# Patient Record
Sex: Male | Born: 1950 | Race: White | Hispanic: No | Marital: Married | Smoking: Former smoker
Health system: Southern US, Community
[De-identification: ages and names within clinical notes are randomized; demographics above are authoritative.]

## PROBLEM LIST (undated history)

## (undated) DIAGNOSIS — E785 Hyperlipidemia, unspecified: Secondary | ICD-10-CM

## (undated) DIAGNOSIS — G709 Myoneural disorder, unspecified: Secondary | ICD-10-CM

## (undated) DIAGNOSIS — L570 Actinic keratosis: Secondary | ICD-10-CM

## (undated) DIAGNOSIS — B351 Tinea unguium: Secondary | ICD-10-CM

## (undated) DIAGNOSIS — A6000 Herpesviral infection of urogenital system, unspecified: Secondary | ICD-10-CM

## (undated) DIAGNOSIS — M1710 Unilateral primary osteoarthritis, unspecified knee: Secondary | ICD-10-CM

## (undated) DIAGNOSIS — Z0000 Encounter for general adult medical examination without abnormal findings: Secondary | ICD-10-CM

## (undated) DIAGNOSIS — M179 Osteoarthritis of knee, unspecified: Secondary | ICD-10-CM

## (undated) DIAGNOSIS — M1990 Unspecified osteoarthritis, unspecified site: Secondary | ICD-10-CM

## (undated) DIAGNOSIS — M25569 Pain in unspecified knee: Secondary | ICD-10-CM

## (undated) DIAGNOSIS — L600 Ingrowing nail: Secondary | ICD-10-CM

## (undated) DIAGNOSIS — R011 Cardiac murmur, unspecified: Secondary | ICD-10-CM

## (undated) DIAGNOSIS — Z860100 Personal history of colon polyps, unspecified: Secondary | ICD-10-CM

## (undated) DIAGNOSIS — D485 Neoplasm of uncertain behavior of skin: Secondary | ICD-10-CM

## (undated) DIAGNOSIS — Z01818 Encounter for other preprocedural examination: Secondary | ICD-10-CM

## (undated) DIAGNOSIS — J019 Acute sinusitis, unspecified: Secondary | ICD-10-CM

## (undated) DIAGNOSIS — K635 Polyp of colon: Secondary | ICD-10-CM

## (undated) DIAGNOSIS — L242 Irritant contact dermatitis due to solvents: Secondary | ICD-10-CM

## (undated) DIAGNOSIS — M79609 Pain in unspecified limb: Secondary | ICD-10-CM

## (undated) DIAGNOSIS — R0609 Other forms of dyspnea: Secondary | ICD-10-CM

## (undated) DIAGNOSIS — T7840XA Allergy, unspecified, initial encounter: Secondary | ICD-10-CM

## (undated) DIAGNOSIS — K219 Gastro-esophageal reflux disease without esophagitis: Secondary | ICD-10-CM

## (undated) HISTORY — DX: Hyperlipidemia, unspecified: E78.5

## (undated) HISTORY — DX: Pain in unspecified knee: M25.569

## (undated) HISTORY — DX: Unilateral primary osteoarthritis, unspecified knee: M17.10

## (undated) HISTORY — DX: Neoplasm of uncertain behavior of skin: D48.5

## (undated) HISTORY — PX: KNEE ARTHROSCOPY: SUR90

## (undated) HISTORY — DX: Irritant contact dermatitis due to solvents: L24.2

## (undated) HISTORY — DX: Ingrowing nail: L60.0

## (undated) HISTORY — DX: Personal history of colonic polyps: Z86.010

## (undated) HISTORY — DX: Other forms of dyspnea: R06.09

## (undated) HISTORY — DX: Cardiac murmur, unspecified: R01.1

## (undated) HISTORY — PX: WISDOM TOOTH EXTRACTION: SHX21

## (undated) HISTORY — DX: Unspecified osteoarthritis, unspecified site: M19.90

## (undated) HISTORY — DX: Encounter for other preprocedural examination: Z01.818

## (undated) HISTORY — DX: Acute sinusitis, unspecified: J01.90

## (undated) HISTORY — DX: Osteoarthritis of knee, unspecified: M17.9

## (undated) HISTORY — DX: Gastro-esophageal reflux disease without esophagitis: K21.9

## (undated) HISTORY — DX: Allergy, unspecified, initial encounter: T78.40XA

## (undated) HISTORY — DX: Encounter for general adult medical examination without abnormal findings: Z00.00

## (undated) HISTORY — DX: Tinea unguium: B35.1

## (undated) HISTORY — DX: Pain in unspecified limb: M79.609

## (undated) HISTORY — DX: Herpesviral infection of urogenital system, unspecified: A60.00

## (undated) HISTORY — DX: Polyp of colon: K63.5

## (undated) HISTORY — DX: Personal history of colon polyps, unspecified: Z86.0100

## (undated) HISTORY — DX: Actinic keratosis: L57.0

---

## 2003-07-23 ENCOUNTER — Encounter: Payer: Self-pay | Admitting: Internal Medicine

## 2003-07-23 ENCOUNTER — Inpatient Hospital Stay (HOSPITAL_COMMUNITY): Admission: EM | Admit: 2003-07-23 | Discharge: 2003-07-24 | Payer: Self-pay

## 2003-07-24 ENCOUNTER — Encounter: Payer: Self-pay | Admitting: Cardiology

## 2003-11-15 HISTORY — PX: FUNCTIONAL ENDOSCOPIC SINUS SURGERY: SUR616

## 2004-10-22 ENCOUNTER — Ambulatory Visit: Payer: Self-pay | Admitting: Internal Medicine

## 2004-10-29 ENCOUNTER — Ambulatory Visit: Payer: Self-pay | Admitting: Internal Medicine

## 2004-12-10 ENCOUNTER — Ambulatory Visit: Payer: Self-pay | Admitting: Gastroenterology

## 2004-12-15 ENCOUNTER — Ambulatory Visit: Payer: Self-pay | Admitting: Gastroenterology

## 2005-08-26 ENCOUNTER — Ambulatory Visit: Payer: Self-pay | Admitting: Internal Medicine

## 2006-01-09 ENCOUNTER — Ambulatory Visit: Payer: Self-pay | Admitting: Internal Medicine

## 2006-01-13 ENCOUNTER — Ambulatory Visit: Payer: Self-pay | Admitting: Internal Medicine

## 2006-02-08 ENCOUNTER — Encounter: Admission: RE | Admit: 2006-02-08 | Discharge: 2006-02-08 | Payer: Self-pay | Admitting: General Surgery

## 2007-02-05 ENCOUNTER — Ambulatory Visit: Payer: Self-pay | Admitting: Internal Medicine

## 2007-02-05 LAB — CONVERTED CEMR LAB
ALT: 38 units/L (ref 0–40)
AST: 27 units/L (ref 0–37)
Albumin: 3.9 g/dL (ref 3.5–5.2)
Alkaline Phosphatase: 52 units/L (ref 39–117)
Bilirubin, Direct: 0.1 mg/dL (ref 0.0–0.3)
Cholesterol: 227 mg/dL (ref 0–200)
Crystals: NEGATIVE
Direct LDL: 120.9 mg/dL
Eosinophils Absolute: 0.3 10*3/uL (ref 0.0–0.6)
Eosinophils Relative: 3.1 % (ref 0.0–5.0)
GFR calc Af Amer: 152 mL/min
GFR calc non Af Amer: 125 mL/min
HDL: 37.3 mg/dL — ABNORMAL LOW (ref >39.0)
Leukocytes, UA: NEGATIVE
Monocytes Relative: 15.2 % — ABNORMAL HIGH (ref 3.0–11.0)
Mucus, UA: NEGATIVE
Neutro Abs: 4.6 10*3/uL (ref 1.4–7.7)
Neutrophils Relative %: 56.6 % (ref 43.0–77.0)
Nitrite: NEGATIVE
Sodium: 144 meq/L (ref 135–145)
Specific Gravity, Urine: 1.02 (ref 1.000–1.03)
TSH: 3.8 microintl units/mL (ref 0.35–5.50)
Total Bilirubin: 0.7 mg/dL (ref 0.3–1.2)
Total Protein: 7.2 g/dL (ref 6.0–8.3)
VLDL: 62 mg/dL — ABNORMAL HIGH (ref 0–40)

## 2007-02-09 ENCOUNTER — Ambulatory Visit: Payer: Self-pay | Admitting: Internal Medicine

## 2007-04-27 ENCOUNTER — Ambulatory Visit: Payer: Self-pay | Admitting: Internal Medicine

## 2007-04-27 LAB — CONVERTED CEMR LAB
BUN: 12 mg/dL (ref 6–23)
CO2: 26 meq/L (ref 19–32)
Creatinine, Ser: 0.8 mg/dL (ref 0.4–1.5)
GFR calc non Af Amer: 107 mL/min
Hgb A1c MFr Bld: 5.4 % (ref 4.6–6.0)
Potassium: 4.2 meq/L (ref 3.5–5.1)
VLDL: 19 mg/dL (ref 0–40)

## 2007-05-04 ENCOUNTER — Ambulatory Visit: Payer: Self-pay | Admitting: Internal Medicine

## 2007-08-28 ENCOUNTER — Encounter: Payer: Self-pay | Admitting: Endocrinology

## 2007-12-13 ENCOUNTER — Encounter (INDEPENDENT_AMBULATORY_CARE_PROVIDER_SITE_OTHER): Payer: Self-pay | Admitting: *Deleted

## 2008-01-25 ENCOUNTER — Encounter: Payer: Self-pay | Admitting: Internal Medicine

## 2008-02-29 ENCOUNTER — Encounter: Payer: Self-pay | Admitting: Internal Medicine

## 2008-05-02 ENCOUNTER — Ambulatory Visit: Payer: Self-pay | Admitting: Internal Medicine

## 2008-05-03 LAB — CONVERTED CEMR LAB
ALT: 25 units/L (ref 0–53)
Alkaline Phosphatase: 44 units/L (ref 39–117)
Basophils Relative: 1.6 % — ABNORMAL HIGH (ref 0.0–1.0)
Bilirubin, Direct: 0.1 mg/dL (ref 0.0–0.3)
Calcium: 9 mg/dL (ref 8.4–10.5)
Chloride: 108 meq/L (ref 96–112)
Creatinine, Ser: 0.8 mg/dL (ref 0.4–1.5)
Eosinophils Relative: 3.7 % (ref 0.0–5.0)
GFR calc Af Amer: 130 mL/min
Glucose, Bld: 118 mg/dL — ABNORMAL HIGH (ref 70–99)
Hgb A1c MFr Bld: 5.6 % (ref 4.6–6.0)
MCHC: 34.3 g/dL (ref 30.0–36.0)
MCV: 94.5 fL (ref 78.0–100.0)
Monocytes Relative: 12.4 % — ABNORMAL HIGH (ref 3.0–12.0)
Platelets: 229 10*3/uL (ref 150–400)
Potassium: 3.7 meq/L (ref 3.5–5.1)
RDW: 12.6 % (ref 11.5–14.6)
TSH: 3.43 microintl units/mL (ref 0.35–5.50)
Total Bilirubin: 0.9 mg/dL (ref 0.3–1.2)
Total CHOL/HDL Ratio: 5.8
VLDL: 23 mg/dL (ref 0–40)

## 2008-05-09 ENCOUNTER — Ambulatory Visit: Payer: Self-pay | Admitting: Internal Medicine

## 2008-05-09 DIAGNOSIS — A6000 Herpesviral infection of urogenital system, unspecified: Secondary | ICD-10-CM | POA: Insufficient documentation

## 2008-05-09 DIAGNOSIS — Z860100 Personal history of colon polyps, unspecified: Secondary | ICD-10-CM

## 2008-05-09 DIAGNOSIS — K219 Gastro-esophageal reflux disease without esophagitis: Secondary | ICD-10-CM

## 2008-05-09 DIAGNOSIS — B351 Tinea unguium: Secondary | ICD-10-CM

## 2008-05-09 HISTORY — DX: Gastro-esophageal reflux disease without esophagitis: K21.9

## 2008-05-09 HISTORY — DX: Herpesviral infection of urogenital system, unspecified: A60.00

## 2008-05-09 HISTORY — DX: Personal history of colon polyps, unspecified: Z86.0100

## 2008-08-07 ENCOUNTER — Encounter: Payer: Self-pay | Admitting: Internal Medicine

## 2008-11-21 ENCOUNTER — Ambulatory Visit: Payer: Self-pay | Admitting: Internal Medicine

## 2008-11-21 DIAGNOSIS — M19042 Primary osteoarthritis, left hand: Secondary | ICD-10-CM

## 2008-11-21 DIAGNOSIS — M19041 Primary osteoarthritis, right hand: Secondary | ICD-10-CM

## 2008-11-21 HISTORY — DX: Primary osteoarthritis, right hand: M19.041

## 2008-11-21 HISTORY — DX: Primary osteoarthritis, left hand: M19.042

## 2009-04-14 ENCOUNTER — Encounter: Payer: Self-pay | Admitting: Internal Medicine

## 2009-05-21 ENCOUNTER — Ambulatory Visit: Payer: Self-pay | Admitting: Internal Medicine

## 2009-05-21 LAB — CONVERTED CEMR LAB
ALT: 37 units/L (ref 0–53)
AST: 27 units/L (ref 0–37)
Alkaline Phosphatase: 51 units/L (ref 39–117)
Basophils Relative: 0 % (ref 0.0–3.0)
Bilirubin Urine: NEGATIVE
Bilirubin, Direct: 0.1 mg/dL (ref 0.0–0.3)
CO2: 26 meq/L (ref 19–32)
Calcium: 9.1 mg/dL (ref 8.4–10.5)
Chloride: 107 meq/L (ref 96–112)
Eosinophils Relative: 5 % (ref 0.0–5.0)
Glucose, Bld: 114 mg/dL — ABNORMAL HIGH (ref 70–99)
HDL: 41.2 mg/dL (ref >39.00)
Ketones, ur: NEGATIVE mg/dL
Leukocytes, UA: NEGATIVE
Lymphocytes Relative: 43.9 % (ref 12.0–46.0)
MCV: 93.2 fL (ref 78.0–100.0)
Monocytes Absolute: 0.5 10*3/uL (ref 0.1–1.0)
Monocytes Relative: 7.9 % (ref 3.0–12.0)
Neutrophils Relative %: 43.2 % (ref 43.0–77.0)
PSA: 0.99 ng/mL (ref 0.10–4.00)
Platelets: 204 10*3/uL (ref 150.0–400.0)
RBC: 4.75 M/uL (ref 4.22–5.81)
Sodium: 140 meq/L (ref 135–145)
Specific Gravity, Urine: <=1.005 (ref 1.000–1.030)
TSH: 3.22 microintl units/mL (ref 0.35–5.50)
Total Bilirubin: 1 mg/dL (ref 0.3–1.2)
Total CHOL/HDL Ratio: 6
Total Protein, Urine: NEGATIVE mg/dL
Triglycerides: 254 mg/dL — ABNORMAL HIGH (ref 0.0–149.0)
Vit D, 25-Hydroxy: 37 ng/mL (ref 30–89)
WBC: 6.1 10*3/uL (ref 4.5–10.5)
pH: 5.5 (ref 5.0–8.0)

## 2009-05-22 ENCOUNTER — Ambulatory Visit: Payer: Self-pay | Admitting: Internal Medicine

## 2009-11-14 HISTORY — PX: UMBILICAL HERNIA REPAIR: SHX196

## 2009-12-21 ENCOUNTER — Ambulatory Visit: Payer: Self-pay | Admitting: Internal Medicine

## 2009-12-21 DIAGNOSIS — M25569 Pain in unspecified knee: Secondary | ICD-10-CM | POA: Insufficient documentation

## 2010-01-28 ENCOUNTER — Ambulatory Visit: Payer: Self-pay | Admitting: Internal Medicine

## 2010-01-28 DIAGNOSIS — L242 Irritant contact dermatitis due to solvents: Secondary | ICD-10-CM | POA: Insufficient documentation

## 2010-06-14 ENCOUNTER — Ambulatory Visit: Payer: Self-pay | Admitting: Internal Medicine

## 2010-06-14 LAB — CONVERTED CEMR LAB
AST: 22 units/L (ref 0–37)
Albumin: 4.1 g/dL (ref 3.5–5.2)
Alkaline Phosphatase: 58 units/L (ref 39–117)
Basophils Relative: 0.4 % (ref 0.0–3.0)
CO2: 27 meq/L (ref 19–32)
Calcium: 9.1 mg/dL (ref 8.4–10.5)
Direct LDL: 104.7 mg/dL
GFR calc non Af Amer: 97.44 mL/min (ref >60)
HCT: 42.4 % (ref 39.0–52.0)
Hemoglobin: 14.6 g/dL (ref 13.0–17.0)
Lymphocytes Relative: 22.3 % (ref 12.0–46.0)
Lymphs Abs: 2.2 10*3/uL (ref 0.7–4.0)
MCHC: 34.5 g/dL (ref 30.0–36.0)
Monocytes Relative: 10.4 % (ref 3.0–12.0)
Neutro Abs: 6.4 10*3/uL (ref 1.4–7.7)
Nitrite: NEGATIVE
PSA: 0.91 ng/mL (ref 0.10–4.00)
Potassium: 4.6 meq/L (ref 3.5–5.1)
RBC: 4.49 M/uL (ref 4.22–5.81)
Sodium: 140 meq/L (ref 135–145)
Specific Gravity, Urine: 1.015 (ref 1.000–1.030)
Total Protein, Urine: NEGATIVE mg/dL
Total Protein: 7 g/dL (ref 6.0–8.3)
pH: 5.5 (ref 5.0–8.0)

## 2010-06-21 ENCOUNTER — Encounter: Payer: Self-pay | Admitting: Internal Medicine

## 2010-06-21 ENCOUNTER — Ambulatory Visit: Payer: Self-pay | Admitting: Internal Medicine

## 2010-06-21 DIAGNOSIS — J019 Acute sinusitis, unspecified: Secondary | ICD-10-CM | POA: Insufficient documentation

## 2010-06-21 DIAGNOSIS — D485 Neoplasm of uncertain behavior of skin: Secondary | ICD-10-CM | POA: Insufficient documentation

## 2010-06-21 DIAGNOSIS — L570 Actinic keratosis: Secondary | ICD-10-CM | POA: Insufficient documentation

## 2010-06-21 HISTORY — DX: Acute sinusitis, unspecified: J01.90

## 2010-08-06 ENCOUNTER — Ambulatory Visit: Payer: Self-pay | Admitting: Internal Medicine

## 2010-11-22 ENCOUNTER — Telehealth: Payer: Self-pay | Admitting: Internal Medicine

## 2010-12-14 NOTE — Miscellaneous (Signed)
Summary: Skin Bx/[facility]  Skin Bx/[facility]   Imported By: Sherian Rein 08/10/2010 08:58:14  _____________________________________________________________________  External Attachment:    Type:   Image     Comment:   External Document

## 2010-12-14 NOTE — Assessment & Plan Note (Signed)
Summary: PHYSICAL--STC   Vital Signs:  Patient profile:   60 year old male Height:      70 inches Weight:      219 pounds BMI:     31.54 O2 Sat:      96 % on Room air Temp:     98.1 degrees F oral Pulse rate:   81 / minute Pulse rhythm:   regular Resp:     16 per minute BP sitting:   130 / 76  (left arm) Cuff size:   regular  Vitals Entered By: Lanier Prude, CMA(AAMA) (June 21, 2010 3:01 PM)  O2 Flow:  Room air  Primary Care Provider:  Tresa Garter MD   History of Present Illness: The patient presents for a preventive health examination  C/o sinus inf and pain  Current Medications (verified): 1)  Nasonex 50 Mcg/act  Susp (Mometasone Furoate) .... Use As Directed 2)  Celebrex 200 Mg  Caps (Celecoxib) .[name].. 1-2 Once Daily  With Food 3)  Aspirin 81 Mg  Tbec (Aspirin) .... One By Mouth Every Day 4)  Vitamin D3 1000 Unit  Tabs (Cholecalciferol) .... 2 By Mouth Daily 5)  Tramadol Hcl 50 Mg  Tabs (Tramadol Hcl) .[name].. 1-2 By Mouth Two Times A Day As Needed Pain 6)  Valtrex 500 Mg Tabs (Valacyclovir Hcl) .[name].. 1 By Mouth Qd 7)  Cyclobenzaprine Hcl 10 Mg Tabs (Cyclobenzaprine Hcl) .... 1/2-1 Tab By Mouth Two Times A Day As Needed Muscle Spasms 8)  Clobetasol Propionate E 0.05 % Crea (Clobetasol Prop Emollient Base) .... Apply To Aa Two Times A Day As Needed For Rash and Itching 9)  Zyrtec Allergy 10 Mg Tbdp (Cetirizine Hcl) .... 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  The patient denies anorexia, fever, weight loss, weight gain, vision loss, decreased hearing, hoarseness, chest pain, syncope, dyspnea on exertion, peripheral edema, prolonged cough, headaches, hemoptysis, abdominal pain, melena, hematochezia, severe indigestion/heartburn, hematuria, incontinence, genital sores, muscle weakness, suspicious skin lesions, transient blindness, depression, unusual weight change, abnormal bleeding, enlarged lymph nodes, angioedema, and testicular masses.         L knee pain >R  Physical Exam  General:  Well-developed,well-nourished,in no acute distress; alert,appropriate and cooperative throughout examination Head:  Normocephalic and atraumatic without obvious abnormalities. No apparent alopecia or balding. Eyes:  No corneal or conjunctival inflammation noted. EOMI. Perrla.  Ears:  External ear exam shows no significant lesions or deformities.  Otoscopic examination reveals clear canals, tympanic membranes are intact bilaterally without bulging, retraction, inflammation or discharge. Hearing is grossly normal bilaterally. Nose:  External nasal examination shows no deformity or inflammation. Nasal mucosa are pink and moist without lesions or exudates. Mouth:  Oral mucosa and oropharynx without lesions or exudates.  Teeth in good repair. Neck:  No deformities, masses, or tenderness noted. Lungs:  Normal respiratory effort, chest expands symmetrically. Lungs are clear to auscultation, no crackles or wheezes. Heart:  Normal rate and regular rhythm. S1 and S2 normal without gallop, murmur, click, rub or other extra sounds. Abdomen:  Bowel sounds positive,abdomen soft and non-tender without masses, organomegaly or hernias noted. Rectal:  No external abnormalities noted. Normal sphincter tone. No rectal masses or tenderness. G (-) Genitalia:  Testes bilaterally descended without nodularity,  tenderness or masses. No scrotal masses or lesions. No penis lesions or urethral discharge. Prostate:  no nodules and 1+ enlarged.   Msk:  B Knees are puffy and with OA deformities, tender w/ROM   no other  joint tenderness, no joint swelling, no joint warmth, no redness over joints, no joint deformities, no joint instability, and no crepitation.   Pulses:  R and L carotid,radial,femoral,dorsalis pedis and posterior tibial pulses are full and equal bilaterally Extremities:  No clubbing, cyanosis, edema, or deformity noted with normal full range of motion of all joints.   Neurologic:  No cranial nerve deficits noted. Station and gait are normal. Plantar reflexes are down-going bilaterally. DTRs are symmetrical throughout. Sensory, motor and coordinative functions appear intact. Skin:  mole 3 mm w/irreg color L thigh R hand 4 mm dry AK Cervical Nodes:  no anterior cervical adenopathy and no posterior cervical adenopathy.   Inguinal Nodes:  No significant adenopathy Psych:  Cognition and judgment appear intact. Alert and cooperative with normal attention span and concentration. No apparent delusions, illusions, hallucinations   Impression & Recommendations:  Problem # 1:  WELL ADULT EXAM (ICD-V70.0) Assessment New Health and age related issues were discussed. Available screening tests and vaccinations were discussed as well. Healthy life style including good diet and execise was discussed.  Orders: EKG w/ Interpretation (93000) He will sch a colon test in [place] as before The labs were reviewed with the patient.  Vaccinations needed discussed, he will think over  Problem # 2:  KNEE PAIN (ICD-719.46) L>R Assessment: Unchanged  His updated medication list for this problem includes:    Celebrex 200 Mg Caps (Celecoxib) .[name]... 1-2 once daily  with food    Aspirin 81 Mg Tbec (Aspirin) ..... One by mouth every day    Tramadol Hcl 50 Mg Tabs (Tramadol hcl) .[name]... 1-2 by mouth two times a day as needed  pain    Cyclobenzaprine Hcl 10 Mg Tabs (Cyclobenzaprine hcl) .[name]... 1/2-1 tab by mouth two times a day as needed muscle spasms  Problem # 3:  SINUSITIS, ACUTE (ICD-461.9) Assessment: New  His updated medication list for this problem includes:    Nasonex 50 Mcg/act Susp (Mometasone furoate) ..... Use as directed    Zithromax Z-pak 250 Mg Tabs (Azithromycin) .[name]... As dirrected  Problem # 4:  NEOPLASM OF UNCERTAIN BEHAVIOR OF SKIN (ICD-238.2) L thigh Assessment: New skin biopsy   Problem # 5:  ACTINIC KERATOSIS (ICD-702.0) R hand Assessment: New  Orders: Cryotherapy/Destruction benign or premalignant lesion (1st lesion)  (17000)  Problem # 6:  OSTEOARTHRITIS (ICD-715.90) Assessment: Unchanged  His updated medication list for this problem includes:    Celebrex 200 Mg Caps (Celecoxib) .[name]... 1-2 once daily  with food    Aspirin 81 Mg Tbec (Aspirin) ..... One by mouth every day    Tramadol Hcl 50 Mg Tabs (Tramadol hcl) .[name]... 1-2 by mouth two times a day as needed pain  Problem # 7:  GERD (ICD-530.81) Assessment: Unchanged  Complete Medication List: 1)  Nasonex 50 Mcg/act Susp (Mometasone furoate) .... Use as directed 2)  Celebrex 200 Mg Caps (Celecoxib) .[name].. 1-2 once daily  with food 3)  Aspirin 81 Mg Tbec (Aspirin) .... One by mouth every day 4)  Vitamin D3 1000 Unit Tabs (Cholecalciferol) .... 2 by mouth daily 5)  Tramadol Hcl 50 Mg Tabs (Tramadol hcl) .[name].. 1-2 by mouth two times a day as needed pain 6)  Valtrex 500 Mg Tabs (Valacyclovir hcl) .[name].. 1 by mouth qd 7)  Cyclobenzaprine Hcl 10 Mg Tabs (Cyclobenzaprine hcl) .... 1/2-1 tab by mouth two times a day as needed muscle spasms 8)  Clobetasol Propionate E 0.05 % Crea (Clobetasol prop emollient base) .... Apply to aa two times a day as needed for rash and itching 9)  Zyrtec Allergy 10 Mg Tbdp (Cetirizine hcl) .... One by mouth once daily for itching 10)  Zithromax Z-pak 250 Mg Tabs (Azithromycin) .... As dirrected 11)  Prednisone  10 Mg Tabs (Prednisone) .... Take 40mg  qd for 3 days, then 20 mg qd for 3 days, then 10mg  qd for 6 days, then stop. take pc.  Patient Instructions: 1)  Skin biopsy wiith me 1-2 months  2)  Please schedule a follow-up appointment in 6 months. Prescriptions: CYCLOBENZAPRINE HCL 10 MG TABS (CYCLOBENZAPRINE HCL) 1/2-1 tab by mouth two times a day as needed muscle spasms  #60 x 3   Entered and Authorized by:   Tresa Garter MD   Signed by:   Tresa Garter MD on 06/21/2010   Method used:   Electronically to        [facility]  [address] [phone]* (retail)       [address]       [place], [place]  [address]       Ph: [phone] or [phone]       Fax: [phone]   RxID:   [id number] VALTREX 500 MG TABS (VALACYCLOVIR HCL) 1 by mouth qd  #90 x 3   Entered and Authorized by:   Tresa Garter MD   Signed by:   Tresa Garter MD on 06/21/2010   Method used:   Electronically to        [facility]  [address] [phone]* (retail)       [address]       [place], [place]  [address]       Ph: [phone] or [phone]       Fax: [phone]   RxID:   [id number] TRAMADOL HCL 50 MG  TABS (TRAMADOL HCL) 1-2 by mouth two times a day as needed pain  #120 Tablet x 4   Entered and Authorized by:   Tresa Garter MD   Signed by:   Tresa Garter MD on 06/21/2010   Method used:   Electronically to        [facility]  [address] [phone]* (retail)       [address]       [place], [place]  [address]       Ph: [phone] or [phone]       Fax: [phone]   RxID:   [id number] CELEBREX 200 MG  CAPS (CELECOXIB) 1-2 once daily  with food  #60 x 6   Entered and Authorized by:   Tresa Garter MD   Signed by:   Tresa Garter MD on 06/21/2010   Method used:   Electronically to        [facility]  [address] [phone]* (retail)       [address]       [place], [place]  [address]       Ph: [phone] or [phone]        Fax: [phone]   RxID:   [id number] NASONEX 50 MCG/ACT  SUSP (MOMETASONE FUROATE) USE AS DIRECTED  #1 x 12   Entered and Authorized by:   Tresa Garter MD   Signed by:   Tresa Garter MD on 06/21/2010   Method used:   Electronically to        [facility]  [address] [phone]* (retail)       [address]       [place], [place]  [address]       Ph: [phone] or [phone]       Fax: [phone]   RxID:   [id number] PREDNISONE 10 MG TABS (PREDNISONE) Take 40mg  qd for 3 days, then 20 mg qd for 3 days, then 10mg  qd for 6 days, then stop. Take pc.  #24 x 1   Entered and Authorized by:   Tresa Garter MD   Signed by:   Tresa Garter MD on 06/21/2010   Method used:   Electronically to        [facility]  [address] [phone]* (retail)       [address]       [place], [place]  [address]       Ph: [phone] or [phone]       Fax: [phone]   RxID:   [id number] ZITHROMAX Z-PAK 250 MG TABS (AZITHROMYCIN) as dirrected  #1 x 0   Entered and Authorized by:   Tresa Garter MD   Signed by:   Tresa Garter MD on 06/21/2010   Method used:   Electronically to        [facility]  [address] [phone]* (retail)       [address]       [place], [place]  [address]       Ph: [phone] or [phone]       Fax: [phone]   RxID:   [id number]

## 2010-12-14 NOTE — Assessment & Plan Note (Signed)
Summary: rash on arms,hands/plot pt/cd   Vital Signs:  Patient profile:   60 year old male Height:      70 inches (177.80 cm) Weight:      220 pounds (100.00 kg) O2 Sat:      96 % on Room air Temp:     97.1 degrees F (36.17 degrees C) oral Pulse rate:   92 / minute Pulse rhythm:   regular Resp:     16 per minute BP sitting:   114 / 68  (left arm) Cuff size:   large  Vitals Entered By: Rock Nephew CMA (January 28, 2010 8:16 AM) Taken by Sydell Axon SMA  O2 Flow:  Room air CC: Pt c/o rash on arms and hands   Primary Care Provider:  Tresa Garter MD  CC:  Pt c/o rash on arms and hands.  History of Present Illness: New to me he complains of chronic but worsening itchy rash on both hands and forearms that is in an area that matches with the location of a pair of Nitrile gloves that he wears at work.  Current Medications (verified): 1)  Nasonex 50 Mcg/act  Susp (Mometasone Furoate) .... Use As Directed 2)  Celebrex 200 Mg  Caps (Celecoxib) .[name].. 1-2 Once Daily  With Food 3)  Aspirin 81 Mg  Tbec (Aspirin) .... One By Mouth Every Day 4)  Vitamin D3 1000 Unit  Tabs (Cholecalciferol) .... 2 By Mouth Daily 5)  Tramadol Hcl 50 Mg  Tabs (Tramadol Hcl) .[name].. 1-2 By Mouth Two Times A Day As Needed Pain 6)  Valtrex 500 Mg Tabs (Valacyclovir Hcl) .[name].. 1 By Mouth Qd 7)  Prednisone 10 Mg  Tabs (Prednisone) .... Take 40mg  Qd For 3 Days, Then 20 Mg Qd For 3 Days, Then 10mg  Qd For 6 Days, Then Stop. Take Pc. 8)  Cyclobenzaprine Hcl 10 Mg Tabs (Cyclobenzaprine Hcl) .... 1/2-1 Tab By Mouth Two Times A Day As Needed Muscle Spasms  Allergies (verified): No Known Drug Allergies  Past History:  Past Medical History: Reviewed history from 11/21/2008 and no changes required. 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 L knee arthrosc.  Family  History: Reviewed history from 05/09/2008 and no changes required. Family History of CAD Male 1st degree relative <50   Social History: Reviewed history from 05/09/2008 and no changes required. 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   Derm:  Complains of dryness, itching, and rash; denies changes in color of skin, changes in nail beds, flushing, hair loss, insect bite(s), lesion(s), and poor wound healing.  Physical Exam  General:  Well-developed,well-nourished,in no acute distress; alert,appropriate and cooperative throughout examination Eyes:  No corneal or conjunctival inflammation noted. EOMI. Perrla. Funduscopic exam benign, without hemorrhages, exudates or papilledema. Vision grossly normal. Mouth:  Oral mucosa and oropharynx without lesions or exudates.  Teeth in good repair. Neck:  No deformities, masses, or tenderness noted. Lungs:  Normal respiratory effort, chest expands symmetrically. Lungs are clear to auscultation, no crackles or wheezes. Heart:  Normal rate and regular rhythm. S1 and S2 normal without gallop, murmur, click, rub or other extra sounds. Abdomen:  Bowel sounds positive,abdomen soft and non-tender without masses, organomegaly or hernias noted. Msk:  normal ROM, no joint tenderness, no joint swelling, no joint warmth, no redness over joints, no joint deformities, no joint instability, and no crepitation.   Pulses:  R and L carotid,radial,femoral,dorsalis pedis and posterior tibial pulses are full and equal bilaterally Extremities:  No clubbing, cyanosis, edema, or deformity noted with normal full range of motion of all joints.   Neurologic:  No cranial nerve deficits noted. Station and gait are normal. Plantar reflexes are down-going bilaterally. DTRs are  symmetrical throughout. Sensory, motor and coordinative functions appear intact. Skin:  he has a rash that is symmetrical on both hands and forearms that matches the distribution of a pair of gloves that he wears at work. there is lichenification, erythema, scale, and xerosis. there is no exudate, induration, streaking, or fluctuance. Cervical Nodes:  no anterior cervical adenopathy and no posterior cervical adenopathy.   Psych:  Cognition and judgment appear intact. Alert and cooperative with normal attention span and concentration. No apparent delusions, illusions, hallucinations   Impression & Recommendations:  Problem # 1:  CONTACT DERMATITIS DUE TO SOLVENTS (ICD-692.2) Assessment New change gloves to prevent re-exposue The following medications were removed from the medication list:    Prednisone 10 Mg Tabs (Prednisone) .[name]... Take 40mg  qd for 3 days, then 20 mg qd for 3 days, then 10mg  qd for 6 days, then stop. take pc. His updated medication list for this problem includes:    Clobetasol Propionate E 0.05 % Crea (Clobetasol prop emollient base) .[name]... Apply to aa two times a day as needed for rash and itching    Zyrtec Allergy 10 Mg Tbdp (Cetirizine hcl) ..... One by mouth once daily for itching  Orders: Admin of Therapeutic Inj  intramuscular or subcutaneous ([address]109) Depo- Medrol 40mg  (J1030) Depo- Medrol 80mg  (J1040)  Discussed avoidance of triggers and symptomatic treatment.   Complete Medication List: 1)  Nasonex 50 Mcg/act Susp (Mometasone furoate) .... Use as directed 2)  Celebrex 200 Mg Caps (Celecoxib) .[name].. 1-2 once daily  with food 3)  Aspirin 81 Mg Tbec (Aspirin) .... One by mouth every day 4)  Vitamin D3 1000 Unit Tabs (Cholecalciferol) .... 2 by mouth daily 5)  Tramadol Hcl 50 Mg Tabs (Tramadol hcl) .[name].. 1-2 by mouth two times a day as needed pain 6)  Valtrex 500 Mg Tabs (Valacyclovir hcl) .[name].. 1 by mouth qd 7)  Cyclobenzaprine Hcl 10 Mg Tabs (Cyclobenzaprine hcl) ....  1/2-1 tab by mouth two times a day as needed muscle spasms 8)  Clobetasol Propionate E 0.05 % Crea (Clobetasol prop emollient base) .... Apply to aa two times a day as needed for rash and itching 9)  Zyrtec Allergy 10 Mg Tbdp (Cetirizine hcl) .... One by mouth once daily for itching  Other Orders: Tdap => 10yrs IM (60454) Admin 1st Vaccine (09811)  Patient Instructions: 1)  Please talk to your employer about geeting a pair of gloves that you are not aalregic to. 2)  Please schedule a follow-up appointment in 1 month. Prescriptions: ZYRTEC ALLERGY 10 MG TBDP (CETIRIZINE HCL) One by mouth once daily for itching  #30 x 11   Entered and Authorized by:   Etta Grandchild MD   Signed by:   Etta Grandchild MD on 01/28/2010   Method used:   [facility]ron[facility] to        [facility]  [address] [phone]* (retail)       [address] [facility] [facility]  [place], [place]  [address]       Ph: [phone] or [phone]       Fax: [phone]   RxID:   [id number] CLOBETASOL PROPIONATE E 0.05 % CREA (CLOBETASOL PROP EMOLLIENT BASE) Apply to AA two times a day as needed for rash and itching  #60 gms x 11   Entered and Authorized by:   Etta Grandchild MD   Signed by:   Etta Grandchild MD on 01/28/2010   Method used:   [facility]        [facility]  [address] [phone]* (retail)       [address]       [place], [place]  [address]       Ph: [phone] or [phone]       Fax: [phone]   RxID:   [id number]    Immunizations Administered:  Tetanus Vaccine:    Vaccine Type: Tdap    Site: right deltoid    Mfr: GlaxoSmithKline    Dose: 0.5 ml    Route: IM    Given by: Rock Nephew CMA    Exp. Date: 09/14/2012    Lot #: [id number]    VIS given: 10/02/07 version given January 28, 2010.  EXP:02/06/2012 LOT#[id number]  Medication Administration  Injection # 1:    Medication: Depo- Medrol 80mg     Diagnosis: CONTACT DERMATITIS DUE TO SOLVENTS (ICD-692.2)    Route: IM    Site: L  deltoid    Exp Date: 02/06/2012    Lot #: [id number]    Mfr: [email or web address]    Patient tolerated injection without complications    Given by: Rock Nephew CMA (January 28, 2010 8:35 AM)  Injection # 2:    Medication: Depo- Medrol 40mg     Diagnosis: CONTACT DERMATITIS DUE TO SOLVENTS (ICD-692.2)    Route: IM    Site: L deltoid    Exp Date: 02/06/2012    Lot #: [id number]    Mfr: PFIZER    Patient tolerated injection without complications    Given by: Rock Nephew CMA (January 28, 2010 8:36 AM)  Orders Added: 1)  Tdap => 100yrs IM [90715] 2)  Admin 1st Vaccine [90471] 3)  Admin of Therapeutic Inj  intramuscular or subcutaneous [96372] 4)  Depo- Medrol 40mg  [J1030] 5)  Depo- Medrol 80mg  [J1040] 6)  Est. Patient Level IV [99371]

## 2010-12-14 NOTE — Assessment & Plan Note (Signed)
Summary: 6 mos f/u $50/cd   Vital Signs:  Patient profile:   60 year old male Weight:      222 pounds Temp:     98.3 degrees F oral Pulse rate:   84 / minute BP sitting:   124 / 80  (left arm)  Vitals Entered By: Tora Perches (December 21, 2009 2:29 PM) CC: f/u Is Patient Diabetic? No   CC:  f/u.  History of Present Illness: F/u OA, knee pain  Preventive Screening-Counseling & Management  Alcohol-Tobacco     Smoking Status: current  Current Medications (verified): 1)  Nasonex 50 Mcg/act  Susp (Mometasone Furoate) .... Use As Directed 2)  Celebrex 200 Mg  Caps (Celecoxib) .[name].. 1-2 Once Daily  With Food 3)  Aspirin 81 Mg  Tbec (Aspirin) .... One By Mouth Every Day 4)  Vitamin D3 1000 Unit  Tabs (Cholecalciferol) .... 2 By Mouth Daily 5)  Tramadol Hcl 50 Mg  Tabs (Tramadol Hcl) .[name].. 1-2 By Mouth Two Times A Day As Needed Pain 6)  Valtrex 500 Mg Tabs (Valacyclovir Hcl) .[name].. 1 By Mouth Qd 7)  Prednisone 10 Mg  Tabs (Prednisone) .... Take 40mg  Qd For 3 Days, Then 20 Mg Qd For 3 Days, Then 10mg  Qd For 6 Days, Then Stop. Take Pc. 8)  Bromax D 6-19 Mg Xr12h-Tab (Brompheniramine-Phenylephrine) .... Two Times A Day 9)  Methylprednisolone 4 Mg Tabs (Methylprednisolone) .... As Directed 10)  Azithromycin 250 Mg Tabs (Azithromycin) .... As Directed  Allergies (verified): No Known Drug Allergies  Past History:  Past Medical History: Last updated: 11/21/2008 OA Dr Penni Bombard - knees L>R Colonic polyps, hx of - colonosc in [place] GERD Osteoarthritis  Past Surgical History: Last updated: 11/21/2008 L knee arthrosc.  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 Nasal mucosa are pink and moist without lesions or exudates. Mouth:  Oral mucosa and oropharynx without lesions or exudates.  Teeth in good repair. Lungs:  Normal respiratory effort, chest expands symmetrically. Lungs are clear to auscultation, no crackles or wheezes. Heart:  Normal rate and regular rhythm. S1 and S2 normal without gallop, murmur, click, rub or other extra sounds. Abdomen:  Bowel sounds positive,abdomen soft and non-tender without masses, organomegaly or hernias noted. Msk:  L knee tender w/ROM Extremities:  No clubbing, cyanosis, edema, or deformity noted with normal full range of motion of all joints.   Neurologic:  No cranial nerve deficits noted. Station and gait are normal. Plantar reflexes are down-going bilaterally. DTRs are symmetrical throughout. Sensory, motor and coordinative functions appear intact. Skin:  Intact without suspicious lesions or rashes Psych:  Cognition and judgment appear intact. Alert and cooperative with normal attention span and concentration. No apparent delusions, illusions, hallucinations   Impression & Recommendations:  Problem # 1:  OSTEOARTHRITIS (ICD-715.90) Assessment Unchanged  His updated medication list for this problem includes:    Celebrex 200 Mg Caps (Celecoxib) .[name]... 1-2 once daily  with food    Aspirin 81 Mg Tbec (Aspirin) ..... One by mouth every day    Tramadol Hcl 50 Mg Tabs (Tramadol hcl) .[name]... 1-2 by mouth two times a day as needed pain  Problem # 2:  KNEE PAIN (ICD-719.46) Assessment: Unchanged  His updated medication list for this problem includes:    Celebrex 200 Mg Caps (Celecoxib) .[name]... 1-2 once daily  with food    Aspirin 81 Mg Tbec (Aspirin) ..... One by mouth every day    Tramadol Hcl 50 Mg Tabs (Tramadol hcl) .[name]... 1-2 by mouth two times a day as needed pain    Cyclobenzaprine Hcl 10 Mg Tabs (Cyclobenzaprine hcl) .[name]... 1/2-1 tab by mouth two times a day as  needed muscle spasms  Problem # 3:  GERD (ICD-530.81) Assessment: Improved  Complete Medication List: 1)  Nasonex 50 Mcg/act Susp (Mometasone furoate) .... Use as directed 2)  Celebrex 200 Mg Caps (Celecoxib) .[name].. 1-2 once daily  with food 3)  Aspirin 81 Mg Tbec (Aspirin) .... One by mouth every day 4)  Vitamin D3 1000 Unit Tabs (Cholecalciferol) .... 2 by mouth daily 5)  Tramadol Hcl 50 Mg Tabs (Tramadol hcl) .[name].. 1-2 by mouth two times a day as needed pain 6)  Valtrex 500 Mg Tabs (Valacyclovir hcl) .[name].. 1 by mouth qd 7)  Prednisone 10 Mg Tabs (Prednisone) .... Take 40mg  qd for 3 days, then 20 mg qd for 3 days, then 10mg  qd for 6 days, then stop. take pc. 8)  Azithromycin 250 Mg Tabs (Azithromycin) .... As directed 9)  Meclizine Hcl 12.5 Mg Tabs (Meclizine hcl) .[name].. 1-2 by mouth two times a day as needed vertigo 10)  Cyclobenzaprine Hcl 10 Mg Tabs (Cyclobenzaprine hcl) .... 1/2-1 tab by mouth two times a day as needed muscle spasms  Patient Instructions: 1)  Use the Sinus rinse as needed 2)  Please schedule a follow-up appointment in 6 months well w/labs. Prescriptions: CYCLOBENZAPRINE HCL 10 MG TABS (CYCLOBENZAPRINE HCL) 1/2-1 tab by mouth two times a day as needed muscle spasms  #60 x 3   Entered and Authorized by:   Tresa Garter MD   Signed by:   Tresa Garter MD on 12/21/2009   Method used:   Electronically to        [facility]  [address] [phone]* (retail)       [address]       [place], [place]  [address]       Ph: [phone] or [phone]       Fax: [phone]   RxID:   [id number] VALTREX 500 MG TABS (VALACYCLOVIR HCL) 1 by mouth qd  #90 x 3   Entered and Authorized by:   Tresa Garter MD   Signed by:   Tresa Garter MD on 12/21/2009   Method used:   Electronically to        [facility]  [address] [phone]* (retail)       [address]       [place], [place]  [address]       Ph: [phone] or [phone]       Fax:  [phone]   RxID:   [id number] TRAMADOL HCL 50 MG  TABS (TRAMADOL HCL) 1-2 by mouth two times a day as needed pain  #120 Tablet x 4   Entered and Authorized by:   Tresa Garter MD   Signed by:   Tresa Garter MD on 12/21/2009   Method used:   Electronically to        [facility]  [address] [phone]* (retail)       [address]  [place], [place]  [address]       Ph: [phone] or [phone]       Fax: [phone]   RxID:   [id number] CELEBREX 200 MG  CAPS (CELECOXIB) 1-2 once daily  with food  #60 x 6   Entered and Authorized by:   Tresa Garter MD   Signed by:   Tresa Garter MD on 12/21/2009   Method used:   Electronically to        [facility]  [address] [phone]* (retail)       [address]       [place], [place]  [address]       Ph: [phone] or [phone]       Fax: [phone]   RxID:   [id number] NASONEX 50 MCG/ACT  SUSP (MOMETASONE FUROATE) USE AS DIRECTED  #1 x 12   Entered and Authorized by:   Tresa Garter MD   Signed by:   Tresa Garter MD on 12/21/2009   Method used:   Electronically to        [facility]  [address] [phone]* (retail)       [address]       [place], [place]  [address]       Ph: [phone] or [phone]       Fax: [phone]   RxID:   [id number] MECLIZINE HCL 12.5 MG TABS (MECLIZINE HCL) 1-2 by mouth two times a day as needed vertigo  #60 x 3   Entered and Authorized by:   Tresa Garter MD   Signed by:   Tresa Garter MD on 12/21/2009   Method used:   [facility]ronically to        [facility]  [address] [phone]* (retail)       [address]       [place], [place]  [address]       Ph: [phone] or [phone]       Fax: [phone]   RxID:   [id number]

## 2010-12-14 NOTE — Assessment & Plan Note (Signed)
Summary: SKIN BIOPSY-LB   Vital Signs:  Patient profile:   60 year old male Height:      70 inches Weight:      220 pounds BMI:     31.68 Temp:     98.6 degrees F oral Pulse rate:   84 / minute Pulse rhythm:   regular Resp:     16 per minute BP sitting:   130 / 80  (left arm) Cuff size:   regular  Vitals Entered By: Lanier Prude, Beverly Gust) (August 06, 2010 1:13 PM)  Procedure Note Last Tetanus: Tdap (01/28/2010)  Biopsy: The patient complains of changing mole. Consent signed: yes  Procedure # 1: shave biopsy    Size (in cm): 0.3 x 0.4    Region: anterior    Location: L upper thigh    Comment: Risks including but not limited by incomplete procedure, bleeding, infection, recurrence were discussed with the patient. Consent form was signed.     Instrument used: dermablade    Anesthesia: 0.5 ml 1% lidocaine w/epinephrine  Procedure # 2: shave biopsy    Size (in cm): 1.1 x 0.9    Region: medial    Location: central upper back    Comment: Tolerated well. Complicatons - none.     Instrument used: same    Anesthesia: 1.0 ml 1% lidocaine w/epinephrine  Cleaned and prepped with: alcohol and betadine Wound dressing: neosporin and bandaid Instructions: daily dressing changes  CC: mole removal Is Patient Diabetic? No   Primary Care Provider:  Tresa Garter MD  CC:  mole removal.  History of Present Illness: Skin bx  Current Medications (verified): 1)  Nasonex 50 Mcg/act  Susp (Mometasone Furoate) .... Use As Directed 2)  Celebrex 200 Mg  Caps (Celecoxib) .[name].. 1-2 Once Daily  With Food 3)  Aspirin 81 Mg  Tbec (Aspirin) .... One By Mouth Every Day 4)  Vitamin D3 1000 Unit  Tabs (Cholecalciferol) .... 2 By Mouth Daily 5)  Tramadol Hcl 50 Mg  Tabs (Tramadol Hcl) .[name].. 1-2 By Mouth Two Times A Day As Needed Pain 6)  Valtrex 500 Mg Tabs (Valacyclovir Hcl) .[name].. 1 By Mouth Qd 7)  Cyclobenzaprine Hcl 10 Mg Tabs (Cyclobenzaprine Hcl) .... 1/2-1 Tab By Mouth Two Times  A Day As Needed Muscle Spasms 8)  Clobetasol Propionate E 0.05 % Crea (Clobetasol Prop Emollient Base) .... Apply To Aa Two Times A Day As Needed For Rash and Itching 9)  Zyrtec Allergy 10 Mg Tbdp (Cetirizine Hcl) .... One By Mouth Once Daily For Itching 10)  Prednisone 10 Mg Tabs (Prednisone) .... Take 40mg  Qd For 3 Days, Then 20 Mg Qd For 3 Days, Then 10mg  Qd For 6 Days, Then Stop. Take Pc.  Allergies (verified): No Known Drug Allergies  Physical Exam  General:  Well-developed,well-nourished,in no acute distress; alert,appropriate and cooperative throughout examination Skin:  mole 3x4 mm w/irreg color L thigh 11x 9 mm central upper back mole   Impression & Recommendations: 1 L thigh 2 back  Complete Medication List: 1)  Nasonex 50 Mcg/act Susp (Mometasone furoate) .... Use as directed 2)  Celebrex 200 Mg Caps (Celecoxib) .[name].. 1-2 once daily  with food 3)  Aspirin 81 Mg Tbec (Aspirin) .... One by mouth every day 4)  Vitamin D3 1000 Unit Tabs (Cholecalciferol) .... 2 by mouth daily 5)  Tramadol Hcl 50 Mg Tabs (Tramadol hcl) .[name].. 1-2 by mouth two times a day as needed pain 6)  Valtrex 500 Mg Tabs (Valacyclovir  hcl) .... 1 by mouth qd 7)  Cyclobenzaprine Hcl 10 Mg Tabs (Cyclobenzaprine hcl) .... 1/2-1 tab by mouth two times a day as needed muscle spasms 8)  Clobetasol Propionate E 0.05 % Crea (Clobetasol prop emollient base) .... Apply to aa two times a day as needed for rash and itching 9)  Zyrtec Allergy 10 Mg Tbdp (Cetirizine hcl) .... One by mouth once daily for itching 10)  Prednisone 10 Mg Tabs (Prednisone) .... Take 40mg  qd for 3 days, then 20 mg qd for 3 days, then 10mg  qd for 6 days, then stop. take pc.  Other Orders: Shave Skin Lesion 1.1-2.0 cm/trunk/arm/leg ([address]) Shave Skin Lesion < 0.5 cm/trunk/arm/leg ([address])

## 2010-12-16 NOTE — Progress Notes (Signed)
Summary: RF Tramadol  Phone Note Refill Request Message from:  Pharmacy  Refills Requested: Medication #1:  TRAMADOL HCL 50 MG  TABS 1-2 by mouth two times a day as needed pain Initial call taken by: Lamar Sprinkles, CMA,  November 22, 2010 8:46 AM  Follow-up for Phone Call        ok x6 Follow-up by: Tresa Garter MD,  November 22, 2010 1:15 PM    Prescriptions: TRAMADOL HCL 50 MG  TABS (TRAMADOL HCL) 1-2 by mouth two times a day as needed pain  #120 x 5   Entered by:   Lamar Sprinkles, CMA   Authorized by:   Tresa Garter MD   Signed by:   Lamar Sprinkles, CMA on 11/22/2010   Method used:   Electronically to        [facility]  [address] [phone]* (retail)       [address]       [place], [place]  [address]       Ph: [phone] or [phone]       Fax: [phone]   RxID:   [id number]

## 2010-12-24 ENCOUNTER — Encounter: Payer: Self-pay | Admitting: Internal Medicine

## 2010-12-24 ENCOUNTER — Ambulatory Visit (INDEPENDENT_AMBULATORY_CARE_PROVIDER_SITE_OTHER): Payer: Managed Care, Other (non HMO) | Admitting: Internal Medicine

## 2010-12-24 DIAGNOSIS — M25569 Pain in unspecified knee: Secondary | ICD-10-CM

## 2010-12-24 DIAGNOSIS — M79609 Pain in unspecified limb: Secondary | ICD-10-CM | POA: Insufficient documentation

## 2010-12-24 DIAGNOSIS — M1990 Unspecified osteoarthritis, unspecified site: Secondary | ICD-10-CM
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
## 2011-01-05 NOTE — Miscellaneous (Signed)
Summary: Procedure Consent  Procedure Consent   Imported By: Lester [place] 12/28/2010 10:40:19  _____________________________________________________________________  External Attachment:    Type:   Image     Comment:   External Document

## 2011-04-01 NOTE — H&P (Signed)
NAME:  Andre, Gill                          ACCOUNT NO.:  [id number]   MEDICAL RECORD NO.:  [id number]                   PATIENT TYPE:  EMS   LOCATION:  ED                                   FACILITY:  [facility]   PHYSICIAN:  Andre L. Lendell Caprice, MD             DATE OF BIRTH:  1950-12-24   DATE OF ADMISSION:  07/23/2003  DATE OF DISCHARGE:                                HISTORY & PHYSICAL   CHIEF COMPLAINT:  Chest pressure and arm tingling.   HISTORY OF PRESENT ILLNESS:  [name] is a 60 year old white male who  presents to the emergency room with complaints of chest pressure.  This has  been episodic over the last several months, but last night, it was worse  than usual.  It lasted several hours last night and woke him up from sleep.  He had associated dyspnea and diaphoresis.  He also complains of arm and  hand tingling and numbness; sometimes this occurs with the chest pain, but  it is not always coinciding.  His cardiac risk factors are family history.  He thinks he may have high cholesterol and he was a previous smoker.  He has  no previous history of coronary artery disease and no cardiac workup.  He  was told by his primary care physician that it was his nerves.   PAST MEDICAL HISTORY:  1. Gastroesophageal reflux disease, although this pain feels different.  2. High triglycerides.  3. Osteoarthritis of the knees and shoulder.   MEDICATIONS:  Prevacid and Celebrex.   SOCIAL HISTORY:  The patient is married.  He works as a Counsellor.  He smokes  cigars occasionally; he used to smoke cigarettes.  He drinks an occasional  beer.   FAMILY HISTORY:  His father died of an MI at age 50 and his brother also had  an MI supposedly in his 73s and then again in his 62s.  His mother has  diabetes.   PAST SURGICAL HISTORY:  He has had knee surgery.   REVIEW OF SYSTEMS:  CONSTITUTIONAL:  No fevers, chills or weight loss.  HEENT:  No headache, no sore throat, no rhinorrhea.  RESPIRATORY:   No cough.  CARDIOVASCULAR:  As above.  GI:  No nausea, vomiting or diarrhea.  No  history of bleeding ulcers.  GU:  No dysuria or hematuria.  MUSCULOSKELETAL:  As above.  PSYCHIATRIC:  No depression.  NEUROLOGIC:  No seizures.  ENDOCRINE:  No diabetes.  HEMATOLOGIC:  No history of PE or DVT.   PHYSICAL EXAMINATION:  VITAL SIGNS:  On physical examination, his  temperature is 97.4, blood pressure is 143/90, pulse is 86, respiratory rate  is 20, oxygen saturation is 98% on room air.  GENERAL:  In general, the patient is an overweight white male in no acute  distress.  HEENT:  Normocephalic, atraumatic.  Pupils equal, round and reactive to  light.  Extraocular  movements are intact.  Sclerae are nonicteric.  Moist  mucous membranes.  NECK:  Neck is supple.  No lymphadenopathy.  No carotid bruits.  No jugular  venous distention.  LUNGS:  Lungs clear to auscultation bilaterally without wheezes, rhonchi or  rales.  CARDIOVASCULAR:  Regular rate and rhythm without murmurs, gallops or rubs.  ABDOMEN:  Normal bowel sounds.  Soft, nontender and nondistended.  GU AND RECTAL:  Deferred.  EXTREMITIES:  No clubbing, cyanosis, or edema.  Pedal pulses are intact.  NEUROLOGIC:  Alert and oriented.  Cranial nerves and sensory/motor exam are  intact.  His sensation is intact in his arms.  His deep tendon reflexes are  2+.  SKIN:  No rash.  PSYCHIATRIC:  Normal affect.   LABORATORIES:  His CBC is normal.  PT/PTT normal.  Complete metabolic panel  normal.  CK, MB and troponin normal.   Chest x-ray shows atelectasis.  He had a C spine series which showed  degenerative joint disease.   EKG shows normal sinus rhythm.   ASSESSMENT AND PLAN:  1. Atypical chest pain:  Given strong family history and his history of     hyperlipidemia, the patient needs to be admitted to telemetry, rule out     myocardial infarction and may need stress Cardiolite.  I will give him     Lovenox, aspirin, oxygen, Nitrol  paste.  2. Possible history of hyperlipidemia:  I will check his lipid panel in the     morning.  3. Bilateral arm and hand paresthesias.  This may be related to his cardiac     symptoms versus a radiculopathy.  I will check an magnetic resonance     imaging of the cervical spine to further evaluate this.                                               Andre L. Lendell Caprice, MD    CLS/MEDQ  D:  07/23/2003  T:  07/23/2003  Job:  [id number]

## 2011-04-01 NOTE — Assessment & Plan Note (Signed)
[facility]                           PRIMARY CARE OFFICE NOTE   NAME:[name], [name]                       MRN:          [id number]  DATE:02/15/2007                            DOB:          [birth year]    The patient is a 60 year old male who presents for a wellness  examination.   PAST MEDICAL HISTORY:  As per January 13, 2006, note.   FAMILY HISTORY:  As per January 13, 2006, note.   SOCIAL HISTORY:  As per January 13, 2006, note.  He has been working night  shift, 3 p.m. to 2 a.m.   CURRENT MEDICATION:  Celebrex 200 mg daily p.r.n.   REVIEW OF SYSTEMS:  Has had hernia surgery, no chest pain or shortness  of breath.  No syncope.  No neurologic complaints.  Developed right  elbow pain without injury.  Concerned about right toenail fungal  infection.  The rest of the 18-point review of systems is negative.   PHYSICAL EXAMINATION:  GENERAL APPEARANCE:  Looks well.  VITAL SIGNS:  Blood pressure 119/74, pulse 82, temperature 97.6, weight  226 pounds (was 216).  HEENT:  Moist mucosa.  NECK:  Supple.  No thyromegaly or bruit.  LUNGS:  Clear to auscultation and percussion, no wheezes or rales.  CARDIOVASCULAR:  S1 and S2, no murmur, no gallop.  ABDOMEN:  Soft and nontender.  No organomegaly, no mass felt.  EXTREMITIES:  Lower extremities without edema.  Onychomycosis on the  right great toenail.  Right elbow lateral epicondyle is tender.  NEUROLOGIC:  He is alert, oriented and cooperative.  Denies being  depressed.   LABORATORY DATA:  February 05, 2007, CBC normal.  Glucose 124.  Cholesterol  227, triglycerides 312, LDL 120.  TSH 3.8.  PSA 0.95.  Urinalysis  normal.   ASSESSMENT/PLAN:  1. Normal wellness examination.  Age/health related issues discussed.      Healthy lifestyle discussed.  Had a colonoscopy one year ago in      [place], [place], (was sent there by his insurance      company).  Advised to lose weight, start exercising.  Repeat  exam      in 12 months.  2. Right elbow pain.  Likely due to a tennis elbow.  He will continue      with Celebrex, ice and massage.  Will inject if no better.  3. Elevated lipids and triglycerides.  Needs to lose weight, improve      diet, fish oil.  Consider therapy.  Will recheck in three months.  4. Elevated glucose.  Again, advised to lose weight and exercise, cut      back on carbs.  Will      recheck with A1c in three months.  5. Right toenail onychomycosis.  Will use Penlac daily for about a      year.     Georgina Quint. Plotnikov, MD  Electronically Signed    AVP/MedQ  DD: 02/15/2007  DT: 02/15/2007  Job #: [id number]

## 2011-05-31 ENCOUNTER — Other Ambulatory Visit: Payer: Self-pay | Admitting: Internal Medicine

## 2011-05-31 NOTE — Telephone Encounter (Signed)
Ok to Rf? 

## 2011-06-03 ENCOUNTER — Telehealth: Payer: Self-pay | Admitting: *Deleted

## 2011-06-03 NOTE — Telephone Encounter (Signed)
Requesting refill for Tramadol to [facility] in [place].

## 2011-06-04 NOTE — Telephone Encounter (Signed)
[name], please,see when he had last ov Thx

## 2011-06-06 NOTE — Telephone Encounter (Signed)
Last OV was 12-24-10. You wanted him back in 6 mo.

## 2011-06-07 ENCOUNTER — Telehealth: Payer: Self-pay | Admitting: *Deleted

## 2011-06-07 NOTE — Telephone Encounter (Signed)
OK to fill this prescription with additional refills x5 Thank you!  

## 2011-06-07 NOTE — Telephone Encounter (Signed)
Pt is requesting refill on tramadol 50 please advise

## 2011-06-07 NOTE — Telephone Encounter (Signed)
Completed in pharm refill encounter.

## 2011-07-05 ENCOUNTER — Other Ambulatory Visit: Payer: Managed Care, Other (non HMO)

## 2011-07-08 ENCOUNTER — Other Ambulatory Visit: Payer: Self-pay | Admitting: Internal Medicine

## 2011-07-12 ENCOUNTER — Encounter: Payer: Managed Care, Other (non HMO) | Admitting: Internal Medicine

## 2011-07-19 ENCOUNTER — Other Ambulatory Visit: Payer: Self-pay | Admitting: Internal Medicine

## 2011-07-23 ENCOUNTER — Other Ambulatory Visit: Payer: Self-pay | Admitting: Internal Medicine

## 2011-07-23 DIAGNOSIS — Z0389 Encounter for observation for other suspected diseases and conditions ruled out: Secondary | ICD-10-CM

## 2011-07-23 DIAGNOSIS — Z0000 Encounter for general adult medical examination without abnormal findings: Secondary | ICD-10-CM

## 2011-07-26 ENCOUNTER — Other Ambulatory Visit (INDEPENDENT_AMBULATORY_CARE_PROVIDER_SITE_OTHER): Payer: Managed Care, Other (non HMO)

## 2011-07-26 ENCOUNTER — Other Ambulatory Visit: Payer: Self-pay | Admitting: Internal Medicine

## 2011-07-26 DIAGNOSIS — Z0000 Encounter for general adult medical examination without abnormal findings: Secondary | ICD-10-CM

## 2011-07-26 DIAGNOSIS — Z79899 Other long term (current) drug therapy: Secondary | ICD-10-CM

## 2011-07-26 DIAGNOSIS — Z0389 Encounter for observation for other suspected diseases and conditions ruled out: Secondary | ICD-10-CM

## 2011-07-26 LAB — CBC WITH DIFFERENTIAL/PLATELET
Basophils Absolute: 0 10*3/uL (ref 0.0–0.1)
Eosinophils Relative: 4.6 % (ref 0.0–5.0)
Monocytes Relative: 12.9 % — ABNORMAL HIGH (ref 3.0–12.0)
Neutrophils Relative %: 40.9 % — ABNORMAL LOW (ref 43.0–77.0)
Platelets: 240 10*3/uL (ref 150.0–400.0)
RDW: 13.9 % (ref 11.5–14.6)
WBC: 6.4 10*3/uL (ref 4.5–10.5)

## 2011-07-26 LAB — URINALYSIS, ROUTINE W REFLEX MICROSCOPIC
Ketones, ur: NEGATIVE
Leukocytes, UA: NEGATIVE
Specific Gravity, Urine: 1.01 (ref 1.000–1.030)
Urine Glucose: NEGATIVE
Urobilinogen, UA: 0.2 (ref 0.0–1.0)

## 2011-07-26 LAB — COMPREHENSIVE METABOLIC PANEL
ALT: 40 U/L (ref 0–53)
Albumin: 4.5 g/dL (ref 3.5–5.2)
Alkaline Phosphatase: 62 U/L (ref 39–117)
CO2: 25 mEq/L (ref 19–32)
GFR: 113.66 mL/min (ref >60.00)
Glucose, Bld: 115 mg/dL — ABNORMAL HIGH (ref 70–99)
Potassium: 4.3 mEq/L (ref 3.5–5.1)
Sodium: 138 mEq/L (ref 135–145)
Total Protein: 7.4 g/dL (ref 6.0–8.3)

## 2011-07-26 LAB — LIPID PANEL
Cholesterol: 217 mg/dL — ABNORMAL HIGH (ref 0–200)
Total CHOL/HDL Ratio: 6
VLDL: 55.4 mg/dL — ABNORMAL HIGH (ref 0.0–40.0)

## 2011-07-26 LAB — TSH: TSH: 3.75 u[IU]/mL (ref 0.35–5.50)

## 2011-07-26 LAB — PSA: PSA: 0.69 ng/mL (ref 0.10–4.00)

## 2011-08-02 ENCOUNTER — Ambulatory Visit (INDEPENDENT_AMBULATORY_CARE_PROVIDER_SITE_OTHER): Payer: Managed Care, Other (non HMO) | Admitting: Internal Medicine

## 2011-08-02 ENCOUNTER — Encounter: Payer: Self-pay | Admitting: Internal Medicine

## 2011-08-02 VITALS — BP 146/80 | HR 80 | Temp 98.3°F | Resp 16 | Wt 221.0 lb

## 2011-08-02 DIAGNOSIS — Z0000 Encounter for general adult medical examination without abnormal findings: Secondary | ICD-10-CM

## 2011-08-02 DIAGNOSIS — M25569 Pain in unspecified knee: Secondary | ICD-10-CM

## 2011-08-02 DIAGNOSIS — M1990 Unspecified osteoarthritis, unspecified site: Secondary | ICD-10-CM

## 2011-08-02 DIAGNOSIS — Z136 Encounter for screening for cardiovascular disorders: Secondary | ICD-10-CM

## 2011-08-02 NOTE — Patient Instructions (Signed)
Postprocedure instructions :    A Band-Aid should be left on for 12 hours. Injection therapy is not a cure itself. It is used in conjunction with other modalities. You can use nonsteroidal anti-inflammatories like ibuprofen , hot and cold compresses. Rest is recommended in the next 24 hours. You need to report immediately  if fever, chills or any signs of infection develop. 

## 2011-08-02 NOTE — Progress Notes (Signed)
Subjective:    Patient ID: [name], male    DOB: [birth year], 60 y.o.   MRN: [id number]  HPI  The patient is here for a wellness exam. The patient has been doing well overall without major physical or psychological issues going on lately, except for knee pain Review of Systems  Constitutional: Negative for appetite change, fatigue and unexpected weight change.  HENT: Negative for nosebleeds, congestion, sore throat, sneezing, trouble swallowing and neck pain.   Eyes: Negative for itching and visual disturbance.  Respiratory: Negative for cough.   Cardiovascular: Negative for chest pain, palpitations and leg swelling.  Gastrointestinal: Negative for nausea, diarrhea, blood in stool and abdominal distention.  Genitourinary: Negative for frequency and hematuria.  Musculoskeletal: Positive for arthralgias (knees) and gait problem. Negative for back pain and joint swelling.  Skin: Negative for rash.  Neurological: Negative for dizziness, tremors, speech difficulty and weakness.  Psychiatric/Behavioral: Negative for sleep disturbance, dysphoric mood and agitation. The patient is not nervous/anxious.        Objective:   Physical Exam  Constitutional: He is oriented to person, place, and time. He appears well-developed and well-nourished. No distress.  HENT:  Head: Normocephalic and atraumatic.  Right Ear: External ear normal.  Left Ear: External ear normal.  Nose: Nose normal.  Mouth/Throat: Oropharynx is clear and moist. No oropharyngeal exudate.  Eyes: Conjunctivae and EOM are normal. Pupils are equal, round, and reactive to light. Right eye exhibits no discharge. Left eye exhibits no discharge. No scleral icterus.  Neck: Normal range of motion. Neck supple. No JVD present. No tracheal deviation present. No thyromegaly present.  Cardiovascular: Normal rate, regular rhythm, normal heart sounds and intact distal pulses.  Exam reveals no gallop and no friction rub.   No murmur  heard. Pulmonary/Chest: Effort normal and breath sounds normal. No stridor. No respiratory distress. He has no wheezes. He has no rales. He exhibits no tenderness.  Abdominal: Soft. Bowel sounds are normal. He exhibits no distension and no mass. There is no tenderness. There is no rebound and no guarding.  Genitourinary: Rectum normal, prostate normal and penis normal. Guaiac negative stool. No penile tenderness.  Musculoskeletal: Normal range of motion. He exhibits no edema and no tenderness.       B knees are tender  Lymphadenopathy:    He has no cervical adenopathy.  Neurological: He is alert and oriented to person, place, and time. He has normal reflexes. No cranial nerve deficit. He exhibits normal muscle tone. Coordination normal.  Skin: Skin is warm and dry. No rash noted. He is not diaphoretic. No erythema. No pallor.  Psychiatric: He has a normal mood and affect. His behavior is normal. Judgment and thought content normal.    Lab Results  Component Value Date   WBC 6.4 07/26/2011   HGB 15.6 07/26/2011   HCT 46.4 07/26/2011   PLT 240.0 07/26/2011   CHOL 217* 07/26/2011   TRIG 277.0* 07/26/2011   HDL 38.70* 07/26/2011   LDLDIRECT 121.6 07/26/2011   ALT 40 07/26/2011   AST 31 07/26/2011   NA 138 07/26/2011   K 4.3 07/26/2011   CL 103 07/26/2011   CREATININE 0.8 07/26/2011   BUN 12 07/26/2011   CO2 25 07/26/2011   TSH 3.75 07/26/2011   PSA 0.69 07/26/2011   HGBA1C 5.6 05/02/2008      Procedure Note :     Procedure :Joint Injection,  L knee   Indication:  Joint osteoarthritis with refractory  chronic pain.  Risks including unsuccessful procedure , bleeding, infection, bruising, skin atrophy and others were explained to the patient in detail as well as the benefits. Informed consent was obtained and signed.   Tthe patient was placed in a comfortable position. Lateral approach was used. Skin was prepped with Betadine and alcohol  and anesthetized with 2 cc of 2% lidocaine and  epinephrine, using a 25-gauge 1-1/2 inch needle. Then, a 5 cc syringe with a 2 inch long 22-gauge needle was used for a joint injection.. The needle was advanced  Into the knee joint cavity. I aspirated a small amount of intra-articular fluid to confirm correct placement of the needle and injected the joint with 5 mL of 2% lidocaine and 40 mg of Depo-Medrol .  Band-Aid was applied.   Tolerated well. Complications: None. Good pain relief following the procedure.   Postprocedure instructions :    A Band-Aid should be left on for 12 hours. Injection therapy is not a cure itself. It is used in conjunction with other modalities. You can use nonsteroidal anti-inflammatories like ibuprofen , hot and cold compresses. Rest is recommended in the next 24 hours. You need to report immediately  if fever, chills or any signs of infection develop.     Assessment & Plan:

## 2011-08-04 ENCOUNTER — Encounter: Payer: Self-pay | Admitting: Internal Medicine

## 2011-08-04 DIAGNOSIS — Z0000 Encounter for general adult medical examination without abnormal findings: Secondary | ICD-10-CM | POA: Insufficient documentation

## 2011-08-04 MED ORDER — METHYLPREDNISOLONE ACETATE 80 MG/ML IJ SUSP: 40.0000 mg | Freq: Once | INTRAMUSCULAR | Status: DC

## 2011-08-04 NOTE — Assessment & Plan Note (Signed)
We discussed age appropriate health related issues, including available/recomended screening tests and vaccinations. We discussed a need for adhering to healthy diet and exercise. Labs/EKG were reviewed/ordered. All questions were answered.   

## 2011-08-04 NOTE — Assessment & Plan Note (Signed)
Advanced OA B L>R He asked for an injection  .

## 2011-12-05 ENCOUNTER — Other Ambulatory Visit: Payer: Self-pay | Admitting: *Deleted

## 2011-12-05 MED ORDER — TRAMADOL HCL 50 MG PO TABS: 50.0000 mg | ORAL_TABLET | Freq: Two times a day (BID) | ORAL | Status: DC | PRN

## 2012-01-03 ENCOUNTER — Telehealth: Payer: Self-pay | Admitting: *Deleted

## 2012-01-03 NOTE — Telephone Encounter (Signed)
Rf req for Tramadol 50 mg take 1-2 po bid prn. # 120 Ok to Rf?

## 2012-01-03 NOTE — Telephone Encounter (Signed)
OK to fill this prescription with additional refills x5 Thank you!  

## 2012-01-04 ENCOUNTER — Encounter: Payer: Self-pay | Admitting: *Deleted

## 2012-01-04 MED ORDER — TRAMADOL HCL 50 MG PO TABS: 50.0000 mg | ORAL_TABLET | Freq: Two times a day (BID) | ORAL | Status: DC | PRN

## 2012-02-03 ENCOUNTER — Encounter: Payer: Self-pay | Admitting: Internal Medicine

## 2012-02-03 ENCOUNTER — Ambulatory Visit (INDEPENDENT_AMBULATORY_CARE_PROVIDER_SITE_OTHER): Payer: Managed Care, Other (non HMO) | Admitting: Internal Medicine

## 2012-02-03 VITALS — BP 110/70 | HR 92 | Temp 98.2°F | Resp 16 | Wt 225.0 lb

## 2012-02-03 DIAGNOSIS — M25569 Pain in unspecified knee: Secondary | ICD-10-CM

## 2012-02-03 DIAGNOSIS — M1990 Unspecified osteoarthritis, unspecified site: Secondary | ICD-10-CM

## 2012-02-03 DIAGNOSIS — K219 Gastro-esophageal reflux disease without esophagitis: Secondary | ICD-10-CM

## 2012-02-03 NOTE — Assessment & Plan Note (Signed)
Continue with current prescription therapy as reflected on the Med list.  

## 2012-02-03 NOTE — Assessment & Plan Note (Signed)
Chronic  Continue with current prescription therapy as reflected on the Med list.  

## 2012-02-03 NOTE — Progress Notes (Signed)
Patient ID: [name], male   DOB: [birth year], 61 y.o.   MRN: [id number]  Subjective:    Patient ID: [name], male    DOB: Jul 10, 1951, 61 y.o.   MRN: [id number]  HPI  F/u on knee pain, rhinitis, dermatitis Review of Systems  Constitutional: Negative for appetite change, fatigue and unexpected weight change.  HENT: Negative for nosebleeds, congestion, sore throat, sneezing, trouble swallowing and neck pain.   Eyes: Negative for itching and visual disturbance.  Respiratory: Negative for cough.   Cardiovascular: Negative for chest pain, palpitations and leg swelling.  Gastrointestinal: Negative for nausea, diarrhea, blood in stool and abdominal distention.  Genitourinary: Negative for frequency and hematuria.  Musculoskeletal: Positive for arthralgias (knees) and gait problem. Negative for back pain and joint swelling.  Skin: Negative for rash.  Neurological: Negative for dizziness, tremors, speech difficulty and weakness.  Psychiatric/Behavioral: Negative for sleep disturbance, dysphoric mood and agitation. The patient is not nervous/anxious.        Objective:   Physical Exam  Constitutional: He is oriented to person, place, and time. He appears well-developed and well-nourished. No distress.  HENT:  Head: Normocephalic and atraumatic.  Right Ear: External ear normal.  Left Ear: External ear normal.  Nose: Nose normal.  Mouth/Throat: Oropharynx is clear and moist. No oropharyngeal exudate.  Eyes: Conjunctivae and EOM are normal. Pupils are equal, round, and reactive to light. Right eye exhibits no discharge. Left eye exhibits no discharge. No scleral icterus.  Neck: Normal range of motion. Neck supple. No JVD present. No tracheal deviation present. No thyromegaly present.  Cardiovascular: Normal rate, regular rhythm, normal heart sounds and intact distal pulses.  Exam reveals no gallop and no friction rub.   No murmur heard. Pulmonary/Chest: Effort normal and breath sounds  normal. No stridor. No respiratory distress. He has no wheezes. He has no rales. He exhibits no tenderness.  Abdominal: Soft. Bowel sounds are normal. He exhibits no distension and no mass. There is no tenderness. There is no rebound and no guarding.  Musculoskeletal: Normal range of motion. He exhibits no edema and no tenderness.       B knees are tender  Lymphadenopathy:    He has no cervical adenopathy.  Neurological: He is alert and oriented to person, place, and time. He has normal reflexes. No cranial nerve deficit. He exhibits normal muscle tone. Coordination normal.  Skin: Skin is warm and dry. No rash noted. He is not diaphoretic. No erythema. No pallor.  Psychiatric: He has a normal mood and affect. His behavior is normal. Judgment and thought content normal.    Lab Results  Component Value Date   WBC 6.4 07/26/2011   HGB 15.6 07/26/2011   HCT 46.4 07/26/2011   PLT 240.0 07/26/2011   CHOL 217* 07/26/2011   TRIG 277.0* 07/26/2011   HDL 38.70* 07/26/2011   LDLDIRECT 121.6 07/26/2011   ALT 40 07/26/2011   AST 31 07/26/2011   NA 138 07/26/2011   K 4.3 07/26/2011   CL 103 07/26/2011   CREATININE 0.8 07/26/2011   BUN 12 07/26/2011   CO2 25 07/26/2011   TSH 3.75 07/26/2011   PSA 0.69 07/26/2011   HGBA1C 5.6 05/02/2008         Assessment & Plan:

## 2012-02-03 NOTE — Assessment & Plan Note (Signed)
Chronic sx's Continue with current prescription therapy as reflected on the Med list.  

## 2012-02-05 ENCOUNTER — Other Ambulatory Visit: Payer: Self-pay | Admitting: Internal Medicine

## 2012-03-03 ENCOUNTER — Other Ambulatory Visit: Payer: Self-pay | Admitting: Internal Medicine

## 2012-04-10 ENCOUNTER — Other Ambulatory Visit: Payer: Self-pay | Admitting: Internal Medicine

## 2012-04-12 ENCOUNTER — Other Ambulatory Visit: Payer: Self-pay

## 2012-04-12 MED ORDER — PREDNISONE 10 MG PO TABS: ORAL_TABLET | ORAL | Status: DC

## 2012-04-12 NOTE — Telephone Encounter (Signed)
Pt called requesting a refill of prednisone for OA pain in his knees. Pt states he has taken this medication before for this reason and he had OV with AVP for same but forgot to ask for a refill, please advise. Thanks!

## 2012-04-12 NOTE — Telephone Encounter (Signed)
Pt advised of Rx and pharmacy 

## 2012-06-18 ENCOUNTER — Encounter: Payer: Self-pay | Admitting: Internal Medicine

## 2012-06-18 ENCOUNTER — Other Ambulatory Visit (INDEPENDENT_AMBULATORY_CARE_PROVIDER_SITE_OTHER): Payer: Managed Care, Other (non HMO)

## 2012-06-18 ENCOUNTER — Ambulatory Visit (INDEPENDENT_AMBULATORY_CARE_PROVIDER_SITE_OTHER): Payer: Managed Care, Other (non HMO) | Admitting: Internal Medicine

## 2012-06-18 VITALS — BP 110/70 | HR 73 | Temp 98.6°F | Resp 16 | Wt 221.0 lb

## 2012-06-18 DIAGNOSIS — Z136 Encounter for screening for cardiovascular disorders: Secondary | ICD-10-CM

## 2012-06-18 DIAGNOSIS — M25569 Pain in unspecified knee: Secondary | ICD-10-CM

## 2012-06-18 DIAGNOSIS — R0609 Other forms of dyspnea: Secondary | ICD-10-CM

## 2012-06-18 DIAGNOSIS — Z01818 Encounter for other preprocedural examination: Secondary | ICD-10-CM

## 2012-06-18 DIAGNOSIS — R0989 Other specified symptoms and signs involving the circulatory and respiratory systems: Secondary | ICD-10-CM

## 2012-06-18 LAB — BASIC METABOLIC PANEL
Calcium: 9 mg/dL (ref 8.4–10.5)
GFR: 118.78 mL/min (ref >60.00)
Sodium: 137 mEq/L (ref 135–145)

## 2012-06-18 LAB — HEPATIC FUNCTION PANEL
Alkaline Phosphatase: 54 U/L (ref 39–117)
Bilirubin, Direct: 0 mg/dL (ref 0.0–0.3)
Total Bilirubin: 0.6 mg/dL (ref 0.3–1.2)
Total Protein: 7.2 g/dL (ref 6.0–8.3)

## 2012-06-18 LAB — URINALYSIS
Ketones, ur: NEGATIVE
Leukocytes, UA: NEGATIVE
Nitrite: NEGATIVE
Specific Gravity, Urine: 1.015 (ref 1.000–1.030)
pH: 6 (ref 5.0–8.0)

## 2012-06-18 LAB — CBC WITH DIFFERENTIAL/PLATELET
Basophils Absolute: 0 10*3/uL (ref 0.0–0.1)
Hemoglobin: 13.9 g/dL (ref 13.0–17.0)
Lymphocytes Relative: 36.5 % (ref 12.0–46.0)
Monocytes Relative: 13.4 % — ABNORMAL HIGH (ref 3.0–12.0)
Neutro Abs: 3.8 10*3/uL (ref 1.4–7.7)
RBC: 4.36 Mil/uL (ref 4.22–5.81)
RDW: 13.3 % (ref 11.5–14.6)

## 2012-06-18 NOTE — Assessment & Plan Note (Signed)
Stress ECHO?

## 2012-06-18 NOTE — Progress Notes (Signed)
Patient ID: [name], male   DOB: 23-Apr-1951, 61 y.o.   MRN: [id number]  Subjective:    Patient ID: [name], male    DOB: 02-19-1951, 61 y.o.   MRN: [id number]  HPI  The patient is here for a pre-op exam/IM consult Req by Dr Thomasena Edis.  Hx: The patient has been doing well overall without major physical or psychological issues going on lately, except for L knee pain - worse. GERD is stable. C/o DOE  Past Medical History  Diagnosis Date  . OA (osteoarthritis) of knee   . Colonic polyp   . GERD (gastroesophageal reflux disease)    Past Surgical History  Procedure Date  . Knee arthroscopy     Left    reports that he has been smoking Cigars.  He does not have any smokeless tobacco history on file. He reports that he drinks alcohol. He reports that he does not use illicit drugs. family history includes Coronary artery disease in his other; Diabetes in his mother; and Heart disease (age of onset:50) in his father. Allergies  Allergen Reactions  . Penicillins Other (See Comments)    Convulsions   Current Outpatient Prescriptions on File Prior to Visit  Medication Sig Dispense Refill  . aspirin 81 MG tablet Take 81 mg by mouth daily.        . CELEBREX 200 MG capsule TAKE 1 TO 2 CAPSULES ONCE DAILY WITH FOOD  60 capsule  5  . cetirizine (ZYRTEC) 10 MG tablet Take 10 mg by mouth daily.        . Cholecalciferol 1000 UNITS tablet Take 2,000 Units by mouth daily.        . clobetasol (TEMOVATE) 0.05 % cream Apply 1 application topically 2 (two) times daily.        . cyclobenzaprine (FLEXERIL) 10 MG tablet Take 5-10 mg by mouth 2 (two) times daily as needed.        . metoCLOPramide (REGLAN) 10 MG tablet Take 10 mg by mouth 4 (four) times daily.        [name] NASONEX 50 MCG/ACT nasal spray USE AS DIRECTED  17 g  7  . pantoprazole (PROTONIX) 40 MG tablet Take 40 mg by mouth daily.        . traMADol (ULTRAM) 50 MG tablet Take 1-2 tablets (50-100 mg total) by mouth 2 (two) times daily as  needed for pain.  120 tablet  5  . valACYclovir (VALTREX) 500 MG tablet TAKE 1 TABLET BY MOUTH EVERY DAY  90 tablet  2   Current Facility-Administered Medications on File Prior to Visit  Medication Dose Route Frequency Provider Last Rate Last Dose  . methylPREDNISolone acetate (DEPO-MEDROL) injection 40 mg  40 mg Intra-articular Once Georgina Quint Thermon Zulauf, MD       BP 110/70  Pulse 73  Temp 98.6 F (37 C) (Oral)  Resp 16  Wt 221 lb (100.245 kg)  SpO2 97%  Review of Systems  Constitutional: Negative for appetite change, fatigue and unexpected weight change.  HENT: Negative for nosebleeds, congestion, sore throat, sneezing, trouble swallowing and neck pain.   Eyes: Negative for itching and visual disturbance.  Respiratory: Negative for cough.   Cardiovascular: Negative for chest pain, palpitations and leg swelling.  Gastrointestinal: Negative for nausea, diarrhea, blood in stool and abdominal distention.  Genitourinary: Negative for frequency and hematuria.  Musculoskeletal: Positive for arthralgias (knees) and gait problem. Negative for back pain and joint swelling.  Skin: Negative for rash.  Neurological: Negative for dizziness, tremors, speech difficulty and weakness.  Psychiatric/Behavioral: Negative for disturbed wake/sleep cycle, dysphoric mood and agitation. The patient is not nervous/anxious.    Wt Readings from Last 3 Encounters:  06/18/12 221 lb (100.245 kg)  02/03/12 225 lb (102.059 kg)  08/02/11 221 lb (100.245 kg)   BP Readings from Last 3 Encounters:  06/18/12 110/70  02/03/12 110/70  08/02/11 146/80        Objective:   Physical Exam  Constitutional: He is oriented to person, place, and time. He appears well-developed and well-nourished. No distress.  HENT:  Head: Normocephalic and atraumatic.  Right Ear: External ear normal.  Left Ear: External ear normal.  Nose: Nose normal.  Mouth/Throat: Oropharynx is clear and moist. No oropharyngeal exudate.  Eyes:  Conjunctivae and EOM are normal. Pupils are equal, round, and reactive to light. Right eye exhibits no discharge. Left eye exhibits no discharge. No scleral icterus.  Neck: Normal range of motion. Neck supple. No JVD present. No tracheal deviation present. No thyromegaly present.  Cardiovascular: Normal rate, regular rhythm, normal heart sounds and intact distal pulses.  Exam reveals no gallop and no friction rub.   No murmur heard. Pulmonary/Chest: Effort normal and breath sounds normal. No stridor. No respiratory distress. He has no wheezes. He has no rales. He exhibits no tenderness.  Abdominal: Soft. Bowel sounds are normal. He exhibits no distension and no mass. There is no tenderness. There is no rebound and no guarding.  Genitourinary: Rectum normal, prostate normal and penis normal. Guaiac negative stool. No penile tenderness.  Musculoskeletal: Normal range of motion. He exhibits no edema and no tenderness.       B knees are tender  Lymphadenopathy:    He has no cervical adenopathy.  Neurological: He is alert and oriented to person, place, and time. He has normal reflexes. No cranial nerve deficit. He exhibits normal muscle tone. Coordination normal.  Skin: Skin is warm and dry. No rash noted. He is not diaphoretic. No erythema. No pallor.  Psychiatric: He has a normal mood and affect. His behavior is normal. Judgment and thought content normal.    Lab Results  Component Value Date   WBC 6.4 07/26/2011   HGB 15.6 07/26/2011   HCT 46.4 07/26/2011   PLT 240.0 07/26/2011   CHOL 217* 07/26/2011   TRIG 277.0* 07/26/2011   HDL 38.70* 07/26/2011   LDLDIRECT 121.6 07/26/2011   ALT 40 07/26/2011   AST 31 07/26/2011   NA 138 07/26/2011   K 4.3 07/26/2011   CL 103 07/26/2011   CREATININE 0.8 07/26/2011   BUN 12 07/26/2011   CO2 25 07/26/2011   TSH 3.75 07/26/2011   PSA 0.69 07/26/2011   HGBA1C 5.6 05/02/2008         Assessment & Plan:

## 2012-06-18 NOTE — Assessment & Plan Note (Signed)
Surgery - R TKR is planned

## 2012-06-19 LAB — TSH: TSH: 3.25 u[IU]/mL (ref 0.35–5.50)

## 2012-06-20 ENCOUNTER — Telehealth: Payer: Self-pay | Admitting: Internal Medicine

## 2012-06-20 NOTE — Assessment & Plan Note (Signed)
8/13 He should be clear for R TKR assuming his labs and stress test results are acceptable. Thank you!

## 2012-06-20 NOTE — Telephone Encounter (Signed)
[name], please, inform patient that all labs are normal except for elev glu: loose 5-10 lbs Thx

## 2012-06-20 NOTE — Telephone Encounter (Signed)
Left mess for patient to call back.  

## 2012-06-21 NOTE — Telephone Encounter (Signed)
Pt informed

## 2012-06-23 ENCOUNTER — Other Ambulatory Visit: Payer: Self-pay | Admitting: Internal Medicine

## 2012-06-25 ENCOUNTER — Other Ambulatory Visit: Payer: Self-pay | Admitting: Pain Medicine

## 2012-06-25 NOTE — Telephone Encounter (Signed)
Last written 01/04/2012 #120 with 5 refills-please advise.

## 2012-06-26 ENCOUNTER — Other Ambulatory Visit (HOSPITAL_COMMUNITY): Payer: Managed Care, Other (non HMO)

## 2012-07-02 ENCOUNTER — Encounter: Payer: Self-pay | Admitting: Internal Medicine

## 2012-07-02 ENCOUNTER — Ambulatory Visit (HOSPITAL_COMMUNITY): Payer: Managed Care, Other (non HMO) | Attending: Cardiology

## 2012-07-02 DIAGNOSIS — F17200 Nicotine dependence, unspecified, uncomplicated: Secondary | ICD-10-CM | POA: Insufficient documentation

## 2012-07-02 DIAGNOSIS — R0609 Other forms of dyspnea: Secondary | ICD-10-CM | POA: Insufficient documentation

## 2012-07-02 DIAGNOSIS — R0989 Other specified symptoms and signs involving the circulatory and respiratory systems: Secondary | ICD-10-CM | POA: Insufficient documentation

## 2012-07-02 DIAGNOSIS — Z01818 Encounter for other preprocedural examination: Secondary | ICD-10-CM

## 2012-07-02 MED ORDER — SODIUM CHLORIDE 0.9 % IV SOLN
40.0000 ug/kg | Freq: Once | INTRAVENOUS | Status: AC
  Administered 2012-07-02: 40 ug/kg/min via INTRAVENOUS

## 2012-07-02 NOTE — Progress Notes (Signed)
Echocardiogram performed.  

## 2012-07-03 ENCOUNTER — Telehealth: Payer: Self-pay | Admitting: Internal Medicine

## 2012-07-03 NOTE — Telephone Encounter (Signed)
[name], please, inform patient that his stress test was nl. Pls fax it to Dr Thomasena Edis (Ortho) Thx

## 2012-07-03 NOTE — Telephone Encounter (Signed)
Pt informed/ copy faxed.

## 2012-07-06 ENCOUNTER — Encounter: Payer: Self-pay | Admitting: Internal Medicine

## 2012-07-06 ENCOUNTER — Ambulatory Visit (INDEPENDENT_AMBULATORY_CARE_PROVIDER_SITE_OTHER): Payer: Managed Care, Other (non HMO) | Admitting: Internal Medicine

## 2012-07-06 VITALS — BP 102/78 | HR 80 | Temp 98.2°F | Resp 16

## 2012-07-06 DIAGNOSIS — L600 Ingrowing nail: Secondary | ICD-10-CM | POA: Insufficient documentation

## 2012-07-06 MED ORDER — DOXYCYCLINE HYCLATE 100 MG PO TABS: 100.0000 mg | ORAL_TABLET | Freq: Two times a day (BID) | ORAL | Status: DC

## 2012-07-06 MED ORDER — MUPIROCIN 2 % EX OINT: TOPICAL_OINTMENT | CUTANEOUS | Status: AC

## 2012-07-06 NOTE — Assessment & Plan Note (Addendum)
8/13 infected Doxy x 10 d Mupirocin bid Soaks He declined a podiatry appt for a permanent procedure

## 2012-07-06 NOTE — Progress Notes (Signed)
Patient ID: [name], male   DOB: 07-28-1951, 61 y.o.   MRN: [id number]  Subjective:    Patient ID: [name], male    DOB: 20-May-1951, 61 y.o.   MRN: [id number]  HPI C/o L big toe ingrown toenail infection x several days  Past Medical History  Diagnosis Date  . OA (osteoarthritis) of knee   . Colonic polyp   . GERD (gastroesophageal reflux disease)    Past Surgical History  Procedure Date  . Knee arthroscopy     Left    reports that he has been smoking Cigars.  He does not have any smokeless tobacco history on file. He reports that he drinks alcohol. He reports that he does not use illicit drugs. family history includes Coronary artery disease in his other; Diabetes in his mother; and Heart disease (age of onset:50) in his father. Allergies  Allergen Reactions  . Penicillins Other (See Comments)    Convulsions   Current Outpatient Prescriptions on File Prior to Visit  Medication Sig Dispense Refill  . aspirin 81 MG tablet Take 81 mg by mouth daily.        . CELEBREX 200 MG capsule TAKE 1 TO 2 CAPSULES ONCE DAILY WITH FOOD  60 capsule  5  . cetirizine (ZYRTEC) 10 MG tablet Take 10 mg by mouth daily.        . Cholecalciferol 1000 UNITS tablet Take 2,000 Units by mouth daily.        . clobetasol (TEMOVATE) 0.05 % cream Apply 1 application topically 2 (two) times daily.        . cyclobenzaprine (FLEXERIL) 10 MG tablet Take 5-10 mg by mouth 2 (two) times daily as needed.        . metoCLOPramide (REGLAN) 10 MG tablet Take 10 mg by mouth 4 (four) times daily.        [name] NASONEX 50 MCG/ACT nasal spray USE AS DIRECTED  17 g  7  . pantoprazole (PROTONIX) 40 MG tablet Take 40 mg by mouth daily.        . traMADol (ULTRAM) 50 MG tablet TAKE 1-2 TABLETS (50-100 MG TOTAL) BY MOUTH 2 (TWO) TIMES DAILY AS NEEDED FOR PAIN.  120 tablet  5  . valACYclovir (VALTREX) 500 MG tablet TAKE 1 TABLET BY MOUTH EVERY DAY  90 tablet  2   Current Facility-Administered Medications on File Prior to  Visit  Medication Dose Route Frequency Provider Last Rate Last Dose  . methylPREDNISolone acetate (DEPO-MEDROL) injection 40 mg  40 mg Intra-articular Once Georgina Quint Plotnikov, MD       BP 102/78  Pulse 80  Temp 98.2 F (36.8 C) (Oral)  Resp 16  Review of Systems  Constitutional: Negative for appetite change, fatigue and unexpected weight change.  HENT: Negative for nosebleeds, congestion, sore throat, sneezing, trouble swallowing and neck pain.   Eyes: Negative for itching and visual disturbance.  Respiratory: Negative for cough.   Cardiovascular: Negative for chest pain, palpitations and leg swelling.  Gastrointestinal: Negative for nausea, diarrhea, blood in stool and abdominal distention.  Genitourinary: Negative for frequency and hematuria.  Musculoskeletal: Positive for arthralgias (knees) and gait problem. Negative for back pain and joint swelling.  Skin: Negative for rash.  Neurological: Negative for dizziness, tremors, speech difficulty and weakness.  Psychiatric/Behavioral: Negative for disturbed wake/sleep cycle, dysphoric mood and agitation. The patient is not nervous/anxious.    Wt Readings from Last 3 Encounters:  06/18/12 221 lb (100.245 kg)  02/03/12 225  lb (102.059 kg)  08/02/11 221 lb (100.245 kg)   BP Readings from Last 3 Encounters:  07/06/12 102/78  06/18/12 110/70  02/03/12 110/70        Objective:   Physical Exam  Constitutional: He is oriented to person, place, and time. He appears well-developed and well-nourished. No distress.  HENT:  Head: Normocephalic and atraumatic.  Right Ear: External ear normal.  Left Ear: External ear normal.  Nose: Nose normal.  Mouth/Throat: Oropharynx is clear and moist. No oropharyngeal exudate.  Eyes: Conjunctivae and EOM are normal. Pupils are equal, round, and reactive to light. Right eye exhibits no discharge. Left eye exhibits no discharge. No scleral icterus.  Neck: Normal range of motion. Neck supple. No JVD  present. No tracheal deviation present. No thyromegaly present.  Cardiovascular: Normal rate, regular rhythm, normal heart sounds and intact distal pulses.  Exam reveals no gallop and no friction rub.   No murmur heard. Pulmonary/Chest: Effort normal and breath sounds normal. No stridor. No respiratory distress. He has no wheezes. He has no rales. He exhibits no tenderness.  Abdominal: Soft. Bowel sounds are normal. He exhibits no distension and no mass. There is no tenderness. There is no rebound and no guarding.  Genitourinary: Rectum normal, prostate normal and penis normal. Guaiac negative stool. No penile tenderness.  Musculoskeletal: Normal range of motion. He exhibits no edema and no tenderness.       B knees are tender  Lymphadenopathy:    He has no cervical adenopathy.  Neurological: He is alert and oriented to person, place, and time. He has normal reflexes. No cranial nerve deficit. He exhibits normal muscle tone. Coordination normal.  Skin: Skin is warm and dry. No rash noted. He is not diaphoretic. No erythema. No pallor.       L big toe is swollen laterally with a flap of granulation tissue over lateral ingrown toenail corner; purulent drainage, erythema  Psychiatric: He has a normal mood and affect. His behavior is normal. Judgment and thought content normal.    Lab Results  Component Value Date   WBC 8.1 06/18/2012   HGB 13.9 06/18/2012   HCT 41.2 06/18/2012   PLT 224.0 06/18/2012   CHOL 217* 07/26/2011   TRIG 277.0* 07/26/2011   HDL 38.70* 07/26/2011   LDLDIRECT 121.6 07/26/2011   ALT 32 06/18/2012   AST 22 06/18/2012   NA 137 06/18/2012   K 3.8 06/18/2012   CL 105 06/18/2012   CREATININE 0.7 06/18/2012   BUN 10 06/18/2012   CO2 25 06/18/2012   TSH 3.25 06/18/2012   PSA 0.69 07/26/2011   HGBA1C 5.6 05/02/2008    Procedure note:  Incision and Drainage of an ingrown toenail abscess   Indication :  L big toe ingrown toenail infection     Risks including unsuccessful procedure , possible  need for a repeat procedure due to pus accumulation, scar formation, and others as well as benefits were explained to the patient in detail. Written consent was obtained/signed.    The toe was soaked, then the patient was placed in a decubitus position. The area of an abscess was prepped with povidone-iodine and draped in a sterile fashion. Local nerve block anesthesia with   2    cc of 2% lidocaine and epinephrine  was administered laterally only.  A flap of granulating tissue over the nail was removed with a round blade. About 0.5 cc of purulent material was expressed. The abscess cavity was cleaned. The nail corner  was clipped. The cavity was filled w/abx oinment. The wound was dressed with antibiotic ointment and Telfa pad.  Tolerated well. Complications: None.   Wound instructions provided.    Assessment & Plan:

## 2012-07-15 HISTORY — PX: JOINT REPLACEMENT: SHX530

## 2012-07-16 ENCOUNTER — Emergency Department (HOSPITAL_COMMUNITY): Payer: BC Managed Care – PPO

## 2012-07-16 ENCOUNTER — Other Ambulatory Visit: Payer: Self-pay

## 2012-07-16 ENCOUNTER — Encounter (HOSPITAL_COMMUNITY): Payer: Self-pay | Admitting: *Deleted

## 2012-07-16 ENCOUNTER — Observation Stay (HOSPITAL_COMMUNITY)
Admission: EM | Admit: 2012-07-16 | Discharge: 2012-07-17 | Disposition: A | Discharge status: Home / Self Care | Payer: BC Managed Care – PPO | Attending: Emergency Medicine | Admitting: Emergency Medicine

## 2012-07-16 DIAGNOSIS — M1710 Unilateral primary osteoarthritis, unspecified knee: Secondary | ICD-10-CM | POA: Insufficient documentation

## 2012-07-16 DIAGNOSIS — R079 Chest pain, unspecified: Secondary | ICD-10-CM | POA: Insufficient documentation

## 2012-07-16 DIAGNOSIS — R911 Solitary pulmonary nodule: Secondary | ICD-10-CM

## 2012-07-16 DIAGNOSIS — R110 Nausea: Secondary | ICD-10-CM | POA: Insufficient documentation

## 2012-07-16 DIAGNOSIS — K219 Gastro-esophageal reflux disease without esophagitis: Secondary | ICD-10-CM | POA: Insufficient documentation

## 2012-07-16 DIAGNOSIS — R42 Dizziness and giddiness: Secondary | ICD-10-CM | POA: Insufficient documentation

## 2012-07-16 DIAGNOSIS — R0602 Shortness of breath: Principal | ICD-10-CM | POA: Insufficient documentation

## 2012-07-16 LAB — BASIC METABOLIC PANEL
Chloride: 101 mEq/L (ref 96–112)
GFR calc Af Amer: >90 mL/min (ref >90)
GFR calc non Af Amer: >90 mL/min (ref >90)
Potassium: 3.6 mEq/L (ref 3.5–5.1)
Sodium: 137 mEq/L (ref 135–145)

## 2012-07-16 LAB — CBC
HCT: 42.8 % (ref 39.0–52.0)
Hemoglobin: 15.3 g/dL (ref 13.0–17.0)
RBC: 4.73 MIL/uL (ref 4.22–5.81)

## 2012-07-16 LAB — POCT I-STAT, CHEM 8
BUN: 12 mg/dL (ref 6–23)
Calcium, Ion: 1.16 mmol/L (ref 1.12–1.23)
Chloride: 105 meq/L (ref 96–112)
Creatinine, Ser: 0.9 mg/dL (ref 0.50–1.35)
Glucose, Bld: 114 mg/dL — ABNORMAL HIGH (ref 70–99)
HCT: 47 % (ref 39.0–52.0)
Hemoglobin: 16 g/dL (ref 13.0–17.0)
Potassium: 3.6 meq/L (ref 3.5–5.1)
Sodium: 142 meq/L (ref 135–145)
TCO2: 25 mmol/L (ref 0–100)

## 2012-07-16 LAB — POCT I-STAT TROPONIN I: Troponin i, poc: 0.01 ng/mL (ref 0.00–0.08)

## 2012-07-16 NOTE — ED Provider Notes (Signed)
History     CSN: [id number]  Arrival date & time 07/16/12  1506   First MD Initiated Contact with Patient 07/16/12 2052      Chief Complaint  Patient presents with  . Shortness of Breath  . Chest Pain    Patient is a 61 year old male with past medical history of osteoarthritis and GERD who presents with shortness of breath. Patient reports that at 2 PM he laid down, and after doing so he began having moderate shortness of breath. Associated symptoms include nausea and lightheadedness. Patient reports symptoms persisted and thus he decided to come to the emergency department. Family reports, that patient was diaphoretic and having difficulty breathing.  He denies any chest pain.  Of note patient had a recent exercise stress test completed on August 16 for preop clearance and review of records shows negative inducible ischemia.    (Consider location/radiation/quality/duration/timing/severity/associated sxs/prior treatment) Patient is a 61 y.o. male presenting with shortness of breath. The history is provided by the patient. No language interpreter was used.  Shortness of Breath  The current episode started today. The onset was sudden. The problem occurs rarely. The problem has been resolved. The problem is moderate. Nothing relieves the symptoms. Nothing aggravates the symptoms. Associated symptoms include shortness of breath. Pertinent negatives include no chest pain and no chest pressure. There was no intake of a foreign body. He has been behaving normally. Urine output has been normal. The last void occurred less than 6 hours ago. There were no sick contacts. Recently, medical care has been given by a specialist. Services received include medications given and tests performed.    Past Medical History  Diagnosis Date  . OA (osteoarthritis) of knee   . Colonic polyp   . GERD (gastroesophageal reflux disease)     Past Surgical History  Procedure Date  . Knee arthroscopy     Left     Family History  Problem Relation Age of Onset  . Coronary artery disease Other   . Diabetes Mother   . Heart disease Father 3    CAD, MI, CHF    History  Substance Use Topics  . Smoking status: Current Everyday Smoker    Types: Cigars  . Smokeless tobacco: Not on file  . Alcohol Use: Yes      Review of Systems  Respiratory: Positive for shortness of breath.   Cardiovascular: Negative for chest pain.  All other systems reviewed and are negative.    Allergies  Penicillins  Home Medications   Current Outpatient Rx  Name Route Sig Dispense Refill  . ASPIRIN 81 MG PO TABS Oral Take 81 mg by mouth daily.      . CELEBREX 200 MG PO CAPS  TAKE 1 TO 2 CAPSULES ONCE DAILY WITH FOOD 60 capsule 5  . CETIRIZINE HCL 10 MG PO TABS Oral Take 10 mg by mouth daily.      . CHOLECALCIFEROL 1000 UNITS PO TABS Oral Take 2,000 Units by mouth daily.      [name] CLOBETASOL PROPIONATE 0.05 % EX CREA Topical Apply 1 application topically 2 (two) times daily.      . CYCLOBENZAPRINE HCL 10 MG PO TABS Oral Take 5-10 mg by mouth 2 (two) times daily as needed.      [name] DOXYCYCLINE HYCLATE 100 MG PO TABS Oral Take 1 tablet (100 mg total) by mouth 2 (two) times daily. 20 tablet 1  . METOCLOPRAMIDE HCL 10 MG PO TABS Oral Take 10 mg by mouth  4 (four) times daily.      [name] NASONEX 50 MCG/ACT NA SUSP  USE AS DIRECTED 17 g 7  . PANTOPRAZOLE SODIUM 40 MG PO TBEC Oral Take 40 mg by mouth daily.      . TRAMADOL HCL 50 MG PO TABS  TAKE 1-2 TABLETS (50-100 MG TOTAL) BY MOUTH 2 (TWO) TIMES DAILY AS NEEDED FOR PAIN. 120 tablet 5  . VALACYCLOVIR HCL 500 MG PO TABS  TAKE 1 TABLET BY MOUTH EVERY DAY 90 tablet 2    BP 140/79  Pulse 71  Temp 98.3 F (36.8 C) (Oral)  Resp 18  SpO2 97%  Physical Exam  Constitutional: He is oriented to person, place, and time. He appears well-developed and well-nourished.  HENT:  Head: Normocephalic and atraumatic.  Right Ear: External ear normal.  Left Ear: External ear normal.   Nose: Nose normal.  Mouth/Throat: Oropharynx is clear and moist.  Eyes: Conjunctivae and EOM are normal. Pupils are equal, round, and reactive to light.  Neck: Normal range of motion. Neck supple. No JVD present. No tracheal deviation present. No thyromegaly present.  Cardiovascular: Normal rate, regular rhythm, normal heart sounds and intact distal pulses.  Exam reveals no gallop and no friction rub.   No murmur heard. Pulmonary/Chest: Effort normal and breath sounds normal. No stridor. No respiratory distress. He has no wheezes. He has no rales. He exhibits no tenderness.  Abdominal: Soft. Bowel sounds are normal.  Musculoskeletal: Normal range of motion. He exhibits no edema and no tenderness.  Lymphadenopathy:    He has no cervical adenopathy.  Neurological: He is alert and oriented to person, place, and time. He has normal reflexes.  Skin: Skin is warm and dry.  Psychiatric: He has a normal mood and affect.    ED Course  Procedures (including critical care time)  Labs Reviewed  BASIC METABOLIC PANEL - Abnormal; Notable for the following:    Glucose, Bld 115 (*)     All other components within normal limits  POCT I-STAT, CHEM 8 - Abnormal; Notable for the following:    Glucose, Bld 114 (*)     All other components within normal limits  CBC  POCT I-STAT TROPONIN I  POCT I-STAT TROPONIN I   Dg Chest 2 View  07/16/2012  *RADIOLOGY REPORT*  Clinical Data: Shortness of breath  CHEST - 2 VIEW  Comparison: 02/08/06  Findings: The heart size and mediastinal contours are within normal limits. No airspace consolidation.  Pulmonary nodule within the right lower lobe is noted measuring 1.3 cm.  New from previous exam.   The visualized skeletal structures are unremarkable.  IMPRESSION:  Right lower lobe nodule is indeterminate.  Suggest further evaluation with CT of the chest   Original Report Authenticated By: Rosealee Albee, M.D.     Date: 07/17/2012  Rate: 68  Rhythm: normal sinus  rhythm  QRS Axis: normal  Intervals: normal  ST/T Wave abnormalities: normal  Conduction Disutrbances:none  Narrative Interpretation:   Old EKG Reviewed: unchanged    1. Shortness of breath   2. Nausea   3. Lightheaded       MDM   Patient is a 61 year old male with reported past medical history of osteoarthritis, GERD and strong family history of coronary artery disease (father began having heart attacks in his 85s and died age 65, younger brother also with known coronary artery disease) presents with episode shortness of breath, nausea, and diaphoresis. Upon arrival in the emergency department patient's symptoms  had resolved.  Afebrile vital signs within normal limits. Exam as above and noncontributory. Specifically patient had no signs of volume overload, and normal heart and lung sounds. Due to concerning story as well as strong family history there was concern for ACS.  Review of EKG showed no signs of ischemia and unchanged from prior.  Labs, including delta troponin, wnl.  Despite having a negative exercise stress test two weeks ago, clinical presentation not easily attributed to other etiology other than ACS and with strong family history it was felt that additional testing is necessary. Patient placed in CDU observation overnight for CT coronary study in the morning.  ASA 325 given.         Johnney Ou, MD 07/17/12 [phone]

## 2012-07-16 NOTE — ED Notes (Signed)
To ED for eval of dizziness, sob, and cp pta. Pt states he was been worked up recently for same due to pre-op for knee surgery. States 'i don't feel normal'. Neuro intact. Appears in nad.

## 2012-07-16 NOTE — ED Notes (Signed)
Pt to restroom/ ambulated w/o assistance

## 2012-07-17 ENCOUNTER — Observation Stay (HOSPITAL_COMMUNITY): Payer: BC Managed Care – PPO

## 2012-07-17 LAB — POCT I-STAT TROPONIN I

## 2012-07-17 MED ORDER — METOPROLOL TARTRATE 1 MG/ML IV SOLN
5.0000 mg | Freq: Once | INTRAVENOUS | Status: AC
  Administered 2012-07-17: 5 mg via INTRAVENOUS

## 2012-07-17 MED ORDER — METOPROLOL TARTRATE 1 MG/ML IV SOLN
INTRAVENOUS | Status: AC
  Filled 2012-07-17: qty 10

## 2012-07-17 MED ORDER — METOPROLOL TARTRATE 25 MG PO TABS
50.0000 mg | ORAL_TABLET | Freq: Once | ORAL | Status: AC
  Administered 2012-07-17: 50 mg via ORAL
  Filled 2012-07-17: qty 2

## 2012-07-17 MED ORDER — IOHEXOL 350 MG/ML SOLN
100.0000 mL | Freq: Once | INTRAVENOUS | Status: AC | PRN
  Administered 2012-07-17: 100 mL via INTRAVENOUS

## 2012-07-17 MED ORDER — METOPROLOL TARTRATE 1 MG/ML IV SOLN
10.0000 mg | Freq: Once | INTRAVENOUS | Status: AC
  Administered 2012-07-17: 10 mg via INTRAVENOUS

## 2012-07-17 MED ORDER — ASPIRIN 325 MG PO TABS
325.0000 mg | ORAL_TABLET | Freq: Once | ORAL | Status: AC
  Administered 2012-07-17: 325 mg via ORAL
  Filled 2012-07-17: qty 1

## 2012-07-17 MED ORDER — NITROGLYCERIN 0.4 MG SL SUBL
SUBLINGUAL_TABLET | SUBLINGUAL | Status: AC
  Administered 2012-07-17: 0.4 mg via SUBLINGUAL
  Filled 2012-07-17: qty 25

## 2012-07-17 MED ORDER — METOPROLOL TARTRATE 1 MG/ML IV SOLN
5.0000 mg | Freq: Once | INTRAVENOUS | Status: AC
  Administered 2012-07-17: 5 mg via INTRAVENOUS
  Filled 2012-07-17: qty 5

## 2012-07-17 MED ORDER — NITROGLYCERIN 0.4 MG SL SUBL
0.4000 mg | SUBLINGUAL_TABLET | Freq: Once | SUBLINGUAL | Status: AC
  Administered 2012-07-17: 0.4 mg via SUBLINGUAL

## 2012-07-17 MED ORDER — METOPROLOL TARTRATE 25 MG PO TABS
100.0000 mg | ORAL_TABLET | Freq: Once | ORAL | Status: AC
  Administered 2012-07-17: 100 mg via ORAL
  Filled 2012-07-17: qty 4

## 2012-07-17 NOTE — ED Notes (Addendum)
Running new i-Stat troponin at this time.  C.Rogan Wigley (ED Mini-Lab)

## 2012-07-17 NOTE — ED Provider Notes (Signed)
I saw and evaluated the patient, reviewed the resident's note and I agree with the findings and plan.  The patient's story is somewhat concerning as is his family history of heart disease.  His EKG is without significant abnormality except for possible inverted T wave in lead III. enzymes x2 are normal.  The patient is chest pain-free at this time.  Given the seriousness of his story and his risk factors include a CT coronary and serial cardiac enzymes is a reasonable approach for this patient.  The patient replaced in the CDU overnight for continued observation and a CT scan in the morning to evaluate his coronary arteries.  The CT scan will also hopefully better define this right lower lobe nodule that'll need additional evaluation  I personally evaluated the ECG and agree with the interpretation of the resident    Lyanne Co, MD 07/17/12 [phone]

## 2012-07-17 NOTE — ED Notes (Signed)
BMI: 29.6  Height: 5"11 Weight: 212 lbs

## 2012-07-17 NOTE — ED Provider Notes (Signed)
Cardiac CT shows some calcification, but no coronary artery obstructions.  Has some enlargement or his thoracic aorta and a 3mm lung nodule.  Discussed these findings with his PMD, Dr. Posey Rea who will follow pt from here after discharge.  Rolan Bucco, MD 07/17/12 1155

## 2012-07-17 NOTE — ED Notes (Signed)
PATIENT RETURNED FROM CT. UNABLE TO DO STUDY AT THIS TIME . WILL GIVE MORE MEDS AND TRY AGAIN IN AN HOUR

## 2012-07-17 NOTE — Progress Notes (Signed)
Observation review is complete. 

## 2012-07-17 NOTE — ED Notes (Addendum)
Attempted to start IV in LAC with no success.  IV Team notified.

## 2012-07-18 ENCOUNTER — Encounter: Payer: Self-pay | Admitting: *Deleted

## 2012-07-19 ENCOUNTER — Encounter (HOSPITAL_COMMUNITY): Payer: Self-pay | Admitting: Pharmacy Technician

## 2012-07-19 ENCOUNTER — Ambulatory Visit (INDEPENDENT_AMBULATORY_CARE_PROVIDER_SITE_OTHER): Payer: Managed Care, Other (non HMO) | Admitting: Cardiovascular Disease

## 2012-07-19 ENCOUNTER — Encounter: Payer: Self-pay | Admitting: Cardiovascular Disease

## 2012-07-19 VITALS — BP 126/64 | HR 78 | Ht 71.0 in | Wt 215.8 lb

## 2012-07-19 DIAGNOSIS — I2510 Atherosclerotic heart disease of native coronary artery without angina pectoris: Secondary | ICD-10-CM

## 2012-07-19 DIAGNOSIS — R0609 Other forms of dyspnea: Secondary | ICD-10-CM

## 2012-07-19 DIAGNOSIS — M25569 Pain in unspecified knee: Secondary | ICD-10-CM

## 2012-07-19 HISTORY — DX: Atherosclerotic heart disease of native coronary artery without angina pectoris: I25.10

## 2012-07-19 NOTE — Patient Instructions (Signed)
Your physician recommends that you schedule a follow-up appointment in: AS NEEDED  Your physician recommends that you continue on your current medications as directed. Please refer to the Current Medication list given to you today.  

## 2012-07-19 NOTE — Assessment & Plan Note (Signed)
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  F/U with Dr Paulette Blanch in am to prescribe and F/U labs in 3 months

## 2012-07-19 NOTE — Progress Notes (Signed)
Patient ID: [name], male   DOB: 1950/11/23, 61 y.o.   MRN: [id number] 61 yo of Dr Plotnicov referred by Dr Thomasena Edis for preop clearance.  Needs left TKR  Was just in ER for dyspnea, nausea and lightheadedness on 9/2.  W/U fairly unremarkable.  8/16 had some sort of ETT ? Using dobutamine which was normal.  In ER CXR with RLL nodule Labs ok.  Had cardiac CT which I reviewed on work station  3mm RML nodule No hemodynamically significant CAD Calcium Score 130 75th percentile Ascending Aorta 4.0 cm  Been fine since D/C.  He smokes occasional cigar.  Drinks on occasion.  No chest pain.  No bleeding diathesis and no previous anesthetic issues.   Last chol done 07/26/11 was TC 217 and HDL 38.  Normal ECG in ER  ROS: Denies fever, malais, weight loss, blurry vision, decreased visual acuity, cough, sputum, SOB, hemoptysis, pleuritic pain, palpitaitons, heartburn, abdominal pain, melena, lower extremity edema, claudication, or rash.  All other systems reviewed and negative   General: Affect appropriate Healthy:  appears stated age HEENT: normal Neck supple with no adenopathy JVP normal no bruits no thyromegaly Lungs clear with no wheezing and good diaphragmatic motion Heart:  S1/S2 no murmur,rub, gallop or click PMI normal Abdomen: benighn, BS positve, no tenderness, no AAA no bruit.  No HSM or HJR Distal pulses intact with no bruits No edema Neuro non-focal Skin warm and dry No muscular weakness  Medications Current Outpatient Prescriptions  Medication Sig Dispense Refill  . aspirin 81 MG tablet Take 81 mg by mouth daily.        . calcium gluconate 500 MG tablet Take 500 mg by mouth daily.      . celecoxib (CELEBREX) 200 MG capsule Take 200-400 mg by mouth daily as needed. For pain       . cholecalciferol (VITAMIN D) 400 UNITS TABS Take 400 Units by mouth daily.      . cyanocobalamin 500 MCG tablet Take 1,000 mcg by mouth daily.       . fish oil-omega-3 fatty acids 1000 MG capsule  Take 1 g by mouth daily.      . Ginkgo Biloba 60 MG CAPS Take 180 capsules by mouth daily.      . metoCLOPramide (REGLAN) 10 MG tablet Take 10 mg by mouth 4 (four) times daily.       . mometasone (NASONEX) 50 MCG/ACT nasal spray Place 2 sprays into the nose daily.      . Multiple Vitamin (MULTIVITAMIN WITH MINERALS) TABS Take 1 tablet by mouth daily.      . pantoprazole (PROTONIX) 40 MG tablet Take 40 mg by mouth daily.        . Potassium Chloride (KLOR-CON PO) Take 1 tablet by mouth daily.      . Red Yeast Rice 600 MG CAPS Take 1,200 capsules by mouth daily.      . traMADol (ULTRAM) 50 MG tablet Take 50-100 mg by mouth 2 (two) times daily as needed. For pain       . valACYclovir (VALTREX) 500 MG tablet Take 500 mg by mouth daily.      . Ascorbic Acid (VITAMIN C) 1000 MG tablet Take 1,000 mg by mouth daily.       Current Facility-Administered Medications  Medication Dose Route Frequency Provider Last Rate Last Dose  . methylPREDNISolone acetate (DEPO-MEDROL) injection 40 mg  40 mg Intra-articular Once Tresa Garter, MD  Allergies Penicillins  Family History: Family History  Problem Relation Age of Onset  . Coronary artery disease Other   . Diabetes Mother   . Heart disease Father 34    CAD, MI, CHF    Social History: History   Social History  . Marital Status: Married    Spouse Name: N/A    Number of Children: N/A  . Years of Education: N/A   Occupational History  . Not on file.   Social History Main Topics  . Smoking status: Current Some Day Smoker    Types: Cigars  . Smokeless tobacco: Not on file   Comment: occ. cigar  . Alcohol Use: Yes  . Drug Use: Yes     Marijuana socially   . Sexually Active: Yes   Other Topics Concern  . Not on file   Social History Narrative  . No narrative on file    Electrocardiogram:  07/18/12  NSR normal ECG    Assessment and Plan

## 2012-07-19 NOTE — Assessment & Plan Note (Signed)
Clear to have TKR with Dr Thomasena Edis NSAI;s

## 2012-07-19 NOTE — Assessment & Plan Note (Signed)
Etiology and symptoms from ER visit not clear. Benign lung nodule but no other critical cardiopulmonary issues.  Counseled about stopping all cigar smoking

## 2012-07-20 ENCOUNTER — Encounter: Payer: Self-pay | Admitting: Internal Medicine

## 2012-07-20 ENCOUNTER — Ambulatory Visit (INDEPENDENT_AMBULATORY_CARE_PROVIDER_SITE_OTHER): Payer: BC Managed Care – PPO | Admitting: Internal Medicine

## 2012-07-20 VITALS — BP 130/90 | HR 80 | Temp 98.1°F | Resp 16 | Wt 217.0 lb

## 2012-07-20 DIAGNOSIS — R0609 Other forms of dyspnea: Secondary | ICD-10-CM

## 2012-07-20 DIAGNOSIS — I2510 Atherosclerotic heart disease of native coronary artery without angina pectoris: Secondary | ICD-10-CM

## 2012-07-20 DIAGNOSIS — K219 Gastro-esophageal reflux disease without esophagitis: Secondary | ICD-10-CM

## 2012-07-20 DIAGNOSIS — K2270 Barrett's esophagus without dysplasia: Secondary | ICD-10-CM

## 2012-07-20 DIAGNOSIS — R0989 Other specified symptoms and signs involving the circulatory and respiratory systems: Secondary | ICD-10-CM

## 2012-07-20 DIAGNOSIS — Z23 Encounter for immunization: Secondary | ICD-10-CM

## 2012-07-20 DIAGNOSIS — R0600 Dyspnea, unspecified: Secondary | ICD-10-CM

## 2012-07-20 HISTORY — DX: Barrett's esophagus without dysplasia: K22.70

## 2012-07-20 NOTE — Progress Notes (Signed)
Subjective:    Patient ID: [name], male    DOB: 11-15-1950, 61 y.o.   MRN: [id number]  HPI  The patient is here for a post-hosp f/u: he was SOB after dinner on Mon - acute SOB that lasted for 1-2 h. He ate grilled shrimp w/sauce for dinner that his dtr cooked. He had an argument w/his dtr. He had some rash on R palm. No cough. L knee pain - worse. GERD is stable. C/o no more of DOE. He saw Dr Eden Emms   Past Medical History  Diagnosis Date  . OA (osteoarthritis) of knee   . Colonic polyp   . GERD (gastroesophageal reflux disease)   . HERPES, GENITAL NOS   . ONYCHOMYCOSIS   . Neoplasm of uncertain behavior of skin   . SINUSITIS, ACUTE   . CONTACT DERMATITIS DUE TO SOLVENTS   . Actinic keratosis   . OSTEOARTHRITIS   . KNEE PAIN   . COLONIC POLYPS, HX OF   . HAND PAIN   . Well adult exam   . DOE (dyspnea on exertion)   . Preop exam for internal medicine   . Ingrowing toenail of left foot    Past Surgical History  Procedure Date  . Knee arthroscopy     Left    reports that he has been smoking Cigars.  He does not have any smokeless tobacco history on file. He reports that he drinks alcohol. He reports that he uses illicit drugs. family history includes Coronary artery disease in his other; Diabetes in his mother; and Heart disease (age of onset:50) in his father. Allergies  Allergen Reactions  . Penicillins Other (See Comments)    Convulsions   Current Outpatient Prescriptions on File Prior to Visit  Medication Sig Dispense Refill  . Ascorbic Acid (VITAMIN C) 1000 MG tablet Take 1,000 mg by mouth daily.      [name] aspirin 81 MG tablet Take 81 mg by mouth daily.        . calcium gluconate 500 MG tablet Take 500 mg by mouth daily.      . celecoxib (CELEBREX) 200 MG capsule Take 200-400 mg by mouth daily as needed. For pain       . cholecalciferol (VITAMIN D) 400 UNITS TABS Take 400 Units by mouth daily.      . cyanocobalamin 500 MCG tablet Take 1,000 mcg by mouth daily.        . fish oil-omega-3 fatty acids 1000 MG capsule Take 1 g by mouth daily.      . Ginkgo Biloba 60 MG CAPS Take 180 capsules by mouth daily.      . metoCLOPramide (REGLAN) 10 MG tablet Take 10 mg by mouth 4 (four) times daily.       . mometasone (NASONEX) 50 MCG/ACT nasal spray Place 2 sprays into the nose daily.      . Multiple Vitamin (MULTIVITAMIN WITH MINERALS) TABS Take 1 tablet by mouth daily.      . pantoprazole (PROTONIX) 40 MG tablet Take 40 mg by mouth daily.        . Potassium Chloride (KLOR-CON PO) Take 1 tablet by mouth daily.      . Red Yeast Rice 600 MG CAPS Take 1,200 capsules by mouth daily.      . traMADol (ULTRAM) 50 MG tablet Take 50-100 mg by mouth 2 (two) times daily as needed. For pain       . valACYclovir (VALTREX) 500 MG tablet Take 500  mg by mouth daily.       Current Facility-Administered Medications on File Prior to Visit  Medication Dose Route Frequency Provider Last Rate Last Dose  . methylPREDNISolone acetate (DEPO-MEDROL) injection 40 mg  40 mg Intra-articular Once Georgina Quint Plotnikov, MD       BP 130/90  Pulse 80  Temp 98.1 F (36.7 C) (Oral)  Resp 16  Wt 217 lb (98.431 kg)  Review of Systems  Constitutional: Negative for appetite change, fatigue and unexpected weight change.  HENT: Negative for nosebleeds, congestion, sore throat, sneezing, trouble swallowing and neck pain.   Eyes: Negative for itching and visual disturbance.  Respiratory: Negative for cough.   Cardiovascular: Negative for chest pain, palpitations and leg swelling.  Gastrointestinal: Negative for nausea, diarrhea, blood in stool and abdominal distention.  Genitourinary: Negative for frequency and hematuria.  Musculoskeletal: Positive for arthralgias (knees) and gait problem. Negative for back pain and joint swelling.  Skin: Negative for rash.  Neurological: Negative for dizziness, tremors, speech difficulty and weakness.  Psychiatric/Behavioral: Negative for disturbed wake/sleep  cycle, dysphoric mood and agitation. The patient is not nervous/anxious.    Wt Readings from Last 3 Encounters:  07/20/12 217 lb (98.431 kg)  07/19/12 215 lb 12.8 oz (97.886 kg)  07/17/12 212 lb (96.163 kg)   BP Readings from Last 3 Encounters:  07/20/12 130/90  07/19/12 126/64  07/17/12 131/82        Objective:   Physical Exam  Constitutional: He is oriented to person, place, and time. He appears well-developed and well-nourished. No distress.  HENT:  Head: Normocephalic and atraumatic.  Right Ear: External ear normal.  Left Ear: External ear normal.  Nose: Nose normal.  Mouth/Throat: Oropharynx is clear and moist. No oropharyngeal exudate.  Eyes: Conjunctivae and EOM are normal. Pupils are equal, round, and reactive to light. Right eye exhibits no discharge. Left eye exhibits no discharge. No scleral icterus.  Neck: Normal range of motion. Neck supple. No JVD present. No tracheal deviation present. No thyromegaly present.  Cardiovascular: Normal rate, regular rhythm, normal heart sounds and intact distal pulses.  Exam reveals no gallop and no friction rub.   No murmur heard. Pulmonary/Chest: Effort normal and breath sounds normal. No stridor. No respiratory distress. He has no wheezes. He has no rales. He exhibits no tenderness.  Abdominal: Soft. Bowel sounds are normal. He exhibits no distension and no mass. There is no tenderness. There is no rebound and no guarding.  Genitourinary: Rectum normal, prostate normal and penis normal. Guaiac negative stool. No penile tenderness.  Musculoskeletal: Normal range of motion. He exhibits no edema and no tenderness.       B knees are tender  Lymphadenopathy:    He has no cervical adenopathy.  Neurological: He is alert and oriented to person, place, and time. He has normal reflexes. No cranial nerve deficit. He exhibits normal muscle tone. Coordination normal.  Skin: Skin is warm and dry. No rash noted. He is not diaphoretic. No erythema.  No pallor.  Psychiatric: He has a normal mood and affect. His behavior is normal. Judgment and thought content normal.  mld eczema R palm  Lab Results  Component Value Date   WBC 7.0 07/16/2012   HGB 16.0 07/16/2012   HCT 47.0 07/16/2012   PLT 265 07/16/2012   CHOL 217* 07/26/2011   TRIG 277.0* 07/26/2011   HDL 38.70* 07/26/2011   LDLDIRECT 121.6 07/26/2011   ALT 32 06/18/2012   AST 22 06/18/2012   NA  142 07/16/2012   K 3.6 07/16/2012   CL 105 07/16/2012   CREATININE 0.90 07/16/2012   BUN 12 07/16/2012   CO2 24 07/16/2012   TSH 3.25 06/18/2012   PSA 0.69 07/26/2011   HGBA1C 5.6 05/02/2008   Hosp records, tests, office notes ere reviewed      Assessment & Plan:   A complex case

## 2012-07-20 NOTE — Assessment & Plan Note (Signed)
Stress ECHO - nl

## 2012-07-20 NOTE — Assessment & Plan Note (Addendum)
Continue with current prescription therapy as reflected on the Med list. I suggested to stop Reglan

## 2012-07-20 NOTE — Assessment & Plan Note (Addendum)
Nonobstructive - CT 9/13 Dr Eden Emms Nl Dob ECHO - 8/13 Statins discussed (he is on red rice yeast) - he declined Rx

## 2012-07-20 NOTE — Assessment & Plan Note (Addendum)
9/13 - ?etiology. Hosp records, tests were reviewd R/o allergic reaction etc vs other. Doubt a panic attack Food allergy IgE panel suggested - he declined D/c Ca, Vit C

## 2012-07-20 NOTE — Assessment & Plan Note (Signed)
I would stop Reglan - discussed

## 2012-07-25 ENCOUNTER — Encounter (HOSPITAL_COMMUNITY)
Admission: RE | Admit: 2012-07-25 | Discharge: 2012-07-25 | Disposition: A | Discharge status: Home / Self Care | Payer: BC Managed Care – PPO | Source: Ambulatory Visit | Attending: Specialist | Admitting: Specialist

## 2012-07-25 ENCOUNTER — Encounter (HOSPITAL_COMMUNITY): Payer: Self-pay

## 2012-07-25 DIAGNOSIS — L600 Ingrowing nail: Secondary | ICD-10-CM

## 2012-07-25 DIAGNOSIS — R0609 Other forms of dyspnea: Secondary | ICD-10-CM

## 2012-07-25 DIAGNOSIS — R0600 Dyspnea, unspecified: Secondary | ICD-10-CM | POA: Insufficient documentation

## 2012-07-25 HISTORY — DX: Dyspnea, unspecified: R06.00

## 2012-07-25 HISTORY — PX: UMBILICAL HERNIA REPAIR: SHX196

## 2012-07-25 HISTORY — DX: Ingrowing nail: L60.0

## 2012-07-25 HISTORY — DX: Other forms of dyspnea: R06.09

## 2012-07-25 LAB — COMPREHENSIVE METABOLIC PANEL
ALT: 25 U/L (ref 0–53)
Albumin: 4 g/dL (ref 3.5–5.2)
Alkaline Phosphatase: 58 U/L (ref 39–117)
Potassium: 4 mEq/L (ref 3.5–5.1)
Sodium: 138 mEq/L (ref 135–145)
Total Protein: 7.2 g/dL (ref 6.0–8.3)

## 2012-07-25 LAB — URINALYSIS, ROUTINE W REFLEX MICROSCOPIC
Glucose, UA: NEGATIVE mg/dL
Hgb urine dipstick: NEGATIVE
Specific Gravity, Urine: 1.018 (ref 1.005–1.030)
Urobilinogen, UA: 1 mg/dL (ref 0.0–1.0)

## 2012-07-25 LAB — PROTIME-INR: Prothrombin Time: 12.4 seconds (ref 11.6–15.2)

## 2012-07-25 LAB — SURGICAL PCR SCREEN
MRSA, PCR: NEGATIVE
Staphylococcus aureus: NEGATIVE

## 2012-07-25 LAB — APTT: aPTT: 31 seconds (ref 24–37)

## 2012-07-25 NOTE — Patient Instructions (Addendum)
20 [name]  07/25/2012   Your procedure is scheduled on:   07-31-2012  Report to [facility] at    1200 noon    .  Call this number if you have problems the morning of surgery: [phone]  Or Presurgical Testing [phone] [name]   Remember:   Do not eat food:After Midnight.  May have clear liquids:up to 6 Hours before arrival. Nothing after : 0800  Clear liquids include soda, tea, black coffee, apple or grape juice, broth.  Take these medicines the morning of surgery with A SIP OF WATER:  Metoclopramide, Pantoprazole, Valtrex.  Tylenol.   Do not wear jewelry, make-up or nail polish.  Do not wear lotions, powders, or perfumes. You may wear deodorant.  Do not shave 48 hours prior to surgery.(face and neck okay, no shaving of legs)  Do not bring valuables to the hospital.  Contacts, dentures or bridgework may not be worn into surgery.  Leave suitcase in the car. After surgery it may be brought to your room.  For patients admitted to the hospital, checkout time is 11:00 AM the day of discharge.   Patients discharged the day of surgery will not be allowed to drive home.  Name and phone number of your driver: spouse-[name] [phone] cell  Special Instructions: CHG Shower Use Special Wash: 1/2 bottle night before surgery and 1/2 bottle morning of surgery.(avoid face and genitals)   Please read over the following fact sheets that you were given: MRSA Information, Blood Transfusion fact sheet, Incentive Spirometry Instruction.

## 2012-07-25 NOTE — Pre-Procedure Instructions (Addendum)
07-25-12 EKG/ Stress test / CXRresults in Epic. CXR/CT reports faxed to Dr. Thomasena Edis office 07-17-12 for review by Dorise Bullion.W. Monisha Siebel,RN 07-26-12 1000- Pt. Made aware of surgery time now 1500, and to arrive by 1230 PM on 07-31-12.W. Hendick,RN

## 2012-07-26 NOTE — H&P (Signed)
NAME:  [name], [name]                ACCOUNT NO.:  [id number]  MEDICAL RECORD NO.:  [id number]  LOCATION:                               FACILITY:  [facility]  PHYSICIAN:  [name], M.D.DATE OF BIRTH:  04/30/1951  DATE OF ADMISSION:  07/31/2012 DATE OF DISCHARGE:                             HISTORY & PHYSICAL   ADMISSION DIAGNOSES: 1. End-stage osteoarthritis, left knee, failed conservative treatment. 2. History of Barrett esophagitis. 3. History of hemorrhoids.  HISTORY OF PRESENT ILLNESS:  The patient is a 61-year-old male well known to Dr. Collins for evaluation of his left knee.  The patient has had treatments to include arthroscopies of the knee and injections.  He has failed conservative treatments.  He continued to have pain and difficulty with ambulation and with activities of daily living.  The patient and the doctor discussed further treatments to include total knee arthroplasty.  The patient was given pros and cons, possible complications and he elected to proceed.  His x-rays in the office, review of weightbearing films, he has bone-on-bone medial compartment, he has lateral degenerative compartment changes.  He has got varus deformity.  He has got significant patellofemoral degenerative changes.  ALLERGIES:  PENICILLIN.  Has not had it since child.  He did have seizures with penicillin in the past.  PRIMARY CARE PHYSICIAN:  Andre V. Plotnikov, MD  CARDIOLOGIST:  Andre C. Nishan, MD, FACC  MEDICATIONS: 1. Metoclopramide 110 mg b.i.d. 2. Pantoprazole 40 mg b.i.d. 3. Tramadol 50 mg 2 tablets b.i.d. 4. Celebrex 200 mg once a day. 5. Valacyclovir 500 mg once a day. 6. Nasonex once a day. 7. Multiple vitamin supplements over the counter.  PAST MEDICAL HISTORY:  Includes positive for: 1. Barrett esophagitis. 2. Hemorrhoids. 3. Advanced osteoarthritis, left knee.  REVIEW OF SYSTEMS:  Negative for any strokes, convulsions, numbness or tingling.  He did  have seizures with penicillin as a child.  He does wear glasses.  He does have upper bridge partial.  PULMONARY:  He denies any problems related to any asthma, bronchitis, pneumonia, COPD, emphysema, or history of tuberculosis.  He did recently have a shortness of breath episode.  He was evaluated in the emergency room.  No cardiac or respiratory etiology noted.  The primary care physician felt possibly an allergic reactions or something.  CARDIOVASCULAR:  He denies any chest pain, chest pressure, shortness of breath with activities, no sense of irregular heart rhythm, beating too fast, too slow.  He has recently had a significant cardiac workup for preoperative evaluation through Dr. Nishan's office, which is unremarkable.  GI:  He denies any unusual constipation, diarrhea, nausea or vomiting.  No history of significant ulcers.  He does have a history of Barrett esophagitis.  He does have hemorrhoids.  He denies any jaundice, abdominal discomfort, hepatitis, gallbladder issues, diverticular problems.  GU:  He denies any problems with urination.  No significant problems with nocturia, pain on urination, burning on urination.  No history of kidney stones. ENDOCRINE:  He denies any temperature intolerance.  No unusual thirst or urination.  He does report borderline diabetes about 110s, but he is controlling it with diet.  No medications.    HEMATOLOGIC:  He denies any problems with anemia in the past.  No blood transfusions, no blood clots, no cancers.  PAST SURGICAL HISTORY:  Includes left knee scope and umbilical hernia without any complications from anesthesia.  FAMILY MEDICAL HISTORY:  His dad is deceased in his mid 50s from a history of a stroke and had a history of MIs.  Mother is alive at the age of 93 with Alzheimer.  SOCIAL HISTORY:  The patient is married, lives with his wife.  He does gets smoke on occasional cigar, very rare alcohol, no street drugs.  He lives with his family,  will care from him, postop.  PHYSICAL EXAMINATION:  VITAL SIGNS:  Height is 5 feet and 11 inches, weight is 215 pounds.  Blood pressure is 118/72, pulse of 80 and regular, respirations are about 14 and nonlabored. GENERAL:  This is a healthy-appearing, well-developed gentleman who walks with a slight left-sided limp. HEENT.  Head is normocephalic.  Pupils are equal, round, and reactive. Gross hearing is intact. NECK:  Supple.  No palpable lymphadenopathy.  Good range of motion. CHEST:  Lung sounds were clear throughout. HEART:  Regular rate and rhythm. ABDOMEN:  Soft.  Bowel sounds present. EXTREMITIES:  Upper extremities, he had good range of motion and good motor strength in both upper extremities.  Lower extremities, he had full range of motion in his hip, knee and ankle without any difficulty. His left knee, he has slight varus deformity.  He was able to extend his knee to about 5 degrees short of full extension, flexion back to about 115.  He did have crepitus in knee with motion.  His calf was soft, nontender.  Good motion of the ankle and the hip without any difficulty. Peripheral vascular; carotid pulses were 2+.  No bruits.  Radial pulses were 2+.  Posterior tibial pulses, he had no lower extremity edema or venous stasis changes. BREAST:  Deferred. RECTAL:  Deferred. GU:  Deferred.  IMPRESSION: 1. End-stage osteoarthritis, left knee. 2. History of Barrett esophagitis. 3. History of hemorrhoids.  PLAN:  The patient will undergo all routine labs and tests prior to having a left total knee arthroplasty by Dr. Collins at [name] on July 31, 2012.  The patient has been evaluated and cleared by Dr. Plotnikov and then once again by Dr. Nishan, post shortness of breath event in the emergency room.     [name], P.A.   ______________________________ [name], M.D.    [name]  D:  07/25/2012  T:  07/26/2012  Job:  [id number] 

## 2012-07-31 ENCOUNTER — Encounter (HOSPITAL_COMMUNITY)
Admission: RE | Disposition: A | Discharge status: Home Health | Payer: Self-pay | Source: Ambulatory Visit | Attending: Specialist

## 2012-07-31 ENCOUNTER — Encounter (HOSPITAL_COMMUNITY): Payer: Self-pay | Admitting: *Deleted

## 2012-07-31 ENCOUNTER — Inpatient Hospital Stay (HOSPITAL_COMMUNITY)
Admission: RE | Admit: 2012-07-31 | Discharge: 2012-08-03 | DRG: 209 | Disposition: A | Discharge status: Home Health | Payer: BC Managed Care – PPO | Source: Ambulatory Visit | Attending: Specialist | Admitting: Specialist

## 2012-07-31 ENCOUNTER — Encounter (HOSPITAL_COMMUNITY): Payer: Self-pay | Admitting: Anesthesiology

## 2012-07-31 ENCOUNTER — Ambulatory Visit (HOSPITAL_COMMUNITY): Payer: BC Managed Care – PPO | Admitting: Anesthesiology

## 2012-07-31 DIAGNOSIS — B351 Tinea unguium: Secondary | ICD-10-CM

## 2012-07-31 DIAGNOSIS — M1710 Unilateral primary osteoarthritis, unspecified knee: Principal | ICD-10-CM | POA: Diagnosis present

## 2012-07-31 DIAGNOSIS — Z01818 Encounter for other preprocedural examination: Secondary | ICD-10-CM

## 2012-07-31 DIAGNOSIS — L570 Actinic keratosis: Secondary | ICD-10-CM

## 2012-07-31 DIAGNOSIS — J019 Acute sinusitis, unspecified: Secondary | ICD-10-CM

## 2012-07-31 DIAGNOSIS — Z86010 Personal history of colonic polyps: Secondary | ICD-10-CM

## 2012-07-31 DIAGNOSIS — Z01812 Encounter for preprocedural laboratory examination: Secondary | ICD-10-CM

## 2012-07-31 DIAGNOSIS — D485 Neoplasm of uncertain behavior of skin: Secondary | ICD-10-CM

## 2012-07-31 DIAGNOSIS — M1990 Unspecified osteoarthritis, unspecified site: Secondary | ICD-10-CM

## 2012-07-31 DIAGNOSIS — M79609 Pain in unspecified limb: Secondary | ICD-10-CM

## 2012-07-31 DIAGNOSIS — Z96659 Presence of unspecified artificial knee joint: Secondary | ICD-10-CM

## 2012-07-31 DIAGNOSIS — M25569 Pain in unspecified knee: Secondary | ICD-10-CM

## 2012-07-31 DIAGNOSIS — A6000 Herpesviral infection of urogenital system, unspecified: Secondary | ICD-10-CM

## 2012-07-31 DIAGNOSIS — Z0000 Encounter for general adult medical examination without abnormal findings: Secondary | ICD-10-CM

## 2012-07-31 DIAGNOSIS — K219 Gastro-esophageal reflux disease without esophagitis: Secondary | ICD-10-CM | POA: Diagnosis present

## 2012-07-31 DIAGNOSIS — R0600 Dyspnea, unspecified: Secondary | ICD-10-CM

## 2012-07-31 DIAGNOSIS — I2510 Atherosclerotic heart disease of native coronary artery without angina pectoris: Secondary | ICD-10-CM | POA: Diagnosis present

## 2012-07-31 DIAGNOSIS — L242 Irritant contact dermatitis due to solvents: Secondary | ICD-10-CM

## 2012-07-31 DIAGNOSIS — L600 Ingrowing nail: Secondary | ICD-10-CM

## 2012-07-31 DIAGNOSIS — K2270 Barrett's esophagus without dysplasia: Secondary | ICD-10-CM

## 2012-07-31 HISTORY — PX: TOTAL KNEE ARTHROPLASTY: SHX125

## 2012-07-31 LAB — TYPE AND SCREEN: Antibody Screen: NEGATIVE

## 2012-07-31 SURGERY — ARTHROPLASTY, KNEE, TOTAL
Anesthesia: Spinal | Site: Knee | Laterality: Left | Wound class: Clean

## 2012-07-31 MED ORDER — KETOROLAC TROMETHAMINE 60 MG/2ML IM SOLN
60.0000 mg | Freq: Once | INTRAMUSCULAR | Status: AC | PRN
  Filled 2012-07-31: qty 2

## 2012-07-31 MED ORDER — ONDANSETRON HCL 4 MG/2ML IJ SOLN
INTRAMUSCULAR | Status: DC | PRN
  Administered 2012-07-31: 4 mg via INTRAVENOUS

## 2012-07-31 MED ORDER — DIPHENHYDRAMINE HCL 50 MG/ML IJ SOLN: 25.0000 mg | INTRAMUSCULAR | Status: DC | PRN

## 2012-07-31 MED ORDER — PANTOPRAZOLE SODIUM 40 MG PO TBEC
40.0000 mg | DELAYED_RELEASE_TABLET | Freq: Every day | ORAL | Status: DC
  Administered 2012-08-01 – 2012-08-03 (×3): 40 mg via ORAL
  Filled 2012-07-31 (×3): qty 1

## 2012-07-31 MED ORDER — ONDANSETRON HCL 4 MG/2ML IJ SOLN
4.0000 mg | Freq: Three times a day (TID) | INTRAMUSCULAR | Status: DC | PRN
Start: 2012-07-31 — End: 2012-07-31

## 2012-07-31 MED ORDER — FERROUS SULFATE 325 (65 FE) MG PO TABS
325.0000 mg | ORAL_TABLET | Freq: Three times a day (TID) | ORAL | Status: DC
  Administered 2012-08-01 – 2012-08-03 (×5): 325 mg via ORAL
  Filled 2012-07-31 (×10): qty 1

## 2012-07-31 MED ORDER — KETOROLAC TROMETHAMINE 30 MG/ML IJ SOLN: 30.0000 mg | Freq: Four times a day (QID) | INTRAMUSCULAR | Status: DC | PRN

## 2012-07-31 MED ORDER — SODIUM CHLORIDE 0.9 % IJ SOLN: 3.0000 mL | INTRAMUSCULAR | Status: DC | PRN

## 2012-07-31 MED ORDER — KETOROLAC TROMETHAMINE 30 MG/ML IJ SOLN
INTRAMUSCULAR | Status: DC | PRN
  Administered 2012-07-31: 30 mg

## 2012-07-31 MED ORDER — ACETAMINOPHEN 650 MG RE SUPP: 650.0000 mg | Freq: Four times a day (QID) | RECTAL | Status: DC | PRN

## 2012-07-31 MED ORDER — PROMETHAZINE HCL 25 MG/ML IJ SOLN: 6.2500 mg | INTRAMUSCULAR | Status: DC | PRN

## 2012-07-31 MED ORDER — POVIDONE-IODINE 7.5 % EX SOLN: Freq: Once | CUTANEOUS | Status: DC

## 2012-07-31 MED ORDER — POLYETHYLENE GLYCOL 3350 17 G PO PACK: 17.0000 g | PACK | Freq: Every day | ORAL | Status: DC | PRN

## 2012-07-31 MED ORDER — ACETAMINOPHEN 10 MG/ML IV SOLN
INTRAVENOUS | Status: AC
  Filled 2012-07-31: qty 100

## 2012-07-31 MED ORDER — VANCOMYCIN HCL IN DEXTROSE 1-5 GM/200ML-% IV SOLN
INTRAVENOUS | Status: AC
  Filled 2012-07-31: qty 200

## 2012-07-31 MED ORDER — PHENOL 1.4 % MT LIQD: 1.0000 | OROMUCOSAL | Status: DC | PRN

## 2012-07-31 MED ORDER — LIDOCAINE HCL (CARDIAC) 20 MG/ML IV SOLN
INTRAVENOUS | Status: DC | PRN
  Administered 2012-07-31: 100 mg via INTRAVENOUS

## 2012-07-31 MED ORDER — METOCLOPRAMIDE HCL 10 MG PO TABS
5.0000 mg | ORAL_TABLET | Freq: Three times a day (TID) | ORAL | Status: DC | PRN
  Administered 2012-08-01: 10 mg via ORAL

## 2012-07-31 MED ORDER — ALUM & MAG HYDROXIDE-SIMETH 200-200-20 MG/5ML PO SUSP: 30.0000 mL | ORAL | Status: DC | PRN

## 2012-07-31 MED ORDER — MORPHINE SULFATE (PF) 0.5 MG/ML IJ SOLN
INTRAMUSCULAR | Status: DC | PRN
  Administered 2012-07-31: .05 mg via EPIDURAL

## 2012-07-31 MED ORDER — METOCLOPRAMIDE HCL 5 MG/ML IJ SOLN: 10.0000 mg | Freq: Three times a day (TID) | INTRAMUSCULAR | Status: DC | PRN

## 2012-07-31 MED ORDER — BISACODYL 10 MG RE SUPP
10.0000 mg | Freq: Every day | RECTAL | Status: DC | PRN
  Administered 2012-08-02: 10 mg via RECTAL
  Filled 2012-07-31: qty 1

## 2012-07-31 MED ORDER — VANCOMYCIN HCL IN DEXTROSE 1-5 GM/200ML-% IV SOLN
1000.0000 mg | Freq: Once | INTRAVENOUS | Status: AC
  Administered 2012-08-01: 1000 mg via INTRAVENOUS
  Filled 2012-07-31: qty 200

## 2012-07-31 MED ORDER — FLEET ENEMA 7-19 GM/118ML RE ENEM: 1.0000 | ENEMA | Freq: Once | RECTAL | Status: AC | PRN

## 2012-07-31 MED ORDER — DIPHENHYDRAMINE HCL 50 MG/ML IJ SOLN: 12.5000 mg | INTRAMUSCULAR | Status: DC | PRN

## 2012-07-31 MED ORDER — POTASSIUM CHLORIDE IN NACL 20-0.9 MEQ/L-% IV SOLN
INTRAVENOUS | Status: DC
  Administered 2012-07-31: 21:00:00 via INTRAVENOUS
  Filled 2012-07-31 (×3): qty 1000

## 2012-07-31 MED ORDER — MEPERIDINE HCL 50 MG/ML IJ SOLN: 6.2500 mg | INTRAMUSCULAR | Status: DC | PRN

## 2012-07-31 MED ORDER — PROPOFOL INFUSION 10 MG/ML OPTIME
INTRAVENOUS | Status: DC | PRN
  Administered 2012-07-31: 75 ug/kg/min via INTRAVENOUS

## 2012-07-31 MED ORDER — ZOLPIDEM TARTRATE 5 MG PO TABS: 5.0000 mg | ORAL_TABLET | Freq: Every evening | ORAL | Status: DC | PRN

## 2012-07-31 MED ORDER — LACTATED RINGERS IV SOLN: INTRAVENOUS | Status: DC

## 2012-07-31 MED ORDER — METOCLOPRAMIDE HCL 5 MG/ML IJ SOLN: 5.0000 mg | Freq: Three times a day (TID) | INTRAMUSCULAR | Status: DC | PRN

## 2012-07-31 MED ORDER — OXYCODONE HCL 5 MG PO TABS
5.0000 mg | ORAL_TABLET | ORAL | Status: DC | PRN
  Administered 2012-08-01 (×3): 10 mg via ORAL
  Administered 2012-08-01: 5 mg via ORAL
  Administered 2012-08-01 – 2012-08-03 (×9): 10 mg via ORAL
  Filled 2012-07-31 (×9): qty 2
  Filled 2012-07-31: qty 1
  Filled 2012-07-31 (×3): qty 2

## 2012-07-31 MED ORDER — BUPIVACAINE-EPINEPHRINE PF 0.25-1:200000 % IJ SOLN
INTRAMUSCULAR | Status: AC
  Filled 2012-07-31: qty 60

## 2012-07-31 MED ORDER — ONDANSETRON HCL 4 MG PO TABS: 4.0000 mg | ORAL_TABLET | Freq: Four times a day (QID) | ORAL | Status: DC | PRN

## 2012-07-31 MED ORDER — KETOROLAC TROMETHAMINE 30 MG/ML IJ SOLN
INTRAMUSCULAR | Status: AC
  Filled 2012-07-31: qty 1

## 2012-07-31 MED ORDER — NALOXONE HCL 0.4 MG/ML IJ SOLN: 0.4000 mg | INTRAMUSCULAR | Status: DC | PRN

## 2012-07-31 MED ORDER — METHOCARBAMOL 500 MG PO TABS
500.0000 mg | ORAL_TABLET | Freq: Four times a day (QID) | ORAL | Status: DC | PRN
  Administered 2012-08-01 – 2012-08-03 (×6): 500 mg via ORAL
  Filled 2012-07-31 (×6): qty 1

## 2012-07-31 MED ORDER — FENTANYL CITRATE 0.05 MG/ML IJ SOLN
INTRAMUSCULAR | Status: DC | PRN
  Administered 2012-07-31 (×2): 50 ug via INTRAVENOUS

## 2012-07-31 MED ORDER — DIPHENHYDRAMINE HCL 25 MG PO CAPS
25.0000 mg | ORAL_CAPSULE | ORAL | Status: DC | PRN
  Filled 2012-07-31: qty 1

## 2012-07-31 MED ORDER — MORPHINE SULFATE 0.5 MG/ML IJ SOLN
INTRAMUSCULAR | Status: AC
  Filled 2012-07-31: qty 10

## 2012-07-31 MED ORDER — DEXTROSE 5 % IV SOLN
500.0000 mg | Freq: Four times a day (QID) | INTRAVENOUS | Status: DC | PRN
  Filled 2012-07-31 (×2): qty 5

## 2012-07-31 MED ORDER — CEFAZOLIN SODIUM-DEXTROSE 2-3 GM-% IV SOLR: 2.0000 g | Freq: Four times a day (QID) | INTRAVENOUS | Status: DC

## 2012-07-31 MED ORDER — SODIUM CHLORIDE 0.9 % IR SOLN
Status: DC | PRN
  Administered 2012-07-31: 3000 mL

## 2012-07-31 MED ORDER — VITAMIN B-12 1000 MCG PO TABS
1000.0000 ug | ORAL_TABLET | Freq: Every day | ORAL | Status: DC
  Administered 2012-07-31 – 2012-08-03 (×4): 1000 ug via ORAL
  Filled 2012-07-31 (×4): qty 1

## 2012-07-31 MED ORDER — VALACYCLOVIR HCL 500 MG PO TABS
500.0000 mg | ORAL_TABLET | Freq: Every day | ORAL | Status: DC
  Administered 2012-08-01 – 2012-08-03 (×3): 500 mg via ORAL
  Filled 2012-07-31 (×3): qty 1

## 2012-07-31 MED ORDER — ACETAMINOPHEN 325 MG PO TABS
650.0000 mg | ORAL_TABLET | Freq: Four times a day (QID) | ORAL | Status: DC | PRN
  Administered 2012-08-02: 650 mg via ORAL
  Filled 2012-07-31: qty 2

## 2012-07-31 MED ORDER — VANCOMYCIN HCL IN DEXTROSE 1-5 GM/200ML-% IV SOLN
1000.0000 mg | INTRAVENOUS | Status: AC
  Administered 2012-07-31: 1000 mg via INTRAVENOUS

## 2012-07-31 MED ORDER — ACETAMINOPHEN 10 MG/ML IV SOLN
1000.0000 mg | Freq: Four times a day (QID) | INTRAVENOUS | Status: AC
  Administered 2012-07-31 – 2012-08-01 (×4): 1000 mg via INTRAVENOUS
  Filled 2012-07-31 (×5): qty 100

## 2012-07-31 MED ORDER — LACTATED RINGERS IV SOLN
INTRAVENOUS | Status: DC | PRN
  Administered 2012-07-31 (×2): via INTRAVENOUS

## 2012-07-31 MED ORDER — NALBUPHINE HCL 10 MG/ML IJ SOLN
5.0000 mg | INTRAMUSCULAR | Status: DC | PRN
  Filled 2012-07-31: qty 1

## 2012-07-31 MED ORDER — ONE-DAILY MULTI VITAMINS PO TABS: 1.0000 | ORAL_TABLET | Freq: Every day | ORAL | Status: DC

## 2012-07-31 MED ORDER — CELECOXIB 200 MG PO CAPS
200.0000 mg | ORAL_CAPSULE | Freq: Every day | ORAL | Status: DC
  Administered 2012-07-31 – 2012-08-03 (×4): 200 mg via ORAL
  Filled 2012-07-31 (×4): qty 1

## 2012-07-31 MED ORDER — 0.9 % SODIUM CHLORIDE (POUR BTL) OPTIME
TOPICAL | Status: DC | PRN
  Administered 2012-07-31: 1000 mL

## 2012-07-31 MED ORDER — DIPHENHYDRAMINE HCL 12.5 MG/5ML PO ELIX: 12.5000 mg | ORAL_SOLUTION | ORAL | Status: DC | PRN

## 2012-07-31 MED ORDER — MENTHOL 3 MG MT LOZG: 1.0000 | LOZENGE | OROMUCOSAL | Status: DC | PRN

## 2012-07-31 MED ORDER — ADULT MULTIVITAMIN W/MINERALS CH
1.0000 | ORAL_TABLET | Freq: Every day | ORAL | Status: DC
  Administered 2012-07-31 – 2012-08-03 (×4): 1 via ORAL
  Filled 2012-07-31 (×4): qty 1

## 2012-07-31 MED ORDER — BUPIVACAINE IN DEXTROSE 0.75-8.25 % IT SOLN
INTRATHECAL | Status: DC | PRN
  Administered 2012-07-31: 1.8 mL via INTRATHECAL

## 2012-07-31 MED ORDER — HYDROMORPHONE HCL PF 1 MG/ML IJ SOLN
0.5000 mg | INTRAMUSCULAR | Status: DC | PRN
  Administered 2012-08-01: 1 mg via INTRAVENOUS
  Filled 2012-07-31: qty 1

## 2012-07-31 MED ORDER — ACETAMINOPHEN 10 MG/ML IV SOLN
INTRAVENOUS | Status: DC | PRN
  Administered 2012-07-31: 1000 mg via INTRAVENOUS

## 2012-07-31 MED ORDER — HYDROMORPHONE HCL PF 1 MG/ML IJ SOLN: 0.2500 mg | INTRAMUSCULAR | Status: DC | PRN

## 2012-07-31 MED ORDER — SODIUM CHLORIDE 0.9 % IV SOLN: INTRAVENOUS | Status: DC

## 2012-07-31 MED ORDER — CHOLECALCIFEROL 10 MCG (400 UNIT) PO TABS
400.0000 [IU] | ORAL_TABLET | Freq: Every day | ORAL | Status: DC
  Administered 2012-07-31 – 2012-08-03 (×4): 400 [IU] via ORAL
  Filled 2012-07-31 (×4): qty 1

## 2012-07-31 MED ORDER — ENOXAPARIN SODIUM 30 MG/0.3ML ~~LOC~~ SOLN
30.0000 mg | Freq: Two times a day (BID) | SUBCUTANEOUS | Status: DC
  Administered 2012-08-01 – 2012-08-03 (×5): 30 mg via SUBCUTANEOUS
  Filled 2012-07-31 (×7): qty 0.3

## 2012-07-31 MED ORDER — MIDAZOLAM HCL 5 MG/5ML IJ SOLN
INTRAMUSCULAR | Status: DC | PRN
  Administered 2012-07-31: 2 mg via INTRAVENOUS

## 2012-07-31 MED ORDER — DOCUSATE SODIUM 100 MG PO CAPS
100.0000 mg | ORAL_CAPSULE | Freq: Two times a day (BID) | ORAL | Status: DC
  Administered 2012-07-31 – 2012-08-03 (×6): 100 mg via ORAL

## 2012-07-31 MED ORDER — SCOPOLAMINE 1 MG/3DAYS TD PT72
1.0000 | MEDICATED_PATCH | Freq: Once | TRANSDERMAL | Status: DC
  Administered 2012-07-31: 1.5 mg via TRANSDERMAL
  Filled 2012-07-31: qty 1

## 2012-07-31 MED ORDER — BUPIVACAINE-EPINEPHRINE 0.25% -1:200000 IJ SOLN
INTRAMUSCULAR | Status: DC | PRN
  Administered 2012-07-31: 60 mL

## 2012-07-31 MED ORDER — ONDANSETRON HCL 4 MG/2ML IJ SOLN
4.0000 mg | Freq: Four times a day (QID) | INTRAMUSCULAR | Status: DC | PRN
  Administered 2012-08-02: 4 mg via INTRAVENOUS
  Filled 2012-07-31: qty 2

## 2012-07-31 MED ORDER — SODIUM CHLORIDE 0.9 % IV SOLN: 1.0000 ug/kg/h | INTRAVENOUS | Status: DC | PRN

## 2012-07-31 SURGICAL SUPPLY — 65 items
BAG SPEC THK2 15X12 ZIP CLS (MISCELLANEOUS) ×1
BAG ZIPLOCK 12X15 (MISCELLANEOUS) ×3 IMPLANT
BANDAGE ELASTIC 4 VELCRO ST LF (GAUZE/BANDAGES/DRESSINGS) ×2 IMPLANT
BANDAGE ELASTIC 6 VELCRO ST LF (GAUZE/BANDAGES/DRESSINGS) ×2 IMPLANT
BANDAGE ESMARK 6X9 LF (GAUZE/BANDAGES/DRESSINGS) ×1 IMPLANT
BANDAGE GAUZE ELAST BULKY 4 IN (GAUZE/BANDAGES/DRESSINGS) ×3 IMPLANT
BLADE SAG 18X100X1.27 (BLADE) ×2 IMPLANT
BLADE SAW SGTL 13.0X1.19X90.0M (BLADE) ×2 IMPLANT
BNDG CMPR 9X6 STRL LF SNTH (GAUZE/BANDAGES/DRESSINGS) ×1
BNDG ESMARK 6X9 LF (GAUZE/BANDAGES/DRESSINGS) ×2
CEMENT HV SMART SET (Cement) ×4 IMPLANT
CLOTH BEACON ORANGE TIMEOUT ST (SAFETY) ×2 IMPLANT
CLSR STERI-STRIP ANTIMIC 1/2X4 (GAUZE/BANDAGES/DRESSINGS) ×1 IMPLANT
CUFF TOURN SGL QUICK 34 (TOURNIQUET CUFF) ×2
CUFF TRNQT CYL 34X4X40X1 (TOURNIQUET CUFF) ×1 IMPLANT
DRAPE EXTREMITY T 121X128X90 (DRAPE) ×2 IMPLANT
DRAPE LG THREE QUARTER DISP (DRAPES) ×1 IMPLANT
DRAPE POUCH INSTRU U-SHP 10X18 (DRAPES) ×2 IMPLANT
DRAPE U-SHAPE 47X51 STRL (DRAPES) ×2 IMPLANT
DRSG PAD ABDOMINAL 8X10 ST (GAUZE/BANDAGES/DRESSINGS) ×3 IMPLANT
DURAPREP 26ML APPLICATOR (WOUND CARE) ×2 IMPLANT
ELECT REM PT RETURN 9FT ADLT (ELECTROSURGICAL) ×2
ELECTRODE REM PT RTRN 9FT ADLT (ELECTROSURGICAL) ×1 IMPLANT
EVACUATOR 1/8 PVC DRAIN (DRAIN) ×2 IMPLANT
FACESHIELD LNG OPTICON STERILE (SAFETY) ×10 IMPLANT
GAUZE XEROFORM 2X2 STRL (GAUZE/BANDAGES/DRESSINGS) ×2 IMPLANT
GLOVE ECLIPSE 8.0 STRL XLNG CF (GLOVE) ×2 IMPLANT
GLOVE SURG ORTHO 8.0 STRL STRW (GLOVE) ×2 IMPLANT
GLOVE SURG ORTHO 9.0 STRL STRW (GLOVE) ×2 IMPLANT
GOWN PREVENTION PLUS XLARGE (GOWN DISPOSABLE) ×4 IMPLANT
GOWN STRL NON-REIN LRG LVL3 (GOWN DISPOSABLE) ×2 IMPLANT
GOWN STRL REIN XL XLG (GOWN DISPOSABLE) ×3 IMPLANT
HANDPIECE INTERPULSE COAX TIP (DISPOSABLE) ×2
IMMOBILIZER KNEE 20 (SOFTGOODS) ×2
IMMOBILIZER KNEE 20 THIGH 36 (SOFTGOODS) IMPLANT
KIT BASIN OR (CUSTOM PROCEDURE TRAY) ×2 IMPLANT
NDL SAFETY ECLIPSE 18X1.5 (NEEDLE) IMPLANT
NEEDLE HYPO 18GX1.5 SHARP (NEEDLE) ×2
NS IRRIG 1000ML POUR BTL (IV SOLUTION) ×2 IMPLANT
PACK TOTAL JOINT (CUSTOM PROCEDURE TRAY) ×2 IMPLANT
POSITIONER SURGICAL ARM (MISCELLANEOUS) ×2 IMPLANT
SET HNDPC FAN SPRY TIP SCT (DISPOSABLE) ×1 IMPLANT
SET PAD KNEE POSITIONER (MISCELLANEOUS) ×2 IMPLANT
SPONGE GAUZE 4X4 12PLY (GAUZE/BANDAGES/DRESSINGS) ×2 IMPLANT
SPONGE LAP 18X18 X RAY DECT (DISPOSABLE) ×1 IMPLANT
SPONGE SURGIFOAM ABS GEL 100 (HEMOSTASIS) ×2 IMPLANT
STOCKINETTE 6  STRL (DRAPES) ×1
STOCKINETTE 6 STRL (DRAPES) ×1 IMPLANT
STRIP CLOSURE SKIN 1/2X4 (GAUZE/BANDAGES/DRESSINGS) ×4 IMPLANT
SUCTION FRAZIER 12FR DISP (SUCTIONS) ×2 IMPLANT
SUT BONE WAX W31G (SUTURE) ×2 IMPLANT
SUT MNCRL AB 3-0 PS2 18 (SUTURE) ×2 IMPLANT
SUT VIC AB 0 CT1 27 (SUTURE)
SUT VIC AB 0 CT1 27XBRD ANTBC (SUTURE) ×2 IMPLANT
SUT VIC AB 1 CT1 27 (SUTURE) ×14
SUT VIC AB 1 CT1 27XBRD ANTBC (SUTURE) ×7 IMPLANT
SUT VIC AB 2-0 CT1 27 (SUTURE) ×4
SUT VIC AB 2-0 CT1 TAPERPNT 27 (SUTURE) ×2 IMPLANT
SYR 50ML LL SCALE MARK (SYRINGE) ×1 IMPLANT
TAPE STRIPS DRAPE STRL (GAUZE/BANDAGES/DRESSINGS) ×2 IMPLANT
TOWEL OR 17X26 10 PK STRL BLUE (TOWEL DISPOSABLE) ×5 IMPLANT
TOWER CARTRIDGE SMART MIX (DISPOSABLE) ×2 IMPLANT
TRAY FOLEY CATH 14FRSI W/METER (CATHETERS) ×2 IMPLANT
WATER STERILE IRR 1500ML POUR (IV SOLUTION) ×2 IMPLANT
WRAP KNEE MAXI GEL POST OP (GAUZE/BANDAGES/DRESSINGS) ×3 IMPLANT

## 2012-07-31 NOTE — Anesthesia Postprocedure Evaluation (Signed)
  Anesthesia Post-op Note  Patient: [name]  Procedure(s) Performed: Procedure(s) (LRB): TOTAL KNEE ARTHROPLASTY (Left)  Patient Location: PACU  Anesthesia Type: Spinal  Level of Consciousness: awake and alert   Airway and Oxygen Therapy: Patient Spontanous Breathing  Post-op Pain: mild  Post-op Assessment: Post-op Vital signs reviewed, Patient's Cardiovascular Status Stable, Respiratory Function Stable, Patent Airway and No signs of Nausea or vomiting  Post-op Vital Signs: stable  Complications: No apparent anesthesia complications

## 2012-07-31 NOTE — H&P (Signed)
[name], [name]                ACCOUNT NO.:  [id number]  MEDICAL RECORD NO.:  [id number]  LOCATION:                               FACILITY:  [facility]  PHYSICIAN:  Erasmo Leventhal, M.D.DATE OF BIRTH:  09-08-1951  DATE OF ADMISSION:  07/31/2012 DATE OF DISCHARGE:                             HISTORY & PHYSICAL   ADMISSION DIAGNOSES: 1. End-stage osteoarthritis, left knee, failed conservative treatment. 2. History of Barrett esophagitis. 3. History of hemorrhoids.  HISTORY OF PRESENT ILLNESS:  The patient is a 61 year old male well known to Dr. Thomasena Edis for evaluation of his left knee.  The patient has had treatments to include arthroscopies of the knee and injections.  He has failed conservative treatments.  He continued to have pain and difficulty with ambulation and with activities of daily living.  The patient and the doctor discussed further treatments to include total knee arthroplasty.  The patient was given pros and cons, possible complications and he elected to proceed.  His x-rays in the office, review of weightbearing films, he has bone-on-bone medial compartment, he has lateral degenerative compartment changes.  He has got varus deformity.  He has got significant patellofemoral degenerative changes.  ALLERGIES:  PENICILLIN.  Has not had it since child.  He did have seizures with penicillin in the past.  PRIMARY CARE PHYSICIAN:  Georgina Quint. Plotnikov, MD  CARDIOLOGIST:  Noralyn Pick. Eden Emms, MD, [facility]  MEDICATIONS: 1. Metoclopramide 110 mg b.i.d. 2. Pantoprazole 40 mg b.i.d. 3. Tramadol 50 mg 2 tablets b.i.d. 4. Celebrex 200 mg once a day. 5. Valacyclovir 500 mg once a day. 6. Nasonex once a day. 7. Multiple vitamin supplements over the counter.  PAST MEDICAL HISTORY:  Includes positive for: 1. Barrett esophagitis. 2. Hemorrhoids. 3. Advanced osteoarthritis, left knee.  REVIEW OF SYSTEMS:  Negative for any strokes, convulsions, numbness or tingling.  He did  have seizures with penicillin as a child.  He does wear glasses.  He does have upper bridge partial.  PULMONARY:  He denies any problems related to any asthma, bronchitis, pneumonia, COPD, emphysema, or history of tuberculosis.  He did recently have a shortness of breath episode.  He was evaluated in the emergency room.  No cardiac or respiratory etiology noted.  The primary care physician felt possibly an allergic reactions or something.  CARDIOVASCULAR:  He denies any chest pain, chest pressure, shortness of breath with activities, no sense of irregular heart rhythm, beating too fast, too slow.  He has recently had a significant cardiac workup for preoperative evaluation through Dr. Fabio Bering office, which is unremarkable.  GI:  He denies any unusual constipation, diarrhea, nausea or vomiting.  No history of significant ulcers.  He does have a history of Barrett esophagitis.  He does have hemorrhoids.  He denies any jaundice, abdominal discomfort, hepatitis, gallbladder issues, diverticular problems.  GU:  He denies any problems with urination.  No significant problems with nocturia, pain on urination, burning on urination.  No history of kidney stones. ENDOCRINE:  He denies any temperature intolerance.  No unusual thirst or urination.  He does report borderline diabetes about 110s, but he is controlling it with diet.  No medications.  HEMATOLOGIC:  He denies any problems with anemia in the past.  No blood transfusions, no blood clots, no cancers.  PAST SURGICAL HISTORY:  Includes left knee scope and umbilical hernia without any complications from anesthesia.  FAMILY MEDICAL HISTORY:  His dad is deceased in his mid 33s from a history of a stroke and had a history of MIs.  Mother is alive at the age of 24 with Alzheimer.  SOCIAL HISTORY:  The patient is married, lives with his wife.  He does gets smoke on occasional cigar, very rare alcohol, no street drugs.  He lives with his family,  will care from him, postop.  PHYSICAL EXAMINATION:  VITAL SIGNS:  Height is 5 feet and 11 inches, weight is 215 pounds.  Blood pressure is 118/72, pulse of 80 and regular, respirations are about 14 and nonlabored. GENERAL:  This is a healthy-appearing, well-developed gentleman who walks with a slight left-sided limp. HEENT.  Head is normocephalic.  Pupils are equal, round, and reactive. Gross hearing is intact. NECK:  Supple.  No palpable lymphadenopathy.  Good range of motion. CHEST:  Lung sounds were clear throughout. HEART:  Regular rate and rhythm. ABDOMEN:  Soft.  Bowel sounds present. EXTREMITIES:  Upper extremities, he had good range of motion and good motor strength in both upper extremities.  Lower extremities, he had full range of motion in his hip, knee and ankle without any difficulty. His left knee, he has slight varus deformity.  He was able to extend his knee to about 5 degrees short of full extension, flexion back to about 115.  He did have crepitus in knee with motion.  His calf was soft, nontender.  Good motion of the ankle and the hip without any difficulty. Peripheral vascular; carotid pulses were 2+.  No bruits.  Radial pulses were 2+.  Posterior tibial pulses, he had no lower extremity edema or venous stasis changes. BREAST:  Deferred. RECTAL:  Deferred. GU:  Deferred.  IMPRESSION: 1. End-stage osteoarthritis, left knee. 2. History of Barrett esophagitis. 3. History of hemorrhoids.  PLAN:  The patient will undergo all routine labs and tests prior to having a left total knee arthroplasty by Dr. Thomasena Edis at [facility] on July 31, 2012.  The patient has been evaluated and cleared by Dr. Posey Rea and then once again by Dr. Eden Emms, post shortness of breath event in the emergency room.     Jamelle Rushing, P.A.   ______________________________ Erasmo Leventhal, M.D.    RWK/MEDQ  D:  07/25/2012  T:  07/26/2012  Job:  [id number]

## 2012-07-31 NOTE — Anesthesia Preprocedure Evaluation (Addendum)
Anesthesia Evaluation  Patient identified by MRN, date of birth, ID band Patient awake    Reviewed: Allergy & Precautions, H&P , NPO status , Patient's Chart, lab work & pertinent test results  Airway Mallampati: II TM Distance: <3 FB Neck ROM: Full    Dental No notable dental hx.    Pulmonary neg pulmonary ROS,    Pulmonary exam normal + decreased breath sounds      Cardiovascular negative cardio ROS  Rhythm:Regular Rate:Normal     Neuro/Psych negative neurological ROS  negative psych ROS   GI/Hepatic Neg liver ROS, GERD-  Medicated,  Endo/Other  negative endocrine ROS  Renal/GU negative Renal ROS  negative genitourinary   Musculoskeletal negative musculoskeletal ROS (+)   Abdominal   Peds negative pediatric ROS (+)  Hematology negative hematology ROS (+)   Anesthesia Other Findings   Reproductive/Obstetrics negative OB ROS                           Anesthesia Physical Anesthesia Plan  ASA: II  Anesthesia Plan: Spinal   Post-op Pain Management:    Induction:   Airway Management Planned: Simple Face Mask  Additional Equipment:   Intra-op Plan:   Post-operative Plan:   Informed Consent: I have reviewed the patients History and Physical, chart, labs and discussed the procedure including the risks, benefits and alternatives for the proposed anesthesia with the patient or authorized representative who has indicated his/her understanding and acceptance.     Plan Discussed with: CRNA and Surgeon  Anesthesia Plan Comments:         Anesthesia Quick Evaluation

## 2012-07-31 NOTE — Preoperative (Signed)
Beta Blockers   Reason not to administer Beta Blockers:Not Applicable 

## 2012-07-31 NOTE — Op Note (Signed)
DATE OF SURGERY:  07/31/2012  TIME: 6:24 PM  PATIENT NAME:  [name]    AGE: 61 y.o.   PRE-OPERATIVE DIAGNOSIS:  left knee osteoarthritis  POST-OPERATIVE DIAGNOSIS:  left knee osteoarthritis  PROCEDURE:  Procedure(s): TOTAL KNEE ARTHROPLASTY  SURGEON:  Christi Wirick ANDREW  ASSISTANT:  Oneida Alar, PA-C, present and scrubbed throughout the case, critical for assistance with exposure, retraction, instrumentation, and closure.  OPERATIVE IMPLANTS: Depuy PFC Sigma Rotating Platform.  Femur size 4, Tibia size 5, Patella size 41 3-peg oval button, with a 10 mm polyethylene insert.   PREOPERATIVE INDICATIONS:   [name] is a 61 y.o. year old male with end stage bone on bone arthritis of the knee who failed conservative treatment and elected for Total Knee Arthroplasty.   The risks, benefits, and alternatives were discussed at length including but not limited to the risks of infection, bleeding, nerve injury, stiffness, blood clots, the need for revision surgery, cardiopulmonary complications, among others, and they were willing to proceed.  OPERATIVE DESCRIPTION:  The patient was brought to the operative room and placed in a supine position.  Spinal anesthesia was administered.  IV antibiotics were given.  The lower extremity was prepped and draped in the usual sterile fashion.  Time out was performed.  The leg was elevated and exsanguinated and the tourniquet was inflated.  Anterior quadriceps tendon splitting approach was performed.  The patella was retracted and osteophytes were removed.  The anterior horn of the medial and lateral meniscus was removed and cruciate ligaments resected.   The distal femur was opened with the drill and the intramedullary distal femoral cutting jig was utilized, set at 5 degrees resecting 10 mm off the distal femur.  Care was taken to protect the collateral ligaments.  The distal femoral sizing jig was applied, taking care to avoid notching.   Then the 4-in-1 cutting jig was applied and the anterior and posterior femur was cut, along with the chamfer cuts.    Then the extramedullary tibial cutting jig was utilized making the appropriate cut using the anterior tibial crest as a reference building in appropriate posterior slope.  Care was taken during the cut to protect the medial and collateral ligaments.  The proximal tibia was removed along with the posterior horns of the menisci.   The posterior medial femoral osteophytes and posterior lateral femoral osteophytes were removed.    The flexion gap was then measured and was symmetric with the extension gap, measured at 10.  I completed the distal femoral preparation using the appropriate jig to prepare the box.  The patella was then measured, and cut with the saw.    The proximal tibia sized and prepared accordingly with the reamer and the punch, and then all components were trialed with the trial insert.  The knee was found to have excellent balance and full motion.    The above named components were then cemented into place and all excess cement was removed.  The trial polyethylene component was in place during cementation, and then was exchanged for the real polyethylene component.    The knee was easily taken through a range of motion and the patella tracked well and the knee irrigated copiously and the parapatellar and subcutaneous tissue closed with vicryl, and monocryl with steri strips for the skin.  The arthrotomy was closed at 90 of flexion. The wounds were dressed with sterile gauze and the tourniquet released and the patient was awakened and returned to the PACU in  stable and satisfactory condition.  There were no complications.  Total tourniquet time was95   minutes.  Before closure 50 cc of .25% marcaine and 30 mg toradol was infiltrated into the periosteum.

## 2012-07-31 NOTE — Anesthesia Procedure Notes (Signed)

## 2012-07-31 NOTE — Transfer of Care (Signed)
Immediate Anesthesia Transfer of Care Note  Patient: [name]  Procedure(s) Performed: Procedure(s) (LRB) with comments: TOTAL KNEE ARTHROPLASTY (Left)  Patient Location: PACU  Anesthesia Type: MAC and Spinal  Level of Consciousness: awake, alert , oriented and patient cooperative  Airway & Oxygen Therapy: Patient Spontanous Breathing and Patient connected to face mask oxygen  Post-op Assessment: Report given to PACU RN and Post -op Vital signs reviewed and stable  Post vital signs: Reviewed and stable  Complications: No apparent anesthesia complications

## 2012-08-01 LAB — BASIC METABOLIC PANEL
BUN: 9 mg/dL (ref 6–23)
Calcium: 8.4 mg/dL (ref 8.4–10.5)
Chloride: 104 mEq/L (ref 96–112)
Creatinine, Ser: 0.73 mg/dL (ref 0.50–1.35)
GFR calc Af Amer: >90 mL/min (ref >90)

## 2012-08-01 LAB — CBC
HCT: 33.2 % — ABNORMAL LOW (ref 39.0–52.0)
MCH: 31.4 pg (ref 26.0–34.0)
MCHC: 34.3 g/dL (ref 30.0–36.0)
MCV: 91.5 fL (ref 78.0–100.0)
Platelets: 197 10*3/uL (ref 150–400)
RDW: 12.7 % (ref 11.5–15.5)
WBC: 11.6 10*3/uL — ABNORMAL HIGH (ref 4.0–10.5)

## 2012-08-01 NOTE — Progress Notes (Signed)
Utilization review completed.  

## 2012-08-01 NOTE — Progress Notes (Signed)
Subjective: Patient reports an uneventful mind no significant problems with pain nausea or vomiting shortness of breath he feels his pain   Objective: Vital signs in last 24 hours: Temp:  [97 F (36.1 C)-98 F (36.7 C)] 98 F (36.7 C) (09/18 0644) Pulse Rate:  [61-78] 65  (09/18 0644) Resp:  [12-20] 14  (09/18 0644) BP: (108-132)/(67-84) 110/67 mmHg (09/18 0644) SpO2:  [98 %-100 %] 98 % (09/18 0644) Weight:  [98 kg (216 lb 0.8 oz)] 98 kg (216 lb 0.8 oz) (09/17 1956)  Intake/Output from previous day: 09/17 0701 - 09/18 0700 In: 3171.3 [I.V.:2971.3; IV Piggyback:200] Out: 2155 [Urine:1780; Drains:365; Blood:10] Intake/Output this shift:     Basename 08/01/12 0405  HGB 11.4*    Basename 08/01/12 0405  WBC 11.6*  RBC 3.63*  HCT 33.2*  PLT 197    Basename 08/01/12 0405  NA 139  K 3.7  CL 104  CO2 25  BUN 9  CREATININE 0.73  GLUCOSE 111*  CALCIUM 8.4   No results found for this basename: LABPT:2,INR:2 in the last 72 hours  Patient is conscious alert appropriate sitting up in his hospital bed appears to be in no distress. Lungs are clear and equal throughout heart is regular rate and rhythm abdomen is soft bowel sounds present his left lower extremity dressing is intact he has no calf pain his foot is neurologically intact in the dorsalis pedis pulse. His Hemovac drain was DC'd intact his Foley catheter was already DC'd this morning  Assessment/Plan: Postop day #1 status post left total knee arthroplasty doing very well GERD stable Coronary artery disease stable no chest pain shortness of breath   Plan we will start his total knee arthroplasty protocol today with out of bed with physical therapy weightbearing as tolerated and initiation of the CPM today. His labs today were within normal limits. Will cut back down on IV fluids. Recheck labs in the morning change dressing in the morning haven't progressed with therapy possible discharge to home on Friday   [name]  W 08/01/2012, 7:19 AM

## 2012-08-01 NOTE — Evaluation (Signed)
Physical Therapy Evaluation Patient Details Name: [name] MRN: [id number] DOB: 01-27-1951 Today's Date: 08/01/2012 Time: 1324-4010 PT Time Calculation (min): 40 min  PT Assessment / Plan / Recommendation Clinical Impression  Pt s/p L TKR presents with decreased L LE strength/ROM limiting functional mobility    PT Assessment  Patient needs continued PT services    Follow Up Recommendations  Home health PT    Barriers to Discharge        Equipment Recommendations       Recommendations for Other Services     Frequency 7X/week    Precautions / Restrictions Precautions Precautions: Knee Required Braces or Orthoses: Knee Immobilizer - Left Knee Immobilizer - Left: Discontinue once straight leg raise with < 10 degree lag (Pt performed IND SLR this am ) Restrictions Weight Bearing Restrictions: No Other Position/Activity Restrictions: WBAT   Pertinent Vitals/Pain 2/10; premedicated, ice packs provided      Mobility  Bed Mobility Bed Mobility: Supine to Sit Supine to Sit: 4: Min assist Details for Bed Mobility Assistance: min cues for sequence and use of R LE to self assist Transfers Transfers: Sit to Stand;Stand to Sit Sit to Stand: 4: Min guard Stand to Sit: 4: Min guard Details for Transfer Assistance: min VCs for hand placment and LLE management. Ambulation/Gait Ambulation/Gait Assistance: 4: Min assist;4: Min Government social research officer (Feet): 135 Feet Assistive device: Rolling walker Ambulation/Gait Assistance Details: cues for sequence, stride length, posture and position from RW Gait Pattern: Step-to pattern    Exercises Total Joint Exercises Ankle Circles/Pumps: AROM;Both;10 reps;Supine Quad Sets: AROM;10 reps;Both;Supine Heel Slides: AAROM;10 reps;Supine;Left Hip ABduction/ADduction: AAROM;10 reps;Supine;Left   PT Diagnosis: Difficulty walking  PT Problem List: Decreased strength;Decreased range of motion;Decreased activity tolerance;Decreased  mobility;Pain;Decreased knowledge of use of DME PT Treatment Interventions: DME instruction;Gait training;Stair training;Functional mobility training;Therapeutic activities;Therapeutic exercise;Patient/family education   PT Goals Acute Rehab PT Goals PT Goal Formulation: With patient Time For Goal Achievement: 08/06/12 Potential to Achieve Goals: Good Pt will go Supine/Side to Sit: with supervision PT Goal: Supine/Side to Sit - Progress: Goal set today Pt will go Sit to Supine/Side: with supervision PT Goal: Sit to Supine/Side - Progress: Goal set today Pt will go Sit to Stand: with supervision PT Goal: Sit to Stand - Progress: Goal set today Pt will go Stand to Sit: with supervision PT Goal: Stand to Sit - Progress: Goal set today Pt will Ambulate: >150 feet;with supervision PT Goal: Ambulate - Progress: Goal set today Pt will Go Up / Down Stairs: 1-2 stairs;with min assist;with least restrictive assistive device PT Goal: Up/Down Stairs - Progress: Goal set today  Visit Information  Last PT Received On: 08/01/12 Assistance Needed: +1    Subjective Data  Subjective: I need to get back to work - I have not ST disability Patient Stated Goal: Resume previous lifestyle with decreased pain   Prior Functioning  Home Living Lives With: Spouse Available Help at Discharge: Family Type of Home: House Home Access: Stairs to enter Secretary/administrator of Steps: 1+1 Entrance Stairs-Rails: None Home Layout: One level;Able to live on main level with bedroom/bathroom Bathroom Shower/Tub: Engineer, manufacturing systems: Standard Home Adaptive Equipment: Environmental consultant - standard Prior Function Level of Independence: Independent Able to Take Stairs?: Yes Driving: Yes Vocation: Full time employment Communication Communication: No difficulties Dominant Hand: Right    Cognition  Overall Cognitive Status: Appears within functional limits for tasks assessed/performed Arousal/Alertness:  Awake/alert Orientation Level: Appears intact for tasks assessed Behavior During Session: [facility] for  tasks performed    Extremity/Trunk Assessment Right Upper Extremity Assessment RUE ROM/Strength/Tone: [facility] for tasks assessed Left Upper Extremity Assessment LUE ROM/Strength/Tone: WFL for tasks assessed Right Lower Extremity Assessment RLE ROM/Strength/Tone: [facility] for tasks assessed Left Lower Extremity Assessment LLE ROM/Strength/Tone: Deficits LLE ROM/Strength/Tone Deficits: 3/5 Quad strength; AAROM at knee -10 - 85   Balance    End of Session PT - End of Session Activity Tolerance: Patient tolerated treatment well Patient left: in chair;with call bell/phone within reach Nurse Communication: Mobility status  GP     [name] 08/01/2012, 2:38 PM

## 2012-08-01 NOTE — Progress Notes (Signed)
Physical Therapy Treatment Patient Details Name: [name] MRN: [id number] DOB: [birth year] Today's Date: 08/01/2012 Time: 4098-1191 PT Time Calculation (min): 14 min  PT Assessment / Plan / Recommendation Comments on Treatment Session       Follow Up Recommendations  Home health PT    Barriers to Discharge        Equipment Recommendations  Rolling walker with 5" wheels    Recommendations for Other Services    Frequency 7X/week   Plan Discharge plan remains appropriate    Precautions / Restrictions Precautions Precautions: Knee Required Braces or Orthoses: Knee Immobilizer - Left Knee Immobilizer - Left: Discontinue once straight leg raise with < 10 degree lag Restrictions Weight Bearing Restrictions: No Other Position/Activity Restrictions: WBAT   Pertinent Vitals/Pain 3/10; Pain meds requested in preparation for CPM    Mobility  Bed Mobility Bed Mobility: Sit to Supine Supine to Sit: 4: Min assist Sit to Supine: 4: Min guard Details for Bed Mobility Assistance: min cues for sequence and use of R LE to self assist Transfers Transfers: Sit to Stand;Stand to Sit Sit to Stand: 4: Min guard Stand to Sit: 5: Supervision Details for Transfer Assistance: min VCs for hand placment and LLE management. Ambulation/Gait Ambulation/Gait Assistance: 4: Min guard;5: Supervision Ambulation Distance (Feet): 200 Feet Assistive device: Rolling walker Ambulation/Gait Assistance Details: cues for sequence, stride length, posture and position from RW Gait Pattern: Step-to pattern;Step-through pattern    Exercises Total Joint Exercises Ankle Circles/Pumps: AROM;Both;10 reps;Supine Quad Sets: AROM;10 reps;Both;Supine Heel Slides: AAROM;10 reps;Supine;Left Hip ABduction/ADduction: AAROM;10 reps;Supine;Left   PT Diagnosis: Difficulty walking  PT Problem List: Decreased strength;Decreased range of motion;Decreased activity tolerance;Decreased mobility;Pain;Decreased knowledge of  use of DME PT Treatment Interventions: DME instruction;Gait training;Stair training;Functional mobility training;Therapeutic activities;Therapeutic exercise;Patient/family education   PT Goals Acute Rehab PT Goals PT Goal Formulation: With patient Time For Goal Achievement: 08/06/12 Potential to Achieve Goals: Good Pt will go Supine/Side to Sit: with supervision PT Goal: Supine/Side to Sit - Progress: Goal set today Pt will go Sit to Supine/Side: with supervision PT Goal: Sit to Supine/Side - Progress: Goal set today Pt will go Sit to Stand: with supervision PT Goal: Sit to Stand - Progress: Progressing toward goal Pt will go Stand to Sit: with supervision PT Goal: Stand to Sit - Progress: Progressing toward goal Pt will Ambulate: >150 feet;with supervision PT Goal: Ambulate - Progress: Progressing toward goal Pt will Go Up / Down Stairs: 1-2 stairs;with min assist;with least restrictive assistive device PT Goal: Up/Down Stairs - Progress: Goal set today  Visit Information  Last PT Received On: 08/01/12 Assistance Needed: +1    Subjective Data  Subjective: I need to get back to work - I have not ST disability Patient Stated Goal: Resume previous lifestyle with decreased pain   Cognition  Overall Cognitive Status: Appears within functional limits for tasks assessed/performed Arousal/Alertness: Awake/alert Orientation Level: Appears intact for tasks assessed Behavior During Session: [facility] for tasks performed    Balance     End of Session PT - End of Session Activity Tolerance: Patient tolerated treatment well Patient left: in bed;with call bell/phone within reach Nurse Communication: Mobility status   GP     [name] 08/01/2012, 2:43 PM

## 2012-08-01 NOTE — Evaluation (Signed)
Occupational Therapy Evaluation & Discharge Patient Details Name: [name] MRN: [id number] DOB: 05/31/1951 Today's Date: 08/01/2012 Time: 7829-5621 OT Time Calculation (min): 18 min  OT Assessment / Plan / Recommendation Clinical Impression  Pt doing well POD 1 LTKR. All education completed. Pt will have necessary level of A from spouse upon d/c.    OT Assessment  Patient does not need any further OT services    Follow Up Recommendations  No OT follow up    Barriers to Discharge      Equipment Recommendations  Rolling walker with 5" wheels    Recommendations for Other Services    Frequency       Precautions / Restrictions Precautions Precautions: Knee Required Braces or Orthoses: Knee Immobilizer - Left Knee Immobilizer - Left: Other (comment);Discontinue once straight leg raise with < 10 degree lag (Pt already able to do SLR) Restrictions Weight Bearing Restrictions: No   Pertinent Vitals/Pain Pt reported 3/10 pain post session. Repositioned and cold applied.    ADL  Grooming: Performed;Teeth care;Wash/dry hands;Supervision/safety Where Assessed - Grooming: Supported standing Lower Body Bathing: Simulated;Set up Where Assessed - Lower Body Bathing: Supported sit to stand Lower Body Dressing: Simulated;Set up Where Assessed - Lower Body Dressing: Supported sit to stand Toilet Transfer: Performed;Min Pension scheme manager Method: Sit to Barista: Regular height toilet Toileting - Clothing Manipulation and Hygiene: Performed;Supervision/safety Where Assessed - Engineer, mining and Hygiene: Standing Equipment Used: Rolling walker Transfers/Ambulation Related to ADLs: Pt ambulated to the bathroom with supervision and was able to safely complete sit<>stand from standard toilet. ADL Comments: Discussed with pt how to safely step into tub, correct method for putting on LB garments with good return demo.    OT Diagnosis:    OT  Problem List:   OT Treatment Interventions:     OT Goals    Visit Information  Last OT Received On: 08/01/12 Assistance Needed: +1    Subjective Data  Subjective: I hope I can go home by Friday. Patient Stated Goal: Go home and play with my dogs.   Prior Functioning  Vision/Perception  Home Living Lives With: Spouse Available Help at Discharge: Family Type of Home: House Home Access: Stairs to enter Secretary/administrator of Steps: 1+1 Entrance Stairs-Rails: None Home Layout: One level;Able to live on main level with bedroom/bathroom Bathroom Shower/Tub: Engineer, manufacturing systems: Standard Home Adaptive Equipment: Shower chair with back;Walker - standard Prior Function Level of Independence: Independent Able to Take Stairs?: Yes Driving: Yes Vocation: Full time employment Communication Communication: No difficulties Dominant Hand: Right      Cognition  Overall Cognitive Status: Appears within functional limits for tasks assessed/performed Arousal/Alertness: Awake/alert Orientation Level: Appears intact for tasks assessed Behavior During Session: [facility] for tasks performed    Extremity/Trunk Assessment Right Upper Extremity Assessment RUE ROM/Strength/Tone: [facility] for tasks assessed Left Upper Extremity Assessment LUE ROM/Strength/Tone: WFL for tasks assessed   Mobility  Shoulder Instructions  Transfers Transfers: Sit to Stand;Stand to Sit Sit to Stand: 4: Min guard;From toilet;From chair/3-in-1;With upper extremity assist Stand to Sit: 5: Supervision;With upper extremity assist;With armrests;To chair/3-in-1;To toilet Details for Transfer Assistance: min VCs for hand placment and LLE management.       Exercise     Balance     End of Session OT - End of Session Activity Tolerance: Patient tolerated treatment well Patient left: in chair;with call bell/phone within reach  GO     [name] A OTR/L [phone] 08/01/2012, 10:25 AM

## 2012-08-01 NOTE — Progress Notes (Signed)
CSW consulted for SNF placement. PN reviewed. PT is recommending HHPT following hospital d/c.RNCM will assist with d/c planning to home. CSW is available to assist with d/c planning if plan changes and SNF is needed.  Cori Razor LCSW [phone]

## 2012-08-02 ENCOUNTER — Encounter (HOSPITAL_COMMUNITY): Payer: Self-pay | Admitting: Specialist

## 2012-08-02 LAB — CBC
HCT: 31.7 % — ABNORMAL LOW (ref 39.0–52.0)
MCHC: 34.7 g/dL (ref 30.0–36.0)
Platelets: 208 10*3/uL (ref 150–400)
RDW: 12.7 % (ref 11.5–15.5)
WBC: 11.2 10*3/uL — ABNORMAL HIGH (ref 4.0–10.5)

## 2012-08-02 NOTE — Progress Notes (Signed)
Subjective: Patient doing well he denies any shortness of breath pain pain medicines were working well no nausea vomiting with food tolerated physical therapy fairly well yesterday tolerated the CPM well   Objective: Vital signs in last 24 hours: Temp:  [98 F (36.7 C)-98.6 F (37 C)] 98.6 F (37 C) (09/19 0500) Pulse Rate:  [65-90] 90  (09/19 0500) Resp:  [14-16] 16  (09/19 0500) BP: (105-147)/(66-71) 122/67 mmHg (09/19 0500) SpO2:  [93 %-98 %] 93 % (09/19 0500)  Intake/Output from previous day: 09/18 0701 - 09/19 0700 In: 720 [P.O.:720] Out: 600 [Urine:600] Intake/Output this shift:     Basename 08/02/12 0424 08/01/12 0405  HGB 11.0* 11.4*    Basename 08/02/12 0424 08/01/12 0405  WBC 11.2* 11.6*  RBC 3.47* 3.63*  HCT 31.7* 33.2*  PLT 208 197    Basename 08/01/12 0405  NA 139  K 3.7  CL 104  CO2 25  BUN 9  CREATININE 0.73  GLUCOSE 111*  CALCIUM 8.4   No results found for this basename: LABPT:2,INR:2 in the last 72 hours  patient's conscious alert and appropriate appears to be in no distress the swelling is sitting position without any difficulty. His left lower extremity dressing is intact dressing was taken down wound is well approximated with Steri-Strips no signs of infection no pressure blisters no ecchymosis no drainage from the Hemovac site his calf and thigh are soft nontender his leg is neuromotor vascularly intact nucleated dressing was applied  Assessment/Plan: Postop day #2 status post left total knee arthroplasty stable doing well Labs hemoglobin is stable without any progressive significant blood loss vital signs stable patient tolerating it well GERD stable History of coronary artery disease stable without any shortness breath chest pains tolerating physical therapy well   Plan patient will be out of bed today with physical therapy continue with CPM per protocol will DC IV fluids. One is for discharge tomorrow morning to home with home health  physical therapy patient has a walker at home.   Jamelle Rushing 08/02/2012, 6:35 AM

## 2012-08-02 NOTE — Progress Notes (Signed)
Physical Therapy Treatment Patient Details Name: [name] MRN: [id number] DOB: [birth year] Today's Date: 08/02/2012 Time: 4098-1191 PT Time Calculation (min): 23 min  PT Assessment / Plan / Recommendation Comments on Treatment Session  Pt continues motivated but ltd this am by nausea and pain    Follow Up Recommendations  Home health PT    Barriers to Discharge        Equipment Recommendations  Rolling walker with 5" wheels    Recommendations for Other Services    Frequency 7X/week   Plan Discharge plan remains appropriate    Precautions / Restrictions Precautions Precautions: Knee Required Braces or Orthoses: Knee Immobilizer - Left Knee Immobilizer - Left: Discontinue once straight leg raise with < 10 degree lag (Pt unable to IND SLR this pm) Restrictions Weight Bearing Restrictions: No Other Position/Activity Restrictions: WBAT   Pertinent Vitals/Pain 8/10; premedicated, RN aware, ice packs provided    Mobility  Bed Mobility Bed Mobility: Supine to Sit Supine to Sit: 4: Min guard Details for Bed Mobility Assistance: pt self assisting L LE with UEs Transfers Transfers: Sit to Stand;Stand to Sit Sit to Stand: 5: Supervision Stand to Sit: 5: Supervision Details for Transfer Assistance: min cues for use of UEs Ambulation/Gait Ambulation/Gait Assistance: 4: Min guard;5: Supervision Ambulation Distance (Feet): 121 Feet Assistive device: Rolling walker Ambulation/Gait Assistance Details: min cues for posture and position from RW Gait Pattern: Step-to pattern;Step-through pattern    Exercises Total Joint Exercises Ankle Circles/Pumps: AROM;Both;10 reps;Supine Quad Sets: AROM;Both;Supine;15 reps Heel Slides: AAROM;Supine;Left;15 reps Straight Leg Raises: AAROM;15 reps;Left;Supine   PT Diagnosis:    PT Problem List:   PT Treatment Interventions:     PT Goals Acute Rehab PT Goals PT Goal Formulation: With patient Time For Goal Achievement:  08/06/12 Potential to Achieve Goals: Good Pt will go Supine/Side to Sit: with supervision PT Goal: Supine/Side to Sit - Progress: Progressing toward goal Pt will go Sit to Supine/Side: with supervision PT Goal: Sit to Supine/Side - Progress: Progressing toward goal Pt will go Sit to Stand: with supervision PT Goal: Sit to Stand - Progress: Progressing toward goal Pt will go Stand to Sit: with supervision PT Goal: Stand to Sit - Progress: Progressing toward goal Pt will Ambulate: >150 feet;with supervision PT Goal: Ambulate - Progress: Progressing toward goal  Visit Information  Last PT Received On: 08/02/12 Assistance Needed: +1    Subjective Data  Subjective: Its hurting but I want to get up and try to move Patient Stated Goal: Resume previous lifestyle with decreased pain   Cognition  Overall Cognitive Status: Appears within functional limits for tasks assessed/performed Arousal/Alertness: Awake/alert Orientation Level: Appears intact for tasks assessed Behavior During Session: [facility] for tasks performed    Balance     End of Session PT - End of Session Equipment Utilized During Treatment: Left knee immobilizer Activity Tolerance: Patient limited by pain Patient left: Other (comment) (in bathroom) Nurse Communication: Mobility status;Patient requests pain meds   GP     Anab Vivar 08/02/2012, 12:05 PM

## 2012-08-03 LAB — CBC
HCT: 32.9 % — ABNORMAL LOW (ref 39.0–52.0)
Hemoglobin: 11.4 g/dL — ABNORMAL LOW (ref 13.0–17.0)
RDW: 12.8 % (ref 11.5–15.5)
WBC: 11.7 10*3/uL — ABNORMAL HIGH (ref 4.0–10.5)

## 2012-08-03 MED ORDER — OXYCODONE HCL 5 MG PO TABS: 5.0000 mg | ORAL_TABLET | ORAL | Status: DC | PRN

## 2012-08-03 MED ORDER — POLYETHYLENE GLYCOL 3350 17 G PO PACK: 17.0000 g | PACK | Freq: Every day | ORAL | Status: DC | PRN

## 2012-08-03 MED ORDER — BISACODYL 10 MG RE SUPP: 10.0000 mg | Freq: Every day | RECTAL | Status: DC | PRN

## 2012-08-03 MED ORDER — DSS 100 MG PO CAPS: 100.0000 mg | ORAL_CAPSULE | Freq: Two times a day (BID) | ORAL | Status: DC

## 2012-08-03 MED ORDER — ENOXAPARIN SODIUM 30 MG/0.3ML ~~LOC~~ SOLN: 30.0000 mg | Freq: Two times a day (BID) | SUBCUTANEOUS | Status: DC

## 2012-08-03 MED ORDER — METHOCARBAMOL 500 MG PO TABS: 500.0000 mg | ORAL_TABLET | Freq: Four times a day (QID) | ORAL | Status: DC | PRN

## 2012-08-03 MED ORDER — ENOXAPARIN (LOVENOX) PATIENT EDUCATION KIT
PACK | Freq: Once | Status: DC
  Filled 2012-08-03: qty 1

## 2012-08-03 NOTE — Progress Notes (Signed)
Physical Therapy Treatment Patient Details Name: [name] MRN: [id number] DOB: 08-22-1951 Today's Date: 08/03/2012 Time: 0900-0928 PT Time Calculation (min): 28 min  PT Assessment / Plan / Recommendation Comments on Treatment Session  doing well this am    Follow Up Recommendations  Home health PT    Barriers to Discharge        Equipment Recommendations  Rolling walker with 5" wheels    Recommendations for Other Services    Frequency 7X/week   Plan Discharge plan remains appropriate    Precautions / Restrictions Precautions Precautions: Knee Required Braces or Orthoses: Knee Immobilizer - Left Knee Immobilizer - Left: Discontinue once straight leg raise with < 10 degree lag Restrictions Weight Bearing Restrictions: No Other Position/Activity Restrictions: WBAT   Pertinent Vitals/Pain     Mobility  Bed Mobility Bed Mobility: Supine to Sit Supine to Sit: 4: Min guard Details for Bed Mobility Assistance: pt self assisting L LE with UEs Transfers Transfers: Sit to Stand;Stand to Sit Sit to Stand: 5: Supervision;6: Modified independent (Device/Increase time) Stand to Sit: 5: Supervision;6: Modified independent (Device/Increase time) Details for Transfer Assistance: min cues for use of UEs Ambulation/Gait Ambulation/Gait Assistance: 5: Supervision;6: Modified independent (Device/Increase time) Ambulation Distance (Feet): 280 Feet Assistive device: Rolling walker Ambulation/Gait Assistance Details: min cues for posture and position from RW Gait Pattern: Step-to pattern;Step-through pattern Stairs: Yes Stairs Assistance: 4: Min assist;4: Min guard Stairs Assistance Details (indicate cue type and reason): cues for technique Stair Management Technique: Step to pattern;With walker Number of Stairs: 1     Exercises Total Joint Exercises Ankle Circles/Pumps: AROM;Both;10 reps;Supine Quad Sets: AROM;Both;Supine;15 reps Heel Slides: AAROM;Supine;Left;15 reps Hip  ABduction/ADduction: AAROM;10 reps;Left Straight Leg Raises: AAROM;10 reps   PT Diagnosis:    PT Problem List:   PT Treatment Interventions:     PT Goals Acute Rehab PT Goals Time For Goal Achievement: 08/06/12 Potential to Achieve Goals: Good Pt will go Supine/Side to Sit: with supervision PT Goal: Supine/Side to Sit - Progress: Met Pt will go Sit to Stand: with supervision PT Goal: Sit to Stand - Progress: Met Pt will go Stand to Sit: with supervision PT Goal: Stand to Sit - Progress: Met Pt will Ambulate: >150 feet;with supervision PT Goal: Ambulate - Progress: Met Pt will Go Up / Down Stairs: 1-2 stairs;with min assist;with least restrictive assistive device PT Goal: Up/Down Stairs - Progress: Met  Visit Information  Last PT Received On: 08/03/12 Assistance Needed: +1    Subjective Data  Subjective: I am doing well   Cognition  Overall Cognitive Status: Appears within functional limits for tasks assessed/performed Arousal/Alertness: Awake/alert Orientation Level: Appears intact for tasks assessed Behavior During Session: [facility] for tasks performed    Balance     End of Session PT - End of Session Equipment Utilized During Treatment: Left knee immobilizer Activity Tolerance: Patient limited by pain Patient left: in chair;with call bell/phone within reach Nurse Communication: Mobility status CPM Left Knee CPM Left Knee: Off   GP     [facility] 08/03/2012, 9:41 AM

## 2012-08-03 NOTE — Care Management Note (Unsigned)
    Page 1 of 1   08/03/2012     2:28:18 PM   CARE MANAGEMENT NOTE 08/03/2012  Patient:  [name], [name]   Account Number:  [id number]  Date Initiated:  08/03/2012  Documentation initiated by:  Konrad Felix  Subjective/Objective Assessment:   Patient admitted s/p surgical intervention of left knee.     Action/Plan:   Discharge to home when medically stable.   Anticipated DC Date:  08/03/2012   Anticipated DC Plan:  HOME W HOME HEALTH SERVICES      DC Planning Services  CM consult      [facility] Choice  HOME HEALTH   Choice offered to / List presented to:             Status of service:  In process, will continue to follow Medicare Important Message given?   (If response is "NO", the following Medicare IM given date fields will be blank) Date Medicare IM given:   Date Additional Medicare IM given:    Discharge Disposition:  HOME W HOME HEALTH SERVICES  Per UR Regulation:  Reviewed for med. necessity/level of care/duration of stay  If discussed at Long Length of Stay Meetings, dates discussed:    Comments:  08/03/2012  2:00pm  Konrad Felix RN, case manager   [phone] Notified by fellow CM that patient was going to be discharged to home with home health services. However, patient was discharged to home prior to me discussing his home health needs and wishes. I have left a detailed message on the identified home answering machine and will await a call back. I also noticed that PT had recommended a rolling walker but that was not ordered. The order for [name] PT was placed on 07/31/2012.

## 2012-08-03 NOTE — Progress Notes (Signed)
Subjective: Patient states his pain is fairly well controlled a little difficult time yesterday with a temperature he reports 100.6 also some nausea. He denies any shortness of breath or chest pain. He states has not had a bowel movement yet but he does not feel back up her nauseous with food does have an appetite this morning any questions when he could be discharged to go home.   Objective: Vital signs in last 24 hours: Temp:  [98.9 F (37.2 C)-100.6 F (38.1 C)] 98.9 F (37.2 C) (09/20 0425) Pulse Rate:  [78-98] 78  (09/20 0425) Resp:  [14-20] 16  (09/20 0425) BP: (123-151)/(75-83) 123/75 mmHg (09/20 0425) SpO2:  [95 %-97 %] 97 % (09/20 0425)  Intake/Output from previous day: 09/19 0701 - 09/20 0700 In: 480 [P.O.:480] Out: 750 [Urine:750] Intake/Output this shift: Total I/O In: 240 [P.O.:240] Out: -    Basename 08/03/12 0355 08/02/12 0424 08/01/12 0405  HGB 11.4* 11.0* 11.4*    Basename 08/03/12 0355 08/02/12 0424  WBC 11.7* 11.2*  RBC 3.60* 3.47*  HCT 32.9* 31.7*  PLT 204 208    Basename 08/01/12 0405  NA 139  K 3.7  CL 104  CO2 25  BUN 9  CREATININE 0.73  GLUCOSE 111*  CALCIUM 8.4   No results found for this basename: LABPT:2,INR:2 in the last 72 hours  Patient is conscious alert appropriate appears to be in no distress. His bowel sounds are present throughout a very active. His left knee is well approximated with Steri-Strips no signs of infection no pressure blisters no drainage the dressing came off clean calf and thigh are soft and nontender his leg is neuromotor vascularly intact  Assessment/Plan: Postop day #3 status post left total knee arthroplasty doing very well. Progressed nicely with physical therapy. Postoperative labs stable with hemoglobin 11.4 asymptomatic rollout self correct with by mouth supplementation GERD stable asymptomatic Coronary artery disease asymptomatic no shortness of breath chest discomfort with activities and physical  therapy  Plan patient will continue with physical therapy this morning. Nursing staff will instruct on Lovenox injection. Patient is encouraged to drink fluids and continue with stool softeners and increase activity. We'll discharge home later this morning he continues to do well. Postop followup in the office in 2 weeks he does have arrangements made for home health physical therapy and CPM   Jamelle Rushing 08/03/2012, 6:48 AM

## 2012-08-03 NOTE — Discharge Summary (Signed)
Physician Discharge Summary  Patient ID: [name] MRN: [id number] DOB/AGE: May 11, 1951 61 y.o.  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 Valma Cava who was taken to the or were a left total knee arthroplasty was performed under spinal anesthesia there were no complications patient was transferred to the recovery room with one Hemovac drain in place to follow total knee protocol. Patient had three-day postoperative course orthopedic floor without any significant complications in his left total knee arthroplasty his postoperative labs showed minimal changes of his hemoglobin hemoglobin dropped from 16 down to 11.4 patient remained asymptomatic his electrolytes remained within normal limits. Patient participated with physical therapy and CPM on a daily basis without any significant issues in progressed very nicely. Did have low-grade temp 100.6 postop day #2 with some nausea but this did resolve. Post op day #3 patient was told he was ready for discharge home he did progress nicely with physical therapy he no longer had any nausea or vomiting her range is from the for him to be discharged home with a 2 week followup appointment.  Consults: None  Significant Diagnostic Studies: Routine postop visit CBC and been BMET  Treatments: surgery: For his osteoarthritis and total knee arthroplasty otherwise routine  postoperative care  Discharge Exam: Blood pressure 123/75, pulse 78, temperature 98.9 F (37.2 C), temperature source Oral, resp. rate 16, height 5\' 11"  (1.803 m), weight 98 kg (216 lb 0.8 oz), SpO2 97.00%. Patient on postop day #3 was conscious alert and appropriate appears to be very comfortable in no distress abdomen was soft very active bowel sounds left lower extremity the wound was well approximate Steri-Strips no signs of pressure blisters swelling or infection calf and thigh were soft nontender his leg was neuromotor vascularly intact  Disposition: 01-Home or Self Care  Discharge Orders    Future Appointments: Provider: Department: Dept Phone: Center:   08/10/2012 10:45 AM Tresa Garter, MD [facility] [phone] [facility]   12/21/2012 1:45 PM Tresa Garter, MD [facility] [phone] [facility]       Medication List     As of 08/03/2012  6:57 AM    ASK your doctor about these medications         aspirin 81 MG tablet   Take 81 mg by mouth daily.      celecoxib 200 MG capsule   Commonly known as: CELEBREX   Take 200-400 mg by mouth daily as needed. For pain      cholecalciferol 400 UNITS Tabs   Commonly known as: VITAMIN D   Take 400 Units by mouth daily.      cyanocobalamin 500 MCG tablet   Take 1,000 mcg by mouth daily.      fish oil-omega-3 fatty acids 1000 MG capsule   Take 2 g by mouth daily.      glucosamine-chondroitin 500-400 MG tablet   Take 1 tablet by mouth 3 (three) times daily.      KLOR-CON PO   Take 1 tablet by mouth daily.      metoCLOPramide 10  MG tablet   Commonly known as: REGLAN   Take 10 mg by mouth 4 (four) times daily.      mometasone 50 MCG/ACT nasal spray   Commonly known as: NASONEX   Place 2 sprays into the nose daily.      multivitamin tablet   Take 1 tablet by mouth daily.      pantoprazole 40 MG tablet   Commonly known as: PROTONIX   Take 40 mg by mouth daily.      Red Yeast Rice 600 MG Caps   Take 1,200 capsules by  mouth daily.      traMADol 50 MG tablet   Commonly known as: ULTRAM   Take 50-100 mg by mouth 2 (two) times daily as needed. For pain      valACYclovir 500 MG tablet   Commonly known as: VALTREX   Take 500 mg by mouth daily.         SignedJamelle Rushing 08/03/2012, 6:57 AM

## 2012-08-10 ENCOUNTER — Ambulatory Visit: Payer: Managed Care, Other (non HMO) | Admitting: Internal Medicine

## 2012-10-01 ENCOUNTER — Other Ambulatory Visit: Payer: Self-pay | Admitting: Internal Medicine

## 2012-11-21 ENCOUNTER — Other Ambulatory Visit: Payer: Self-pay | Admitting: Internal Medicine

## 2012-12-21 ENCOUNTER — Ambulatory Visit (INDEPENDENT_AMBULATORY_CARE_PROVIDER_SITE_OTHER): Payer: BC Managed Care – PPO | Admitting: Internal Medicine

## 2012-12-21 ENCOUNTER — Encounter: Payer: Self-pay | Admitting: Internal Medicine

## 2012-12-21 VITALS — BP 138/78 | HR 84 | Temp 99.1°F | Resp 16 | Ht 71.0 in | Wt 250.0 lb

## 2012-12-21 DIAGNOSIS — M25569 Pain in unspecified knee: Secondary | ICD-10-CM

## 2012-12-21 DIAGNOSIS — Z96659 Presence of unspecified artificial knee joint: Secondary | ICD-10-CM

## 2012-12-21 DIAGNOSIS — Z0000 Encounter for general adult medical examination without abnormal findings: Secondary | ICD-10-CM

## 2012-12-21 DIAGNOSIS — I2510 Atherosclerotic heart disease of native coronary artery without angina pectoris: Secondary | ICD-10-CM

## 2012-12-21 HISTORY — DX: Presence of unspecified artificial knee joint: Z96.659

## 2012-12-21 MED ORDER — MELOXICAM 15 MG PO TABS: 15.0000 mg | ORAL_TABLET | Freq: Every day | ORAL | Status: DC | PRN

## 2012-12-21 NOTE — Assessment & Plan Note (Addendum)
L 9/14 - post-op swelling and pain long term Loose wt that you gained 30 lbs Meloxicam 15 mg/d

## 2012-12-21 NOTE — Assessment & Plan Note (Signed)
We discussed age appropriate health related issues, including available/recomended screening tests and vaccinations. We discussed a need for adhering to healthy diet and exercise. Labs/EKG were reviewed/ordered. All questions were answered.   

## 2012-12-21 NOTE — Assessment & Plan Note (Signed)
Continue with current prescription therapy as reflected on the Med list.  

## 2012-12-21 NOTE — Progress Notes (Signed)
Subjective:    HPI  The patient is here for a wellness exam. The patient has been doing well overall without major physical or psychological issues going on lately, except for knee pain S/p L TKR 9/13 - still in pain - couldn't work - lost his job C/o L knee pain 5-6/10 on tramadol C/o 30 lbs wt gain  Wt Readings from Last 3 Encounters:  12/21/12 250 lb (113.399 kg)  07/31/12 216 lb 0.8 oz (98 kg)  07/31/12 216 lb 0.8 oz (98 kg)   BP Readings from Last 3 Encounters:  12/21/12 138/78  08/03/12 123/75  08/03/12 123/75      Review of Systems  Constitutional: Negative for appetite change, fatigue and unexpected weight change.  HENT: Negative for nosebleeds, congestion, sore throat, sneezing, trouble swallowing and neck pain.   Eyes: Negative for itching and visual disturbance.  Respiratory: Negative for cough.   Cardiovascular: Negative for chest pain, palpitations and leg swelling.  Gastrointestinal: Negative for nausea, diarrhea, blood in stool and abdominal distention.  Genitourinary: Negative for frequency and hematuria.  Musculoskeletal: Positive for arthralgias (knees) and gait problem. Negative for back pain and joint swelling.  Skin: Negative for rash.  Neurological: Negative for dizziness, tremors, speech difficulty and weakness.  Psychiatric/Behavioral: Negative for sleep disturbance, dysphoric mood and agitation. The patient is not nervous/anxious.        Objective:   Physical Exam  Constitutional: He is oriented to person, place, and time. He appears well-developed and well-nourished. No distress.  HENT:  Head: Normocephalic and atraumatic.  Right Ear: External ear normal.  Left Ear: External ear normal.  Nose: Nose normal.  Mouth/Throat: Oropharynx is clear and moist. No oropharyngeal exudate.  Eyes: Conjunctivae normal and EOM are normal. Pupils are equal, round, and reactive to light. Right eye exhibits no discharge. Left eye exhibits no discharge. No  scleral icterus.  Neck: Normal range of motion. Neck supple. No JVD present. No tracheal deviation present. No thyromegaly present.  Cardiovascular: Normal rate, regular rhythm, normal heart sounds and intact distal pulses.  Exam reveals no gallop and no friction rub.   No murmur heard. Pulmonary/Chest: Effort normal and breath sounds normal. No stridor. No respiratory distress. He has no wheezes. He has no rales. He exhibits no tenderness.  Abdominal: Soft. Bowel sounds are normal. He exhibits no distension and no mass. There is no tenderness. There is no rebound and no guarding.  Genitourinary: Rectum normal, prostate normal and penis normal. Guaiac negative stool. No penile tenderness.  Musculoskeletal: Normal range of motion. He exhibits no edema and no tenderness.       B knees are tender  Lymphadenopathy:    He has no cervical adenopathy.  Neurological: He is alert and oriented to person, place, and time. He has normal reflexes. No cranial nerve deficit. He exhibits normal muscle tone. Coordination normal.  Skin: Skin is warm and dry. No rash noted. He is not diaphoretic. No erythema. No pallor.  Psychiatric: He has a normal mood and affect. His behavior is normal. Judgment and thought content normal.  L knee is swollen and tender; restr ROM  Lab Results  Component Value Date   WBC 11.7* 08/03/2012   HGB 11.4* 08/03/2012   HCT 32.9* 08/03/2012   PLT 204 08/03/2012   CHOL 217* 07/26/2011   TRIG 277.0* 07/26/2011   HDL 38.70* 07/26/2011   LDLDIRECT 121.6 07/26/2011   ALT 25 07/25/2012   AST 18 07/25/2012   NA 139 08/01/2012  K 3.7 08/01/2012   CL 104 08/01/2012   CREATININE 0.73 08/01/2012   BUN 9 08/01/2012   CO2 25 08/01/2012   TSH 3.25 06/18/2012   PSA 0.69 07/26/2011   INR 0.91 07/25/2012   HGBA1C 5.6 05/02/2008       Assessment & Plan:

## 2012-12-21 NOTE — Assessment & Plan Note (Signed)
L 9/14 - post-op swelling and pain long term Loose wt that you gained 30 lbs Meloxicam 15 mg/d Pursue disability

## 2012-12-31 ENCOUNTER — Other Ambulatory Visit: Payer: Self-pay | Admitting: Internal Medicine

## 2013-02-18 ENCOUNTER — Telehealth: Payer: Self-pay | Admitting: *Deleted

## 2013-02-18 DIAGNOSIS — Z0000 Encounter for general adult medical examination without abnormal findings: Secondary | ICD-10-CM

## 2013-02-18 DIAGNOSIS — Z0389 Encounter for observation for other suspected diseases and conditions ruled out: Secondary | ICD-10-CM

## 2013-02-18 NOTE — Telephone Encounter (Signed)
Labs entered.

## 2013-02-18 NOTE — Telephone Encounter (Signed)
Message copied by Merrilyn Puma on Mon Feb 18, 2013  4:34 PM ------      Message from: Etheleen Sia      Created: Fri Dec 21, 2012  2:21 PM      Regarding: LAB       PHYSICAL LABS FOR AUG  ------

## 2013-03-05 ENCOUNTER — Other Ambulatory Visit: Payer: Self-pay | Admitting: Internal Medicine

## 2013-06-18 ENCOUNTER — Ambulatory Visit (INDEPENDENT_AMBULATORY_CARE_PROVIDER_SITE_OTHER): Payer: BC Managed Care – PPO

## 2013-06-18 DIAGNOSIS — Z0389 Encounter for observation for other suspected diseases and conditions ruled out: Secondary | ICD-10-CM

## 2013-06-18 DIAGNOSIS — Z0000 Encounter for general adult medical examination without abnormal findings: Secondary | ICD-10-CM

## 2013-06-18 DIAGNOSIS — R7989 Other specified abnormal findings of blood chemistry: Secondary | ICD-10-CM

## 2013-06-18 LAB — HEPATIC FUNCTION PANEL
Alkaline Phosphatase: 54 U/L (ref 39–117)
Bilirubin, Direct: 0.1 mg/dL (ref 0.0–0.3)

## 2013-06-18 LAB — URINALYSIS, ROUTINE W REFLEX MICROSCOPIC
Nitrite: NEGATIVE
RBC / HPF: NONE SEEN (ref 0–?)
Specific Gravity, Urine: 1.01 (ref 1.000–1.030)
Total Protein, Urine: NEGATIVE
Urine Glucose: NEGATIVE
Urobilinogen, UA: 0.2 (ref 0.0–1.0)
WBC, UA: NONE SEEN (ref 0–?)

## 2013-06-18 LAB — CBC WITH DIFFERENTIAL/PLATELET
Basophils Relative: 0.8 % (ref 0.0–3.0)
Eosinophils Relative: 4.2 % (ref 0.0–5.0)
HCT: 43.6 % (ref 39.0–52.0)
MCV: 93.7 fl (ref 78.0–100.0)
Monocytes Absolute: 0.7 10*3/uL (ref 0.1–1.0)
Monocytes Relative: 10.2 % (ref 3.0–12.0)
Neutrophils Relative %: 44.1 % (ref 43.0–77.0)
RBC: 4.65 Mil/uL (ref 4.22–5.81)
WBC: 6.7 10*3/uL (ref 4.5–10.5)

## 2013-06-18 LAB — BASIC METABOLIC PANEL
Calcium: 9.5 mg/dL (ref 8.4–10.5)
Creatinine, Ser: 0.8 mg/dL (ref 0.4–1.5)
GFR: 112.92 mL/min (ref >60.00)
Sodium: 139 mEq/L (ref 135–145)

## 2013-06-18 LAB — LIPID PANEL
Cholesterol: 223 mg/dL — ABNORMAL HIGH (ref 0–200)
HDL: 34.3 mg/dL — ABNORMAL LOW (ref >39.00)
Total CHOL/HDL Ratio: 7
Triglycerides: 476 mg/dL — ABNORMAL HIGH (ref 0.0–149.0)
VLDL: 95.2 mg/dL — ABNORMAL HIGH (ref 0.0–40.0)

## 2013-06-18 LAB — TSH: TSH: 2.48 u[IU]/mL (ref 0.35–5.50)

## 2013-06-21 ENCOUNTER — Encounter: Payer: Self-pay | Admitting: Internal Medicine

## 2013-06-21 ENCOUNTER — Ambulatory Visit (INDEPENDENT_AMBULATORY_CARE_PROVIDER_SITE_OTHER): Payer: BC Managed Care – PPO | Admitting: Internal Medicine

## 2013-06-21 VITALS — BP 120/80 | HR 80 | Temp 98.2°F | Resp 16 | Ht 71.0 in | Wt 250.0 lb

## 2013-06-21 DIAGNOSIS — E785 Hyperlipidemia, unspecified: Secondary | ICD-10-CM

## 2013-06-21 DIAGNOSIS — I2510 Atherosclerotic heart disease of native coronary artery without angina pectoris: Secondary | ICD-10-CM

## 2013-06-21 DIAGNOSIS — K2270 Barrett's esophagus without dysplasia: Secondary | ICD-10-CM

## 2013-06-21 DIAGNOSIS — Z96652 Presence of left artificial knee joint: Secondary | ICD-10-CM

## 2013-06-21 DIAGNOSIS — M25562 Pain in left knee: Secondary | ICD-10-CM

## 2013-06-21 DIAGNOSIS — Z0000 Encounter for general adult medical examination without abnormal findings: Secondary | ICD-10-CM

## 2013-06-21 DIAGNOSIS — M25569 Pain in unspecified knee: Secondary | ICD-10-CM

## 2013-06-21 DIAGNOSIS — R7309 Other abnormal glucose: Secondary | ICD-10-CM

## 2013-06-21 DIAGNOSIS — R739 Hyperglycemia, unspecified: Secondary | ICD-10-CM

## 2013-06-21 HISTORY — DX: Hyperlipidemia, unspecified: E78.5

## 2013-06-21 MED ORDER — CELECOXIB 200 MG PO CAPS: 200.0000 mg | ORAL_CAPSULE | Freq: Two times a day (BID) | ORAL | Status: DC

## 2013-06-21 NOTE — Assessment & Plan Note (Signed)
Chronic  Elev TG, low HDL, nl LDL Loose wt, eat better

## 2013-06-21 NOTE — Patient Instructions (Signed)
Loose weight, eat better

## 2013-06-21 NOTE — Assessment & Plan Note (Signed)
We discussed age appropriate health related issues, including available/recomended screening tests and vaccinations. We discussed a need for adhering to healthy diet and exercise. Labs/EKG were reviewed/ordered. All questions were answered.   

## 2013-06-21 NOTE — Assessment & Plan Note (Signed)
L 9/14 - post-op swelling and pain long term - not better Applying for SS disability D/c Meloxicam, start Celebrex

## 2013-06-21 NOTE — Assessment & Plan Note (Signed)
Continue with current prescription therapy as reflected on the Med list.  

## 2013-06-21 NOTE — Progress Notes (Signed)
Subjective:    HPI  The patient is here for a wellness exam. The patient has been doing well overall without major physical or psychological issues going on lately, except for knee pain S/p L TKR 9/13 - still in pain - couldn't work - lost his job C/o L knee pain 5-6/10 on tramadol C/o 30 lbs wt gain  Wt Readings from Last 3 Encounters:  06/21/13 250 lb (113.399 kg)  12/21/12 250 lb (113.399 kg)  07/31/12 216 lb 0.8 oz (98 kg)   BP Readings from Last 3 Encounters:  06/21/13 120/80  12/21/12 138/78  08/03/12 123/75      Review of Systems  Constitutional: Positive for unexpected weight change. Negative for appetite change and fatigue.  HENT: Negative for nosebleeds, congestion, sore throat, sneezing, trouble swallowing and neck pain.   Eyes: Negative for itching and visual disturbance.  Respiratory: Negative for cough.   Cardiovascular: Negative for chest pain, palpitations and leg swelling.  Gastrointestinal: Negative for nausea, diarrhea, blood in stool and abdominal distention.  Genitourinary: Negative for frequency and hematuria.  Musculoskeletal: Positive for arthralgias (knees) and gait problem. Negative for back pain and joint swelling.  Skin: Negative for rash and wound.  Neurological: Negative for dizziness, tremors, speech difficulty and weakness.  Psychiatric/Behavioral: Negative for suicidal ideas, sleep disturbance, dysphoric mood and agitation. The patient is not nervous/anxious.        Objective:   Physical Exam  Constitutional: He is oriented to person, place, and time. He appears well-developed and well-nourished. No distress.  HENT:  Head: Normocephalic and atraumatic.  Right Ear: External ear normal.  Left Ear: External ear normal.  Nose: Nose normal.  Mouth/Throat: Oropharynx is clear and moist. No oropharyngeal exudate.  Eyes: Conjunctivae and EOM are normal. Pupils are equal, round, and reactive to light. Right eye exhibits no discharge. Left eye  exhibits no discharge. No scleral icterus.  Neck: Normal range of motion. Neck supple. No JVD present. No tracheal deviation present. No thyromegaly present.  Cardiovascular: Normal rate, regular rhythm, normal heart sounds and intact distal pulses.  Exam reveals no gallop and no friction rub.   No murmur heard. Pulmonary/Chest: Effort normal and breath sounds normal. No stridor. No respiratory distress. He has no wheezes. He has no rales. He exhibits no tenderness.  Abdominal: Soft. Bowel sounds are normal. He exhibits no distension and no mass. There is no tenderness. There is no rebound and no guarding.  Genitourinary: Rectum normal, prostate normal and penis normal. Guaiac negative stool. No penile tenderness.  Musculoskeletal: Normal range of motion. He exhibits no edema and no tenderness.  B knees are tender: L>>R L knee is swollen  Lymphadenopathy:    He has no cervical adenopathy.  Neurological: He is alert and oriented to person, place, and time. He has normal reflexes. No cranial nerve deficit. He exhibits normal muscle tone. Coordination normal.  Skin: Skin is warm and dry. No rash noted. He is not diaphoretic. No erythema. No pallor.  Psychiatric: He has a normal mood and affect. His behavior is normal. Judgment and thought content normal.  L knee is less swollen and tender; restr ROM  Lab Results  Component Value Date   WBC 6.7 06/18/2013   HGB 14.9 06/18/2013   HCT 43.6 06/18/2013   PLT 228.0 06/18/2013   CHOL 223* 06/18/2013   TRIG 476.0 Triglyceride is over 400; calculations on Lipids are invalid.* 06/18/2013   HDL 34.30* 06/18/2013   LDLDIRECT 91.6 06/18/2013   ALT  29 06/18/2013   AST 20 06/18/2013   NA 139 06/18/2013   K 3.9 06/18/2013   CL 108 06/18/2013   CREATININE 0.8 06/18/2013   BUN 10 06/18/2013   CO2 24 06/18/2013   TSH 2.48 06/18/2013   PSA 0.74 06/18/2013   INR 0.91 07/25/2012   HGBA1C 5.6 05/02/2008       Assessment & Plan:

## 2013-06-21 NOTE — Assessment & Plan Note (Signed)
Doing well 

## 2013-06-21 NOTE — Assessment & Plan Note (Signed)
L 9/14 - post-op swelling and pain long term - not better Applying for SS disability D/c Meloxicam Start Celebrex Continue with current prescription therapy as reflected on the Med list.

## 2013-07-11 ENCOUNTER — Other Ambulatory Visit: Payer: Self-pay | Admitting: Internal Medicine

## 2013-08-05 ENCOUNTER — Other Ambulatory Visit: Payer: Self-pay | Admitting: Internal Medicine

## 2013-08-09 ENCOUNTER — Other Ambulatory Visit: Payer: Self-pay | Admitting: Internal Medicine

## 2013-08-12 ENCOUNTER — Encounter: Payer: Self-pay | Admitting: Internal Medicine

## 2013-08-12 ENCOUNTER — Ambulatory Visit (INDEPENDENT_AMBULATORY_CARE_PROVIDER_SITE_OTHER): Payer: BC Managed Care – PPO | Admitting: Internal Medicine

## 2013-08-12 VITALS — BP 120/80 | HR 80 | Temp 97.5°F | Resp 16 | Wt 241.0 lb

## 2013-08-12 DIAGNOSIS — J329 Chronic sinusitis, unspecified: Secondary | ICD-10-CM

## 2013-08-12 DIAGNOSIS — R0600 Dyspnea, unspecified: Secondary | ICD-10-CM

## 2013-08-12 DIAGNOSIS — Z23 Encounter for immunization: Secondary | ICD-10-CM

## 2013-08-12 DIAGNOSIS — R0609 Other forms of dyspnea: Secondary | ICD-10-CM

## 2013-08-12 DIAGNOSIS — J019 Acute sinusitis, unspecified: Secondary | ICD-10-CM

## 2013-08-12 DIAGNOSIS — K2270 Barrett's esophagus without dysplasia: Secondary | ICD-10-CM

## 2013-08-12 HISTORY — DX: Chronic sinusitis, unspecified: J32.9

## 2013-08-12 MED ORDER — UMECLIDINIUM-VILANTEROL 62.5-25 MCG/INH IN AEPB: 1.0000 | INHALATION_SPRAY | Freq: Every day | RESPIRATORY_TRACT | Status: DC

## 2013-08-12 MED ORDER — METHYLPREDNISOLONE ACETATE 80 MG/ML IJ SUSP
80.0000 mg | Freq: Once | INTRAMUSCULAR | Status: AC
  Administered 2013-08-12: 80 mg via INTRAMUSCULAR

## 2013-08-12 NOTE — Assessment & Plan Note (Signed)
Continue with current prescription therapy as reflected on the Med list.  

## 2013-08-12 NOTE — Assessment & Plan Note (Addendum)
Sleep test is pending Sinus CT. May need ENT ref Empiric Anoro 1 inh qd Depomedrol 80 mg IM

## 2013-08-12 NOTE — Assessment & Plan Note (Signed)
CT sinuses Depomedrol 80 mg

## 2013-08-12 NOTE — Progress Notes (Signed)
Subjective:    HPI  C/o sinus congestion, SOB He had OSA during endoscopy  S/p L TKR 9/13 - still in pain - couldn't work - lost his job F/u L knee pain 5-6/10 on tramadol F/u  30 lbs wt gain - lost 9 lbs  Wt Readings from Last 3 Encounters:  08/12/13 241 lb (109.317 kg)  06/21/13 250 lb (113.399 kg)  12/21/12 250 lb (113.399 kg)   BP Readings from Last 3 Encounters:  08/12/13 120/80  06/21/13 120/80  12/21/12 138/78      Review of Systems  Constitutional: Positive for unexpected weight change. Negative for appetite change and fatigue.  HENT: Negative for nosebleeds, congestion, sore throat, sneezing, trouble swallowing and neck pain.   Eyes: Negative for itching and visual disturbance.  Respiratory: Negative for cough.   Cardiovascular: Negative for chest pain, palpitations and leg swelling.  Gastrointestinal: Negative for nausea, diarrhea, blood in stool and abdominal distention.  Genitourinary: Negative for frequency and hematuria.  Musculoskeletal: Positive for arthralgias (knees) and gait problem. Negative for back pain and joint swelling.  Skin: Negative for rash and wound.  Neurological: Negative for dizziness, tremors, speech difficulty and weakness.  Psychiatric/Behavioral: Negative for suicidal ideas, sleep disturbance, dysphoric mood and agitation. The patient is not nervous/anxious.        Objective:   Physical Exam  Constitutional: He is oriented to person, place, and time. He appears well-developed and well-nourished. No distress.  HENT:  Head: Normocephalic and atraumatic.  Right Ear: External ear normal.  Left Ear: External ear normal.  Nose: Nose normal.  Mouth/Throat: Oropharynx is clear and moist. No oropharyngeal exudate.  Eyes: Conjunctivae and EOM are normal. Pupils are equal, round, and reactive to light. Right eye exhibits no discharge. Left eye exhibits no discharge. No scleral icterus.  Neck: Normal range of motion. Neck supple. No JVD  present. No tracheal deviation present. No thyromegaly present.  Cardiovascular: Normal rate, regular rhythm, normal heart sounds and intact distal pulses.  Exam reveals no gallop and no friction rub.   No murmur heard. Pulmonary/Chest: Effort normal and breath sounds normal. No stridor. No respiratory distress. He has no wheezes. He has no rales. He exhibits no tenderness.  Abdominal: Soft. Bowel sounds are normal. He exhibits no distension and no mass. There is no tenderness. There is no rebound and no guarding.  Genitourinary: Rectum normal, prostate normal and penis normal. Guaiac negative stool. No penile tenderness.  Musculoskeletal: Normal range of motion. He exhibits no edema and no tenderness.  B knees are tender: L>>R L knee is swollen  Lymphadenopathy:    He has no cervical adenopathy.  Neurological: He is alert and oriented to person, place, and time. He has normal reflexes. No cranial nerve deficit. He exhibits normal muscle tone. Coordination normal.  Skin: Skin is warm and dry. No rash noted. He is not diaphoretic. No erythema. No pallor.  Psychiatric: He has a normal mood and affect. His behavior is normal. Judgment and thought content normal.  L knee is less swollen and tender; restr ROM Swollen nasal mucosa Lab Results  Component Value Date   WBC 6.7 06/18/2013   HGB 14.9 06/18/2013   HCT 43.6 06/18/2013   PLT 228.0 06/18/2013   CHOL 223* 06/18/2013   TRIG 476.0 Triglyceride is over 400; calculations on Lipids are invalid.* 06/18/2013   HDL 34.30* 06/18/2013   LDLDIRECT 91.6 06/18/2013   ALT 29 06/18/2013   AST 20 06/18/2013   NA 139 06/18/2013  K 3.9 06/18/2013   CL 108 06/18/2013   CREATININE 0.8 06/18/2013   BUN 10 06/18/2013   CO2 24 06/18/2013   TSH 2.48 06/18/2013   PSA 0.74 06/18/2013   INR 0.91 07/25/2012   HGBA1C 5.6 05/02/2008   I personally provided Anoro inhaler use teaching. After the teaching patient was able to demonstrate it's use effectively. All questions were answered      Assessment & Plan:

## 2013-08-14 ENCOUNTER — Other Ambulatory Visit: Payer: BC Managed Care – PPO

## 2013-08-15 ENCOUNTER — Ambulatory Visit (INDEPENDENT_AMBULATORY_CARE_PROVIDER_SITE_OTHER)
Admission: RE | Admit: 2013-08-15 | Discharge: 2013-08-15 | Disposition: A | Discharge status: Home / Self Care | Payer: BC Managed Care – PPO | Source: Ambulatory Visit | Attending: Internal Medicine | Admitting: Internal Medicine

## 2013-08-15 ENCOUNTER — Other Ambulatory Visit: Payer: Self-pay | Admitting: Internal Medicine

## 2013-08-15 DIAGNOSIS — J329 Chronic sinusitis, unspecified: Secondary | ICD-10-CM

## 2013-08-15 DIAGNOSIS — R0600 Dyspnea, unspecified: Secondary | ICD-10-CM

## 2013-08-15 DIAGNOSIS — R0609 Other forms of dyspnea: Secondary | ICD-10-CM

## 2013-08-15 MED ORDER — DOXYCYCLINE HYCLATE 100 MG PO TABS: 100.0000 mg | ORAL_TABLET | Freq: Two times a day (BID) | ORAL | Status: DC

## 2013-08-16 ENCOUNTER — Telehealth: Payer: Self-pay

## 2013-08-16 NOTE — Telephone Encounter (Signed)
Phone call from patient stating he received a call regarding his CT results and to continue his medication. He says he was not prescribed an antibiotic, only an inhaler. I let him know Doxycycline was sent to his pharmacy yesterday so that should be ready for him. He expressed understanding.

## 2013-08-21 ENCOUNTER — Ambulatory Visit (INDEPENDENT_AMBULATORY_CARE_PROVIDER_SITE_OTHER): Payer: BC Managed Care – PPO | Admitting: Internal Medicine

## 2013-08-21 ENCOUNTER — Encounter: Payer: Self-pay | Admitting: Internal Medicine

## 2013-08-21 VITALS — BP 130/80 | HR 80 | Temp 98.0°F | Resp 16 | Wt 237.0 lb

## 2013-08-21 DIAGNOSIS — G4733 Obstructive sleep apnea (adult) (pediatric): Secondary | ICD-10-CM

## 2013-08-21 DIAGNOSIS — R0600 Dyspnea, unspecified: Secondary | ICD-10-CM

## 2013-08-21 DIAGNOSIS — J019 Acute sinusitis, unspecified: Secondary | ICD-10-CM

## 2013-08-21 DIAGNOSIS — J329 Chronic sinusitis, unspecified: Secondary | ICD-10-CM

## 2013-08-21 DIAGNOSIS — R0609 Other forms of dyspnea: Secondary | ICD-10-CM

## 2013-08-21 HISTORY — DX: Obstructive sleep apnea (adult) (pediatric): G47.33

## 2013-08-21 MED ORDER — METHYLPREDNISOLONE ACETATE 80 MG/ML IJ SUSP
80.0000 mg | Freq: Once | INTRAMUSCULAR | Status: AC
  Administered 2013-08-21: 80 mg via INTRAMUSCULAR

## 2013-08-21 NOTE — Assessment & Plan Note (Signed)
He had a sleep test. Pulm appt is pending Cont /wt loss ENT ref as above

## 2013-08-21 NOTE — Assessment & Plan Note (Signed)
CT IMPRESSION:  Minimal sinus mucosal thickening, primarily in the frontal recesses.  Small right maxillary mucous retention cyst. No CT evidence of acute  sinusitis.  Electronically Signed  By: Augusto Gamble M.D.  On: 08/15/2013 09:19  Deepomedrol 80 mg IM

## 2013-08-21 NOTE — Progress Notes (Signed)
Subjective:    HPI  C/o sinus congestion, SOB - not better He had OSA during endoscopy; just had a sleep test  S/p L TKR 9/13 - still in pain - couldn't work - lost his job F/u L knee pain 5-6/10 on tramadol F/u  30 lbs wt gain - lost 13 lbs  Wt Readings from Last 3 Encounters:  08/21/13 237 lb (107.502 kg)  08/12/13 241 lb (109.317 kg)  06/21/13 250 lb (113.399 kg)   BP Readings from Last 3 Encounters:  08/21/13 130/80  08/12/13 120/80  06/21/13 120/80      Review of Systems  Constitutional: Positive for unexpected weight change. Negative for appetite change and fatigue.  HENT: Negative for congestion, nosebleeds, sneezing, sore throat and trouble swallowing.   Eyes: Negative for itching and visual disturbance.  Respiratory: Negative for cough.   Cardiovascular: Negative for chest pain, palpitations and leg swelling.  Gastrointestinal: Negative for nausea, diarrhea, blood in stool and abdominal distention.  Genitourinary: Negative for frequency and hematuria.  Musculoskeletal: Positive for arthralgias (knees) and gait problem. Negative for back pain, joint swelling and neck pain.  Skin: Negative for rash and wound.  Neurological: Negative for dizziness, tremors, speech difficulty and weakness.  Psychiatric/Behavioral: Negative for suicidal ideas, sleep disturbance, dysphoric mood and agitation. The patient is not nervous/anxious.        Objective:   Physical Exam  Constitutional: He is oriented to person, place, and time. He appears well-developed and well-nourished. No distress.  HENT:  Head: Normocephalic and atraumatic.  Right Ear: External ear normal.  Left Ear: External ear normal.  Nose: Nose normal.  Mouth/Throat: Oropharynx is clear and moist. No oropharyngeal exudate.  Eyes: Conjunctivae and EOM are normal. Pupils are equal, round, and reactive to light. Right eye exhibits no discharge. Left eye exhibits no discharge. No scleral icterus.  Neck: Normal  range of motion. Neck supple. No JVD present. No tracheal deviation present. No thyromegaly present.  Cardiovascular: Normal rate, regular rhythm, normal heart sounds and intact distal pulses.  Exam reveals no gallop and no friction rub.   No murmur heard. Pulmonary/Chest: Effort normal and breath sounds normal. No stridor. No respiratory distress. He has no wheezes. He has no rales. He exhibits no tenderness.  Abdominal: Soft. Bowel sounds are normal. He exhibits no distension and no mass. There is no tenderness. There is no rebound and no guarding.  Genitourinary: Rectum normal, prostate normal and penis normal. Guaiac negative stool. No penile tenderness.  Musculoskeletal: Normal range of motion. He exhibits no edema and no tenderness.  B knees are tender: L>>R L knee is swollen  Lymphadenopathy:    He has no cervical adenopathy.  Neurological: He is alert and oriented to person, place, and time. He has normal reflexes. No cranial nerve deficit. He exhibits normal muscle tone. Coordination normal.  Skin: Skin is warm and dry. No rash noted. He is not diaphoretic. No erythema. No pallor.  Psychiatric: He has a normal mood and affect. His behavior is normal. Judgment and thought content normal.  L knee is less swollen and tender; restr ROM Swollen nasal mucosa Lab Results  Component Value Date   WBC 6.7 06/18/2013   HGB 14.9 06/18/2013   HCT 43.6 06/18/2013   PLT 228.0 06/18/2013   CHOL 223* 06/18/2013   TRIG 476.0 Triglyceride is over 400; calculations on Lipids are invalid.* 06/18/2013   HDL 34.30* 06/18/2013   LDLDIRECT 91.6 06/18/2013   ALT 29 06/18/2013   AST  20 06/18/2013   NA 139 06/18/2013   K 3.9 06/18/2013   CL 108 06/18/2013   CREATININE 0.8 06/18/2013   BUN 10 06/18/2013   CO2 24 06/18/2013   TSH 2.48 06/18/2013   PSA 0.74 06/18/2013   INR 0.91 07/25/2012   HGBA1C 5.6 05/02/2008        Assessment & Plan:

## 2013-08-21 NOTE — Assessment & Plan Note (Signed)
CT was ok

## 2013-08-21 NOTE — Patient Instructions (Signed)
   Milk free trial (no milk, ice cream, cheese and yogurt) for 4-6 weeks. OK to use almond, coconut, rice milk. "Almond breeze" brand tastes good.  

## 2013-08-21 NOTE — Assessment & Plan Note (Signed)
D/c Anoro - no help Appt w/pulm pending ENT consult 

## 2013-08-21 NOTE — Assessment & Plan Note (Signed)
D/c Anoro - no help Appt w/pulm pending ENT consult

## 2013-08-22 ENCOUNTER — Encounter (HOSPITAL_COMMUNITY): Payer: Self-pay | Admitting: Emergency Medicine

## 2013-08-22 ENCOUNTER — Emergency Department (HOSPITAL_COMMUNITY)
Admission: EM | Admit: 2013-08-22 | Discharge: 2013-08-22 | Disposition: A | Discharge status: Home / Self Care | Payer: BC Managed Care – PPO | Attending: Emergency Medicine | Admitting: Emergency Medicine

## 2013-08-22 DIAGNOSIS — Z860100 Personal history of colon polyps, unspecified: Secondary | ICD-10-CM | POA: Insufficient documentation

## 2013-08-22 DIAGNOSIS — Z7982 Long term (current) use of aspirin: Secondary | ICD-10-CM | POA: Insufficient documentation

## 2013-08-22 DIAGNOSIS — J329 Chronic sinusitis, unspecified: Secondary | ICD-10-CM

## 2013-08-22 DIAGNOSIS — F17200 Nicotine dependence, unspecified, uncomplicated: Secondary | ICD-10-CM | POA: Insufficient documentation

## 2013-08-22 DIAGNOSIS — K219 Gastro-esophageal reflux disease without esophagitis: Secondary | ICD-10-CM | POA: Insufficient documentation

## 2013-08-22 DIAGNOSIS — Z8619 Personal history of other infectious and parasitic diseases: Secondary | ICD-10-CM | POA: Insufficient documentation

## 2013-08-22 DIAGNOSIS — IMO0002 Reserved for concepts with insufficient information to code with codable children: Secondary | ICD-10-CM | POA: Insufficient documentation

## 2013-08-22 DIAGNOSIS — Z79899 Other long term (current) drug therapy: Secondary | ICD-10-CM | POA: Insufficient documentation

## 2013-08-22 DIAGNOSIS — Z792 Long term (current) use of antibiotics: Secondary | ICD-10-CM | POA: Insufficient documentation

## 2013-08-22 DIAGNOSIS — Z872 Personal history of diseases of the skin and subcutaneous tissue: Secondary | ICD-10-CM | POA: Insufficient documentation

## 2013-08-22 DIAGNOSIS — M1710 Unilateral primary osteoarthritis, unspecified knee: Secondary | ICD-10-CM | POA: Insufficient documentation

## 2013-08-22 DIAGNOSIS — Z880 Allergy status to penicillin: Secondary | ICD-10-CM | POA: Insufficient documentation

## 2013-08-22 DIAGNOSIS — Z8669 Personal history of other diseases of the nervous system and sense organs: Secondary | ICD-10-CM | POA: Insufficient documentation

## 2013-08-22 MED ORDER — PREDNISONE 20 MG PO TABS
60.0000 mg | ORAL_TABLET | Freq: Once | ORAL | Status: AC
Start: 2013-08-22 — End: 2013-08-22
  Administered 2013-08-22: 60 mg via ORAL
  Filled 2013-08-22: qty 3

## 2013-08-22 MED ORDER — PREDNISONE 20 MG PO TABS: 40.0000 mg | ORAL_TABLET | Freq: Every day | ORAL | Status: DC

## 2013-08-22 MED ORDER — SALINE SPRAY 0.65 % NA SOLN
1.0000 | Freq: Once | NASAL | Status: AC
  Administered 2013-08-22: 1 via NASAL
  Filled 2013-08-22: qty 44

## 2013-08-22 MED ORDER — IPRATROPIUM BROMIDE 0.03 % NA SOLN
2.0000 | Freq: Once | NASAL | Status: AC
  Administered 2013-08-22: 2 via NASAL
  Filled 2013-08-22: qty 30

## 2013-08-22 NOTE — ED Notes (Signed)
Pt states he is having sinus problems  Pt states he saw his dr and is being treated with doxycycline and was given a shot of methylprednisolone  Pt states today he felt better so he did some yard work and worked out and tonight he states he is having difficulty breathing  Pt states he had a sleep study done Saturday night and gets the results of that tomorrow  Pt is short of breath in triage  Pt is blowing his nose  Pt states he had a CT and it showed inflammation of the sinuses

## 2013-08-22 NOTE — ED Provider Notes (Signed)
CSN: [id number]     Arrival date & time 08/22/13  1955 History   First MD Initiated Contact with Patient 08/22/13 2014     Chief Complaint  Patient presents with  . sinus issues    (Consider location/radiation/quality/duration/timing/severity/associated sxs/prior Treatment) HPI Comments: PAtient is being treated for sinus congestion with antibiotics and IM steroids by his PCP. He also has been referred to ENT but appointment is not until next week. Now CO SOB.  He reports that he stopped using his Nasonex 3 days ago because e he did not feel it was helping him NOw can not get any air through his nose.  Denies post nasal drip, difficulty swallowing   The history is provided by the patient.    Past Medical History  Diagnosis Date  . OA (osteoarthritis) of knee   . Colonic polyp   . GERD (gastroesophageal reflux disease)   . HERPES, GENITAL NOS   . ONYCHOMYCOSIS   . Neoplasm of uncertain behavior of skin   . SINUSITIS, ACUTE   . CONTACT DERMATITIS DUE TO SOLVENTS   . Actinic keratosis   . OSTEOARTHRITIS   . KNEE PAIN   . COLONIC POLYPS, HX OF   . HAND PAIN   . Well adult exam   . Preop exam for internal medicine   . Ingrowing toenail of left foot 07-25-12    is resolved now 07-25-12  . DOE (dyspnea on exertion) 07-25-12    related to eating shrimp   Past Surgical History  Procedure Laterality Date  . Knee arthroscopy      Left  . Umbilical hernia repair  07-25-12    10 yrs ago  . Total knee arthroplasty  07/31/2012    Procedure: TOTAL KNEE ARTHROPLASTY;  Surgeon: Eugenia Mcalpine, MD;  Location: [facility];  Service: Orthopedics;  Laterality: Left;  . Joint replacement  9/13    L TKR   Family History  Problem Relation Age of Onset  . Coronary artery disease Other   . Diabetes Mother   . Heart disease Father 35    CAD, MI, CHF   History  Substance Use Topics  . Smoking status: Current Some Day Smoker    Types: Cigars  . Smokeless tobacco: Not on file     Comment: occ.  cigar  . Alcohol Use: Yes     Comment: occ. socially    Review of Systems  Constitutional: Negative for fever.  HENT: Positive for congestion and sinus pressure. Negative for postnasal drip, rhinorrhea, sneezing, sore throat and trouble swallowing.   Respiratory: Positive for shortness of breath. Negative for cough and stridor.   Musculoskeletal: Negative for neck pain.  Skin: Negative for rash.  All other systems reviewed and are negative.    Allergies  Shrimp and Penicillins  Home Medications   Current Outpatient Rx  Name  Route  Sig  Dispense  Refill  . aspirin 81 MG tablet   Oral   Take 81 mg by mouth at bedtime.          . cholecalciferol (VITAMIN D) 400 UNITS TABS   Oral   Take 400 Units by mouth daily.         . cyanocobalamin 500 MCG tablet   Oral   Take 1,000 mcg by mouth daily.          [name] doxycycline (VIBRA-TABS) 100 MG tablet   Oral   Take 1 tablet (100 mg total) by mouth 2 (two) times daily.  20 tablet   0   . fish oil-omega-3 fatty acids 1000 MG capsule   Oral   Take 2 g by mouth daily.         [name] glucosamine-chondroitin 500-400 MG tablet   Oral   Take 1 tablet by mouth 3 (three) times daily.         . metoCLOPramide (REGLAN) 10 MG tablet   Oral   Take 10 mg by mouth 4 (four) times daily.          . Multiple Vitamin (MULTIVITAMIN) tablet   Oral   Take 1 tablet by mouth daily.         [name] NASONEX 50 MCG/ACT nasal spray      USE AS DIRECTED   17 g   3   . pantoprazole (PROTONIX) 40 MG tablet   Oral   Take 40 mg by mouth daily.           . Red Yeast Rice 600 MG CAPS   Oral   Take 1,200 capsules by mouth daily.         . traMADol (ULTRAM) 50 MG tablet   Oral   Take 50-100 mg by mouth 2 (two) times daily as needed. For pain          . valACYclovir (VALTREX) 500 MG tablet      TAKE 1 TABLET BY MOUTH EVERY DAY   90 tablet   2   . predniSONE (DELTASONE) 20 MG tablet   Oral   Take 2 tablets (40 mg total) by mouth  daily.   10 tablet   0    BP 137/80  Pulse 81  Temp(Src) 97.9 F (36.6 C) (Axillary)  Resp 22  SpO2 100% Physical Exam  Nursing note and vitals reviewed. Constitutional: He is oriented to person, place, and time. He appears well-developed and well-nourished.  HENT:  Head: Normocephalic.  Nose: Mucosal edema present. No sinus tenderness. Right sinus exhibits maxillary sinus tenderness and frontal sinus tenderness. Left sinus exhibits maxillary sinus tenderness and frontal sinus tenderness.  turbinates swollen nares both closed   Eyes: Pupils are equal, round, and reactive to light.  Cardiovascular: Normal rate.   Pulmonary/Chest: Effort normal. No respiratory distress. He has no wheezes. He exhibits no tenderness.  Musculoskeletal: Normal range of motion.  Neurological: He is alert and oriented to person, place, and time.  Skin: Skin is warm and dry. No rash noted.    ED Course  Procedures (including critical care time) Labs Review Labs Reviewed - No data to display Imaging Review No results found.  EKG Interpretation   None       MDM   1. Sinusitis     Will try Atrovent nasal spray  Patient has had a small amount of immediate relief.  Will discharge him home with the Atrovent nasal spray, Ocean nasal spray, as well as starting him on by mouth steroids.  He is to take these on a regular basis.  Followup with his ENT specialist as scheduled on Tuesday   Arman Filter, NP 08/22/13 2259

## 2013-08-22 NOTE — ED Notes (Signed)
Patient is alert and oriented x3.  He was given DC instructions and follow up visit instructions.  Patient gave verbal understanding.  He was DC ambulatory under his own power to home.  V/S stable.  He was not showing any signs of distress on DC 

## 2013-08-26 NOTE — ED Provider Notes (Signed)
Medical screening examination/treatment/procedure(s) were performed by non-physician practitioner and as supervising physician I was immediately available for consultation/collaboration.  Eshaan Titzer, MD 08/26/13 1725 

## 2013-09-23 ENCOUNTER — Other Ambulatory Visit: Payer: Self-pay | Admitting: Otolaryngology

## 2013-10-15 ENCOUNTER — Other Ambulatory Visit: Payer: Self-pay | Admitting: Internal Medicine

## 2013-11-03 ENCOUNTER — Other Ambulatory Visit: Payer: Self-pay | Admitting: Internal Medicine

## 2013-11-08 NOTE — Telephone Encounter (Signed)
Pt called to follow up on med request. Pt is going to be out before Monday and the refill request was sent two week ago. Please advise, pt is aware Dr. Macario Golds is not in the office.

## 2013-11-10 ENCOUNTER — Other Ambulatory Visit: Payer: Self-pay | Admitting: Internal Medicine

## 2013-11-11 NOTE — Telephone Encounter (Signed)
Ok to refill Last OV 10.8.14

## 2013-11-12 NOTE — Telephone Encounter (Signed)
The patient called and is hoping to get an update on his pain medication refill.   Callback [phone]

## 2013-11-13 NOTE — Telephone Encounter (Signed)
Please advise ok to Rf?

## 2013-11-13 NOTE — Telephone Encounter (Signed)
Ok per MD to refill. Done

## 2013-11-13 NOTE — Telephone Encounter (Signed)
Pt called in again. States he has called for a week.

## 2013-12-07 ENCOUNTER — Other Ambulatory Visit: Payer: Self-pay | Admitting: Internal Medicine

## 2013-12-16 ENCOUNTER — Other Ambulatory Visit (INDEPENDENT_AMBULATORY_CARE_PROVIDER_SITE_OTHER): Payer: BC Managed Care – PPO

## 2013-12-16 DIAGNOSIS — R739 Hyperglycemia, unspecified: Secondary | ICD-10-CM

## 2013-12-16 DIAGNOSIS — E785 Hyperlipidemia, unspecified: Secondary | ICD-10-CM

## 2013-12-16 DIAGNOSIS — R7309 Other abnormal glucose: Secondary | ICD-10-CM

## 2013-12-16 LAB — BASIC METABOLIC PANEL
BUN: 13 mg/dL (ref 6–23)
CO2: 26 mEq/L (ref 19–32)
CREATININE: 0.9 mg/dL (ref 0.4–1.5)
Calcium: 9.3 mg/dL (ref 8.4–10.5)
Chloride: 105 mEq/L (ref 96–112)
GFR: 93.75 mL/min (ref >60.00)
Glucose, Bld: 103 mg/dL — ABNORMAL HIGH (ref 70–99)
POTASSIUM: 4.2 meq/L (ref 3.5–5.1)
Sodium: 138 mEq/L (ref 135–145)

## 2013-12-16 LAB — HEPATIC FUNCTION PANEL
ALBUMIN: 4.3 g/dL (ref 3.5–5.2)
ALT: 36 U/L (ref 0–53)
AST: 26 U/L (ref 0–37)
Alkaline Phosphatase: 53 U/L (ref 39–117)
BILIRUBIN TOTAL: 0.7 mg/dL (ref 0.3–1.2)
Bilirubin, Direct: 0 mg/dL (ref 0.0–0.3)
Total Protein: 7.6 g/dL (ref 6.0–8.3)

## 2013-12-16 LAB — HEMOGLOBIN A1C: Hgb A1c MFr Bld: 5.6 % (ref 4.6–6.5)

## 2013-12-23 ENCOUNTER — Ambulatory Visit (INDEPENDENT_AMBULATORY_CARE_PROVIDER_SITE_OTHER): Payer: BC Managed Care – PPO | Admitting: Internal Medicine

## 2013-12-23 ENCOUNTER — Encounter: Payer: Self-pay | Admitting: Internal Medicine

## 2013-12-23 VITALS — BP 128/80 | HR 80 | Temp 98.6°F | Resp 16 | Wt 244.0 lb

## 2013-12-23 DIAGNOSIS — J329 Chronic sinusitis, unspecified: Secondary | ICD-10-CM

## 2013-12-23 DIAGNOSIS — M1990 Unspecified osteoarthritis, unspecified site: Secondary | ICD-10-CM

## 2013-12-23 DIAGNOSIS — I2510 Atherosclerotic heart disease of native coronary artery without angina pectoris: Secondary | ICD-10-CM

## 2013-12-23 DIAGNOSIS — E785 Hyperlipidemia, unspecified: Secondary | ICD-10-CM

## 2013-12-23 DIAGNOSIS — Z2911 Encounter for prophylactic immunotherapy for respiratory syncytial virus (RSV): Secondary | ICD-10-CM

## 2013-12-23 DIAGNOSIS — R0989 Other specified symptoms and signs involving the circulatory and respiratory systems: Secondary | ICD-10-CM

## 2013-12-23 DIAGNOSIS — M25569 Pain in unspecified knee: Secondary | ICD-10-CM

## 2013-12-23 DIAGNOSIS — Z23 Encounter for immunization: Secondary | ICD-10-CM

## 2013-12-23 DIAGNOSIS — R0609 Other forms of dyspnea: Secondary | ICD-10-CM

## 2013-12-23 MED ORDER — FLUTICASONE PROPIONATE 50 MCG/ACT NA SUSP: 1.0000 | Freq: Every day | NASAL | Status: DC

## 2013-12-23 MED ORDER — TRAMADOL HCL 50 MG PO TABS: ORAL_TABLET | ORAL | Status: DC

## 2013-12-23 NOTE — Assessment & Plan Note (Signed)
Loose wt 

## 2013-12-23 NOTE — Progress Notes (Signed)
Pre visit review using our clinic review tool, if applicable. No additional management support is needed unless otherwise documented below in the visit note. 

## 2013-12-23 NOTE — Assessment & Plan Note (Signed)
Continue with current prescription therapy as reflected on the Med list.  

## 2013-12-23 NOTE — Assessment & Plan Note (Signed)
Sinus congestion, SOB - better after surgery - nov 2014 2/15 exacerbation  Levaquin x 10 d

## 2013-12-23 NOTE — Assessment & Plan Note (Signed)
Resolved after sinus surgery.   

## 2013-12-23 NOTE — Progress Notes (Signed)
Subjective:    HPI  F/u sinus congestion, SOB - better after surgery - nov 2014 He had OSA during endoscopy; just had a sleep test  S/p L TKR 9/13 - still in pain - couldn't work - lost his job F/u L knee pain 5-6/10 on tramadol F/u  30 lbs wt gain - lost 13 lbs  Wt Readings from Last 3 Encounters:  12/23/13 244 lb (110.678 kg)  08/21/13 237 lb (107.502 kg)  08/12/13 241 lb (109.317 kg)   BP Readings from Last 3 Encounters:  12/23/13 128/80  08/22/13 137/80  08/21/13 130/80      Review of Systems  Constitutional: Positive for unexpected weight change. Negative for appetite change and fatigue.  HENT: Negative for congestion, nosebleeds, sneezing, sore throat and trouble swallowing.   Eyes: Negative for itching and visual disturbance.  Respiratory: Negative for cough.   Cardiovascular: Negative for chest pain, palpitations and leg swelling.  Gastrointestinal: Negative for nausea, diarrhea, blood in stool and abdominal distention.  Genitourinary: Negative for frequency and hematuria.  Musculoskeletal: Positive for arthralgias (knees) and gait problem. Negative for back pain, joint swelling and neck pain.  Skin: Negative for rash and wound.  Neurological: Negative for dizziness, tremors, speech difficulty and weakness.  Psychiatric/Behavioral: Negative for suicidal ideas, sleep disturbance, dysphoric mood and agitation. The patient is not nervous/anxious.        Objective:   Physical Exam  Constitutional: He is oriented to person, place, and time. He appears well-developed and well-nourished. No distress.  HENT:  Head: Normocephalic and atraumatic.  Right Ear: External ear normal.  Left Ear: External ear normal.  Nose: Nose normal.  Mouth/Throat: Oropharynx is clear and moist. No oropharyngeal exudate.  Eyes: Conjunctivae and EOM are normal. Pupils are equal, round, and reactive to light. Right eye exhibits no discharge. Left eye exhibits no discharge. No scleral  icterus.  Neck: Normal range of motion. Neck supple. No JVD present. No tracheal deviation present. No thyromegaly present.  Cardiovascular: Normal rate, regular rhythm, normal heart sounds and intact distal pulses.  Exam reveals no gallop and no friction rub.   No murmur heard. Pulmonary/Chest: Effort normal and breath sounds normal. No stridor. No respiratory distress. He has no wheezes. He has no rales. He exhibits no tenderness.  Abdominal: Soft. Bowel sounds are normal. He exhibits no distension and no mass. There is no tenderness. There is no rebound and no guarding.  Genitourinary: Rectum normal, prostate normal and penis normal. Guaiac negative stool. No penile tenderness.  Musculoskeletal: Normal range of motion. He exhibits no edema and no tenderness.  B knees are tender: L>>R L knee is swollen  Lymphadenopathy:    He has no cervical adenopathy.  Neurological: He is alert and oriented to person, place, and time. He has normal reflexes. No cranial nerve deficit. He exhibits normal muscle tone. Coordination normal.  Skin: Skin is warm and dry. No rash noted. He is not diaphoretic. No erythema. No pallor.  Psychiatric: He has a normal mood and affect. His behavior is normal. Judgment and thought content normal.  L knee is less swollen and tender; restr ROM Less swollen nasal mucosa Lab Results  Component Value Date   WBC 6.7 06/18/2013   HGB 14.9 06/18/2013   HCT 43.6 06/18/2013   PLT 228.0 06/18/2013   CHOL 223* 06/18/2013   TRIG 476.0 Triglyceride is over 400; calculations on Lipids are invalid.* 06/18/2013   HDL 34.30* 06/18/2013   LDLDIRECT 91.6 06/18/2013   ALT  36 12/16/2013   AST 26 12/16/2013   NA 138 12/16/2013   K 4.2 12/16/2013   CL 105 12/16/2013   CREATININE 0.9 12/16/2013   BUN 13 12/16/2013   CO2 26 12/16/2013   TSH 2.48 06/18/2013   PSA 0.74 06/18/2013   INR 0.91 07/25/2012   HGBA1C 5.6 12/16/2013        Assessment & Plan:

## 2014-06-23 ENCOUNTER — Encounter: Payer: Self-pay | Admitting: Internal Medicine

## 2014-06-23 ENCOUNTER — Ambulatory Visit (INDEPENDENT_AMBULATORY_CARE_PROVIDER_SITE_OTHER): Payer: BC Managed Care – PPO | Admitting: Internal Medicine

## 2014-06-23 VITALS — BP 126/85 | HR 70 | Ht 72.0 in | Wt 242.0 lb

## 2014-06-23 DIAGNOSIS — G4733 Obstructive sleep apnea (adult) (pediatric): Secondary | ICD-10-CM

## 2014-06-23 DIAGNOSIS — K2270 Barrett's esophagus without dysplasia: Secondary | ICD-10-CM

## 2014-06-23 DIAGNOSIS — Z0000 Encounter for general adult medical examination without abnormal findings: Secondary | ICD-10-CM

## 2014-06-23 DIAGNOSIS — M25569 Pain in unspecified knee: Secondary | ICD-10-CM

## 2014-06-23 DIAGNOSIS — I2510 Atherosclerotic heart disease of native coronary artery without angina pectoris: Secondary | ICD-10-CM

## 2014-06-23 MED ORDER — FLUTICASONE PROPIONATE 50 MCG/ACT NA SUSP: 1.0000 | Freq: Every day | NASAL | Status: DC

## 2014-06-23 MED ORDER — TRAMADOL HCL 50 MG PO TABS: ORAL_TABLET | ORAL | Status: DC

## 2014-06-23 MED ORDER — VALACYCLOVIR HCL 500 MG PO TABS: ORAL_TABLET | ORAL | Status: DC

## 2014-06-23 NOTE — Progress Notes (Signed)
Subjective:    HPI  The patient is here for a wellness exam. The patient has been doing well overall without major physical or psychological issues going on lately, except for chronic B knee pain S/p L TKR 9/13 - still in pain - on disability F/u L knee pain 5-6/10 on tramadol F/u 30 lbs wt gain  Wt Readings from Last 3 Encounters:  06/23/14 242 lb (109.77 kg)  12/23/13 244 lb (110.678 kg)  08/21/13 237 lb (107.502 kg)   BP Readings from Last 3 Encounters:  06/23/14 126/85  12/23/13 128/80  08/22/13 137/80      Review of Systems  Constitutional: Positive for unexpected weight change. Negative for appetite change and fatigue.  HENT: Negative for congestion, nosebleeds, sneezing, sore throat and trouble swallowing.   Eyes: Negative for itching and visual disturbance.  Respiratory: Negative for cough.   Cardiovascular: Negative for chest pain, palpitations and leg swelling.  Gastrointestinal: Negative for nausea, diarrhea, blood in stool and abdominal distention.  Genitourinary: Negative for frequency and hematuria.  Musculoskeletal: Positive for arthralgias (knees) and gait problem. Negative for back pain, joint swelling and neck pain.  Skin: Negative for rash and wound.  Neurological: Negative for dizziness, tremors, speech difficulty and weakness.  Psychiatric/Behavioral: Negative for suicidal ideas, sleep disturbance, dysphoric mood and agitation. The patient is not nervous/anxious.        Objective:   Physical Exam  Constitutional: He is oriented to person, place, and time. He appears well-developed and well-nourished. No distress.  HENT:  Head: Normocephalic and atraumatic.  Right Ear: External ear normal.  Left Ear: External ear normal.  Nose: Nose normal.  Mouth/Throat: Oropharynx is clear and moist. No oropharyngeal exudate.  Eyes: Conjunctivae and EOM are normal. Pupils are equal, round, and reactive to light. Right eye exhibits no discharge. Left eye  exhibits no discharge. No scleral icterus.  Neck: Normal range of motion. Neck supple. No JVD present. No tracheal deviation present. No thyromegaly present.  Cardiovascular: Normal rate, regular rhythm, normal heart sounds and intact distal pulses.  Exam reveals no gallop and no friction rub.   No murmur heard. Pulmonary/Chest: Effort normal and breath sounds normal. No stridor. No respiratory distress. He has no wheezes. He has no rales. He exhibits no tenderness.  Abdominal: Soft. Bowel sounds are normal. He exhibits no distension and no mass. There is no tenderness. There is no rebound and no guarding.  Genitourinary: Rectum normal, prostate normal and penis normal. Guaiac negative stool. No penile tenderness.  Musculoskeletal: Normal range of motion. He exhibits no edema and no tenderness.  B knees are tender: L>>R L knee is swollen  Lymphadenopathy:    He has no cervical adenopathy.  Neurological: He is alert and oriented to person, place, and time. He has normal reflexes. No cranial nerve deficit. He exhibits normal muscle tone. Coordination normal.  Skin: Skin is warm and dry. No rash noted. He is not diaphoretic. No erythema. No pallor.  Psychiatric: He has a normal mood and affect. His behavior is normal. Judgment and thought content normal.  L knee is less swollen and tender; restr ROM  Lab Results  Component Value Date   WBC 6.7 06/18/2013   HGB 14.9 06/18/2013   HCT 43.6 06/18/2013   PLT 228.0 06/18/2013   CHOL 223* 06/18/2013   TRIG 476.0 Triglyceride is over 400; calculations on Lipids are invalid.* 06/18/2013   HDL 34.30* 06/18/2013   LDLDIRECT 91.6 06/18/2013   ALT 36 12/16/2013  AST 26 12/16/2013   NA 138 12/16/2013   K 4.2 12/16/2013   CL 105 12/16/2013   CREATININE 0.9 12/16/2013   BUN 13 12/16/2013   CO2 26 12/16/2013   TSH 2.48 06/18/2013   PSA 0.74 06/18/2013   INR 0.91 07/25/2012   HGBA1C 5.6 12/16/2013       Assessment & Plan:

## 2014-06-23 NOTE — Assessment & Plan Note (Addendum)
We discussed age appropriate health related issues, including available/recomended screening tests and vaccinations. We discussed a need for adhering to healthy diet and exercise. Labs/EKG were reviewed/ordered. All questions were answered.  Wt Readings from Last 3 Encounters:  06/23/14 242 lb (109.77 kg)  12/23/13 244 lb (110.678 kg)  08/21/13 237 lb (107.502 kg)

## 2014-06-23 NOTE — Assessment & Plan Note (Signed)
no need for CPAP

## 2014-06-23 NOTE — Progress Notes (Signed)
Pre visit review using our clinic review tool, if applicable. No additional management support is needed unless otherwise documented below in the visit note. 

## 2014-06-23 NOTE — Assessment & Plan Note (Signed)
Continue with current prescription therapy as reflected on the Med list.  

## 2014-06-23 NOTE — Assessment & Plan Note (Signed)
L 9/14 - post-op swelling and pain long term - on disability Loose wt that you gained 30 lbs

## 2014-06-24 ENCOUNTER — Other Ambulatory Visit (INDEPENDENT_AMBULATORY_CARE_PROVIDER_SITE_OTHER): Payer: BC Managed Care – PPO

## 2014-06-24 DIAGNOSIS — Z0000 Encounter for general adult medical examination without abnormal findings: Secondary | ICD-10-CM

## 2014-06-24 LAB — BASIC METABOLIC PANEL
BUN: 11 mg/dL (ref 6–23)
CO2: 24 mEq/L (ref 19–32)
Calcium: 9.7 mg/dL (ref 8.4–10.5)
Chloride: 104 mEq/L (ref 96–112)
Creatinine, Ser: 0.9 mg/dL (ref 0.4–1.5)
GFR: 97.4 mL/min (ref >60.00)
Glucose, Bld: 109 mg/dL — ABNORMAL HIGH (ref 70–99)
Potassium: 4.3 mEq/L (ref 3.5–5.1)
SODIUM: 139 meq/L (ref 135–145)

## 2014-06-24 LAB — CBC WITH DIFFERENTIAL/PLATELET
BASOS ABS: 0 10*3/uL (ref 0.0–0.1)
Basophils Relative: 0.5 % (ref 0.0–3.0)
Eosinophils Absolute: 0.3 10*3/uL (ref 0.0–0.7)
Eosinophils Relative: 3.7 % (ref 0.0–5.0)
HEMATOCRIT: 45.3 % (ref 39.0–52.0)
Hemoglobin: 15.3 g/dL (ref 13.0–17.0)
LYMPHS ABS: 2.7 10*3/uL (ref 0.7–4.0)
Lymphocytes Relative: 39.1 % (ref 12.0–46.0)
MCHC: 33.7 g/dL (ref 30.0–36.0)
MCV: 94.8 fl (ref 78.0–100.0)
Monocytes Absolute: 0.8 10*3/uL (ref 0.1–1.0)
Monocytes Relative: 12.3 % — ABNORMAL HIGH (ref 3.0–12.0)
Neutro Abs: 3.1 10*3/uL (ref 1.4–7.7)
Neutrophils Relative %: 44.4 % (ref 43.0–77.0)
PLATELETS: 262 10*3/uL (ref 150.0–400.0)
RBC: 4.78 Mil/uL (ref 4.22–5.81)
RDW: 13.7 % (ref 11.5–15.5)
WBC: 6.9 10*3/uL (ref 4.0–10.5)

## 2014-06-24 LAB — URINALYSIS
Bilirubin Urine: NEGATIVE
Hgb urine dipstick: NEGATIVE
Ketones, ur: NEGATIVE
LEUKOCYTES UA: NEGATIVE
Nitrite: NEGATIVE
PH: 7 (ref 5.0–8.0)
TOTAL PROTEIN, URINE-UPE24: NEGATIVE
Urine Glucose: NEGATIVE
Urobilinogen, UA: 0.2 (ref 0.0–1.0)

## 2014-06-24 LAB — HEPATIC FUNCTION PANEL
ALBUMIN: 4.2 g/dL (ref 3.5–5.2)
ALT: 36 U/L (ref 0–53)
AST: 25 U/L (ref 0–37)
Alkaline Phosphatase: 58 U/L (ref 39–117)
Bilirubin, Direct: 0.1 mg/dL (ref 0.0–0.3)
TOTAL PROTEIN: 7.3 g/dL (ref 6.0–8.3)
Total Bilirubin: 1.1 mg/dL (ref 0.2–1.2)

## 2014-06-24 LAB — LIPID PANEL
CHOLESTEROL: 231 mg/dL — AB (ref 0–200)
HDL: 34.9 mg/dL — ABNORMAL LOW (ref >39.00)
NonHDL: 196.1
TRIGLYCERIDES: 344 mg/dL — AB (ref 0.0–149.0)
Total CHOL/HDL Ratio: 7
VLDL: 68.8 mg/dL — ABNORMAL HIGH (ref 0.0–40.0)

## 2014-06-24 LAB — TSH: TSH: 3.58 u[IU]/mL (ref 0.35–4.50)

## 2014-06-24 LAB — LDL CHOLESTEROL, DIRECT: LDL DIRECT: 127.8 mg/dL

## 2014-06-24 LAB — PSA: PSA: 0.74 ng/mL (ref 0.10–4.00)

## 2014-06-28 ENCOUNTER — Other Ambulatory Visit: Payer: Self-pay | Admitting: Internal Medicine

## 2014-06-28 MED ORDER — ATORVASTATIN CALCIUM 10 MG PO TABS: 10.0000 mg | ORAL_TABLET | Freq: Every day | ORAL | Status: DC

## 2014-07-01 ENCOUNTER — Other Ambulatory Visit: Payer: Self-pay | Admitting: *Deleted

## 2014-07-01 DIAGNOSIS — E785 Hyperlipidemia, unspecified: Secondary | ICD-10-CM

## 2014-07-23 ENCOUNTER — Telehealth: Payer: Self-pay | Admitting: Internal Medicine

## 2014-07-23 MED ORDER — MELOXICAM 7.5 MG PO TABS: 7.5000 mg | ORAL_TABLET | Freq: Every day | ORAL | Status: DC

## 2014-07-23 NOTE — Telephone Encounter (Signed)
Patient use to be on Celebrex but was too expensive.  Orthopedic surgeon put him on meloxicam.  He is currently out of meloxicam and request Dr. Alain Marion to call him in a script or something similar.  Patient uses [facility] in [place].

## 2014-07-23 NOTE — Telephone Encounter (Signed)
Ok Thx 

## 2014-07-24 NOTE — Telephone Encounter (Signed)
Pt informed

## 2014-09-29 ENCOUNTER — Other Ambulatory Visit (INDEPENDENT_AMBULATORY_CARE_PROVIDER_SITE_OTHER): Payer: BC Managed Care – PPO

## 2014-09-29 DIAGNOSIS — E785 Hyperlipidemia, unspecified: Secondary | ICD-10-CM

## 2014-09-29 LAB — LIPID PANEL
CHOL/HDL RATIO: 6
Cholesterol: 153 mg/dL (ref 0–200)
HDL: 27.2 mg/dL — ABNORMAL LOW (ref >39.00)
NonHDL: 125.8
TRIGLYCERIDES: 258 mg/dL — AB (ref 0.0–149.0)
VLDL: 51.6 mg/dL — ABNORMAL HIGH (ref 0.0–40.0)

## 2014-09-29 LAB — HEPATIC FUNCTION PANEL
ALK PHOS: 54 U/L (ref 39–117)
ALT: 32 U/L (ref 0–53)
AST: 27 U/L (ref 0–37)
Albumin: 4.3 g/dL (ref 3.5–5.2)
Bilirubin, Direct: 0.1 mg/dL (ref 0.0–0.3)
Total Bilirubin: 0.9 mg/dL (ref 0.2–1.2)
Total Protein: 7.3 g/dL (ref 6.0–8.3)

## 2014-09-29 LAB — LDL CHOLESTEROL, DIRECT: Direct LDL: 79 mg/dL

## 2014-10-03 ENCOUNTER — Ambulatory Visit (INDEPENDENT_AMBULATORY_CARE_PROVIDER_SITE_OTHER): Payer: BC Managed Care – PPO | Admitting: Internal Medicine

## 2014-10-03 ENCOUNTER — Encounter: Payer: Self-pay | Admitting: Internal Medicine

## 2014-10-03 VITALS — BP 128/84 | HR 67 | Temp 98.4°F | Resp 98 | Wt 241.0 lb

## 2014-10-03 DIAGNOSIS — I2583 Coronary atherosclerosis due to lipid rich plaque: Secondary | ICD-10-CM

## 2014-10-03 DIAGNOSIS — M25569 Pain in unspecified knee: Secondary | ICD-10-CM

## 2014-10-03 DIAGNOSIS — E785 Hyperlipidemia, unspecified: Secondary | ICD-10-CM

## 2014-10-03 DIAGNOSIS — I2510 Atherosclerotic heart disease of native coronary artery without angina pectoris: Secondary | ICD-10-CM

## 2014-10-03 MED ORDER — MELOXICAM 7.5 MG PO TABS: 7.5000 mg | ORAL_TABLET | Freq: Every day | ORAL | Status: DC

## 2014-10-03 MED ORDER — TRAMADOL HCL 50 MG PO TABS: ORAL_TABLET | ORAL | Status: DC

## 2014-10-03 NOTE — Assessment & Plan Note (Signed)
Discussed.

## 2014-10-03 NOTE — Progress Notes (Signed)
   Subjective:    HPI  C/o chronic B knee pain S/p L TKR 9/13 - still in pain - on disability F/u L knee pain 5-6/10 on tramadol F/u 30 lbs wt gain  Wt Readings from Last 3 Encounters:  10/03/14 241 lb (109.317 kg)  06/23/14 242 lb (109.77 kg)  12/23/13 244 lb (110.678 kg)   BP Readings from Last 3 Encounters:  10/03/14 128/84  06/23/14 126/85  12/23/13 128/80      Review of Systems  Constitutional: Positive for unexpected weight change. Negative for appetite change and fatigue.  HENT: Negative for congestion, nosebleeds, sneezing, sore throat and trouble swallowing.   Eyes: Negative for itching and visual disturbance.  Respiratory: Negative for cough.   Cardiovascular: Negative for chest pain, palpitations and leg swelling.  Gastrointestinal: Negative for nausea, diarrhea, blood in stool and abdominal distention.  Genitourinary: Negative for frequency and hematuria.  Musculoskeletal: Positive for arthralgias (knees) and gait problem. Negative for back pain, joint swelling and neck pain.  Skin: Negative for rash and wound.  Neurological: Negative for dizziness, tremors, speech difficulty and weakness.  Psychiatric/Behavioral: Negative for suicidal ideas, sleep disturbance, dysphoric mood and agitation. The patient is not nervous/anxious.        Objective:   Physical Exam  Constitutional: He is oriented to person, place, and time. He appears well-developed. No distress.  NAD  HENT:  Mouth/Throat: Oropharynx is clear and moist.  Eyes: Conjunctivae are normal. Pupils are equal, round, and reactive to light.  Neck: Normal range of motion. No JVD present. No thyromegaly present.  Cardiovascular: Normal rate, regular rhythm, normal heart sounds and intact distal pulses.  Exam reveals no gallop and no friction rub.   No murmur heard. Pulmonary/Chest: Effort normal and breath sounds normal. No respiratory distress. He has no wheezes. He has no rales. He exhibits no  tenderness.  Abdominal: Soft. Bowel sounds are normal. He exhibits no distension and no mass. There is no tenderness. There is no rebound and no guarding.  Musculoskeletal: Normal range of motion. He exhibits no edema or tenderness.  Lymphadenopathy:    He has no cervical adenopathy.  Neurological: He is alert and oriented to person, place, and time. He has normal reflexes. No cranial nerve deficit. He exhibits normal muscle tone. He displays a negative Romberg sign. Coordination and gait normal.  No meningeal signs  Skin: Skin is warm and dry. No rash noted.  Psychiatric: He has a normal mood and affect. His behavior is normal. Judgment and thought content normal.  L knee is less swollen and tender; restr ROM Obese  Lab Results  Component Value Date   WBC 6.9 06/24/2014   HGB 15.3 06/24/2014   HCT 45.3 06/24/2014   PLT 262.0 06/24/2014   CHOL 153 09/29/2014   TRIG 258.0* 09/29/2014   HDL 27.20* 09/29/2014   LDLDIRECT 79.0 09/29/2014   ALT 32 09/29/2014   AST 27 09/29/2014   NA 139 06/24/2014   K 4.3 06/24/2014   CL 104 06/24/2014   CREATININE 0.9 06/24/2014   BUN 11 06/24/2014   CO2 24 06/24/2014   TSH 3.58 06/24/2014   PSA 0.74 06/24/2014   INR 0.91 07/25/2012   HGBA1C 5.6 12/16/2013       Assessment & Plan:

## 2014-10-03 NOTE — Assessment & Plan Note (Signed)
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 addiction risk, apnea etc) and complications (i.e. Somnolence, constipation and others) were explained to the patient and were aknowledged.  L 9/14 - post-op swelling and pain long term - on disability Loose wt that you gained 30 lbs   Continue with current prescription therapy as reflected on the Med list.

## 2014-10-03 NOTE — Progress Notes (Signed)
Pre visit review using our clinic review tool, if applicable. No additional management support is needed unless otherwise documented below in the visit note. 

## 2014-10-03 NOTE — Assessment & Plan Note (Signed)
Continue with current prescription therapy as reflected on the Med list.  

## 2014-10-03 NOTE — Patient Instructions (Signed)
Low carb diet 

## 2014-10-07 ENCOUNTER — Telehealth: Payer: Self-pay | Admitting: Internal Medicine

## 2014-10-07 NOTE — Telephone Encounter (Signed)
emmi mailed  °

## 2014-10-21 ENCOUNTER — Telehealth: Payer: Self-pay | Admitting: Internal Medicine

## 2014-10-21 NOTE — Telephone Encounter (Signed)
Pt stopped taking cholesterol meds for about 2 weeks, pt did resume however. He noticed pain was less and  breathing was better. Now he is experiencing a difference in the right eye if feels like their is a little "shadow" in front of it. Are these symptoms related to going off cholesterol meds for 2 weeks and then back on? Pls advise. Pls let pt know what to do.  [phone]

## 2014-10-22 NOTE — Telephone Encounter (Signed)
Eye sx's are not related. Pls see your eye Dr Corrin Parker

## 2014-10-23 NOTE — Telephone Encounter (Signed)
Notified pt with md response.../lmb 

## 2014-11-24 ENCOUNTER — Telehealth: Payer: Self-pay | Admitting: Internal Medicine

## 2014-11-24 NOTE — Telephone Encounter (Signed)
Pt called in said that he stop taking the cholesterol meds and he feels so much better.  He said that med makes him feel so bad.  It there anything else he can take for his cholesterol that want make him feel so bad?    Cell number [phone]

## 2014-11-25 NOTE — Telephone Encounter (Signed)
Low carb diet Can try CholestOFF (OTC) and Krill oil (MegaRed) Thx

## 2014-11-26 NOTE — Telephone Encounter (Signed)
Notified pt with md response.../lmb 

## 2014-12-24 ENCOUNTER — Ambulatory Visit: Payer: BC Managed Care – PPO | Admitting: Internal Medicine

## 2015-02-03 ENCOUNTER — Encounter: Payer: Self-pay | Admitting: Internal Medicine

## 2015-02-03 ENCOUNTER — Ambulatory Visit (INDEPENDENT_AMBULATORY_CARE_PROVIDER_SITE_OTHER): Payer: Medicare Other | Admitting: Internal Medicine

## 2015-02-03 VITALS — BP 140/74 | HR 84 | Wt 243.0 lb

## 2015-02-03 DIAGNOSIS — Z0000 Encounter for general adult medical examination without abnormal findings: Secondary | ICD-10-CM | POA: Diagnosis not present

## 2015-02-03 DIAGNOSIS — M159 Polyosteoarthritis, unspecified: Secondary | ICD-10-CM

## 2015-02-03 DIAGNOSIS — N320 Bladder-neck obstruction: Secondary | ICD-10-CM

## 2015-02-03 DIAGNOSIS — M150 Primary generalized (osteo)arthritis: Secondary | ICD-10-CM

## 2015-02-03 HISTORY — DX: Bladder-neck obstruction: N32.0

## 2015-02-03 MED ORDER — TERAZOSIN HCL 2 MG PO CAPS: 2.0000 mg | ORAL_CAPSULE | Freq: Every day | ORAL | Status: DC

## 2015-02-03 MED ORDER — TRAMADOL HCL 50 MG PO TABS: ORAL_TABLET | ORAL | Status: DC

## 2015-02-03 NOTE — Progress Notes (Signed)
   Subjective:    HPI  C/o chronic B knee pain S/p L TKR 9/13 - still in pain - on disability F/u L knee pain 5-6/10 on tramadol F/u 30 lbs wt gain  Wt Readings from Last 3 Encounters:  02/03/15 243 lb (110.224 kg)  10/03/14 241 lb (109.317 kg)  06/23/14 242 lb (109.77 kg)   BP Readings from Last 3 Encounters:  02/03/15 140/74  10/03/14 128/84  06/23/14 126/85      Review of Systems  Constitutional: Positive for unexpected weight change. Negative for appetite change and fatigue.  HENT: Negative for congestion, nosebleeds, sneezing, sore throat and trouble swallowing.   Eyes: Negative for itching and visual disturbance.  Respiratory: Negative for cough.   Cardiovascular: Negative for chest pain, palpitations and leg swelling.  Gastrointestinal: Negative for nausea, diarrhea, blood in stool and abdominal distention.  Genitourinary: Negative for frequency and hematuria.  Musculoskeletal: Positive for arthralgias (knees) and gait problem. Negative for back pain, joint swelling and neck pain.  Skin: Negative for rash and wound.  Neurological: Negative for dizziness, tremors, speech difficulty and weakness.  Psychiatric/Behavioral: Negative for suicidal ideas, sleep disturbance, dysphoric mood and agitation. The patient is not nervous/anxious.        Objective:   Physical Exam  Constitutional: He is oriented to person, place, and time. He appears well-developed. No distress.  NAD  HENT:  Mouth/Throat: Oropharynx is clear and moist.  Eyes: Conjunctivae are normal. Pupils are equal, round, and reactive to light.  Neck: Normal range of motion. No JVD present. No thyromegaly present.  Cardiovascular: Normal rate, regular rhythm, normal heart sounds and intact distal pulses.  Exam reveals no gallop and no friction rub.   No murmur heard. Pulmonary/Chest: Effort normal and breath sounds normal. No respiratory distress. He has no wheezes. He has no rales. He exhibits no  tenderness.  Abdominal: Soft. Bowel sounds are normal. He exhibits no distension and no mass. There is no tenderness. There is no rebound and no guarding.  Musculoskeletal: Normal range of motion. He exhibits no edema or tenderness.  Lymphadenopathy:    He has no cervical adenopathy.  Neurological: He is alert and oriented to person, place, and time. He has normal reflexes. No cranial nerve deficit. He exhibits normal muscle tone. He displays a negative Romberg sign. Coordination and gait normal.  No meningeal signs  Skin: Skin is warm and dry. No rash noted.  Psychiatric: He has a normal mood and affect. His behavior is normal. Judgment and thought content normal.  L knee is less swollen and tender; restr ROM Obese  Lab Results  Component Value Date   WBC 6.9 06/24/2014   HGB 15.3 06/24/2014   HCT 45.3 06/24/2014   PLT 262.0 06/24/2014   CHOL 153 09/29/2014   TRIG 258.0* 09/29/2014   HDL 27.20* 09/29/2014   LDLDIRECT 79.0 09/29/2014   ALT 32 09/29/2014   AST 27 09/29/2014   NA 139 06/24/2014   K 4.3 06/24/2014   CL 104 06/24/2014   CREATININE 0.9 06/24/2014   BUN 11 06/24/2014   CO2 24 06/24/2014   TSH 3.58 06/24/2014   PSA 0.74 06/24/2014   INR 0.91 07/25/2012   HGBA1C 5.6 12/16/2013       Assessment & Plan:

## 2015-02-03 NOTE — Assessment & Plan Note (Signed)
Will try Hytrin

## 2015-02-03 NOTE — Progress Notes (Signed)
Pre visit review using our clinic review tool, if applicable. No additional management support is needed unless otherwise documented below in the visit note. 

## 2015-02-03 NOTE — Assessment & Plan Note (Signed)
Tramadol prn 

## 2015-02-09 ENCOUNTER — Other Ambulatory Visit (HOSPITAL_COMMUNITY): Payer: Self-pay | Admitting: Orthopedic Surgery

## 2015-02-09 DIAGNOSIS — M2342 Loose body in knee, left knee: Secondary | ICD-10-CM

## 2015-02-20 ENCOUNTER — Encounter (HOSPITAL_COMMUNITY)
Admission: RE | Admit: 2015-02-20 | Discharge: 2015-02-20 | Disposition: A | Discharge status: Home / Self Care | Payer: Medicare Other | Source: Ambulatory Visit | Attending: Orthopedic Surgery | Admitting: Orthopedic Surgery

## 2015-02-20 DIAGNOSIS — Z96652 Presence of left artificial knee joint: Secondary | ICD-10-CM | POA: Diagnosis not present

## 2015-02-20 DIAGNOSIS — M25562 Pain in left knee: Secondary | ICD-10-CM | POA: Diagnosis not present

## 2015-02-20 DIAGNOSIS — M2342 Loose body in knee, left knee: Secondary | ICD-10-CM | POA: Insufficient documentation

## 2015-02-20 DIAGNOSIS — M25462 Effusion, left knee: Secondary | ICD-10-CM | POA: Diagnosis not present

## 2015-02-23 ENCOUNTER — Other Ambulatory Visit: Payer: Self-pay | Admitting: *Deleted

## 2015-02-23 MED ORDER — MELOXICAM 7.5 MG PO TABS: 7.5000 mg | ORAL_TABLET | Freq: Every day | ORAL | Status: DC

## 2015-03-05 DIAGNOSIS — T8484XA Pain due to internal orthopedic prosthetic devices, implants and grafts, initial encounter: Secondary | ICD-10-CM | POA: Diagnosis not present

## 2015-04-09 DIAGNOSIS — Z96652 Presence of left artificial knee joint: Secondary | ICD-10-CM | POA: Diagnosis not present

## 2015-04-09 DIAGNOSIS — Z471 Aftercare following joint replacement surgery: Secondary | ICD-10-CM | POA: Diagnosis not present

## 2015-04-21 DIAGNOSIS — K2270 Barrett's esophagus without dysplasia: Secondary | ICD-10-CM | POA: Diagnosis not present

## 2015-04-21 DIAGNOSIS — K219 Gastro-esophageal reflux disease without esophagitis: Secondary | ICD-10-CM | POA: Diagnosis not present

## 2015-04-21 DIAGNOSIS — Z1211 Encounter for screening for malignant neoplasm of colon: Secondary | ICD-10-CM | POA: Diagnosis not present

## 2015-05-08 DIAGNOSIS — Z01818 Encounter for other preprocedural examination: Secondary | ICD-10-CM | POA: Diagnosis not present

## 2015-05-08 DIAGNOSIS — Z1211 Encounter for screening for malignant neoplasm of colon: Secondary | ICD-10-CM | POA: Diagnosis not present

## 2015-05-13 ENCOUNTER — Other Ambulatory Visit: Payer: Self-pay | Admitting: Internal Medicine

## 2015-05-14 DIAGNOSIS — K635 Polyp of colon: Secondary | ICD-10-CM | POA: Diagnosis not present

## 2015-06-09 ENCOUNTER — Ambulatory Visit (INDEPENDENT_AMBULATORY_CARE_PROVIDER_SITE_OTHER): Payer: Medicare Other | Admitting: Internal Medicine

## 2015-06-09 ENCOUNTER — Encounter: Payer: Self-pay | Admitting: Internal Medicine

## 2015-06-09 VITALS — BP 130/88 | HR 83 | Ht 72.0 in | Wt 249.0 lb

## 2015-06-09 DIAGNOSIS — N508 Other specified disorders of male genital organs: Secondary | ICD-10-CM | POA: Diagnosis not present

## 2015-06-09 DIAGNOSIS — M545 Low back pain, unspecified: Secondary | ICD-10-CM

## 2015-06-09 DIAGNOSIS — R5382 Chronic fatigue, unspecified: Secondary | ICD-10-CM

## 2015-06-09 DIAGNOSIS — N5089 Other specified disorders of the male genital organs: Secondary | ICD-10-CM | POA: Insufficient documentation

## 2015-06-09 DIAGNOSIS — R5383 Other fatigue: Secondary | ICD-10-CM

## 2015-06-09 DIAGNOSIS — E785 Hyperlipidemia, unspecified: Secondary | ICD-10-CM

## 2015-06-09 DIAGNOSIS — R202 Paresthesia of skin: Secondary | ICD-10-CM

## 2015-06-09 DIAGNOSIS — R739 Hyperglycemia, unspecified: Secondary | ICD-10-CM | POA: Diagnosis not present

## 2015-06-09 DIAGNOSIS — E559 Vitamin D deficiency, unspecified: Secondary | ICD-10-CM

## 2015-06-09 DIAGNOSIS — R011 Cardiac murmur, unspecified: Secondary | ICD-10-CM | POA: Diagnosis not present

## 2015-06-09 DIAGNOSIS — N320 Bladder-neck obstruction: Secondary | ICD-10-CM

## 2015-06-09 DIAGNOSIS — Z0000 Encounter for general adult medical examination without abnormal findings: Secondary | ICD-10-CM | POA: Diagnosis not present

## 2015-06-09 DIAGNOSIS — M5440 Lumbago with sciatica, unspecified side: Secondary | ICD-10-CM

## 2015-06-09 HISTORY — DX: Other fatigue: R53.83

## 2015-06-09 HISTORY — DX: Low back pain, unspecified: M54.50

## 2015-06-09 HISTORY — DX: Hyperglycemia, unspecified: R73.9

## 2015-06-09 HISTORY — DX: Other specified disorders of the male genital organs: N50.89

## 2015-06-09 MED ORDER — TRAMADOL HCL 50 MG PO TABS: ORAL_TABLET | ORAL | Status: DC

## 2015-06-09 NOTE — Assessment & Plan Note (Signed)
2016 no OSA Labs ECHO Loose wt

## 2015-06-09 NOTE — Assessment & Plan Note (Signed)
L side ?varicocele 2016 [place]

## 2015-06-09 NOTE — Assessment & Plan Note (Signed)
A1c Wt loss 

## 2015-06-09 NOTE — Progress Notes (Signed)
Subjective:  Patient ID: [name], male    DOB: 27-Apr-1951  Age: 64 y.o. MRN: [id number]  CC: No chief complaint on file.   HPI ONA SEDGWICK presents for a well exam. C/o knee pain, swelling. C/o LBP. B legs hurt - Dr Charlann Boxer. Was given Lyrica - too $$$. C/o L testis pain and swelling. C/o wt gain.  Outpatient Prescriptions Prior to Visit  Medication Sig Dispense Refill  . aspirin 81 MG tablet Take 81 mg by mouth at bedtime.     . cholecalciferol (VITAMIN D) 400 UNITS TABS Take 400 Units by mouth daily.    . cyanocobalamin 500 MCG tablet Take 1,000 mcg by mouth daily.     . fish oil-omega-3 fatty acids 1000 MG capsule Take 2 g by mouth daily.    . fluticasone (FLONASE) 50 MCG/ACT nasal spray Place 1 spray into both nostrils daily. 16 g 11  . glucosamine-chondroitin 500-400 MG tablet Take 1 tablet by mouth 3 (three) times daily.    . meloxicam (MOBIC) 7.5 MG tablet Take 1-2 tablets (7.5-15 mg total) by mouth daily. 60 tablet 5  . metoCLOPramide (REGLAN) 10 MG tablet Take 10 mg by mouth 4 (four) times daily.    . Multiple Vitamin (MULTIVITAMIN) tablet Take 1 tablet by mouth daily.    . pantoprazole (PROTONIX) 40 MG tablet Take 40 mg by mouth daily.      . traMADol (ULTRAM) 50 MG tablet TAKE 1 TO 2 TABLETS TWICE A DAY AS NEEDED FOR PAIN 120 tablet 3  . valACYclovir (VALTREX) 500 MG tablet TAKE ONE TABLET BY MOUTH ONCE DAILY 90 tablet 0  . terazosin (HYTRIN) 2 MG capsule Take 1 capsule (2 mg total) by mouth at bedtime. (Patient not taking: Reported on 06/09/2015) 30 capsule 11   Facility-Administered Medications Prior to Visit  Medication Dose Route Frequency Provider Last Rate Last Dose  . methylPREDNISolone acetate (DEPO-MEDROL) injection 40 mg  40 mg Intra-articular Once Pinkney Venard V, MD        ROS Review of Systems  Constitutional: Positive for fatigue and unexpected weight change. Negative for appetite change.  HENT: Negative for congestion, nosebleeds, sneezing, sore  throat and trouble swallowing.   Eyes: Negative for itching and visual disturbance.  Respiratory: Negative for cough.   Cardiovascular: Negative for chest pain, palpitations and leg swelling.  Gastrointestinal: Negative for nausea, diarrhea, blood in stool and abdominal distention.  Genitourinary: Positive for scrotal swelling. Negative for urgency, frequency and hematuria.  Musculoskeletal: Positive for back pain, arthralgias, gait problem and neck pain. Negative for joint swelling and neck stiffness.  Skin: Negative for rash.  Neurological: Negative for dizziness, tremors, speech difficulty, weakness, light-headedness and headaches.  Psychiatric/Behavioral: Negative for suicidal ideas, sleep disturbance, dysphoric mood and agitation. The patient is not nervous/anxious.     Objective:  BP 130/88 mmHg  Pulse 83  Ht 6' (1.829 m)  Wt 249 lb (112.946 kg)  BMI 33.76 kg/m2  SpO2 94%  BP Readings from Last 3 Encounters:  06/09/15 130/88  02/03/15 140/74  10/03/14 128/84    Wt Readings from Last 3 Encounters:  06/09/15 249 lb (112.946 kg)  02/03/15 243 lb (110.224 kg)  10/03/14 241 lb (109.317 kg)    Physical Exam  Constitutional: He is oriented to person, place, and time. He appears well-developed. No distress.  Obese  HENT:  Head: Normocephalic and atraumatic.  Right Ear: External ear normal.  Left Ear: External ear normal.  Nose: Nose normal.  Mouth/Throat: Oropharynx is clear and moist. No oropharyngeal exudate.  Eyes: Conjunctivae and EOM are normal. Pupils are equal, round, and reactive to light. Right eye exhibits no discharge. Left eye exhibits no discharge. No scleral icterus.  Neck: Normal range of motion. Neck supple. No JVD present. No tracheal deviation present. No thyromegaly present.  Cardiovascular: Normal rate, regular rhythm and intact distal pulses.  Exam reveals no gallop and no friction rub.   Murmur heard. 2/6  Pulmonary/Chest: Effort normal and breath  sounds normal. No stridor. No respiratory distress. He has no wheezes. He has no rales. He exhibits no tenderness.  Abdominal: Soft. Bowel sounds are normal. He exhibits no distension and no mass. There is no tenderness. There is no rebound and no guarding.  Genitourinary: Rectum normal, prostate normal and penis normal. Guaiac negative stool. No penile tenderness.  Musculoskeletal: Normal range of motion. He exhibits no edema or tenderness.  Lymphadenopathy:    He has no cervical adenopathy.  Neurological: He is alert and oriented to person, place, and time. He has normal reflexes. No cranial nerve deficit. He exhibits normal muscle tone. Coordination normal.  Skin: Skin is warm and dry. No rash noted. He is not diaphoretic. No erythema. No pallor.  Psychiatric: He has a normal mood and affect. His behavior is normal. Judgment and thought content normal.  varicose veins above L testis. No mass Prostate WNL  B knees - w/pain  Lab Results  Component Value Date   WBC 6.9 06/24/2014   HGB 15.3 06/24/2014   HCT 45.3 06/24/2014   PLT 262.0 06/24/2014   GLUCOSE 109* 06/24/2014   CHOL 153 09/29/2014   TRIG 258.0* 09/29/2014   HDL 27.20* 09/29/2014   LDLDIRECT 79.0 09/29/2014   LDLCALC 137* 05/02/2008   ALT 32 09/29/2014   AST 27 09/29/2014   NA 139 06/24/2014   K 4.3 06/24/2014   CL 104 06/24/2014   CREATININE 0.9 06/24/2014   BUN 11 06/24/2014   CO2 24 06/24/2014   TSH 3.58 06/24/2014   PSA 0.74 06/24/2014   INR 0.91 07/25/2012   HGBA1C 5.6 12/16/2013    Nm Bone Scan 3 Phase Lower Extremity  02/20/2015   CLINICAL DATA:  Left total knee arthroplasty.  Pain and swelling.  EXAM: NUCLEAR MEDICINE 3-PHASE BONE SCAN  TECHNIQUE: Radionuclide angiographic images, immediate static blood pool images, and 3-hour delayed static images were obtained of the knees after intravenous injection of radiopharmaceutical.  RADIOPHARMACEUTICALS:  25.0 Technetium 99 MDP  COMPARISON:  None.  FINDINGS:  Vascular phase: Relative symmetric radiotracer activity to both knees identified.  Blood pool phase: Very mild asymmetric increased radiotracer uptake surrounding the left knee arthroplasty device is noted. There is a more focal area of intense uptake localizing to the anterior aspect of the lateral tibial plateau of the left knee.  Delayed phase: Mild generalized increased radiotracer uptake surrounds the left knee arthroplasty device. There is a more focal area of intense uptake localizing to the anterior aspect of the lateral tibial plateau of the left knee.  IMPRESSION: 1. No specific features identified to suggest infection or loosening. 2. Nonspecific activity is identified on the blood pool and delayed phase images particularly in the distribution of the lateral tibial plateau, anteriorly.   Electronically Signed   By: Signa Kell M.D.   On: 02/20/2015 13:43    Assessment & Plan:   There are no diagnoses linked to this encounter. I have discontinued Mr. Statt terazosin. I am also having him maintain  his aspirin, pantoprazole, cholecalciferol, cyanocobalamin, fish oil-omega-3 fatty acids, glucosamine-chondroitin, multivitamin, fluticasone, metoCLOPramide, traMADol, meloxicam, valACYclovir, and metoCLOPramide. We will continue to administer methylPREDNISolone acetate.  Meds ordered this encounter  Medications  . metoCLOPramide (REGLAN) 5 MG tablet    Sig: Take 1 tablet by mouth 3 (three) times daily.     Follow-up: No Follow-up on file.  Sonda Primes, MD

## 2015-06-09 NOTE — Assessment & Plan Note (Signed)
PSA

## 2015-06-09 NOTE — Progress Notes (Signed)
Pre visit review using our clinic review tool, if applicable. No additional management support is needed unless otherwise documented below in the visit note. 

## 2015-06-09 NOTE — Patient Instructions (Signed)

## 2015-06-09 NOTE — Assessment & Plan Note (Addendum)
Here for medicare wellness/physical  Diet: heart healthy  Physical activity: not sedentary  Depression/mood screen: negative  Hearing: intact to whispered voice  Visual acuity: grossly normal, performs annual eye exam  ADLs: capable  Fall risk: mild Home safety: good  Cognitive evaluation: intact to orientation, naming, recall and repetition  EOL planning: adv directives, full code/ I agree  I have personally reviewed and have noted  1. The patient's medical, surgical and social history  2. Their use of alcohol, tobacco or illicit drugs  3. Their current medications and supplements  4. The patient's functional ability including ADL's, fall risks, home safety risks and hearing or visual impairment.  5. Diet and physical activities  6. Evidence for depression or mood disorders 7. The roster of all physicians providing medical care to patient - is listed in the Snapshot section of the chart and reviewed today.    Today patient counseled on age appropriate routine health concerns for screening and prevention, each reviewed and up to date or declined. Immunizations reviewed and up to date or declined. Labs ordered and reviewed. Risk factors for depression reviewed and negative. Hearing function and visual acuity are intact. ADLs screened and addressed as needed. Functional ability and level of safety reviewed and appropriate. Education, counseling and referrals performed based on assessed risks today. Patient provided with a copy of personalized plan for preventive services.   Colon Dr Ferdinand Lango 7/16 No OSA as of 2 years ago

## 2015-06-09 NOTE — Assessment & Plan Note (Signed)
ECHO

## 2015-06-09 NOTE — Assessment & Plan Note (Signed)
Per Dr Alvan Dame

## 2015-06-10 ENCOUNTER — Other Ambulatory Visit (INDEPENDENT_AMBULATORY_CARE_PROVIDER_SITE_OTHER): Payer: Medicare Other

## 2015-06-10 DIAGNOSIS — R011 Cardiac murmur, unspecified: Secondary | ICD-10-CM

## 2015-06-10 DIAGNOSIS — M5440 Lumbago with sciatica, unspecified side: Secondary | ICD-10-CM | POA: Diagnosis not present

## 2015-06-10 DIAGNOSIS — E559 Vitamin D deficiency, unspecified: Secondary | ICD-10-CM | POA: Diagnosis not present

## 2015-06-10 DIAGNOSIS — Z0000 Encounter for general adult medical examination without abnormal findings: Secondary | ICD-10-CM

## 2015-06-10 DIAGNOSIS — R202 Paresthesia of skin: Secondary | ICD-10-CM | POA: Diagnosis not present

## 2015-06-10 DIAGNOSIS — N5089 Other specified disorders of the male genital organs: Secondary | ICD-10-CM

## 2015-06-10 DIAGNOSIS — R5382 Chronic fatigue, unspecified: Secondary | ICD-10-CM | POA: Diagnosis not present

## 2015-06-10 DIAGNOSIS — E785 Hyperlipidemia, unspecified: Secondary | ICD-10-CM | POA: Diagnosis not present

## 2015-06-10 DIAGNOSIS — N320 Bladder-neck obstruction: Secondary | ICD-10-CM | POA: Diagnosis not present

## 2015-06-10 DIAGNOSIS — R739 Hyperglycemia, unspecified: Secondary | ICD-10-CM

## 2015-06-10 DIAGNOSIS — N508 Other specified disorders of male genital organs: Secondary | ICD-10-CM

## 2015-06-10 LAB — HEPATIC FUNCTION PANEL
ALT: 26 U/L (ref 0–53)
AST: 19 U/L (ref 0–37)
Albumin: 4.2 g/dL (ref 3.5–5.2)
Alkaline Phosphatase: 61 U/L (ref 39–117)
BILIRUBIN TOTAL: 0.5 mg/dL (ref 0.2–1.2)
Bilirubin, Direct: 0.1 mg/dL (ref 0.0–0.3)
Total Protein: 6.8 g/dL (ref 6.0–8.3)

## 2015-06-10 LAB — BASIC METABOLIC PANEL
BUN: 9 mg/dL (ref 6–23)
CO2: 28 mEq/L (ref 19–32)
Calcium: 9.4 mg/dL (ref 8.4–10.5)
Chloride: 105 mEq/L (ref 96–112)
Creatinine, Ser: 0.78 mg/dL (ref 0.40–1.50)
GFR: 107.22 mL/min (ref >60.00)
Glucose, Bld: 109 mg/dL — ABNORMAL HIGH (ref 70–99)
Potassium: 4 mEq/L (ref 3.5–5.1)
SODIUM: 141 meq/L (ref 135–145)

## 2015-06-10 LAB — URINALYSIS
Bilirubin Urine: NEGATIVE
KETONES UR: NEGATIVE
Leukocytes, UA: NEGATIVE
Nitrite: NEGATIVE
Specific Gravity, Urine: <=1.005 — AB (ref 1.000–1.030)
Total Protein, Urine: NEGATIVE
URINE GLUCOSE: NEGATIVE
Urobilinogen, UA: 0.2 (ref 0.0–1.0)
pH: 6 (ref 5.0–8.0)

## 2015-06-10 LAB — CBC WITH DIFFERENTIAL/PLATELET
Basophils Absolute: 0 10*3/uL (ref 0.0–0.1)
Basophils Relative: 0.5 % (ref 0.0–3.0)
Eosinophils Absolute: 0.2 10*3/uL (ref 0.0–0.7)
Eosinophils Relative: 3.1 % (ref 0.0–5.0)
HCT: 44.2 % (ref 39.0–52.0)
HEMOGLOBIN: 15 g/dL (ref 13.0–17.0)
LYMPHS ABS: 2.9 10*3/uL (ref 0.7–4.0)
Lymphocytes Relative: 35.9 % (ref 12.0–46.0)
MCHC: 34.1 g/dL (ref 30.0–36.0)
MCV: 94.3 fl (ref 78.0–100.0)
Monocytes Absolute: 0.8 10*3/uL (ref 0.1–1.0)
Monocytes Relative: 9.6 % (ref 3.0–12.0)
NEUTROS ABS: 4.1 10*3/uL (ref 1.4–7.7)
Neutrophils Relative %: 50.9 % (ref 43.0–77.0)
Platelets: 235 10*3/uL (ref 150.0–400.0)
RBC: 4.68 Mil/uL (ref 4.22–5.81)
RDW: 13.4 % (ref 11.5–15.5)
WBC: 8.1 10*3/uL (ref 4.0–10.5)

## 2015-06-10 LAB — LDL CHOLESTEROL, DIRECT: Direct LDL: 102 mg/dL

## 2015-06-10 LAB — LIPID PANEL
CHOL/HDL RATIO: 6
Cholesterol: 207 mg/dL — ABNORMAL HIGH (ref 0–200)
HDL: 37.5 mg/dL — ABNORMAL LOW (ref >39.00)
NonHDL: 169.5
Triglycerides: 288 mg/dL — ABNORMAL HIGH (ref 0.0–149.0)
VLDL: 57.6 mg/dL — AB (ref 0.0–40.0)

## 2015-06-10 LAB — VITAMIN B12: VITAMIN B 12: 1412 pg/mL — AB (ref 211–911)

## 2015-06-10 LAB — HEMOGLOBIN A1C: HEMOGLOBIN A1C: 5.4 % (ref 4.6–6.5)

## 2015-06-10 LAB — TSH: TSH: 3.98 u[IU]/mL (ref 0.35–4.50)

## 2015-06-10 LAB — TESTOSTERONE: TESTOSTERONE: 234.91 ng/dL — AB (ref 300.00–890.00)

## 2015-06-10 LAB — SEDIMENTATION RATE: Sed Rate: 5 mm/hr (ref 0–22)

## 2015-06-10 LAB — PSA: PSA: 0.67 ng/mL (ref 0.10–4.00)

## 2015-06-10 LAB — VITAMIN D 25 HYDROXY (VIT D DEFICIENCY, FRACTURES): VITD: 33.33 ng/mL (ref 30.00–100.00)

## 2015-06-11 DIAGNOSIS — Z800 Family history of malignant neoplasm of digestive organs: Secondary | ICD-10-CM | POA: Diagnosis not present

## 2015-06-11 DIAGNOSIS — K635 Polyp of colon: Secondary | ICD-10-CM | POA: Diagnosis not present

## 2015-06-11 DIAGNOSIS — K2270 Barrett's esophagus without dysplasia: Secondary | ICD-10-CM | POA: Diagnosis not present

## 2015-06-15 ENCOUNTER — Other Ambulatory Visit: Payer: Self-pay

## 2015-06-15 ENCOUNTER — Ambulatory Visit (HOSPITAL_COMMUNITY): Payer: Medicare Other | Attending: Cardiovascular Disease

## 2015-06-15 DIAGNOSIS — R011 Cardiac murmur, unspecified: Secondary | ICD-10-CM

## 2015-06-16 ENCOUNTER — Ambulatory Visit
Admission: RE | Admit: 2015-06-16 | Discharge: 2015-06-16 | Disposition: A | Discharge status: Home / Self Care | Payer: Medicare Other | Source: Ambulatory Visit | Attending: Internal Medicine | Admitting: Internal Medicine

## 2015-06-16 DIAGNOSIS — I861 Scrotal varices: Secondary | ICD-10-CM | POA: Diagnosis not present

## 2015-06-16 DIAGNOSIS — N5089 Other specified disorders of the male genital organs: Secondary | ICD-10-CM

## 2015-07-07 ENCOUNTER — Ambulatory Visit (INDEPENDENT_AMBULATORY_CARE_PROVIDER_SITE_OTHER): Payer: Medicare Other | Admitting: Internal Medicine

## 2015-07-07 ENCOUNTER — Encounter: Payer: Self-pay | Admitting: Internal Medicine

## 2015-07-07 VITALS — BP 110/64 | HR 73 | Wt 243.0 lb

## 2015-07-07 DIAGNOSIS — L309 Dermatitis, unspecified: Secondary | ICD-10-CM | POA: Insufficient documentation

## 2015-07-07 DIAGNOSIS — R739 Hyperglycemia, unspecified: Secondary | ICD-10-CM

## 2015-07-07 DIAGNOSIS — E785 Hyperlipidemia, unspecified: Secondary | ICD-10-CM

## 2015-07-07 HISTORY — DX: Dermatitis, unspecified: L30.9

## 2015-07-07 MED ORDER — TRIAMCINOLONE ACETONIDE 0.1 % EX CREA: 1.0000 "application " | TOPICAL_CREAM | Freq: Two times a day (BID) | CUTANEOUS | Status: DC

## 2015-07-07 NOTE — Assessment & Plan Note (Signed)
Pt is loosing wt

## 2015-07-07 NOTE — Assessment & Plan Note (Signed)
8/16 dyshidrotic eczema B Kenalog oint Watch diet

## 2015-07-07 NOTE — Progress Notes (Signed)
Pre visit review using our clinic review tool, if applicable. No additional management support is needed unless otherwise documented below in the visit note. 

## 2015-07-07 NOTE — Progress Notes (Signed)
Subjective:  Patient ID: [name], male    DOB: 13-Jun-1951  Age: 63 y.o. MRN: [id number]  CC: No chief complaint on file.   HPI [name] presents for rash on hands off and on x 6 months, elev glucose, elev TG  Outpatient Prescriptions Prior to Visit  Medication Sig Dispense Refill  . aspirin 81 MG tablet Take 81 mg by mouth at bedtime.     . cholecalciferol (VITAMIN D) 400 UNITS TABS Take 400 Units by mouth daily.    . cyanocobalamin 500 MCG tablet Take 1,000 mcg by mouth daily.     . fish oil-omega-3 fatty acids 1000 MG capsule Take 2 g by mouth daily.    . fluticasone (FLONASE) 50 MCG/ACT nasal spray Place 1 spray into both nostrils daily. 16 g 11  . glucosamine-chondroitin 500-400 MG tablet Take 1 tablet by mouth 3 (three) times daily.    . meloxicam (MOBIC) 7.5 MG tablet Take 1-2 tablets (7.5-15 mg total) by mouth daily. 60 tablet 5  . metoCLOPramide (REGLAN) 10 MG tablet Take 10 mg by mouth 4 (four) times daily.    . Multiple Vitamin (MULTIVITAMIN) tablet Take 1 tablet by mouth daily.    . pantoprazole (PROTONIX) 40 MG tablet Take 40 mg by mouth daily.      . traMADol (ULTRAM) 50 MG tablet TAKE 1 TO 2 TABLETS TWICE A DAY AS NEEDED FOR PAIN 120 tablet 3  . valACYclovir (VALTREX) 500 MG tablet TAKE ONE TABLET BY MOUTH ONCE DAILY 90 tablet 0  . metoCLOPramide (REGLAN) 5 MG tablet Take 1 tablet by mouth 3 (three) times daily.     Facility-Administered Medications Prior to Visit  Medication Dose Route Frequency Provider Last Rate Last Dose  . methylPREDNISolone acetate (DEPO-MEDROL) injection 40 mg  40 mg Intra-articular Once Aleksei Plotnikov V, MD        ROS Review of Systems  Constitutional: Negative for appetite change, fatigue and unexpected weight change.  HENT: Negative for congestion, nosebleeds, sneezing, sore throat and trouble swallowing.   Eyes: Negative for itching and visual disturbance.  Respiratory: Negative for cough.   Cardiovascular: Negative for  chest pain, palpitations and leg swelling.  Gastrointestinal: Negative for nausea, diarrhea, blood in stool and abdominal distention.  Genitourinary: Negative for frequency and hematuria.  Musculoskeletal: Negative for back pain, joint swelling, gait problem and neck pain.  Skin: Positive for rash.  Neurological: Negative for dizziness, tremors, speech difficulty and weakness.  Psychiatric/Behavioral: Negative for sleep disturbance, dysphoric mood and agitation. The patient is not nervous/anxious.     Objective:  BP 110/64 mmHg  Pulse 73  Wt 243 lb (110.224 kg)  SpO2 96%  BP Readings from Last 3 Encounters:  07/07/15 110/64  06/09/15 130/88  02/03/15 140/74    Wt Readings from Last 3 Encounters:  07/07/15 243 lb (110.224 kg)  06/09/15 249 lb (112.946 kg)  02/03/15 243 lb (110.224 kg)    Physical Exam  Constitutional: He is oriented to person, place, and time. He appears well-developed. No distress.  NAD  HENT:  Mouth/Throat: Oropharynx is clear and moist.  Eyes: Conjunctivae are normal. Pupils are equal, round, and reactive to light.  Neck: Normal range of motion. No JVD present. No thyromegaly present.  Cardiovascular: Normal rate, regular rhythm, normal heart sounds and intact distal pulses.  Exam reveals no gallop and no friction rub.   No murmur heard. Pulmonary/Chest: Effort normal and breath sounds normal. No respiratory distress. He has no wheezes.  He has no rales. He exhibits no tenderness.  Abdominal: Soft. Bowel sounds are normal. He exhibits no distension and no mass. There is no tenderness. There is no rebound and no guarding.  Musculoskeletal: Normal range of motion. He exhibits no edema or tenderness.  Lymphadenopathy:    He has no cervical adenopathy.  Neurological: He is alert and oriented to person, place, and time. He has normal reflexes. No cranial nerve deficit. He exhibits normal muscle tone. He displays a negative Romberg sign. Coordination and gait  normal.  Skin: Skin is warm and dry. Rash noted.  Psychiatric: He has a normal mood and affect. His behavior is normal. Judgment and thought content normal.  B hand eczema  Lab Results  Component Value Date   WBC 8.1 06/10/2015   HGB 15.0 06/10/2015   HCT 44.2 06/10/2015   PLT 235.0 06/10/2015   GLUCOSE 109* 06/10/2015   CHOL 207* 06/10/2015   TRIG 288.0* 06/10/2015   HDL 37.50* 06/10/2015   LDLDIRECT 102.0 06/10/2015   LDLCALC 137* 05/02/2008   ALT 26 06/10/2015   AST 19 06/10/2015   NA 141 06/10/2015   K 4.0 06/10/2015   CL 105 06/10/2015   CREATININE 0.78 06/10/2015   BUN 9 06/10/2015   CO2 28 06/10/2015   TSH 3.98 06/10/2015   PSA 0.67 06/10/2015   INR 0.91 07/25/2012   HGBA1C 5.4 06/10/2015    US Scrotum  06/16/2015   CLINICAL DATA:  Left scrotal mass.  EXAM: SCROTAL ULTRASOUND  DOPPLER ULTRASOUND OF THE TESTICLES  TECHNIQUE: Complete ultrasound examination of the testicles, epididymis, and other scrotal structures was performed. Color and spectral Doppler ultrasound were also utilized to evaluate blood flow to the testicles.  COMPARISON:  None.  FINDINGS: Right testicle  Measurements: 3.6 x 1.7 x 2.1 cm. No mass or microlithiasis visualized.  Left testicle  Measurements: 4.1 x 2.2 x 2.4 cm. No mass or microlithiasis visualized.  Right epididymis:  Normal in size and appearance.  Left epididymis:  Normal in size and appearance.  Hydrocele:  None visualized.  Varicocele:  Moderate-sized left varicocele noted.  Pulsed Doppler interrogation of both testes demonstrates normal low resistance arterial and venous waveforms bilaterally.  IMPRESSION: Moderate-sized left varicocele. Exam is otherwise unremarkable. No evidence of testicular mass or torsion.   Electronically Signed   By: Marcello Moores  Register   On: 06/16/2015 09:15    Assessment & Plan:   There are no diagnoses linked to this encounter. I am having Mr. Boehlke maintain his aspirin, pantoprazole, cholecalciferol, cyanocobalamin,  fish oil-omega-3 fatty acids, glucosamine-chondroitin, multivitamin, fluticasone, metoCLOPramide, meloxicam, valACYclovir, and traMADol. We will continue to administer methylPREDNISolone acetate.  No orders of the defined types were placed in this encounter.     Follow-up: No Follow-up on file.  Walker Kehr, MD

## 2015-08-03 ENCOUNTER — Other Ambulatory Visit: Payer: Self-pay | Admitting: Internal Medicine

## 2015-08-06 DIAGNOSIS — Z23 Encounter for immunization: Secondary | ICD-10-CM | POA: Diagnosis not present

## 2015-10-07 ENCOUNTER — Other Ambulatory Visit: Payer: Self-pay | Admitting: Internal Medicine

## 2015-10-12 ENCOUNTER — Other Ambulatory Visit: Payer: Self-pay | Admitting: Internal Medicine

## 2015-10-12 NOTE — Telephone Encounter (Signed)
Called refill into [facility] had to leave on pharmacy vm left msg approval.../lmb

## 2015-10-15 NOTE — Telephone Encounter (Signed)
rx called in this morning

## 2015-10-19 ENCOUNTER — Other Ambulatory Visit: Payer: Self-pay | Admitting: Internal Medicine

## 2015-11-01 ENCOUNTER — Other Ambulatory Visit: Payer: Self-pay | Admitting: Internal Medicine

## 2015-11-12 ENCOUNTER — Emergency Department (HOSPITAL_BASED_OUTPATIENT_CLINIC_OR_DEPARTMENT_OTHER): Payer: Medicare Other

## 2015-11-12 ENCOUNTER — Telehealth: Payer: Self-pay | Admitting: Internal Medicine

## 2015-11-12 ENCOUNTER — Encounter (HOSPITAL_BASED_OUTPATIENT_CLINIC_OR_DEPARTMENT_OTHER): Payer: Self-pay | Admitting: *Deleted

## 2015-11-12 ENCOUNTER — Emergency Department (HOSPITAL_BASED_OUTPATIENT_CLINIC_OR_DEPARTMENT_OTHER)
Admission: EM | Admit: 2015-11-12 | Discharge: 2015-11-12 | Disposition: A | Discharge status: Home / Self Care | Payer: Medicare Other | Attending: Emergency Medicine | Admitting: Emergency Medicine

## 2015-11-12 DIAGNOSIS — Z8709 Personal history of other diseases of the respiratory system: Secondary | ICD-10-CM | POA: Diagnosis not present

## 2015-11-12 DIAGNOSIS — F17210 Nicotine dependence, cigarettes, uncomplicated: Secondary | ICD-10-CM | POA: Insufficient documentation

## 2015-11-12 DIAGNOSIS — Z79899 Other long term (current) drug therapy: Secondary | ICD-10-CM | POA: Insufficient documentation

## 2015-11-12 DIAGNOSIS — Z8619 Personal history of other infectious and parasitic diseases: Secondary | ICD-10-CM | POA: Insufficient documentation

## 2015-11-12 DIAGNOSIS — Z7982 Long term (current) use of aspirin: Secondary | ICD-10-CM | POA: Insufficient documentation

## 2015-11-12 DIAGNOSIS — R059 Cough, unspecified: Secondary | ICD-10-CM

## 2015-11-12 DIAGNOSIS — Z7952 Long term (current) use of systemic steroids: Secondary | ICD-10-CM | POA: Diagnosis not present

## 2015-11-12 DIAGNOSIS — Z791 Long term (current) use of non-steroidal anti-inflammatories (NSAID): Secondary | ICD-10-CM | POA: Diagnosis not present

## 2015-11-12 DIAGNOSIS — R05 Cough: Secondary | ICD-10-CM | POA: Insufficient documentation

## 2015-11-12 DIAGNOSIS — Z86010 Personal history of colonic polyps: Secondary | ICD-10-CM | POA: Diagnosis not present

## 2015-11-12 DIAGNOSIS — K219 Gastro-esophageal reflux disease without esophagitis: Secondary | ICD-10-CM | POA: Insufficient documentation

## 2015-11-12 DIAGNOSIS — Z7951 Long term (current) use of inhaled steroids: Secondary | ICD-10-CM | POA: Diagnosis not present

## 2015-11-12 DIAGNOSIS — M1990 Unspecified osteoarthritis, unspecified site: Secondary | ICD-10-CM | POA: Insufficient documentation

## 2015-11-12 DIAGNOSIS — Z85828 Personal history of other malignant neoplasm of skin: Secondary | ICD-10-CM | POA: Diagnosis not present

## 2015-11-12 DIAGNOSIS — R079 Chest pain, unspecified: Secondary | ICD-10-CM | POA: Insufficient documentation

## 2015-11-12 DIAGNOSIS — Z872 Personal history of diseases of the skin and subcutaneous tissue: Secondary | ICD-10-CM | POA: Insufficient documentation

## 2015-11-12 DIAGNOSIS — R0789 Other chest pain: Secondary | ICD-10-CM | POA: Diagnosis not present

## 2015-11-12 DIAGNOSIS — Z880 Allergy status to penicillin: Secondary | ICD-10-CM | POA: Diagnosis not present

## 2015-11-12 LAB — CBC WITH DIFFERENTIAL/PLATELET
Basophils Absolute: 0 10*3/uL (ref 0.0–0.1)
Basophils Relative: 0 %
Eosinophils Absolute: 0.2 10*3/uL (ref 0.0–0.7)
Eosinophils Relative: 3 %
HCT: 40.8 % (ref 39.0–52.0)
Hemoglobin: 14.2 g/dL (ref 13.0–17.0)
Lymphocytes Relative: 36 %
Lymphs Abs: 2.7 10*3/uL (ref 0.7–4.0)
MCH: 31.8 pg (ref 26.0–34.0)
MCHC: 34.8 g/dL (ref 30.0–36.0)
MCV: 91.3 fL (ref 78.0–100.0)
Monocytes Absolute: 0.7 10*3/uL (ref 0.1–1.0)
Monocytes Relative: 9 %
Neutro Abs: 3.9 10*3/uL (ref 1.7–7.7)
Neutrophils Relative %: 52 %
Platelets: 282 10*3/uL (ref 150–400)
RBC: 4.47 MIL/uL (ref 4.22–5.81)
RDW: 12.8 % (ref 11.5–15.5)
WBC: 7.5 10*3/uL (ref 4.0–10.5)

## 2015-11-12 LAB — BASIC METABOLIC PANEL
Anion gap: 8 (ref 5–15)
BUN: 18 mg/dL (ref 6–20)
CO2: 23 mmol/L (ref 22–32)
Calcium: 9.2 mg/dL (ref 8.9–10.3)
Chloride: 107 mmol/L (ref 101–111)
Creatinine, Ser: 0.88 mg/dL (ref 0.61–1.24)
GFR calc Af Amer: >60 mL/min (ref >60)
GFR calc non Af Amer: >60 mL/min (ref >60)
Glucose, Bld: 143 mg/dL — ABNORMAL HIGH (ref 65–99)
Potassium: 3.6 mmol/L (ref 3.5–5.1)
Sodium: 138 mmol/L (ref 135–145)

## 2015-11-12 LAB — D-DIMER, QUANTITATIVE (NOT AT ARMC): D-Dimer, Quant: 0.56 ug/mL-FEU — ABNORMAL HIGH (ref 0.00–0.50)

## 2015-11-12 LAB — TROPONIN I: Troponin I: <0.03 ng/mL (ref <0.031)

## 2015-11-12 MED ORDER — IOHEXOL 350 MG/ML SOLN
100.0000 mL | Freq: Once | INTRAVENOUS | Status: AC | PRN
  Administered 2015-11-12: 100 mL via INTRAVENOUS

## 2015-11-12 NOTE — ED Notes (Signed)
Pt returns from radiology. 

## 2015-11-12 NOTE — Discharge Instructions (Signed)
Cough, Adult °Coughing is a reflex that clears your throat and your airways. Coughing helps to heal and protect your lungs. It is normal to cough occasionally, but a cough that happens with other symptoms or lasts a long time may be a sign of a condition that needs treatment. A cough may last only 2-3 weeks (acute), or it may last longer than 8 weeks (chronic). °CAUSES °Coughing is commonly caused by: °· Breathing in substances that irritate your lungs. °· A viral or bacterial respiratory infection. °· Allergies. °· Asthma. °· Postnasal drip. °· Smoking. °· Acid backing up from the stomach into the esophagus (gastroesophageal reflux). °· Certain medicines. °· Chronic lung problems, including COPD (or rarely, lung cancer). °· Other medical conditions such as heart failure. °HOME CARE INSTRUCTIONS  °Pay attention to any changes in your symptoms. Take these actions to help with your discomfort: °· Take medicines only as told by your health care provider. °· If you were prescribed an antibiotic medicine, take it as told by your health care provider. Do not stop taking the antibiotic even if you start to feel better. °· Talk with your health care provider before you take a cough suppressant medicine. °· Drink enough fluid to keep your urine clear or pale yellow. °· If the air is dry, use a cold steam vaporizer or humidifier in your bedroom or your home to help loosen secretions. °· Avoid anything that causes you to cough at work or at home. °· If your cough is worse at night, try sleeping in a semi-upright position. °· Avoid cigarette smoke. If you smoke, quit smoking. If you need help quitting, ask your health care provider. °· Avoid caffeine. °· Avoid alcohol. °· Rest as needed. °SEEK MEDICAL CARE IF:  °· You have new symptoms. °· You cough up pus. °· Your cough does not get better after 2-3 weeks, or your cough gets worse. °· You cannot control your cough with suppressant medicines and you are losing sleep. °· You  develop pain that is getting worse or pain that is not controlled with pain medicines. °· You have a fever. °· You have unexplained weight loss. °· You have night sweats. °SEEK IMMEDIATE MEDICAL CARE IF: °· You cough up blood. °· You have difficulty breathing. °· Your heartbeat is very fast. °  °This information is not intended to replace advice given to you by your health care provider. Make sure you discuss any questions you have with your health care provider. °  °Document Released: 04/29/2011 Document Revised: 07/22/2015 Document Reviewed: 01/07/2015 °Elsevier Interactive Patient Education ©2016 [employer] ° °Nonspecific Chest Pain  °Chest pain can be caused by many different conditions. There is always a chance that your pain could be related to something serious, such as a heart attack or a blood clot in your lungs. Chest pain can also be caused by conditions that are not life-threatening. If you have chest pain, it is very important to follow up with your health care provider. °CAUSES  °Chest pain can be caused by: °· Heartburn. °· Pneumonia or bronchitis. °· Anxiety or stress. °· Inflammation around your heart (pericarditis) or lung (pleuritis or pleurisy). °· A blood clot in your lung. °· A collapsed lung (pneumothorax). It can develop suddenly on its own (spontaneous pneumothorax) or from trauma to the chest. °· Shingles infection (varicella-zoster virus). °· Heart attack. °· Damage to the bones, muscles, and cartilage that make up your chest wall. This can include: °¨ Bruised bones due to injury. °¨   Strained muscles or cartilage due to frequent or repeated coughing or overwork. °¨ Fracture to one or more ribs. °¨ Sore cartilage due to inflammation (costochondritis). °RISK FACTORS  °Risk factors for chest pain may include: °· Activities that increase your risk for trauma or injury to your chest. °· Respiratory infections or conditions that cause frequent coughing. °· Medical conditions or overeating  that can cause heartburn. °· Heart disease or family history of heart disease. °· Conditions or health behaviors that increase your risk of developing a blood clot. °· Having had chicken pox (varicella zoster). °SIGNS AND SYMPTOMS °Chest pain can feel like: °· Burning or tingling on the surface of your chest or deep in your chest. °· Crushing, pressure, aching, or squeezing pain. °· Dull or sharp pain that is worse when you move, cough, or take a deep breath. °· Pain that is also felt in your back, neck, shoulder, or arm, or pain that spreads to any of these areas. °Your chest pain may come and go, or it may stay constant. °DIAGNOSIS °Lab tests or other studies may be needed to find the cause of your pain. Your health care provider may have you take a test called an ambulatory ECG (electrocardiogram). An ECG records your heartbeat patterns at the time the test is performed. You may also have other tests, such as: °· Transthoracic echocardiogram (TTE). During echocardiography, sound waves are used to create a picture of all of the heart structures and to look at how blood flows through your heart. °· Transesophageal echocardiogram (TEE). This is a more advanced imaging test that obtains images from inside your body. It allows your health care provider to see your heart in finer detail. °· Cardiac monitoring. This allows your health care provider to monitor your heart rate and rhythm in real time. °· Holter monitor. This is a portable device that records your heartbeat and can help to diagnose abnormal heartbeats. It allows your health care provider to track your heart activity for several days, if needed. °· Stress tests. These can be done through exercise or by taking medicine that makes your heart beat more quickly. °· Blood tests. °· Imaging tests. °TREATMENT  °Your treatment depends on what is causing your chest pain. Treatment may include: °· Medicines. These may include: °¨ Acid blockers for  heartburn. °¨ Anti-inflammatory medicine. °¨ Pain medicine for inflammatory conditions. °¨ Antibiotic medicine, if an infection is present. °¨ Medicines to dissolve blood clots. °¨ Medicines to treat coronary artery disease. °· Supportive care for conditions that do not require medicines. This may include: °¨ Resting. °¨ Applying heat or cold packs to injured areas. °¨ Limiting activities until pain decreases. °HOME CARE INSTRUCTIONS °· If you were prescribed an antibiotic medicine, finish it all even if you start to feel better. °· Avoid any activities that bring on chest pain. °· Do not use any tobacco products, including cigarettes, chewing tobacco, or electronic cigarettes. If you need help quitting, ask your health care provider. °· Do not drink alcohol. °· Take medicines only as directed by your health care provider. °· Keep all follow-up visits as directed by your health care provider. This is important. This includes any further testing if your chest pain does not go away. °· If heartburn is the cause for your chest pain, you may be told to keep your head raised (elevated) while sleeping. This reduces the chance that acid will go from your stomach into your esophagus. °· Make lifestyle changes as directed by your health   care provider. These may include: °¨ Getting regular exercise. Ask your health care provider to suggest some activities that are safe for you. °¨ Eating a heart-healthy diet. A registered dietitian can help you to learn healthy eating options. °¨ Maintaining a healthy weight. °¨ Managing diabetes, if necessary. °¨ Reducing stress. °SEEK MEDICAL CARE IF: °· Your chest pain does not go away after treatment. °· You have a rash with blisters on your chest. °· You have a fever. °SEEK IMMEDIATE MEDICAL CARE IF:  °· Your chest pain is worse. °· You have an increasing cough, or you cough up blood. °· You have severe abdominal pain. °· You have severe weakness. °· You faint. °· You have chills. °· You  have sudden, unexplained chest discomfort. °· You have sudden, unexplained discomfort in your arms, back, neck, or jaw. °· You have shortness of breath at any time. °· You suddenly start to sweat, or your skin gets clammy. °· You feel nauseous or you vomit. °· You suddenly feel light-headed or dizzy. °· Your heart begins to beat quickly, or it feels like it is skipping beats. °These symptoms may represent a serious problem that is an emergency. Do not wait to see if the symptoms will go away. Get medical help right away. Call your local emergency services (911 in the [place] Do not drive yourself to the hospital. °  °This information is not intended to replace advice given to you by your health care provider. Make sure you discuss any questions you have with your health care provider. °  °Document Released: 08/10/2005 Document Revised: 11/21/2014 Document Reviewed: 06/06/2014 °Elsevier Interactive Patient Education ©2016 [employer] Inc[employer] ° °

## 2015-11-12 NOTE — ED Notes (Signed)
Presents with Chest Pain, states onset was 2 weeks ago, states also has had a cold, took NyQuil, then began having indigestion and heartburn, then having a strong prod cough

## 2015-11-12 NOTE — Telephone Encounter (Signed)
PLEASE NOTE: All timestamps contained within this report are represented as [place] Standard Time. CONFIDENTIALTY NOTICE: This fax transmission is intended only for the addressee. It contains information that is legally privileged, confidential or otherwise protected from use or disclosure. If you are not the intended recipient, you are strictly prohibited from reviewing, disclosing, copying using or disseminating any of this information or taking any action in reliance on or regarding this information. If you have received this fax in error, please notify us immediately by telephone so that we can arrange for its return to [place]. Phone: [phone], Toll-Free: [phone], Fax: [phone] Page: 1 of 1 Call Id: [id number] [facility] [place] Patient Name: EDWARDS MINDEL DOB: 25-Apr-1951 Initial Comment Caller states he has been having chest pain for weeks, has been getting worse, coughing up blackish mucous Nurse Assessment Nurse: Marcelline Deist, RN, Kermit Balo Date/Time ([place] Time): 11/12/2015 10:59:35 AM Confirm and document reason for call. If symptomatic, describe symptoms. ---Caller states he has been having chest pain left of center for weeks,. He has been getting worse, coughing up hunks of blackish mucous. Had recently had a cold with cough. No fever. Has the patient traveled out of the country within the last 30 days? ---Not Applicable Does the patient have any new or worsening symptoms? ---Yes Will a triage be completed? ---Yes Related visit to physician within the last 2 weeks? ---No Does the PT have any chronic conditions? (i.e. diabetes, asthma, etc.) ---Yes List chronic conditions. ---hiatal hernia, heart murmur Is this a behavioral health or substance abuse call? ---No Guidelines Guideline Title Affirmed Question Affirmed Notes Chest Pain [1] Chest pain lasts > 5 minutes AND [2] described as crushing,  pressure-like, or heavy Final Disposition User Call EMS [place], RN, Kermit Balo Comments Caller states he was a recreational pot smoker for years, wondering if the black mucus could be from that. He is unable to go to the hospital or call for EMS now as he is taking care of his 66 month old granddaughter. He is asking for an appt. later this afternoon after 3 pm. Nurse will make office aware that patient can not be seen at hospital/call EMS as recommended. Please contact patient at this # since patient can not call EMS/911 or go to hospital at this time to be evaluated. Referrals GO TO FACILITY UNDECIDED REFERRED TO PCP OFFICE Disagree/Comply: Disagree Disagree/Comply Reason: Disagree with instructions

## 2015-11-12 NOTE — Telephone Encounter (Signed)
PCP is out of office. I asked Terri Piedra, FNP and he advises UC. Pt informed and agrees.

## 2015-11-12 NOTE — ED Provider Notes (Signed)
CSN: [id number]     Arrival date & time 11/12/15  1307 History   First MD Initiated Contact with Patient 11/12/15 1323     Chief Complaint  Patient presents with  . Chest Pain     (Consider location/radiation/quality/duration/timing/severity/associated sxs/prior Treatment) HPI   64 year old male with cough, congestion and chest pain. Symptom onset about 2 weeks ago and "started out as a cold." Reports subjective fever initially which has since resolved. His had a persistent cough which has been productive for blackish sputum over the past 2 days. Pain in the center of his chest was in relatively constant for the past several days. Describes as a soreness. No appreciable exacerbating relieving factors. Does not really sound pleuritic. No unusual leg pain or swelling. Denies any past history of DVT/PE. No known coronary artery disease but does have family history of it. Dobutamine stress test 06/2012 without evidence of ischemia. Transthoracic echocardiogram on 06/2015 with normal systolic function and no regional wall motion abnormalities, mild LV wall thickening consistent with mild LVH. Smokes marijuana.  Past Medical History  Diagnosis Date  . OA (osteoarthritis) of knee   . Colonic polyp   . GERD (gastroesophageal reflux disease)   . HERPES, GENITAL NOS   . ONYCHOMYCOSIS   . Neoplasm of uncertain behavior of skin   . SINUSITIS, ACUTE   . CONTACT DERMATITIS DUE TO SOLVENTS   . Actinic keratosis   . OSTEOARTHRITIS   . KNEE PAIN   . COLONIC POLYPS, HX OF   . HAND PAIN   . Well adult exam   . Preop exam for internal medicine   . Ingrowing toenail of left foot 07-25-12    is resolved now 07-25-12  . DOE (dyspnea on exertion) 07-25-12    related to eating shrimp   Past Surgical History  Procedure Laterality Date  . Knee arthroscopy      Left  . Umbilical hernia repair  07-25-12    10 yrs ago  . 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 Joint replacement  9/13    L TKR   Family History  Problem Relation Age of Onset  . Coronary artery disease Other   . Diabetes Mother   . Heart disease Father 30    CAD, MI, CHF   Social History  Substance Use Topics  . Smoking status: Current Some Day Smoker    Types: Cigars  . Smokeless tobacco: None     Comment: occ. cigar  . Alcohol Use: Yes     Comment: occ. socially    Review of Systems  All systems reviewed and negative, other than as noted in HPI.   Allergies  Shrimp; Hytrin; Lipitor; and Penicillins  Home Medications   Prior to Admission medications   Medication Sig Start Date End Date Taking? Authorizing Provider  aspirin 81 MG tablet Take 81 mg by mouth at bedtime.     Historical Provider, MD  cholecalciferol (VITAMIN D) 400 UNITS TABS Take 400 Units by mouth daily.    Historical Provider, MD  cyanocobalamin 500 MCG tablet Take 1,000 mcg by mouth daily.     Historical Provider, MD  fish oil-omega-3 fatty acids 1000 MG capsule Take 2 g by mouth daily.    Historical Provider, MD  fluticasone (FLONASE) 50 MCG/ACT nasal spray USE ONE SPRAY(S) IN EACH NOSTRIL ONCE DAILY 10/20/15  Aleksei Plotnikov V, MD  glucosamine-chondroitin 500-400 MG tablet Take 1 tablet by mouth 3 (three) times daily.    Historical Provider, MD  meloxicam (MOBIC) 7.5 MG tablet TAKE ONE TO TWO TABLETS BY MOUTH ONCE DAILY 10/20/15   Lew Dawes V, MD  metoCLOPramide (REGLAN) 10 MG tablet Take 10 mg by mouth 4 (four) times daily.    Historical Provider, MD  Multiple Vitamin (MULTIVITAMIN) tablet Take 1 tablet by mouth daily.    Historical Provider, MD  pantoprazole (PROTONIX) 40 MG tablet Take 40 mg by mouth daily.      Historical Provider, MD  traMADol (ULTRAM) 50 MG tablet TAKE ONE TO TWO TABLETS BY MOUTH TWICE DAILY AS NEEDED FOR PAIN 10/14/15   Aleksei Plotnikov V, MD  triamcinolone cream (KENALOG) 0.1 %  Apply 1 application topically 2 (two) times daily. Use prn 07/07/15   Lew Dawes V, MD  valACYclovir (VALTREX) 500 MG tablet TAKE ONE CAPLET BY MOUTH ONCE DAILY 11/02/15   Aleksei Plotnikov V, MD   BP 153/80 mmHg  Pulse 70  Temp(Src) 98.6 F (37 C) (Oral)  Resp 18  Ht 6' (1.829 m)  Wt 230 lb (104.327 kg)  BMI 31.19 kg/m2  SpO2 99% Physical Exam  Constitutional: He appears well-developed and well-nourished. No distress.  HENT:  Head: Normocephalic and atraumatic.  Eyes: Conjunctivae are normal. Right eye exhibits no discharge. Left eye exhibits no discharge.  Neck: Neck supple.  Cardiovascular: Normal rate, regular rhythm and normal heart sounds.  Exam reveals no gallop and no friction rub.   No murmur heard. Pulmonary/Chest: Effort normal and breath sounds normal. No respiratory distress.  Abdominal: Soft. He exhibits no distension. There is no tenderness.  Musculoskeletal: He exhibits no edema or tenderness.  Lower extremities symmetric as compared to each other. No calf tenderness. Negative Homan's. No palpable cords.   Neurological: He is alert.  Skin: Skin is warm and dry.  Psychiatric: He has a normal mood and affect. His behavior is normal. Thought content normal.  Nursing note and vitals reviewed.   ED Course  Procedures (including critical care time) Labs Review Labs Reviewed  BASIC METABOLIC PANEL - Abnormal; Notable for the following:    Glucose, Bld 143 (*)    All other components within normal limits  D-DIMER, QUANTITATIVE (NOT AT [facility]) - Abnormal; Notable for the following:    D-Dimer, Quant 0.56 (*)    All other components within normal limits  CBC WITH DIFFERENTIAL/PLATELET  TROPONIN I    Imaging Review Dg Chest 2 View  11/12/2015  CLINICAL DATA:  Chest pain EXAM: CHEST  2 VIEW COMPARISON:  07/16/2012 chest radiograph. FINDINGS: Stable cardiomediastinal silhouette with normal heart size. No pneumothorax. No pleural effusion. Clear lungs, with no  focal lung consolidation and no pulmonary edema. Sclerotic nodular focus overlying the anterior right fifth rib is stable and correlates with a sclerotic bone lesion in the anterior right fifth rib on the 07/17/2012 cardiac CT study, in keeping with a benign bone island. IMPRESSION: No active cardiopulmonary disease. Electronically Signed   By: Ilona Sorrel M.D.   On: 11/12/2015 14:04   I have personally reviewed and evaluated these images and lab results as part of my medical decision-making.   EKG Interpretation   Date/Time:  Thursday November 12 2015 13:20:50 EST Ventricular Rate:  73 PR Interval:  164 QRS Duration: 91 QT Interval:  391 QTC Calculation: 431 R Axis:   -7 Text Interpretation:  Sinus rhythm No significant change since  last  tracing Confirmed by Wilson Singer  MD, [place] [phone]) on 11/12/2015 2:09:57 PM      MDM   Final diagnoses:  Chest pain, unspecified chest pain type  Cough    64 year old male with chest pain and cough which has been productive for blackish sputum. Atypical symptoms for ACS. Chest x-ray without any acute abnormality. Pain is not pleuritic and does not specifically endorse shortness of breath but oxygen saturations have been as low as 94% on room air. No clinical signs or symptoms of DVT but some concern for possible PE. Will obtain d-dimer. CT if positive. If negative, I feel can be discharged with outpt FU.     Virgel Manifold, MD 11/12/15 1539

## 2015-11-12 NOTE — ED Notes (Signed)
States chest pain x 2 weeks after having a cold. Today he coughed a large black thick contents.

## 2015-11-19 ENCOUNTER — Other Ambulatory Visit: Payer: Self-pay | Admitting: Internal Medicine

## 2016-01-07 ENCOUNTER — Ambulatory Visit: Payer: Medicare Other | Admitting: Internal Medicine

## 2016-01-11 ENCOUNTER — Other Ambulatory Visit: Payer: Self-pay | Admitting: Internal Medicine

## 2016-01-11 ENCOUNTER — Ambulatory Visit: Payer: Medicare Other | Admitting: Internal Medicine

## 2016-01-11 NOTE — Telephone Encounter (Signed)
sch OV 

## 2016-01-12 ENCOUNTER — Telehealth: Payer: Self-pay | Admitting: Internal Medicine

## 2016-01-12 MED ORDER — PREDNISONE 10 MG PO TABS: ORAL_TABLET | ORAL | Status: DC

## 2016-01-12 NOTE — Telephone Encounter (Signed)
Phoned into pharmacy. Pt has an appt scheduled 3.27.17

## 2016-01-12 NOTE — Telephone Encounter (Signed)
Refill done.  

## 2016-01-12 NOTE — Telephone Encounter (Signed)
Patient states he has gotten into some poison oak.  He is requesting Dr. Camila Li to send in prednisone into his pharmacy at [facility] in [place].  Patient states he has his Granddaughter all day and will have an issue making a OV.

## 2016-01-12 NOTE — Telephone Encounter (Signed)
OK - he can pick up a Rx Thx

## 2016-01-13 MED ORDER — PREDNISONE 10 MG PO TABS: ORAL_TABLET | ORAL | Status: DC

## 2016-01-13 NOTE — Telephone Encounter (Signed)
Refax rx to [facility]...[name]

## 2016-01-13 NOTE — Telephone Encounter (Signed)
Rx faxed to [place]. Pt informed

## 2016-01-13 NOTE — Addendum Note (Signed)
Addended by: Earnstine Regal on: 01/13/2016 11:43 AM   Modules accepted: Orders

## 2016-01-13 NOTE — Telephone Encounter (Signed)
[facility] called [facility].  [facility] not [facility] script.  Can you please refax.

## 2016-01-14 DIAGNOSIS — K2270 Barrett's esophagus without dysplasia: Secondary | ICD-10-CM | POA: Diagnosis not present

## 2016-01-30 ENCOUNTER — Other Ambulatory Visit: Payer: Self-pay | Admitting: Internal Medicine

## 2016-02-08 ENCOUNTER — Other Ambulatory Visit (INDEPENDENT_AMBULATORY_CARE_PROVIDER_SITE_OTHER): Payer: Medicare Other

## 2016-02-08 ENCOUNTER — Encounter: Payer: Self-pay | Admitting: Internal Medicine

## 2016-02-08 ENCOUNTER — Ambulatory Visit (INDEPENDENT_AMBULATORY_CARE_PROVIDER_SITE_OTHER): Payer: Medicare Other | Admitting: Internal Medicine

## 2016-02-08 VITALS — BP 128/70 | HR 78 | Wt 239.0 lb

## 2016-02-08 DIAGNOSIS — M5440 Lumbago with sciatica, unspecified side: Secondary | ICD-10-CM

## 2016-02-08 DIAGNOSIS — M25561 Pain in right knee: Secondary | ICD-10-CM | POA: Diagnosis not present

## 2016-02-08 DIAGNOSIS — K2270 Barrett's esophagus without dysplasia: Secondary | ICD-10-CM

## 2016-02-08 DIAGNOSIS — I2583 Coronary atherosclerosis due to lipid rich plaque: Secondary | ICD-10-CM

## 2016-02-08 DIAGNOSIS — R739 Hyperglycemia, unspecified: Secondary | ICD-10-CM

## 2016-02-08 DIAGNOSIS — I2510 Atherosclerotic heart disease of native coronary artery without angina pectoris: Secondary | ICD-10-CM

## 2016-02-08 DIAGNOSIS — M25562 Pain in left knee: Secondary | ICD-10-CM

## 2016-02-08 LAB — BASIC METABOLIC PANEL
BUN: 19 mg/dL (ref 6–23)
CO2: 26 mEq/L (ref 19–32)
CREATININE: 0.86 mg/dL (ref 0.40–1.50)
Calcium: 9.6 mg/dL (ref 8.4–10.5)
Chloride: 104 mEq/L (ref 96–112)
GFR: 95.59 mL/min (ref >60.00)
GLUCOSE: 101 mg/dL — AB (ref 70–99)
POTASSIUM: 4.3 meq/L (ref 3.5–5.1)
Sodium: 138 mEq/L (ref 135–145)

## 2016-02-08 LAB — HEMOGLOBIN A1C: Hgb A1c MFr Bld: 5.7 % (ref 4.6–6.5)

## 2016-02-08 MED ORDER — TRAMADOL HCL 50 MG PO TABS: ORAL_TABLET | ORAL | Status: DC

## 2016-02-08 NOTE — Addendum Note (Signed)
Addended by: Cresenciano Lick on: 02/08/2016 08:12 AM   Modules accepted: Medications

## 2016-02-08 NOTE — Progress Notes (Signed)
Subjective:  Patient ID: [name], male    DOB: 01/24/1951  Age: 65 y.o. MRN: [id number]  CC: No chief complaint on file.   HPI CRIT SCHOR presents for OA, obesity, GERD f/u. C/o wt gain. C/o LBP - planning to see Dr Jordan Likes  Outpatient Prescriptions Prior to Visit  Medication Sig Dispense Refill  . aspirin 81 MG tablet Take 81 mg by mouth at bedtime.     . cholecalciferol (VITAMIN D) 400 UNITS TABS Take 400 Units by mouth daily.    . cyanocobalamin 500 MCG tablet Take 1,000 mcg by mouth daily.     . fish oil-omega-3 fatty acids 1000 MG capsule Take 2 g by mouth daily.    . fluticasone (FLONASE) 50 MCG/ACT nasal spray USE ONE SPRAY(S) IN EACH NOSTRIL ONCE DAILY 16 g 1  . glucosamine-chondroitin 500-400 MG tablet Take 1 tablet by mouth 3 (three) times daily.    . meloxicam (MOBIC) 7.5 MG tablet TAKE ONE TO TWO TABLETS BY MOUTH ONCE DAILY 60 tablet 3  . metoCLOPramide (REGLAN) 10 MG tablet Take 10 mg by mouth 4 (four) times daily.    . Multiple Vitamin (MULTIVITAMIN) tablet Take 1 tablet by mouth daily.    . pantoprazole (PROTONIX) 40 MG tablet Take 40 mg by mouth daily.      . predniSONE (DELTASONE) 10 MG tablet Prednisone 10 mg: take 4 tabs a day x 3 days; then 3 tabs a day x 4 days; then 2 tabs a day x 4 days, then 1 tab a day x 6 days, then stop. Take pc. 38 tablet 1  . traMADol (ULTRAM) 50 MG tablet TAKE ONE TO TWO TABLETS BY MOUTH TWICE DAILY AS NEEDED FOR PAIN 120 tablet 1  . triamcinolone cream (KENALOG) 0.1 % Apply 1 application topically 2 (two) times daily. Use prn 45 g 3  . valACYclovir (VALTREX) 500 MG tablet TAKE ONE TABLET BY MOUTH ONCE DAILY 90 tablet 0   Facility-Administered Medications Prior to Visit  Medication Dose Route Frequency Provider Last Rate Last Dose  . methylPREDNISolone acetate (DEPO-MEDROL) injection 40 mg  40 mg Intra-articular Once Jada Fass V, MD        ROS Review of Systems  Constitutional: Positive for fatigue and unexpected weight  change. Negative for appetite change.  HENT: Negative for congestion, nosebleeds, sneezing, sore throat and trouble swallowing.   Eyes: Negative for itching and visual disturbance.  Respiratory: Negative for cough.   Cardiovascular: Negative for chest pain, palpitations and leg swelling.  Gastrointestinal: Negative for nausea, diarrhea, blood in stool and abdominal distention.  Genitourinary: Negative for frequency and hematuria.  Musculoskeletal: Positive for back pain, arthralgias and gait problem. Negative for joint swelling and neck pain.  Skin: Negative for rash.  Neurological: Negative for dizziness, tremors, speech difficulty and weakness.  Psychiatric/Behavioral: Negative for sleep disturbance, dysphoric mood and agitation. The patient is not nervous/anxious.     Objective:  BP 128/70 mmHg  Pulse 78  Wt 239 lb (108.41 kg)  SpO2 97%  BP Readings from Last 3 Encounters:  02/08/16 128/70  11/12/15 142/95  07/07/15 110/64    Wt Readings from Last 3 Encounters:  02/08/16 239 lb (108.41 kg)  11/12/15 230 lb (104.327 kg)  07/07/15 243 lb (110.224 kg)    Physical Exam  Constitutional: He is oriented to person, place, and time. He appears well-developed. No distress.  NAD  HENT:  Mouth/Throat: Oropharynx is clear and moist.  Eyes: Conjunctivae are normal.  Pupils are equal, round, and reactive to light.  Neck: Normal range of motion. No JVD present. No thyromegaly present.  Cardiovascular: Normal rate, regular rhythm, normal heart sounds and intact distal pulses.  Exam reveals no gallop and no friction rub.   No murmur heard. Pulmonary/Chest: Effort normal and breath sounds normal. No respiratory distress. He has no wheezes. He has no rales. He exhibits no tenderness.  Abdominal: Soft. Bowel sounds are normal. He exhibits no distension and no mass. There is no tenderness. There is no rebound and no guarding.  Musculoskeletal: Normal range of motion. He exhibits no edema or  tenderness.  Lymphadenopathy:    He has no cervical adenopathy.  Neurological: He is alert and oriented to person, place, and time. He has normal reflexes. No cranial nerve deficit. He exhibits normal muscle tone. He displays a negative Romberg sign. Coordination and gait normal.  Skin: Skin is warm and dry. No rash noted.  Psychiatric: He has a normal mood and affect. His behavior is normal. Judgment and thought content normal.    Lab Results  Component Value Date   WBC 7.5 11/12/2015   HGB 14.2 11/12/2015   HCT 40.8 11/12/2015   PLT 282 11/12/2015   GLUCOSE 143* 11/12/2015   CHOL 207* 06/10/2015   TRIG 288.0* 06/10/2015   HDL 37.50* 06/10/2015   LDLDIRECT 102.0 06/10/2015   LDLCALC 137* 05/02/2008   ALT 26 06/10/2015   AST 19 06/10/2015   NA 138 11/12/2015   K 3.6 11/12/2015   CL 107 11/12/2015   CREATININE 0.88 11/12/2015   BUN 18 11/12/2015   CO2 23 11/12/2015   TSH 3.98 06/10/2015   PSA 0.67 06/10/2015   INR 0.91 07/25/2012   HGBA1C 5.4 06/10/2015    Dg Chest 2 View  11/12/2015  CLINICAL DATA:  Chest pain EXAM: CHEST  2 VIEW COMPARISON:  07/16/2012 chest radiograph. FINDINGS: Stable cardiomediastinal silhouette with normal heart size. No pneumothorax. No pleural effusion. Clear lungs, with no focal lung consolidation and no pulmonary edema. Sclerotic nodular focus overlying the anterior right fifth rib is stable and correlates with a sclerotic bone lesion in the anterior right fifth rib on the 07/17/2012 cardiac CT study, in keeping with a benign bone island. IMPRESSION: No active cardiopulmonary disease. Electronically Signed   By: Delbert Phenix M.D.   On: 11/12/2015 14:04   Ct Angio Chest Pe W/cm &/or Wo Cm  11/12/2015  CLINICAL DATA:  Mid and left chest pressure for the past few weeks. Cough. Elevated D-dimer. EXAM: CT ANGIOGRAPHY CHEST WITH CONTRAST TECHNIQUE: Multidetector CT imaging of the chest was performed using the standard protocol during bolus administration of  intravenous contrast. Multiplanar CT image reconstructions and MIPs were obtained to evaluate the vascular anatomy. CONTRAST:  OMNIPAQUE IOHEXOL 350 MG/ML SOLN COMPARISON:  Chest radiographs obtained earlier today. Chest CT dated 07/17/2012. FINDINGS: Mediastinum/Lymph Nodes: No pulmonary emboli or thoracic aortic dissection identified. No masses or pathologically enlarged lymph nodes identified. Lungs/Pleura: The previously seen 3 mm right middle lobe nodule is slightly smaller and less dense. No new nodules. Mild bilateral dependent atelectasis. Upper abdomen: No acute findings. Musculoskeletal: Thoracic spine degenerative changes. Review of the MIP images confirms the above findings. IMPRESSION: 1. No pulmonary emboli. 2. Slight decrease in size of a 3 mm right middle lobe nodule, compatible with a benign process. Electronically Signed   By: Beckie Salts M.D.   On: 11/12/2015 16:42    Assessment & Plan:   There are no  diagnoses linked to this encounter. I am having Mr. Blaich maintain his aspirin, pantoprazole, cholecalciferol, cyanocobalamin, fish oil-omega-3 fatty acids, glucosamine-chondroitin, multivitamin, metoCLOPramide, triamcinolone cream, meloxicam, traMADol, fluticasone, predniSONE, and valACYclovir. We will continue to administer methylPREDNISolone acetate.  No orders of the defined types were placed in this encounter.     Follow-up: No Follow-up on file.  Sonda Primes, MD

## 2016-02-08 NOTE — Assessment & Plan Note (Signed)
3/17 - planning to see Dr Annette Stable

## 2016-02-08 NOTE — Assessment & Plan Note (Addendum)
Loose wt Tramadol prn  Potential benefits of a long term tramadol use as well as potential risks (i.e. addiction risk, apnea etc) and complications (i.e. Somnolence, constipation and others) were explained to the patient and were aknowledged.

## 2016-02-08 NOTE — Assessment & Plan Note (Signed)
On ASA 

## 2016-02-08 NOTE — Progress Notes (Signed)
Pre visit review using our clinic review tool, if applicable. No additional management support is needed unless otherwise documented below in the visit note. 

## 2016-02-08 NOTE — Assessment & Plan Note (Signed)
Protonix, Reglan

## 2016-02-10 DIAGNOSIS — Z01818 Encounter for other preprocedural examination: Secondary | ICD-10-CM | POA: Diagnosis not present

## 2016-02-10 DIAGNOSIS — A048 Other specified bacterial intestinal infections: Secondary | ICD-10-CM | POA: Diagnosis not present

## 2016-02-10 DIAGNOSIS — K2270 Barrett's esophagus without dysplasia: Secondary | ICD-10-CM | POA: Diagnosis not present

## 2016-02-17 DIAGNOSIS — A048 Other specified bacterial intestinal infections: Secondary | ICD-10-CM | POA: Diagnosis not present

## 2016-02-17 DIAGNOSIS — K5281 Eosinophilic gastritis or gastroenteritis: Secondary | ICD-10-CM | POA: Diagnosis not present

## 2016-02-17 DIAGNOSIS — K2270 Barrett's esophagus without dysplasia: Secondary | ICD-10-CM | POA: Diagnosis not present

## 2016-02-18 DIAGNOSIS — K2270 Barrett's esophagus without dysplasia: Secondary | ICD-10-CM | POA: Diagnosis not present

## 2016-02-18 DIAGNOSIS — Q398 Other congenital malformations of esophagus: Secondary | ICD-10-CM | POA: Diagnosis not present

## 2016-03-01 DIAGNOSIS — Z800 Family history of malignant neoplasm of digestive organs: Secondary | ICD-10-CM | POA: Diagnosis not present

## 2016-03-01 DIAGNOSIS — Z8371 Family history of colonic polyps: Secondary | ICD-10-CM | POA: Diagnosis not present

## 2016-03-01 DIAGNOSIS — K2270 Barrett's esophagus without dysplasia: Secondary | ICD-10-CM | POA: Diagnosis not present

## 2016-03-01 DIAGNOSIS — K219 Gastro-esophageal reflux disease without esophagitis: Secondary | ICD-10-CM | POA: Diagnosis not present

## 2016-03-19 ENCOUNTER — Other Ambulatory Visit: Payer: Self-pay | Admitting: Internal Medicine

## 2016-04-29 ENCOUNTER — Other Ambulatory Visit: Payer: Self-pay | Admitting: Internal Medicine

## 2016-06-09 ENCOUNTER — Encounter: Payer: Medicare Other | Admitting: Internal Medicine

## 2016-06-15 ENCOUNTER — Encounter: Payer: Self-pay | Admitting: Internal Medicine

## 2016-06-15 ENCOUNTER — Ambulatory Visit (INDEPENDENT_AMBULATORY_CARE_PROVIDER_SITE_OTHER): Payer: Medicare Other | Admitting: Internal Medicine

## 2016-06-15 VITALS — BP 118/72 | HR 76 | Ht 72.0 in | Wt 232.0 lb

## 2016-06-15 DIAGNOSIS — I2510 Atherosclerotic heart disease of native coronary artery without angina pectoris: Secondary | ICD-10-CM

## 2016-06-15 DIAGNOSIS — K2270 Barrett's esophagus without dysplasia: Secondary | ICD-10-CM

## 2016-06-15 DIAGNOSIS — M150 Primary generalized (osteo)arthritis: Secondary | ICD-10-CM | POA: Diagnosis not present

## 2016-06-15 DIAGNOSIS — E785 Hyperlipidemia, unspecified: Secondary | ICD-10-CM

## 2016-06-15 DIAGNOSIS — R739 Hyperglycemia, unspecified: Secondary | ICD-10-CM

## 2016-06-15 DIAGNOSIS — Z0000 Encounter for general adult medical examination without abnormal findings: Secondary | ICD-10-CM | POA: Diagnosis not present

## 2016-06-15 DIAGNOSIS — M159 Polyosteoarthritis, unspecified: Secondary | ICD-10-CM

## 2016-06-15 DIAGNOSIS — N320 Bladder-neck obstruction: Secondary | ICD-10-CM

## 2016-06-15 DIAGNOSIS — M5440 Lumbago with sciatica, unspecified side: Secondary | ICD-10-CM

## 2016-06-15 DIAGNOSIS — I2583 Coronary atherosclerosis due to lipid rich plaque: Secondary | ICD-10-CM

## 2016-06-15 MED ORDER — TRAMADOL HCL 50 MG PO TABS: ORAL_TABLET | ORAL | 3 refills | Status: DC

## 2016-06-15 NOTE — Patient Instructions (Signed)

## 2016-06-15 NOTE — Assessment & Plan Note (Signed)
Chronic and severe Tramadol  Potential benefits of a long term opioids use as well as potential risks (i.e. addiction risk, apnea etc) and complications (i.e. Somnolence, constipation and others) were explained to the patient and were aknowledged.

## 2016-06-15 NOTE — Assessment & Plan Note (Signed)
ere for medicare wellness/physical  Diet: heart healthy  Physical activity: not sedentary  Depression/mood screen: negative  Hearing: intact to whispered voice  Visual acuity: grossly normal, performs annual eye exam  ADLs: capable  Fall risk: mild Home safety: good  Cognitive evaluation: intact to orientation, naming, recall and repetition  EOL planning: adv directives, full code/ I agree  I have personally reviewed and have noted  1. The patient's medical, surgical and social history  2. Their use of alcohol, tobacco or illicit drugs  3. Their current medications and supplements  4. The patient's functional ability including ADL's, fall risks, home safety risks and hearing or visual impairment.  5. Diet and physical activities  6. Evidence for depression or mood disorders 7. The roster of all physicians providing medical care to patient - is listed in the Snapshot section of the chart and reviewed today.    Today patient counseled on age appropriate routine health concerns for screening and prevention, each reviewed and up to date or declined. Immunizations reviewed and up to date or declined. Labs ordered and reviewed. Risk factors for depression reviewed and negative. Hearing function and visual acuity are intact. ADLs screened and addressed as needed. Functional ability and level of safety reviewed and appropriate. Education, counseling and referrals performed based on assessed risks today. Patient provided with a copy of personalized plan for preventive services.  Colon Dr Ferdinand Lango 7/16

## 2016-06-15 NOTE — Assessment & Plan Note (Signed)
Labs Wt loss 

## 2016-06-15 NOTE — Progress Notes (Signed)
Subjective:  Patient ID: [name], male    DOB: 06/05/1951  Age: 65 y.o. MRN: [id number]  CC: No chief complaint on file.   HPI FLOID KETTERLING presents for a well exam.C/o OA, knee and back pain. Lost wt on good diet  Outpatient Medications Prior to Visit  Medication Sig Dispense Refill  . aspirin 81 MG tablet Take 81 mg by mouth at bedtime.     . cholecalciferol (VITAMIN D) 400 UNITS TABS Take 400 Units by mouth daily.    . cyanocobalamin 500 MCG tablet Take 1,000 mcg by mouth daily.     . fish oil-omega-3 fatty acids 1000 MG capsule Take 2 g by mouth daily.    . fluticasone (FLONASE) 50 MCG/ACT nasal spray USE ONE SPRAY(S) IN EACH NOSTRIL ONCE DAILY 16 g 1  . glucosamine-chondroitin 500-400 MG tablet Take 1 tablet by mouth 3 (three) times daily.    . meloxicam (MOBIC) 7.5 MG tablet TAKE ONE TO TWO TABLETS BY MOUTH ONCE DAILY 60 tablet 2  . metoCLOPramide (REGLAN) 10 MG tablet Take 10 mg by mouth 4 (four) times daily.    . Multiple Vitamin (MULTIVITAMIN) tablet Take 1 tablet by mouth daily.    . pantoprazole (PROTONIX) 40 MG tablet Take 40 mg by mouth daily.      . traMADol (ULTRAM) 50 MG tablet TAKE ONE TO TWO TABLETS BY MOUTH TWICE DAILY AS NEEDED FOR PAIN 120 tablet 3  . triamcinolone cream (KENALOG) 0.1 % Apply 1 application topically 2 (two) times daily. Use prn 45 g 3  . valACYclovir (VALTREX) 500 MG tablet TAKE ONE CAPLET BY MOUTH ONCE DAILY 90 tablet 0   Facility-Administered Medications Prior to Visit  Medication Dose Route Frequency Provider Last Rate Last Dose  . methylPREDNISolone acetate (DEPO-MEDROL) injection 40 mg  40 mg Intra-articular Once Cassandria Anger, MD        ROS Review of Systems  Constitutional: Negative for appetite change, fatigue and unexpected weight change.  HENT: Negative for congestion, nosebleeds, sneezing, sore throat and trouble swallowing.   Eyes: Negative for itching and visual disturbance.  Respiratory: Negative for cough.     Cardiovascular: Negative for chest pain, palpitations and leg swelling.  Gastrointestinal: Negative for abdominal distention, blood in stool, diarrhea and nausea.  Genitourinary: Positive for frequency. Negative for hematuria.  Musculoskeletal: Positive for arthralgias, back pain, gait problem and neck stiffness. Negative for joint swelling and neck pain.  Skin: Negative for rash.  Neurological: Negative for dizziness, tremors, speech difficulty and weakness.  Psychiatric/Behavioral: Negative for agitation, dysphoric mood and sleep disturbance. The patient is not nervous/anxious.     Objective:  BP 118/72   Pulse 76   Ht 6' (1.829 m)   Wt 232 lb (105.2 kg)   SpO2 95%   BMI 31.46 kg/m   BP Readings from Last 3 Encounters:  06/15/16 118/72  02/08/16 128/70  11/12/15 142/95    Wt Readings from Last 3 Encounters:  06/15/16 232 lb (105.2 kg)  02/08/16 239 lb (108.4 kg)  11/12/15 230 lb (104.3 kg)    Physical Exam  Constitutional: He is oriented to person, place, and time. He appears well-developed. No distress.  NAD  HENT:  Mouth/Throat: Oropharynx is clear and moist.  Eyes: Conjunctivae are normal. Pupils are equal, round, and reactive to light.  Neck: Normal range of motion. No JVD present. No thyromegaly present.  Cardiovascular: Normal rate, regular rhythm, normal heart sounds and intact distal pulses.  Exam  reveals no gallop and no friction rub.   No murmur heard. Pulmonary/Chest: Effort normal and breath sounds normal. No respiratory distress. He has no wheezes. He has no rales. He exhibits no tenderness.  Abdominal: Soft. Bowel sounds are normal. He exhibits no distension and no mass. There is no tenderness. There is no rebound and no guarding.  Genitourinary: Rectum normal and prostate normal.  Musculoskeletal: Normal range of motion. He exhibits tenderness. He exhibits no edema.  Lymphadenopathy:    He has no cervical adenopathy.  Neurological: He is alert and  oriented to person, place, and time. He has normal reflexes. No cranial nerve deficit. He exhibits normal muscle tone. He displays a negative Romberg sign. Coordination abnormal. Gait normal.  Skin: Skin is warm and dry. No rash noted.  Psychiatric: He has a normal mood and affect. His behavior is normal. Judgment and thought content normal.    Lab Results  Component Value Date   WBC 7.5 11/12/2015   HGB 14.2 11/12/2015   HCT 40.8 11/12/2015   PLT 282 11/12/2015   GLUCOSE 101 (H) 02/08/2016   CHOL 207 (H) 06/10/2015   TRIG 288.0 (H) 06/10/2015   HDL 37.50 (L) 06/10/2015   LDLDIRECT 102.0 06/10/2015   LDLCALC 137 (H) 05/02/2008   ALT 26 06/10/2015   AST 19 06/10/2015   NA 138 02/08/2016   K 4.3 02/08/2016   CL 104 02/08/2016   CREATININE 0.86 02/08/2016   BUN 19 02/08/2016   CO2 26 02/08/2016   TSH 3.98 06/10/2015   PSA 0.67 06/10/2015   INR 0.91 07/25/2012   HGBA1C 5.7 02/08/2016    Dg Chest 2 View  Result Date: 11/12/2015 CLINICAL DATA:  Chest pain EXAM: CHEST  2 VIEW COMPARISON:  07/16/2012 chest radiograph. FINDINGS: Stable cardiomediastinal silhouette with normal heart size. No pneumothorax. No pleural effusion. Clear lungs, with no focal lung consolidation and no pulmonary edema. Sclerotic nodular focus overlying the anterior right fifth rib is stable and correlates with a sclerotic bone lesion in the anterior right fifth rib on the 07/17/2012 cardiac CT study, in keeping with a benign bone island. IMPRESSION: No active cardiopulmonary disease. Electronically Signed   By: Ilona Sorrel M.D.   On: 11/12/2015 14:04   Ct Angio Chest Pe W/cm &/or Wo Cm  Result Date: 11/12/2015 CLINICAL DATA:  Mid and left chest pressure for the past few weeks. Cough. Elevated D-dimer. EXAM: CT ANGIOGRAPHY CHEST WITH CONTRAST TECHNIQUE: Multidetector CT imaging of the chest was performed using the standard protocol during bolus administration of intravenous contrast. Multiplanar CT image  reconstructions and MIPs were obtained to evaluate the vascular anatomy. CONTRAST:  122mL OMNIPAQUE IOHEXOL 350 MG/ML SOLN COMPARISON:  Chest radiographs obtained earlier today. Chest CT dated 07/17/2012. FINDINGS: Mediastinum/Lymph Nodes: No pulmonary emboli or thoracic aortic dissection identified. No masses or pathologically enlarged lymph nodes identified. Lungs/Pleura: The previously seen 3 mm right middle lobe nodule is slightly smaller and less dense. No new nodules. Mild bilateral dependent atelectasis. Upper abdomen: No acute findings. Musculoskeletal: Thoracic spine degenerative changes. Review of the MIP images confirms the above findings. IMPRESSION: 1. No pulmonary emboli. 2. Slight decrease in size of a 3 mm right middle lobe nodule, compatible with a benign process. Electronically Signed   By: Claudie Revering M.D.   On: 11/12/2015 16:42    Assessment & Plan:   There are no diagnoses linked to this encounter. I am having Mr. Brunken maintain his aspirin, pantoprazole, cholecalciferol, cyanocobalamin, fish oil-omega-3 fatty acids,  glucosamine-chondroitin, multivitamin, metoCLOPramide, triamcinolone cream, fluticasone, traMADol, meloxicam, and valACYclovir. We will continue to administer methylPREDNISolone acetate.  No orders of the defined types were placed in this encounter.    Follow-up: No Follow-up on file.  Walker Kehr, MD

## 2016-06-15 NOTE — Assessment & Plan Note (Signed)
F/u w/Dr Pool 

## 2016-06-15 NOTE — Assessment & Plan Note (Signed)
Statins discussed (he is on red rice yeast) - he declined Rx On ASA

## 2016-06-15 NOTE — Assessment & Plan Note (Signed)
Protonix, Reglan

## 2016-06-15 NOTE — Progress Notes (Signed)
Pre visit review using our clinic review tool, if applicable. No additional management support is needed unless otherwise documented below in the visit note. 

## 2016-06-16 ENCOUNTER — Other Ambulatory Visit (INDEPENDENT_AMBULATORY_CARE_PROVIDER_SITE_OTHER): Payer: Medicare Other

## 2016-06-16 ENCOUNTER — Other Ambulatory Visit: Payer: Self-pay | Admitting: Internal Medicine

## 2016-06-16 DIAGNOSIS — Z0000 Encounter for general adult medical examination without abnormal findings: Secondary | ICD-10-CM

## 2016-06-16 DIAGNOSIS — N320 Bladder-neck obstruction: Secondary | ICD-10-CM

## 2016-06-16 DIAGNOSIS — M150 Primary generalized (osteo)arthritis: Secondary | ICD-10-CM

## 2016-06-16 DIAGNOSIS — K2270 Barrett's esophagus without dysplasia: Secondary | ICD-10-CM | POA: Diagnosis not present

## 2016-06-16 DIAGNOSIS — R739 Hyperglycemia, unspecified: Secondary | ICD-10-CM

## 2016-06-16 DIAGNOSIS — E785 Hyperlipidemia, unspecified: Secondary | ICD-10-CM

## 2016-06-16 DIAGNOSIS — I2510 Atherosclerotic heart disease of native coronary artery without angina pectoris: Secondary | ICD-10-CM

## 2016-06-16 DIAGNOSIS — I2583 Coronary atherosclerosis due to lipid rich plaque: Secondary | ICD-10-CM

## 2016-06-16 DIAGNOSIS — M159 Polyosteoarthritis, unspecified: Secondary | ICD-10-CM

## 2016-06-16 DIAGNOSIS — M5440 Lumbago with sciatica, unspecified side: Secondary | ICD-10-CM

## 2016-06-16 LAB — CBC WITH DIFFERENTIAL/PLATELET
Basophils Absolute: 0.1 10*3/uL (ref 0.0–0.1)
Basophils Relative: 0.8 % (ref 0.0–3.0)
EOS PCT: 2.9 % (ref 0.0–5.0)
Eosinophils Absolute: 0.2 10*3/uL (ref 0.0–0.7)
HCT: 44.5 % (ref 39.0–52.0)
Hemoglobin: 15.1 g/dL (ref 13.0–17.0)
LYMPHS ABS: 3.2 10*3/uL (ref 0.7–4.0)
Lymphocytes Relative: 40.5 % (ref 12.0–46.0)
MCHC: 34 g/dL (ref 30.0–36.0)
MCV: 92.5 fl (ref 78.0–100.0)
MONO ABS: 1.2 10*3/uL — AB (ref 0.1–1.0)
Monocytes Relative: 15.7 % — ABNORMAL HIGH (ref 3.0–12.0)
NEUTROS ABS: 3.2 10*3/uL (ref 1.4–7.7)
NEUTROS PCT: 40.1 % — AB (ref 43.0–77.0)
PLATELETS: 227 10*3/uL (ref 150.0–400.0)
RBC: 4.81 Mil/uL (ref 4.22–5.81)
RDW: 13.6 % (ref 11.5–15.5)
WBC: 7.9 10*3/uL (ref 4.0–10.5)

## 2016-06-16 LAB — BASIC METABOLIC PANEL
BUN: 14 mg/dL (ref 6–23)
CALCIUM: 9.6 mg/dL (ref 8.4–10.5)
CO2: 25 mEq/L (ref 19–32)
CREATININE: 0.83 mg/dL (ref 0.40–1.50)
Chloride: 104 mEq/L (ref 96–112)
GFR: 99.47 mL/min (ref >60.00)
Glucose, Bld: 124 mg/dL — ABNORMAL HIGH (ref 70–99)
Potassium: 4.1 mEq/L (ref 3.5–5.1)
SODIUM: 138 meq/L (ref 135–145)

## 2016-06-16 LAB — URINALYSIS
BILIRUBIN URINE: NEGATIVE
Ketones, ur: NEGATIVE
LEUKOCYTES UA: NEGATIVE
NITRITE: NEGATIVE
PH: 6 (ref 5.0–8.0)
Specific Gravity, Urine: <=1.005 — AB (ref 1.000–1.030)
Total Protein, Urine: NEGATIVE
Urine Glucose: NEGATIVE
Urobilinogen, UA: 0.2 (ref 0.0–1.0)

## 2016-06-16 LAB — HEPATIC FUNCTION PANEL
ALT: 30 U/L (ref 0–53)
AST: 20 U/L (ref 0–37)
Albumin: 4.5 g/dL (ref 3.5–5.2)
Alkaline Phosphatase: 54 U/L (ref 39–117)
BILIRUBIN DIRECT: 0.1 mg/dL (ref 0.0–0.3)
BILIRUBIN TOTAL: 0.6 mg/dL (ref 0.2–1.2)
Total Protein: 7.6 g/dL (ref 6.0–8.3)

## 2016-06-16 LAB — LIPID PANEL
CHOL/HDL RATIO: 5
CHOLESTEROL: 222 mg/dL — AB (ref 0–200)
HDL: 41.6 mg/dL (ref >39.00)
LDL CALC: 147 mg/dL — AB (ref 0–99)
NonHDL: 180.63
TRIGLYCERIDES: 169 mg/dL — AB (ref 0.0–149.0)
VLDL: 33.8 mg/dL (ref 0.0–40.0)

## 2016-06-16 LAB — HEMOGLOBIN A1C: HEMOGLOBIN A1C: 5.4 % (ref 4.6–6.5)

## 2016-06-16 LAB — PSA: PSA: 0.72 ng/mL (ref 0.10–4.00)

## 2016-06-16 LAB — TSH: TSH: 2.26 u[IU]/mL (ref 0.35–4.50)

## 2016-06-16 MED ORDER — PRAVASTATIN SODIUM 20 MG PO TABS: 20.0000 mg | ORAL_TABLET | Freq: Every day | ORAL | 11 refills | Status: DC

## 2016-06-17 LAB — HEPATITIS C ANTIBODY: HCV AB: NEGATIVE

## 2016-06-18 ENCOUNTER — Other Ambulatory Visit: Payer: Self-pay | Admitting: Internal Medicine

## 2016-07-22 DIAGNOSIS — Z23 Encounter for immunization: Secondary | ICD-10-CM | POA: Diagnosis not present

## 2016-07-25 ENCOUNTER — Other Ambulatory Visit: Payer: Self-pay | Admitting: Internal Medicine

## 2016-07-26 ENCOUNTER — Telehealth: Payer: Self-pay | Admitting: Internal Medicine

## 2016-07-26 NOTE — Telephone Encounter (Signed)
OV w/any provider or ER Thx

## 2016-07-26 NOTE — Telephone Encounter (Signed)
PLEASE NOTE: All timestamps contained within this report are represented as [place] Standard Time. CONFIDENTIALTY NOTICE: This fax transmission is intended only for the addressee. It contains information that is legally privileged, confidential or otherwise protected from use or disclosure. If you are not the intended recipient, you are strictly prohibited from reviewing, disclosing, copying using or disseminating any of this information or taking any action in reliance on or regarding this information. If you have received this fax in error, please notify us immediately by telephone so that we can arrange for its return to [place]. Phone: [phone], Toll-Free: [phone], Fax: [phone] Page: 1 of 1 Call Id: [id number] [place] [facility] - Client [place] Patient Name: [name] DOB: Apr 12, 1951 Initial Comment Caller states he has been chest pain on and off the last couple of weeks. Nurse Assessment Nurse: Dimas Chyle, RN, Dellis Filbert Date/Time Eilene Ghazi Time): 07/26/2016 1:55:03 PM Confirm and document reason for call. If symptomatic, describe symptoms. You must click the next button to save text entered. ---Caller states he has been chest pain on and off the last couple of weeks. Has the patient traveled out of the country within the last 30 days? ---No Does the patient have any new or worsening symptoms? ---Yes Will a triage be completed? ---Yes Related visit to physician within the last 2 weeks? ---No Does the PT have any chronic conditions? (i.e. diabetes, asthma, etc.) ---No Is this a behavioral health or substance abuse call? ---No Guidelines Guideline Title Affirmed Question Affirmed Notes Chest Pain Pain also present in shoulder(s) or arm(s) or jaw (Exception: pain is clearly made worse by movement) Final Disposition User Go to ED Now Dimas Chyle, RN, [place] Spoke with office backline about caller refusing ED  outcome. They advised that they would be sending note to PCP. Referrals GO TO FACILITY REFUSED Disagree/Comply: Disagree Disagree/Comply Reason: Disagree with instructions

## 2016-07-27 ENCOUNTER — Other Ambulatory Visit: Payer: Self-pay | Admitting: Internal Medicine

## 2016-07-28 NOTE — Telephone Encounter (Signed)
Pt contacted and stated that he is feeling better. Pt stated that he had been to the ED several times for the same pain. ED stated that his pain in shoulder and chest was coming from bulging disc and stress.

## 2016-08-08 ENCOUNTER — Other Ambulatory Visit: Payer: Self-pay | Admitting: Family Medicine

## 2016-08-08 ENCOUNTER — Other Ambulatory Visit: Payer: Self-pay | Admitting: Internal Medicine

## 2016-08-09 NOTE — Telephone Encounter (Signed)
Refill done.  

## 2016-08-16 ENCOUNTER — Ambulatory Visit (INDEPENDENT_AMBULATORY_CARE_PROVIDER_SITE_OTHER): Payer: Medicare Other | Admitting: Internal Medicine

## 2016-08-16 ENCOUNTER — Encounter: Payer: Self-pay | Admitting: Internal Medicine

## 2016-08-16 DIAGNOSIS — L309 Dermatitis, unspecified: Secondary | ICD-10-CM

## 2016-08-16 DIAGNOSIS — K2270 Barrett's esophagus without dysplasia: Secondary | ICD-10-CM | POA: Diagnosis not present

## 2016-08-16 DIAGNOSIS — I2583 Coronary atherosclerosis due to lipid rich plaque: Secondary | ICD-10-CM

## 2016-08-16 DIAGNOSIS — I2510 Atherosclerotic heart disease of native coronary artery without angina pectoris: Secondary | ICD-10-CM

## 2016-08-16 DIAGNOSIS — J0101 Acute recurrent maxillary sinusitis: Secondary | ICD-10-CM | POA: Diagnosis not present

## 2016-08-16 MED ORDER — DOXYCYCLINE HYCLATE 100 MG PO CAPS
100.0000 mg | ORAL_CAPSULE | Freq: Two times a day (BID) | ORAL | 0 refills | Status: DC
Start: 2016-08-16 — End: 2016-12-16

## 2016-08-16 MED ORDER — TRIAMCINOLONE ACETONIDE 0.5 % EX OINT: 1.0000 "application " | TOPICAL_OINTMENT | Freq: Two times a day (BID) | CUTANEOUS | 2 refills | Status: DC

## 2016-08-16 MED ORDER — METHYLPREDNISOLONE ACETATE 80 MG/ML IJ SUSP
80.0000 mg | Freq: Once | INTRAMUSCULAR | Status: AC
  Administered 2016-08-16: 80 mg via INTRAMUSCULAR

## 2016-08-16 NOTE — Assessment & Plan Note (Signed)
Protonix, Reglan

## 2016-08-16 NOTE — Assessment & Plan Note (Signed)
Doxycycline po x 2 wks

## 2016-08-16 NOTE — Progress Notes (Addendum)
Subjective:  Patient ID: [name], male    DOB: October 15, 1951  Age: 65 y.o. MRN: [id number]  CC: Headache; Sore Throat; Rash (on bilateral hands); and Mouth Lesions (x 2-3 months painful)   HPI [name] presents for sinusitis sx's x 1-2 weeks. C/o worsening hand eczema . C/o blisters on hands filled w/pus. No new meds or soaps. He was camping in [place] in August - no tick bites. No fever  Outpatient Medications Prior to Visit  Medication Sig Dispense Refill  . aspirin 81 MG tablet Take 81 mg by mouth at bedtime.     . cholecalciferol (VITAMIN D) 400 UNITS TABS Take 400 Units by mouth daily.    . cyanocobalamin 500 MCG tablet Take 1,000 mcg by mouth daily.     . fish oil-omega-3 fatty acids 1000 MG capsule Take 2 g by mouth daily.    . fluticasone (FLONASE) 50 MCG/ACT nasal spray USE ONE SPRAY(S) IN EACH NOSTRIL ONCE DAILY 16 g 1  . glucosamine-chondroitin 500-400 MG tablet Take 1 tablet by mouth 3 (three) times daily.    . meloxicam (MOBIC) 7.5 MG tablet TAKE ONE TO TWO TABLETS BY MOUTH ONCE DAILY 60 tablet 3  . metoCLOPramide (REGLAN) 10 MG tablet Take 10 mg by mouth 4 (four) times daily.    . Multiple Vitamin (MULTIVITAMIN) tablet Take 1 tablet by mouth daily.    . pantoprazole (PROTONIX) 40 MG tablet Take 40 mg by mouth daily.      . pravastatin (PRAVACHOL) 20 MG tablet Take 1 tablet (20 mg total) by mouth daily. 30 tablet 11  . traMADol (ULTRAM) 50 MG tablet TAKE ONE TO TWO TABLETS BY MOUTH TWICE DAILY AS NEEDED FOR PAIN 120 tablet 3  . triamcinolone cream (KENALOG) 0.1 % APPLY  CREAM EXTERNALLY TWICE DAILY. USE AS NEEDED 45 g 3  . valACYclovir (VALTREX) 500 MG tablet TAKE ONE TABLET BY MOUTH ONCE DAILY 90 tablet 0  . valACYclovir (VALTREX) 500 MG tablet TAKE ONE TABLET BY MOUTH ONCE DAILY 90 tablet 0   Facility-Administered Medications Prior to Visit  Medication Dose Route Frequency Provider Last Rate Last Dose  . methylPREDNISolone acetate (DEPO-MEDROL) injection 40 mg  40  mg Intra-articular Once Cassandria Anger, MD        ROS Review of Systems  Constitutional: Negative for appetite change, fatigue and unexpected weight change.  HENT: Positive for congestion, ear pain, mouth sores and sore throat. Negative for nosebleeds, sneezing and trouble swallowing.   Eyes: Negative for itching and visual disturbance.  Respiratory: Negative for cough.   Cardiovascular: Negative for chest pain, palpitations and leg swelling.  Gastrointestinal: Negative for abdominal distention, blood in stool, diarrhea and nausea.  Genitourinary: Negative for frequency and hematuria.  Musculoskeletal: Negative for back pain, gait problem, joint swelling and neck pain.  Skin: Positive for rash.  Neurological: Negative for dizziness, tremors, speech difficulty and weakness.  Psychiatric/Behavioral: Negative for agitation, dysphoric mood and sleep disturbance. The patient is not nervous/anxious.     Objective:  BP 130/78   Pulse 79   Temp 97.9 F (36.6 C) (Oral)   Wt 231 lb (104.8 kg)   SpO2 98%   BMI 31.33 kg/m   BP Readings from Last 3 Encounters:  08/16/16 130/78  06/15/16 118/72  02/08/16 128/70    Wt Readings from Last 3 Encounters:  08/16/16 231 lb (104.8 kg)  06/15/16 232 lb (105.2 kg)  02/08/16 239 lb (108.4 kg)    Physical Exam  Constitutional: He is oriented to person, place, and time. He appears well-developed. No distress.  NAD  HENT:  Mouth/Throat: Oropharynx is clear and moist.  Eyes: Conjunctivae are normal. Pupils are equal, round, and reactive to light.  Neck: Normal range of motion. No JVD present. No thyromegaly present.  Cardiovascular: Normal rate, regular rhythm, normal heart sounds and intact distal pulses.  Exam reveals no gallop and no friction rub.   No murmur heard. Pulmonary/Chest: Effort normal and breath sounds normal. No respiratory distress. He has no wheezes. He has no rales. He exhibits no tenderness.  Abdominal: Soft. Bowel  sounds are normal. He exhibits no distension and no mass. There is no tenderness. There is no rebound and no guarding.  Musculoskeletal: Normal range of motion. He exhibits no edema or tenderness.  Lymphadenopathy:    He has no cervical adenopathy.  Neurological: He is alert and oriented to person, place, and time. He has normal reflexes. No cranial nerve deficit. He exhibits normal muscle tone. He displays a negative Romberg sign. Coordination and gait normal.  Skin: Skin is warm and dry. Rash noted.  Psychiatric: He has a normal mood and affect. His behavior is normal. Judgment and thought content normal.  eczema on B hands and 2-3 pustules on fingers 1 mm ulcer on inner lower lip eryth throat  Lab Results  Component Value Date   WBC 7.9 06/16/2016   HGB 15.1 06/16/2016   HCT 44.5 06/16/2016   PLT 227.0 06/16/2016   GLUCOSE 124 (H) 06/16/2016   CHOL 222 (H) 06/16/2016   TRIG 169.0 (H) 06/16/2016   HDL 41.60 06/16/2016   LDLDIRECT 102.0 06/10/2015   LDLCALC 147 (H) 06/16/2016   ALT 30 06/16/2016   AST 20 06/16/2016   NA 138 06/16/2016   K 4.1 06/16/2016   CL 104 06/16/2016   CREATININE 0.83 06/16/2016   BUN 14 06/16/2016   CO2 25 06/16/2016   TSH 2.26 06/16/2016   PSA 0.72 06/16/2016   INR 0.91 07/25/2012   HGBA1C 5.4 06/16/2016    Dg Chest 2 View  Result Date: 11/12/2015 CLINICAL DATA:  Chest pain EXAM: CHEST  2 VIEW COMPARISON:  07/16/2012 chest radiograph. FINDINGS: Stable cardiomediastinal silhouette with normal heart size. No pneumothorax. No pleural effusion. Clear lungs, with no focal lung consolidation and no pulmonary edema. Sclerotic nodular focus overlying the anterior right fifth rib is stable and correlates with a sclerotic bone lesion in the anterior right fifth rib on the 07/17/2012 cardiac CT study, in keeping with a benign bone island. IMPRESSION: No active cardiopulmonary disease. Electronically Signed   By: Ilona Sorrel M.D.   On: 11/12/2015 14:04   Ct  Angio Chest Pe W/cm &/or Wo Cm  Result Date: 11/12/2015 CLINICAL DATA:  Mid and left chest pressure for the past few weeks. Cough. Elevated D-dimer. EXAM: CT ANGIOGRAPHY CHEST WITH CONTRAST TECHNIQUE: Multidetector CT imaging of the chest was performed using the standard protocol during bolus administration of intravenous contrast. Multiplanar CT image reconstructions and MIPs were obtained to evaluate the vascular anatomy. CONTRAST:  19mL OMNIPAQUE IOHEXOL 350 MG/ML SOLN COMPARISON:  Chest radiographs obtained earlier today. Chest CT dated 07/17/2012. FINDINGS: Mediastinum/Lymph Nodes: No pulmonary emboli or thoracic aortic dissection identified. No masses or pathologically enlarged lymph nodes identified. Lungs/Pleura: The previously seen 3 mm right middle lobe nodule is slightly smaller and less dense. No new nodules. Mild bilateral dependent atelectasis. Upper abdomen: No acute findings. Musculoskeletal: Thoracic spine degenerative changes. Review of the MIP images  confirms the above findings. IMPRESSION: 1. No pulmonary emboli. 2. Slight decrease in size of a 3 mm right middle lobe nodule, compatible with a benign process. Electronically Signed   By: Claudie Revering M.D.   On: 11/12/2015 16:42    Assessment & Plan:   There are no diagnoses linked to this encounter. I am having Mr. Tiscareno maintain his aspirin, pantoprazole, cholecalciferol, cyanocobalamin, fish oil-omega-3 fatty acids, glucosamine-chondroitin, multivitamin, metoCLOPramide, traMADol, pravastatin, meloxicam, valACYclovir, valACYclovir, fluticasone, and triamcinolone cream. We will continue to administer methylPREDNISolone acetate.  No orders of the defined types were placed in this encounter.    Follow-up: No Follow-up on file.  Walker Kehr, MD

## 2016-08-16 NOTE — Assessment & Plan Note (Addendum)
Worse Depo-medrol 80 mg IM Triamc oint

## 2016-08-16 NOTE — Progress Notes (Signed)
Pre visit review using our clinic review tool, if applicable. No additional management support is needed unless otherwise documented below in the visit note. 

## 2016-08-23 DIAGNOSIS — Z8371 Family history of colonic polyps: Secondary | ICD-10-CM | POA: Diagnosis not present

## 2016-08-23 DIAGNOSIS — K2270 Barrett's esophagus without dysplasia: Secondary | ICD-10-CM | POA: Diagnosis not present

## 2016-08-23 DIAGNOSIS — K219 Gastro-esophageal reflux disease without esophagitis: Secondary | ICD-10-CM | POA: Diagnosis not present

## 2016-08-23 DIAGNOSIS — Z800 Family history of malignant neoplasm of digestive organs: Secondary | ICD-10-CM | POA: Diagnosis not present

## 2016-08-25 DIAGNOSIS — K449 Diaphragmatic hernia without obstruction or gangrene: Secondary | ICD-10-CM | POA: Diagnosis not present

## 2016-08-25 DIAGNOSIS — K222 Esophageal obstruction: Secondary | ICD-10-CM | POA: Diagnosis not present

## 2016-08-25 DIAGNOSIS — R1310 Dysphagia, unspecified: Secondary | ICD-10-CM | POA: Diagnosis not present

## 2016-08-25 DIAGNOSIS — K2270 Barrett's esophagus without dysplasia: Secondary | ICD-10-CM | POA: Diagnosis not present

## 2016-09-09 ENCOUNTER — Ambulatory Visit: Payer: Medicare Other | Admitting: Internal Medicine

## 2016-09-20 DIAGNOSIS — K2270 Barrett's esophagus without dysplasia: Secondary | ICD-10-CM | POA: Diagnosis not present

## 2016-09-20 DIAGNOSIS — R1310 Dysphagia, unspecified: Secondary | ICD-10-CM | POA: Diagnosis not present

## 2016-09-20 DIAGNOSIS — K219 Gastro-esophageal reflux disease without esophagitis: Secondary | ICD-10-CM | POA: Diagnosis not present

## 2016-09-26 ENCOUNTER — Emergency Department (HOSPITAL_BASED_OUTPATIENT_CLINIC_OR_DEPARTMENT_OTHER)
Admission: EM | Admit: 2016-09-26 | Discharge: 2016-09-26 | Disposition: A | Discharge status: Home / Self Care | Payer: Medicare Other | Attending: Emergency Medicine | Admitting: Emergency Medicine

## 2016-09-26 ENCOUNTER — Emergency Department (HOSPITAL_BASED_OUTPATIENT_CLINIC_OR_DEPARTMENT_OTHER): Payer: Medicare Other

## 2016-09-26 ENCOUNTER — Encounter (HOSPITAL_BASED_OUTPATIENT_CLINIC_OR_DEPARTMENT_OTHER): Payer: Self-pay | Admitting: Emergency Medicine

## 2016-09-26 DIAGNOSIS — K449 Diaphragmatic hernia without obstruction or gangrene: Secondary | ICD-10-CM | POA: Insufficient documentation

## 2016-09-26 DIAGNOSIS — R079 Chest pain, unspecified: Secondary | ICD-10-CM | POA: Diagnosis not present

## 2016-09-26 DIAGNOSIS — I2510 Atherosclerotic heart disease of native coronary artery without angina pectoris: Secondary | ICD-10-CM | POA: Diagnosis not present

## 2016-09-26 DIAGNOSIS — Z85828 Personal history of other malignant neoplasm of skin: Secondary | ICD-10-CM | POA: Insufficient documentation

## 2016-09-26 DIAGNOSIS — F17290 Nicotine dependence, other tobacco product, uncomplicated: Secondary | ICD-10-CM | POA: Diagnosis not present

## 2016-09-26 DIAGNOSIS — R61 Generalized hyperhidrosis: Secondary | ICD-10-CM | POA: Insufficient documentation

## 2016-09-26 DIAGNOSIS — R0789 Other chest pain: Secondary | ICD-10-CM | POA: Diagnosis not present

## 2016-09-26 DIAGNOSIS — Z7982 Long term (current) use of aspirin: Secondary | ICD-10-CM | POA: Diagnosis not present

## 2016-09-26 DIAGNOSIS — R1013 Epigastric pain: Secondary | ICD-10-CM | POA: Diagnosis present

## 2016-09-26 LAB — CBC
HCT: 42.3 % (ref 39.0–52.0)
HEMOGLOBIN: 14.5 g/dL (ref 13.0–17.0)
MCH: 31.7 pg (ref 26.0–34.0)
MCHC: 34.3 g/dL (ref 30.0–36.0)
MCV: 92.6 fL (ref 78.0–100.0)
PLATELETS: 220 10*3/uL (ref 150–400)
RBC: 4.57 MIL/uL (ref 4.22–5.81)
RDW: 12.5 % (ref 11.5–15.5)
WBC: 6.8 10*3/uL (ref 4.0–10.5)

## 2016-09-26 LAB — HEPATIC FUNCTION PANEL
ALT: 37 U/L (ref 17–63)
AST: 24 U/L (ref 15–41)
Albumin: 4.5 g/dL (ref 3.5–5.0)
Alkaline Phosphatase: 52 U/L (ref 38–126)
BILIRUBIN DIRECT: 0.1 mg/dL (ref 0.1–0.5)
BILIRUBIN TOTAL: 0.9 mg/dL (ref 0.3–1.2)
Indirect Bilirubin: 0.8 mg/dL (ref 0.3–0.9)
Total Protein: 7.6 g/dL (ref 6.5–8.1)

## 2016-09-26 LAB — BASIC METABOLIC PANEL
Anion gap: 7 (ref 5–15)
BUN: 26 mg/dL — ABNORMAL HIGH (ref 6–20)
CHLORIDE: 104 mmol/L (ref 101–111)
CO2: 26 mmol/L (ref 22–32)
CREATININE: 0.95 mg/dL (ref 0.61–1.24)
Calcium: 9.7 mg/dL (ref 8.9–10.3)
GFR calc non Af Amer: >60 mL/min (ref >60)
GLUCOSE: 110 mg/dL — AB (ref 65–99)
Potassium: 4.2 mmol/L (ref 3.5–5.1)
Sodium: 137 mmol/L (ref 135–145)

## 2016-09-26 LAB — TROPONIN I
Troponin I: <0.03 ng/mL (ref <0.03)
Troponin I: <0.03 ng/mL (ref <0.03)

## 2016-09-26 LAB — LIPASE, BLOOD: Lipase: 37 U/L (ref 11–51)

## 2016-09-26 MED ORDER — METOCLOPRAMIDE HCL 10 MG PO TABS: 10.0000 mg | ORAL_TABLET | Freq: Three times a day (TID) | ORAL | 0 refills | Status: DC | PRN

## 2016-09-26 MED ORDER — NITROGLYCERIN 0.4 MG SL SUBL: 0.4000 mg | SUBLINGUAL_TABLET | SUBLINGUAL | Status: DC | PRN

## 2016-09-26 MED ORDER — METOCLOPRAMIDE HCL 5 MG/ML IJ SOLN
10.0000 mg | Freq: Once | INTRAMUSCULAR | Status: AC
  Administered 2016-09-26: 10 mg via INTRAVENOUS
  Filled 2016-09-26: qty 2

## 2016-09-26 MED ORDER — SUCRALFATE 1 GM/10ML PO SUSP: 1.0000 g | Freq: Three times a day (TID) | ORAL | 0 refills | Status: DC

## 2016-09-26 MED ORDER — GI COCKTAIL ~~LOC~~
30.0000 mL | Freq: Once | ORAL | Status: AC
  Administered 2016-09-26: 30 mL via ORAL
  Filled 2016-09-26: qty 30

## 2016-09-26 MED ORDER — DIPHENHYDRAMINE HCL 50 MG/ML IJ SOLN
25.0000 mg | Freq: Once | INTRAMUSCULAR | Status: AC
  Administered 2016-09-26: 25 mg via INTRAVENOUS
  Filled 2016-09-26: qty 1

## 2016-09-26 NOTE — ED Notes (Signed)
Patient is alert and oriented x3.  He was given DC instructions and follow up visit instructions.  Patient gave verbal understanding.  He was DC ambulatory under his own power to home.  V/S stable.  He was not showing any signs of distress on DC 

## 2016-09-26 NOTE — ED Provider Notes (Signed)
[facility] Provider Note   CSN: [id number] Arrival date & time: 09/26/16  0809     History   Chief Complaint Chief Complaint  Patient presents with  . Chest Pain    HPI [name] is a 65 y.o. male.  HPI 65 yo M with PMHx as below here with chest pain. Pt states that he stopped taking his reglan from five times daily to none for the past 2 days. He felt well yesterday but then this morning, upon eating, noticed aching, dull, epigastric and left chest pain. The pain was associated with nausea and mild sweating but no vomiting. Pain felt similar to his usual "hernia" pain from hiatal hernia for which he has been evaluated extensively. Denies any SOB. Denies any fever, chills. No cough, hemoptysis, or pulmonary complaints. Pain made worse with lying flat and eating. Denies any alleviating factors. Pain does not seem worse with exertion.  Past Medical History:  Diagnosis Date  . Actinic keratosis   . Colonic polyp   . COLONIC POLYPS, HX OF   . CONTACT DERMATITIS DUE TO SOLVENTS   . DOE (dyspnea on exertion) 07-25-12   related to eating shrimp  . GERD (gastroesophageal reflux disease)   . HAND PAIN   . HERPES, GENITAL NOS   . Ingrowing toenail of left foot 07-25-12   is resolved now 07-25-12  . KNEE PAIN   . Neoplasm of uncertain behavior of skin   . OA (osteoarthritis) of knee   . ONYCHOMYCOSIS   . OSTEOARTHRITIS   . Preop exam for internal medicine   . SINUSITIS, ACUTE   . Well adult exam     Patient Active Problem List   Diagnosis Date Noted  . 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 Heart murmur 06/09/2015  . LBP (low back pain) 06/09/2015  . Fatigue 06/09/2015  . Hyperglycemia 06/09/2015  . Scrotal mass 06/09/2015  . Bladder neck obstruction 02/03/2015  . OSA (obstructive sleep apnea) 08/21/2013  . Sinusitis, chronic 08/12/2013  . Dyslipidemia 06/21/2013  . 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 Barrett esophagus 07/20/2012  . CAD (coronary artery  disease) 07/19/2012  . Ingrowing toenail of left foot 07/06/2012  . DOE (dyspnea on exertion) 06/18/2012  . Preop exam for internal medicine 06/18/2012  . Well adult exam 08/04/2011  . HAND PAIN 12/24/2010  . NEOPLASM OF UNCERTAIN BEHAVIOR OF SKIN 06/21/2010  . Acute sinusitis 06/21/2010  . ACTINIC KERATOSIS 06/21/2010  . CONTACT DERMATITIS DUE TO SOLVENTS 01/28/2010  . KNEE PAIN 12/21/2009  . Osteoarthritis 11/21/2008  . HERPES, GENITAL NOS 05/09/2008  . ONYCHOMYCOSIS 05/09/2008  . GERD 05/09/2008  . COLONIC POLYPS, HX OF 05/09/2008    Past Surgical History:  Procedure Laterality Date  . JOINT REPLACEMENT  9/13   L TKR  . KNEE ARTHROSCOPY     Left  . TOTAL KNEE ARTHROPLASTY  07/31/2012   Procedure: TOTAL KNEE ARTHROPLASTY;  Surgeon: Sydnee Cabal, MD;  Location: [facility];  Service: Orthopedics;  Laterality: Left;  . UMBILICAL HERNIA REPAIR  07-25-12   10 yrs ago       Home Medications    Prior to Admission medications   Medication Sig Start Date End Date Taking? Authorizing Provider  aspirin 81 MG tablet Take 81 mg by mouth at bedtime.     Historical Provider, MD  cholecalciferol (VITAMIN D) 400 UNITS TABS Take 400 Units by mouth daily.    Historical Provider, MD  cyanocobalamin 500 MCG tablet Take 1,000  mcg by mouth daily.     Historical Provider, MD  doxycycline (VIBRAMYCIN) 100 MG capsule Take 1 capsule (100 mg total) by mouth 2 (two) times daily. 08/16/16   Evie Lacks Plotnikov, MD  fish oil-omega-3 fatty acids 1000 MG capsule Take 2 g by mouth daily.    Historical Provider, MD  fluticasone (FLONASE) 50 MCG/ACT nasal spray USE ONE SPRAY(S) IN EACH NOSTRIL ONCE DAILY 08/09/16   Lyndal Pulley, DO  glucosamine-chondroitin 500-400 MG tablet Take 1 tablet by mouth 3 (three) times daily.    Historical Provider, MD  meloxicam (MOBIC) 7.5 MG tablet TAKE ONE TO TWO TABLETS BY MOUTH ONCE DAILY 06/20/16   Cassandria Anger, MD  metoCLOPramide (REGLAN) 10 MG tablet Take 1 tablet (10  mg total) by mouth every 8 (eight) hours as needed for nausea or vomiting. 09/26/16   Duffy Bruce, MD  Multiple Vitamin (MULTIVITAMIN) tablet Take 1 tablet by mouth daily.    Historical Provider, MD  pantoprazole (PROTONIX) 40 MG tablet Take 40 mg by mouth daily.      Historical Provider, MD  pravastatin (PRAVACHOL) 20 MG tablet Take 1 tablet (20 mg total) by mouth daily. 06/16/16 06/16/17  Evie Lacks Plotnikov, MD  sucralfate (CARAFATE) 1 GM/10ML suspension Take 10 mLs (1 g total) by mouth 4 (four) times daily -  with meals and at bedtime. 09/26/16   Duffy Bruce, MD  traMADol (ULTRAM) 50 MG tablet TAKE ONE TO TWO TABLETS BY MOUTH TWICE DAILY AS NEEDED FOR PAIN 06/15/16   Evie Lacks Plotnikov, MD  triamcinolone cream (KENALOG) 0.1 % APPLY  CREAM EXTERNALLY TWICE DAILY. USE AS NEEDED 08/08/16   Cassandria Anger, MD  triamcinolone ointment (KENALOG) 0.5 % Apply 1 application topically 2 (two) times daily. 08/16/16 08/16/17  Cassandria Anger, MD  valACYclovir (VALTREX) 500 MG tablet TAKE ONE TABLET BY MOUTH ONCE DAILY 07/25/16   Cassandria Anger, MD    Family History Family History  Problem Relation Age of Onset  . Diabetes Mother   . Heart disease Father 49    CAD, MI, CHF  . Coronary artery disease Other     Social History Social History  Substance Use Topics  . Smoking status: Current Some Day Smoker    Types: Cigars  . Smokeless tobacco: Never Used     Comment: occ. cigar  . Alcohol use Yes     Comment: occ. socially     Allergies   Shrimp [shellfish allergy]; Hytrin [terazosin]; Lipitor [atorvastatin]; and Penicillins   Review of Systems Review of Systems  Constitutional: Positive for diaphoresis. Negative for chills, fatigue and fever.  HENT: Negative for congestion and rhinorrhea.   Eyes: Negative for visual disturbance.  Respiratory: Positive for chest tightness. Negative for cough, shortness of breath and wheezing.   Cardiovascular: Positive for chest pain. Negative  for leg swelling.  Gastrointestinal: Positive for abdominal pain and nausea. Negative for diarrhea and vomiting.  Genitourinary: Negative for dysuria and flank pain.  Musculoskeletal: Negative for neck pain and neck stiffness.  Skin: Negative for rash and wound.  Allergic/Immunologic: Negative for immunocompromised state.  Neurological: Negative for syncope, weakness and headaches.  All other systems reviewed and are negative.    Physical Exam Updated Vital Signs BP 117/73 (BP Location: Left Arm)   Pulse 69   Temp 98.4 F (36.9 C) (Oral)   Resp 18   Ht 6' (1.829 m)   Wt 235 lb (106.6 kg)   SpO2 98%   BMI  31.87 kg/m   Physical Exam  Constitutional: He is oriented to person, place, and time. He appears well-developed and well-nourished. No distress.  HENT:  Head: Normocephalic and atraumatic.  Eyes: Conjunctivae are normal.  Neck: Neck supple.  Cardiovascular: Normal rate, regular rhythm and normal heart sounds.  Exam reveals no friction rub.   No murmur heard. Pulmonary/Chest: Effort normal and breath sounds normal. No respiratory distress. He has no wheezes. He has no rales.  Abdominal: Soft. Bowel sounds are normal. He exhibits no distension. There is tenderness (mild, epigastric). There is no rebound and no guarding.  Musculoskeletal: He exhibits no edema.  Neurological: He is alert and oriented to person, place, and time. He exhibits normal muscle tone.  Skin: Skin is warm. Capillary refill takes less than 2 seconds.  Psychiatric: He has a normal mood and affect.  Nursing note and vitals reviewed.    ED Treatments / Results  Labs (all labs ordered are listed, but only abnormal results are displayed) Labs Reviewed  BASIC METABOLIC PANEL - Abnormal; Notable for the following:       Result Value   Glucose, Bld 110 (*)    BUN 26 (*)    All other components within normal limits  CBC  TROPONIN I  HEPATIC FUNCTION PANEL  LIPASE, BLOOD  TROPONIN I    EKG  EKG  Interpretation  Date/Time:  Monday September 26 2016 08:17:44 EST Ventricular Rate:  84 PR Interval:    QRS Duration: 100 QT Interval:  370 QTC Calculation: 438 R Axis:   5 Text Interpretation:  Sinus rhythm Baseline wander in lead(s) II aVR No significant change since last tracing Confirmed by Symphany Fleissner MD, Lysbeth Galas [phone]) on 09/26/2016 8:21:26 AM Also confirmed by Ellender Hose MD, Shataya Winkles [phone]), editor [place], Joelene Millin [phone])  on 09/26/2016 8:42:21 AM       Radiology Dg Chest 2 View  Result Date: 09/26/2016 CLINICAL DATA:  Chest pain worsening this morning. Sweating and blurred vision. EXAM: CHEST  2 VIEW COMPARISON:  11/12/2015 CT chest FINDINGS: Stable scarring along the left hemidiaphragm. Stable sclerotic lesion in the left anterior fifth rib, unchanged from 07/17/2012. Cardiac and mediastinal margins appear normal. No pleural effusion. Mild lower thoracic spondylosis. IMPRESSION: 1.  No active cardiopulmonary disease is radiographically apparent. 2. Chronic scarring along the left hemidiaphragm. 3. Chronic benign sclerotic lesion anteriorly in the right fifth rib accounting for the density in this vicinity. Electronically Signed   By: Van Clines M.D.   On: 09/26/2016 08:44    Procedures Procedures (including critical care time)  Medications Ordered in ED Medications  gi cocktail (Maalox,Lidocaine,Donnatal) (30 mLs Oral Given 09/26/16 0937)  metoCLOPramide (REGLAN) injection 10 mg (10 mg Intravenous Given 09/26/16 0937)  diphenhydrAMINE (BENADRYL) injection 25 mg (25 mg Intravenous Given 09/26/16 0937)     Initial Impression / Assessment and Plan / ED Course  I have reviewed the triage vital signs and the nursing notes.  Pertinent labs & imaging results that were available during my care of the patient were reviewed by me and considered in my medical decision making (see chart for details).  Clinical Course     65 year old male with past medical history of chronic chest  pain and gastritis secondary to known hiatal hernia who presents with chest pain similar to his usual pain, associated with nausea and reported sweating. EKG shows no acute ST segment changes. Symptoms are improved with GI cocktail in the ED. Primary suspicion is likely acute on chronic GERD/gastritis secondary to  hiatal hernia, with possible nonbleeding ulcer. Differential includes ACS, although this is significantly less likely, patient has chronic similar pain, and EKG is nonischemic. He has a low risk heart score and will obtain delta troponin. Otherwise, will obtain screening labs including lipase and LFTs. No cough, shortness of breath, leg swelling, or signs of PE.  Patient requesting to be discharged as he left his crock pot on at home. I advised at least a delta troponin and second troponin remains undetectable. Labs are otherwise within normal limits and symptoms are improved. Given negative delta troponin, reassuring labs, and chronic pain, do not suspect acute emergent pathology. Will give a trial of Carafate, continue antacids, and outpatient follow-up.  Final Clinical Impressions(s) / ED Diagnoses   Final diagnoses:  Atypical chest pain  Hiatal hernia    New Prescriptions Discharge Medication List as of 09/26/2016 11:52 AM    START taking these medications   Details  sucralfate (CARAFATE) 1 GM/10ML suspension Take 10 mLs (1 g total) by mouth 4 (four) times daily -  with meals and at bedtime., Starting Mon 09/26/2016, Print         Duffy Bruce, MD 09/27/16 1135

## 2016-09-26 NOTE — ED Triage Notes (Signed)
Patient states that he has a Hiatal Hernia and being treated for such. Also reports that he has a pinched nerve in his neck. He is used to chest pain and numbness to his arms and hand as well as pain. Patient reports that he has had Lightheadedness and dizziness this am with some sweating

## 2016-10-17 ENCOUNTER — Other Ambulatory Visit: Payer: Self-pay | Admitting: Internal Medicine

## 2016-10-29 ENCOUNTER — Other Ambulatory Visit: Payer: Self-pay | Admitting: Internal Medicine

## 2016-11-01 ENCOUNTER — Other Ambulatory Visit: Payer: Self-pay | Admitting: Internal Medicine

## 2016-11-03 NOTE — Telephone Encounter (Signed)
Rec'd call pt states pharmacy sent request to have his Tramadol refilled 4 days ago he is currently out of meds. Pls advise if ok to refill.Marland KitchenJohny Chess

## 2016-11-03 NOTE — Telephone Encounter (Signed)
Rf phoned in to pt's pharmacy. See meds. PCP approved on 11/01/16. Pt informed

## 2016-11-15 ENCOUNTER — Other Ambulatory Visit: Payer: Self-pay | Admitting: Family Medicine

## 2016-11-15 NOTE — Telephone Encounter (Signed)
Refill done.  

## 2016-12-16 ENCOUNTER — Encounter: Payer: Self-pay | Admitting: Internal Medicine

## 2016-12-16 ENCOUNTER — Ambulatory Visit (INDEPENDENT_AMBULATORY_CARE_PROVIDER_SITE_OTHER): Payer: Medicare HMO | Admitting: Internal Medicine

## 2016-12-16 VITALS — BP 128/74 | HR 83 | Temp 98.5°F | Resp 18 | Ht 72.0 in | Wt 244.0 lb

## 2016-12-16 DIAGNOSIS — K2270 Barrett's esophagus without dysplasia: Secondary | ICD-10-CM | POA: Diagnosis not present

## 2016-12-16 DIAGNOSIS — I2510 Atherosclerotic heart disease of native coronary artery without angina pectoris: Secondary | ICD-10-CM

## 2016-12-16 DIAGNOSIS — M25561 Pain in right knee: Secondary | ICD-10-CM | POA: Diagnosis not present

## 2016-12-16 DIAGNOSIS — Z0000 Encounter for general adult medical examination without abnormal findings: Secondary | ICD-10-CM | POA: Diagnosis not present

## 2016-12-16 DIAGNOSIS — M25562 Pain in left knee: Secondary | ICD-10-CM

## 2016-12-16 DIAGNOSIS — I2583 Coronary atherosclerosis due to lipid rich plaque: Secondary | ICD-10-CM

## 2016-12-16 MED ORDER — OSELTAMIVIR PHOSPHATE 75 MG PO CAPS
75.0000 mg | ORAL_CAPSULE | Freq: Two times a day (BID) | ORAL | 0 refills | Status: DC
Start: 2016-12-16 — End: 2017-04-14

## 2016-12-16 MED ORDER — PRAVASTATIN SODIUM 20 MG PO TABS: 20.0000 mg | ORAL_TABLET | Freq: Every day | ORAL | 11 refills | Status: DC

## 2016-12-16 NOTE — Progress Notes (Signed)
Pre visit review using our clinic review tool, if applicable. No additional management support is needed unless otherwise documented below in the visit note. 

## 2016-12-16 NOTE — Assessment & Plan Note (Signed)
Tramadol prn 

## 2016-12-16 NOTE — Assessment & Plan Note (Addendum)
On ASA Will try to re-add Pravachol

## 2016-12-16 NOTE — Assessment & Plan Note (Signed)
Reglan Protonix CP in December 2017 req ER visit (due to taking too much Metoclopramide for GERD) f/u

## 2016-12-16 NOTE — Patient Instructions (Signed)
Try GLYTONE exfoliating body lotion by Pierre Fabre (free acid value 17.5)  

## 2016-12-16 NOTE — Progress Notes (Signed)
Subjective:  Patient ID: [name], male    DOB: 01/24/1951  Age: 66 y.o. MRN: [id number]  CC: No chief complaint on file.   HPI [name] presents for GERD, OA, CP in Nov req ER visit (due to taking too much Metoclopramide for GERD) f/u  Outpatient Medications Prior to Visit  Medication Sig Dispense Refill  . aspirin 81 MG tablet Take 81 mg by mouth at bedtime.     . cholecalciferol (VITAMIN D) 400 UNITS TABS Take 400 Units by mouth daily.    . cyanocobalamin 500 MCG tablet Take 1,000 mcg by mouth daily.     . fish oil-omega-3 fatty acids 1000 MG capsule Take 2 g by mouth daily.    . fluticasone (FLONASE) 50 MCG/ACT nasal spray USE ONE SPRAY(S) IN EACH NOSTRIL ONCE DAILY 16 g 0  . glucosamine-chondroitin 500-400 MG tablet Take 1 tablet by mouth 3 (three) times daily.    . meloxicam (MOBIC) 7.5 MG tablet TAKE ONE TO TWO TABLETS BY MOUTH ONCE DAILY 60 tablet 3  . metoCLOPramide (REGLAN) 10 MG tablet Take 1 tablet (10 mg total) by mouth every 8 (eight) hours as needed for nausea or vomiting. 30 tablet 0  . Multiple Vitamin (MULTIVITAMIN) tablet Take 1 tablet by mouth daily.    . pantoprazole (PROTONIX) 40 MG tablet Take 40 mg by mouth daily.      . pravastatin (PRAVACHOL) 20 MG tablet Take 1 tablet (20 mg total) by mouth daily. 30 tablet 11  . sucralfate (CARAFATE) 1 GM/10ML suspension Take 10 mLs (1 g total) by mouth 4 (four) times daily -  with meals and at bedtime. 420 mL 0  . traMADol (ULTRAM) 50 MG tablet TAKE ONE TO TWO TABLETS BY MOUTH TWICE DAILY AS NEEDED FOR PAIN 120 tablet 3  . triamcinolone ointment (KENALOG) 0.5 % Apply 1 application topically 2 (two) times daily. 60 g 2  . valACYclovir (VALTREX) 500 MG tablet TAKE ONE TABLET BY MOUTH ONCE DAILY 90 tablet 0  . doxycycline (VIBRAMYCIN) 100 MG capsule Take 1 capsule (100 mg total) by mouth 2 (two) times daily. (Patient not taking: Reported on 12/16/2016) 28 capsule 0  . triamcinolone cream (KENALOG) 0.1 % APPLY  CREAM  EXTERNALLY TWICE DAILY. USE AS NEEDED (Patient not taking: Reported on 12/16/2016) 45 g 3   Facility-Administered Medications Prior to Visit  Medication Dose Route Frequency Provider Last Rate Last Dose  . methylPREDNISolone acetate (DEPO-MEDROL) injection 40 mg  40 mg Intra-articular Once Kenzel Ruesch V, MD        ROS Review of Systems  Constitutional: Positive for unexpected weight change. Negative for appetite change and fatigue.  HENT: Negative for congestion, nosebleeds, sneezing, sore throat and trouble swallowing.   Eyes: Negative for itching and visual disturbance.  Respiratory: Negative for cough.   Cardiovascular: Negative for chest pain, palpitations and leg swelling.  Gastrointestinal: Negative for abdominal distention, blood in stool, diarrhea and nausea.  Genitourinary: Negative for frequency and hematuria.  Musculoskeletal: Positive for arthralgias and gait problem. Negative for back pain, joint swelling and neck pain.  Skin: Negative for rash.  Neurological: Negative for dizziness, tremors, speech difficulty and weakness.  Psychiatric/Behavioral: Negative for agitation, dysphoric mood and sleep disturbance. The patient is not nervous/anxious.     Objective:  BP 128/74   Pulse 83   Temp 98.5 F (36.9 C) (Oral)   Resp 18   Ht 6' (1.829 m)   Wt 244 lb (110.7 kg)  SpO2 95%   BMI 33.09 kg/m   BP Readings from Last 3 Encounters:  12/16/16 128/74  09/26/16 117/73  08/16/16 130/78    Wt Readings from Last 3 Encounters:  12/16/16 244 lb (110.7 kg)  09/26/16 235 lb (106.6 kg)  08/16/16 231 lb (104.8 kg)    Physical Exam  Constitutional: He is oriented to person, place, and time. He appears well-developed. No distress.  NAD  HENT:  Mouth/Throat: Oropharynx is clear and moist.  Eyes: Conjunctivae are normal. Pupils are equal, round, and reactive to light.  Neck: Normal range of motion. No JVD present. No thyromegaly present.  Cardiovascular: Normal rate,  regular rhythm, normal heart sounds and intact distal pulses.  Exam reveals no gallop and no friction rub.   No murmur heard. Pulmonary/Chest: Effort normal and breath sounds normal. No respiratory distress. He has no wheezes. He has no rales. He exhibits no tenderness.  Abdominal: Soft. Bowel sounds are normal. He exhibits no distension and no mass. There is no tenderness. There is no rebound and no guarding.  Musculoskeletal: Normal range of motion. He exhibits tenderness. He exhibits no edema.  Lymphadenopathy:    He has no cervical adenopathy.  Neurological: He is alert and oriented to person, place, and time. He has normal reflexes. No cranial nerve deficit. He exhibits normal muscle tone. He displays a negative Romberg sign. Coordination and gait normal.  Skin: Skin is warm and dry. No rash noted.  Psychiatric: He has a normal mood and affect. His behavior is normal. Judgment and thought content normal.  Obese  Lab Results  Component Value Date   WBC 6.8 09/26/2016   HGB 14.5 09/26/2016   HCT 42.3 09/26/2016   PLT 220 09/26/2016   GLUCOSE 110 (H) 09/26/2016   CHOL 222 (H) 06/16/2016   TRIG 169.0 (H) 06/16/2016   HDL 41.60 06/16/2016   LDLDIRECT 102.0 06/10/2015   LDLCALC 147 (H) 06/16/2016   ALT 37 09/26/2016   AST 24 09/26/2016   NA 137 09/26/2016   K 4.2 09/26/2016   CL 104 09/26/2016   CREATININE 0.95 09/26/2016   BUN 26 (H) 09/26/2016   CO2 26 09/26/2016   TSH 2.26 06/16/2016   PSA 0.72 06/16/2016   INR 0.91 07/25/2012   HGBA1C 5.4 06/16/2016    Dg Chest 2 View  Result Date: 09/26/2016 CLINICAL DATA:  Chest pain worsening this morning. Sweating and blurred vision. EXAM: CHEST  2 VIEW COMPARISON:  11/12/2015 CT chest FINDINGS: Stable scarring along the left hemidiaphragm. Stable sclerotic lesion in the left anterior fifth rib, unchanged from 07/17/2012. Cardiac and mediastinal margins appear normal. No pleural effusion. Mild lower thoracic spondylosis. IMPRESSION: 1.   No active cardiopulmonary disease is radiographically apparent. 2. Chronic scarring along the left hemidiaphragm. 3. Chronic benign sclerotic lesion anteriorly in the right fifth rib accounting for the density in this vicinity. Electronically Signed   By: Gaylyn Rong M.D.   On: 09/26/2016 08:44    Assessment & Plan:   There are no diagnoses linked to this encounter. I am having Mr. Rosie maintain his aspirin, pantoprazole, cholecalciferol, cyanocobalamin, fish oil-omega-3 fatty acids, glucosamine-chondroitin, multivitamin, pravastatin, valACYclovir, triamcinolone cream, doxycycline, triamcinolone ointment, metoCLOPramide, sucralfate, meloxicam, traMADol, and fluticasone. We will continue to administer methylPREDNISolone acetate.  No orders of the defined types were placed in this encounter.    Follow-up: No Follow-up on file.  Sonda Primes, MD

## 2016-12-23 DIAGNOSIS — R1310 Dysphagia, unspecified: Secondary | ICD-10-CM | POA: Diagnosis not present

## 2016-12-23 DIAGNOSIS — K2270 Barrett's esophagus without dysplasia: Secondary | ICD-10-CM | POA: Diagnosis not present

## 2016-12-23 DIAGNOSIS — K219 Gastro-esophageal reflux disease without esophagitis: Secondary | ICD-10-CM | POA: Diagnosis not present

## 2017-01-09 ENCOUNTER — Other Ambulatory Visit: Payer: Self-pay | Admitting: Family Medicine

## 2017-01-09 NOTE — Telephone Encounter (Signed)
Refill done.  

## 2017-02-20 ENCOUNTER — Other Ambulatory Visit: Payer: Self-pay | Admitting: Internal Medicine

## 2017-02-23 ENCOUNTER — Other Ambulatory Visit: Payer: Self-pay | Admitting: Internal Medicine

## 2017-02-24 ENCOUNTER — Other Ambulatory Visit: Payer: Self-pay | Admitting: Internal Medicine

## 2017-02-28 ENCOUNTER — Other Ambulatory Visit: Payer: Self-pay | Admitting: Internal Medicine

## 2017-02-28 NOTE — Telephone Encounter (Signed)
Tramadol called in and Valtrex sent

## 2017-02-28 NOTE — Telephone Encounter (Signed)
Please resend Tramadol and valACYclovir (VALTREX) 500 MG tablet   to [facility] on [address] in [place]

## 2017-03-01 DIAGNOSIS — K2270 Barrett's esophagus without dysplasia: Secondary | ICD-10-CM | POA: Diagnosis not present

## 2017-03-01 DIAGNOSIS — K219 Gastro-esophageal reflux disease without esophagitis: Secondary | ICD-10-CM | POA: Diagnosis not present

## 2017-03-01 DIAGNOSIS — R109 Unspecified abdominal pain: Secondary | ICD-10-CM | POA: Diagnosis not present

## 2017-03-01 DIAGNOSIS — R112 Nausea with vomiting, unspecified: Secondary | ICD-10-CM | POA: Diagnosis not present

## 2017-03-02 DIAGNOSIS — R1084 Generalized abdominal pain: Secondary | ICD-10-CM | POA: Diagnosis not present

## 2017-03-02 NOTE — Telephone Encounter (Signed)
Called refill into [facility] via my personal cellphone left MD approval on pharmacy vm...[name]

## 2017-03-03 DIAGNOSIS — K449 Diaphragmatic hernia without obstruction or gangrene: Secondary | ICD-10-CM | POA: Diagnosis not present

## 2017-03-03 DIAGNOSIS — K2270 Barrett's esophagus without dysplasia: Secondary | ICD-10-CM | POA: Diagnosis not present

## 2017-03-03 DIAGNOSIS — K859 Acute pancreatitis without necrosis or infection, unspecified: Secondary | ICD-10-CM | POA: Diagnosis not present

## 2017-03-03 DIAGNOSIS — K219 Gastro-esophageal reflux disease without esophagitis: Secondary | ICD-10-CM | POA: Diagnosis not present

## 2017-03-14 DIAGNOSIS — Z8371 Family history of colonic polyps: Secondary | ICD-10-CM | POA: Diagnosis not present

## 2017-03-14 DIAGNOSIS — K859 Acute pancreatitis without necrosis or infection, unspecified: Secondary | ICD-10-CM | POA: Diagnosis not present

## 2017-03-14 DIAGNOSIS — Z800 Family history of malignant neoplasm of digestive organs: Secondary | ICD-10-CM | POA: Diagnosis not present

## 2017-03-14 DIAGNOSIS — K2270 Barrett's esophagus without dysplasia: Secondary | ICD-10-CM | POA: Diagnosis not present

## 2017-04-12 ENCOUNTER — Telehealth: Payer: Self-pay | Admitting: Internal Medicine

## 2017-04-12 NOTE — Telephone Encounter (Signed)
Would like to know if Dr. Camila Li has received records from Dr. Ferdinand Lango with [facility].  Patient would like Dr. Camila Li to have these records before his upcoming appt.

## 2017-04-13 NOTE — Telephone Encounter (Signed)
I do not see anything scanned into chart or on my desk, have you seen anything?

## 2017-04-14 ENCOUNTER — Ambulatory Visit (INDEPENDENT_AMBULATORY_CARE_PROVIDER_SITE_OTHER): Payer: Medicare HMO | Admitting: Internal Medicine

## 2017-04-14 ENCOUNTER — Encounter: Payer: Self-pay | Admitting: Internal Medicine

## 2017-04-14 DIAGNOSIS — M2550 Pain in unspecified joint: Secondary | ICD-10-CM | POA: Insufficient documentation

## 2017-04-14 DIAGNOSIS — I2510 Atherosclerotic heart disease of native coronary artery without angina pectoris: Secondary | ICD-10-CM

## 2017-04-14 DIAGNOSIS — K2270 Barrett's esophagus without dysplasia: Secondary | ICD-10-CM

## 2017-04-14 DIAGNOSIS — K2100 Gastro-esophageal reflux disease with esophagitis, without bleeding: Secondary | ICD-10-CM

## 2017-04-14 HISTORY — DX: Pain in unspecified joint: M25.50

## 2017-04-14 NOTE — Assessment & Plan Note (Signed)
Hold Pravastatin x 2 wks

## 2017-04-14 NOTE — Patient Instructions (Addendum)
Hold Pravastatin x 2 weeks Stop Meloxicam, Red Rice yeast, Fish oil, multivitamin Try Turmeric for your joints

## 2017-04-14 NOTE — Assessment & Plan Note (Signed)
We discussed his GERD/Barrett's treated at [facility] (Dr Ferdinand Lango). Reglan was d/c'd due to CP. He was dx'd w/pancreatitis and started on Zenpep He had an abd CT, [place], EGD and colonoscopy. He would like to have a 2nd opinion here - will refer

## 2017-04-14 NOTE — Assessment & Plan Note (Addendum)
We discussed his GERD/Barrett's treated at [facility] (Dr Ferdinand Lango). Reglan was d/c'd due to CP. He was dx'd w/pancreatitis and started on Zenpep He had an abd CT, [place], EGD and colonoscopy. He would like to have a 2nd opinion here - will refer  Hold Pravastatin x 2 weeks Stop Meloxicam, Red Rice yeast, Fish oil

## 2017-04-14 NOTE — Assessment & Plan Note (Signed)
Pravastatin ASA 

## 2017-04-14 NOTE — Progress Notes (Signed)
Subjective:  Patient ID: [name], male    DOB: 1951-03-17  Age: 66 y.o. MRN: [id number]  CC: Nausea; Generalized Body Aches; and Abdominal Pain   HPI [name] presents for abd pain, CPs, GERD/Barrett's treated at [facility] (Dr Ferdinand Lango). Reglan was d/c'd due to CP. He was dx'd w/pancreatitis and started on Zenpep He had an abd CT, [place], EGD and colonoscopy. C/o Myalgia and arthralgias  Outpatient Medications Prior to Visit  Medication Sig Dispense Refill  . aspirin 81 MG tablet Take 81 mg by mouth at bedtime.     . cholecalciferol (VITAMIN D) 400 UNITS TABS Take 400 Units by mouth daily.    . cyanocobalamin 500 MCG tablet Take 1,000 mcg by mouth daily.     . fish oil-omega-3 fatty acids 1000 MG capsule Take 2 g by mouth daily.    . fluticasone (FLONASE) 50 MCG/ACT nasal spray USE ONE SPRAY(S) IN EACH NOSTRIL ONCE DAILY 16 g 1  . glucosamine-chondroitin 500-400 MG tablet Take 1 tablet by mouth 3 (three) times daily.    . meloxicam (MOBIC) 7.5 MG tablet TAKE ONE TO TWO TABLETS BY MOUTH ONCE DAILY 60 tablet 3  . metoCLOPramide (REGLAN) 10 MG tablet Take 1 tablet (10 mg total) by mouth every 8 (eight) hours as needed for nausea or vomiting. 30 tablet 0  . Multiple Vitamin (MULTIVITAMIN) tablet Take 1 tablet by mouth daily.    . pantoprazole (PROTONIX) 40 MG tablet Take 40 mg by mouth daily.      . pravastatin (PRAVACHOL) 20 MG tablet Take 1 tablet (20 mg total) by mouth daily. 30 tablet 11  . traMADol (ULTRAM) 50 MG tablet TAKE ONE TO TWO TABLETS BY MOUTH TWICE DAILY AS NEEDED FOR PAIN 120 tablet 3  . triamcinolone cream (KENALOG) 0.1 % APPLY  CREAM EXTERNALLY TWICE DAILY. USE AS NEEDED 45 g 3  . valACYclovir (VALTREX) 500 MG tablet TAKE ONE TABLET BY MOUTH ONCE DAILY 90 tablet 0  . valACYclovir (VALTREX) 500 MG tablet TAKE ONE TABLET BY MOUTH ONCE DAILY 90 tablet 0  . sucralfate (CARAFATE) 1 GM/10ML suspension Take 10 mLs (1 g total) by mouth 4 (four) times daily -  with  meals and at bedtime. (Patient not taking: Reported on 04/14/2017) 420 mL 0  . oseltamivir (TAMIFLU) 75 MG capsule Take 1 capsule (75 mg total) by mouth 2 (two) times daily. 10 capsule 0   Facility-Administered Medications Prior to Visit  Medication Dose Route Frequency Provider Last Rate Last Dose  . methylPREDNISolone acetate (DEPO-MEDROL) injection 40 mg  40 mg Intra-articular Once Plotnikov, Evie Lacks, MD        ROS Review of Systems  Constitutional: Positive for fatigue. Negative for appetite change and unexpected weight change.  HENT: Negative for congestion, nosebleeds, sneezing, sore throat and trouble swallowing.   Eyes: Negative for itching and visual disturbance.  Respiratory: Negative for cough, chest tightness and wheezing.   Cardiovascular: Negative for chest pain, palpitations and leg swelling.  Gastrointestinal: Positive for nausea. Negative for abdominal distention, blood in stool and diarrhea.       GERD  Genitourinary: Negative for frequency, hematuria and urgency.  Musculoskeletal: Positive for arthralgias. Negative for back pain, gait problem, joint swelling and neck pain.  Skin: Negative for rash.  Neurological: Negative for dizziness, tremors, speech difficulty and weakness.  Psychiatric/Behavioral: Negative for agitation, dysphoric mood and sleep disturbance. The patient is not nervous/anxious.     Objective:  BP 140/72 (BP  Location: Left Arm, Patient Position: Sitting, Cuff Size: Normal)   Pulse 83   Temp 97.7 F (36.5 C) (Oral)   Ht 6' (1.829 m)   Wt 236 lb (107 kg)   SpO2 98%   BMI 32.01 kg/m   BP Readings from Last 3 Encounters:  04/14/17 140/72  12/16/16 128/74  09/26/16 117/73    Wt Readings from Last 3 Encounters:  04/14/17 236 lb (107 kg)  12/16/16 244 lb (110.7 kg)  09/26/16 235 lb (106.6 kg)    Physical Exam  Constitutional: He is oriented to person, place, and time. He appears well-developed. No distress.  NAD  HENT:  Mouth/Throat:  Oropharynx is clear and moist.  Eyes: Conjunctivae are normal. Pupils are equal, round, and reactive to light.  Neck: Normal range of motion. No JVD present. No thyromegaly present.  Cardiovascular: Normal rate, regular rhythm, normal heart sounds and intact distal pulses.  Exam reveals no gallop and no friction rub.   No murmur heard. Pulmonary/Chest: Effort normal and breath sounds normal. No respiratory distress. He has no wheezes. He has no rales. He exhibits no tenderness.  Abdominal: Soft. Bowel sounds are normal. He exhibits no distension and no mass. There is no tenderness. There is no rebound and no guarding.  Musculoskeletal: Normal range of motion. He exhibits no edema or tenderness.  Lymphadenopathy:    He has no cervical adenopathy.  Neurological: He is alert and oriented to person, place, and time. He has normal reflexes. No cranial nerve deficit. He exhibits normal muscle tone. He displays a negative Romberg sign. Coordination and gait normal.  Skin: Skin is warm and dry. No rash noted.  Psychiatric: He has a normal mood and affect. His behavior is normal. Judgment and thought content normal.  obese  Lab Results  Component Value Date   WBC 6.8 09/26/2016   HGB 14.5 09/26/2016   HCT 42.3 09/26/2016   PLT 220 09/26/2016   GLUCOSE 110 (H) 09/26/2016   CHOL 222 (H) 06/16/2016   TRIG 169.0 (H) 06/16/2016   HDL 41.60 06/16/2016   LDLDIRECT 102.0 06/10/2015   LDLCALC 147 (H) 06/16/2016   ALT 37 09/26/2016   AST 24 09/26/2016   NA 137 09/26/2016   K 4.2 09/26/2016   CL 104 09/26/2016   CREATININE 0.95 09/26/2016   BUN 26 (H) 09/26/2016   CO2 26 09/26/2016   TSH 2.26 06/16/2016   PSA 0.72 06/16/2016   INR 0.91 07/25/2012   HGBA1C 5.4 06/16/2016    Dg Chest 2 View  Result Date: 09/26/2016 CLINICAL DATA:  Chest pain worsening this morning. Sweating and blurred vision. EXAM: CHEST  2 VIEW COMPARISON:  11/12/2015 CT chest FINDINGS: Stable scarring along the left  hemidiaphragm. Stable sclerotic lesion in the left anterior fifth rib, unchanged from 07/17/2012. Cardiac and mediastinal margins appear normal. No pleural effusion. Mild lower thoracic spondylosis. IMPRESSION: 1.  No active cardiopulmonary disease is radiographically apparent. 2. Chronic scarring along the left hemidiaphragm. 3. Chronic benign sclerotic lesion anteriorly in the right fifth rib accounting for the density in this vicinity. Electronically Signed   By: Van Clines M.D.   On: 09/26/2016 08:44    Assessment & Plan:   There are no diagnoses linked to this encounter. I have discontinued Mr. Sivley oseltamivir. I am also having him maintain his aspirin, pantoprazole, cholecalciferol, cyanocobalamin, fish oil-omega-3 fatty acids, glucosamine-chondroitin, multivitamin, valACYclovir, triamcinolone cream, metoCLOPramide, sucralfate, pravastatin, fluticasone, meloxicam, valACYclovir, and traMADol. We will continue to administer methylPREDNISolone acetate.  No orders of the defined types were placed in this encounter.    Follow-up: No Follow-up on file.  Walker Kehr, MD

## 2017-04-18 NOTE — Telephone Encounter (Signed)
Pt called in again to see if records have come in yet?  Have you seen them yet?

## 2017-04-19 NOTE — Telephone Encounter (Signed)
LM notifying pt that I do not believe we have received anything from them and gave back fax number for them to resend them to

## 2017-04-24 ENCOUNTER — Telehealth: Payer: Self-pay | Admitting: Internal Medicine

## 2017-04-24 NOTE — Telephone Encounter (Signed)
Rec'd from [facility] forwarded 162 pages to Dr. Lew Dawes

## 2017-04-26 ENCOUNTER — Telehealth: Payer: Self-pay | Admitting: Internal Medicine

## 2017-04-26 NOTE — Telephone Encounter (Signed)
Had records sent from [facility]. Would like to see Dr Henrene Pastor for a 2nd opinion.

## 2017-05-16 NOTE — Telephone Encounter (Signed)
Records have arrived on a disk. 211 pages have been printed and placed on Dr Blanch Media desk for review.

## 2017-05-16 NOTE — Telephone Encounter (Signed)
Being reviewed by Dr. Henrene Pastor

## 2017-05-22 ENCOUNTER — Telehealth: Payer: Self-pay

## 2017-05-22 ENCOUNTER — Encounter: Payer: Self-pay | Admitting: Internal Medicine

## 2017-05-22 NOTE — Telephone Encounter (Signed)
Okay for routine new patient appointment. Please save voluminous records

## 2017-05-22 NOTE — Telephone Encounter (Signed)
Please advise....  Spoke with patient about his wish to have a second opinion +(the 211 page chart).  Patient has a history of Gerd, Pancreatitis, Barretts Esophagus, and abdominal pain.  Has evidently had "stomach problems" for years.  Patient feels that his issues are not resolving and he is not getting much explanation from his doctor.  His main problem at this point seems to be nausea.  He was recently given samples of both Zenpep and then Creon with no explanation regarding their purpose and goal.  He feels as if his doctor changes his medications frequently without a clear explanation.  He feels that his doctor is getting a little older and he wants to talk to someone "more knowledgeable".  Dr. Alain Marion recommended Dr. Henrene Pastor so that is why he is requesting to see him.

## 2017-05-22 NOTE — Telephone Encounter (Signed)
[name] will call patient and schedule office visit.

## 2017-06-12 ENCOUNTER — Other Ambulatory Visit: Payer: Self-pay | Admitting: Internal Medicine

## 2017-06-15 ENCOUNTER — Other Ambulatory Visit: Payer: Self-pay | Admitting: Internal Medicine

## 2017-06-16 ENCOUNTER — Encounter: Payer: Medicare HMO | Admitting: Internal Medicine

## 2017-07-10 ENCOUNTER — Encounter: Payer: Self-pay | Admitting: Internal Medicine

## 2017-07-10 ENCOUNTER — Encounter (INDEPENDENT_AMBULATORY_CARE_PROVIDER_SITE_OTHER): Payer: Self-pay

## 2017-07-10 ENCOUNTER — Ambulatory Visit (INDEPENDENT_AMBULATORY_CARE_PROVIDER_SITE_OTHER): Payer: Medicare HMO | Admitting: Internal Medicine

## 2017-07-10 VITALS — BP 144/80 | HR 140 | Wt 223.0 lb

## 2017-07-10 DIAGNOSIS — R110 Nausea: Secondary | ICD-10-CM | POA: Diagnosis not present

## 2017-07-10 DIAGNOSIS — K2270 Barrett's esophagus without dysplasia: Secondary | ICD-10-CM | POA: Diagnosis not present

## 2017-07-10 DIAGNOSIS — K219 Gastro-esophageal reflux disease without esophagitis: Secondary | ICD-10-CM | POA: Diagnosis not present

## 2017-07-10 DIAGNOSIS — R748 Abnormal levels of other serum enzymes: Secondary | ICD-10-CM

## 2017-07-10 DIAGNOSIS — R109 Unspecified abdominal pain: Secondary | ICD-10-CM

## 2017-07-10 MED ORDER — ONDANSETRON HCL 4 MG PO TABS: ORAL_TABLET | ORAL | 3 refills | Status: DC

## 2017-07-10 NOTE — Patient Instructions (Signed)
We have sent the following medications to your pharmacy for you to pick up at your convenience:  Zofran  Please follow up as needed

## 2017-07-10 NOTE — Progress Notes (Signed)
HISTORY OF PRESENT ILLNESS:  [name] is a 66 y.o. male with past medical history as listed below. He is referred today by his primary care provider Dr. Alain Marion as the patient has requested a second opinion regarding a multitude of GI issues. Patient was previously cared for at [facility]  by Dr. Ferdinand Lango. I have done a cursory review of 211 pages of records from June 2012 through May 2018. This took approximately 2 hours. The patient's GI history is remarkable for GERD with Barrett's esophagus and peptic stricture requiring dilation. Also problems with chronic nausea and abdominal pain. Family history of colon cancer with multiple colonoscopies. Apparently diagnosed with gastroparesis for which she was placed on metoclopramide and had significant side effects on 4 times daily dosage. Question of Helicobacter pylori though most recent objective evaluation is negative. Finally, question of pancreatitis for which she was placed on pancreatic enzymes. He doesn't notice that these have changed any of his symptom complex. They are expensive. As a form of summary using highlights from his history, he had upper endoscopy in 2012 with Dr. Erskine Emery showing esophageal stricture. No mention of Barrett's. He had upper endoscopy in 2014 and 2014 with evidence of Barrett's. Negative gastric biopsies. Last EGD March 2017 evidence of Barrett's 6 cm. Otherwise negative esophageal biopsies. Negative gastric biopsies including H. pylori. Last colonoscopy June 2016. Said they have poor prep. 3 hyperplastic polyps removed. Follow-up in 3 years recommended. Complaints of abdominal pain in 2012. Negative ultrasound. Normal laboratories including amylase and lipase. Complained of abdominal pain in April 2018. Normal CBC. Essentially normal, perhaps a metabolic panel. Lipase elevated at 143 (upper limit of normal 82). CT scan and ultrasound that time were negative except for incidental renal cysts. Normal pancreas.  Brother with a history of colon cancer. Patient has smoked marijuana. Today he has a multitude of complaints. The only active GI complaints that I can ascertain his nausea for which he tells me he is now taking metoclopramide twice daily with good results. Does have some belching and bloating. Some trouble swallowing but not severe. He is a rambling historian without focus  REVIEW OF SYSTEMS:  All non-GI ROS negative except for arthritis, back pain, cough, fatigue, muscle cramps, heart murmur, skin rash, sore throat, excessive urination  Past Medical History:  Diagnosis Date  . Actinic keratosis   . Colonic polyp   . COLONIC POLYPS, HX OF   . CONTACT DERMATITIS DUE TO SOLVENTS   . DOE (dyspnea on exertion) 07-25-12   related to eating shrimp  . GERD (gastroesophageal reflux disease)   . HAND PAIN   . HERPES, GENITAL NOS   . Ingrowing toenail of left foot 07-25-12   is resolved now 07-25-12  . KNEE PAIN   . Neoplasm of uncertain behavior of skin   . OA (osteoarthritis) of knee   . ONYCHOMYCOSIS   . OSTEOARTHRITIS   . Preop exam for internal medicine   . SINUSITIS, ACUTE   . Well adult exam     Past Surgical History:  Procedure Laterality Date  . JOINT REPLACEMENT  9/13   L TKR  . KNEE ARTHROSCOPY     Left  . TOTAL KNEE ARTHROPLASTY  07/31/2012   Procedure: TOTAL KNEE ARTHROPLASTY;  Surgeon: Sydnee Cabal, MD;  Location: [facility];  Service: Orthopedics;  Laterality: Left;  . UMBILICAL HERNIA REPAIR  07-25-12   10 yrs ago    Social History BUREN HAVEY  reports that he has been  smoking Cigars.  He has never used smokeless tobacco. He reports that he drinks alcohol. He reports that he uses drugs, including Marijuana.  family history includes Colon polyps in his brother; Coronary artery disease in his other; Diabetes in his mother; Heart disease (age of onset: 107) in his father; Lung cancer in his brother.  Allergies  Allergen Reactions  . Shrimp [Shellfish Allergy] Shortness  Of Breath  . Hytrin [Terazosin] Other (See Comments)    "Makes me jittery"  . Lipitor [Atorvastatin]     myalgias  . Penicillins Other (See Comments)    Convulsions  . Reglan [Metoclopramide]     jerking       PHYSICAL EXAMINATION: Vital signs: BP (!) 144/80   Pulse (!) 140   Wt 223 lb (101.2 kg)   BMI 30.24 kg/m   Constitutional: generally well-appearing, no acute distress Psychiatric: alert and oriented x3, cooperative. Pressure speech and somewhat anxious Eyes: extraocular movements intact, anicteric, conjunctiva pink Mouth: oral pharynx moist, no lesions Neck: supple without thyromegaly Lymph: no lymphadenopathy Cardiovascular: heart regular rate and rhythm, no murmur Lungs: clear to auscultation bilaterally Abdomen: soft, obese, nontender, nondistended, no obvious ascites, no peritoneal signs, normal bowel sounds, no organomegaly Rectal: Omitted Extremities: no clubbing cyanosis or lower extremity edema bilaterally Skin: no lesions on visible extremities Neuro: No focal deficits. Cranial nerves intact. Normal DTRs. No asterixis.   ASSESSMENT:  1. Chronic GERD. On PPI 2. Esophageal stricture. Previously dilated. Some dysphagia now, but not bad 3. There is esophagus on biopsies without dysplasia. Last examination March 2017 4. Query pancreatitis. I cannot make the diagnosis of pancreatitis based on a minimally elevated lipase. Imaging negative. No indication for pancreatic enzymes 5. Query gastroparesis. No formal documentation 6. Family history of colon cancer. Last colonoscopy 2016 with hyperplastic polyps and poor prep 7. Obesity 8. Negative testing for Helicobacter pylori 9. Multiple non-GI complaints   PLAN:  1. Reflux precautions 2. Weight loss 3. Prescribe Zofran as needed for nausea 4. Stop pancreatic enzymes 5. Schedule solid-phase gastric empty scan to rule out gastroparesis 6. Discouraged chronic use of metoclopramide secondary to potential  neurologic side effects. He has been well informed 7. Surveillance EGD around March 2020. Recall made 8. Repeat colonoscopy around June 2019. Recall made 9. Routine GI follow-up in 6 months 10. Resume general medical care with Dr. Alain Marion  One hour plus spent face-to-face with the patient. Greater than 50% a time use for counseling regarding his multitude of GI conditions, my impressions, and coordination of care plan.

## 2017-07-11 DIAGNOSIS — J410 Simple chronic bronchitis: Secondary | ICD-10-CM | POA: Diagnosis not present

## 2017-07-11 DIAGNOSIS — J209 Acute bronchitis, unspecified: Secondary | ICD-10-CM | POA: Diagnosis not present

## 2017-07-11 DIAGNOSIS — J0100 Acute maxillary sinusitis, unspecified: Secondary | ICD-10-CM | POA: Diagnosis not present

## 2017-07-12 ENCOUNTER — Other Ambulatory Visit (INDEPENDENT_AMBULATORY_CARE_PROVIDER_SITE_OTHER): Payer: Medicare HMO

## 2017-07-12 ENCOUNTER — Other Ambulatory Visit: Payer: Self-pay | Admitting: Internal Medicine

## 2017-07-12 DIAGNOSIS — I2510 Atherosclerotic heart disease of native coronary artery without angina pectoris: Secondary | ICD-10-CM | POA: Diagnosis not present

## 2017-07-12 DIAGNOSIS — Z0000 Encounter for general adult medical examination without abnormal findings: Secondary | ICD-10-CM | POA: Diagnosis not present

## 2017-07-12 DIAGNOSIS — I2583 Coronary atherosclerosis due to lipid rich plaque: Secondary | ICD-10-CM

## 2017-07-12 LAB — CBC WITH DIFFERENTIAL/PLATELET
BASOS PCT: 1.2 % (ref 0.0–3.0)
Basophils Absolute: 0.1 10*3/uL (ref 0.0–0.1)
EOS ABS: 0.1 10*3/uL (ref 0.0–0.7)
Eosinophils Relative: 1.2 % (ref 0.0–5.0)
HCT: 45.2 % (ref 39.0–52.0)
HEMOGLOBIN: 14.9 g/dL (ref 13.0–17.0)
LYMPHS ABS: 3.4 10*3/uL (ref 0.7–4.0)
Lymphocytes Relative: 27.4 % (ref 12.0–46.0)
MCHC: 32.9 g/dL (ref 30.0–36.0)
MCV: 94.8 fl (ref 78.0–100.0)
MONO ABS: 1.4 10*3/uL — AB (ref 0.1–1.0)
Monocytes Relative: 11.3 % (ref 3.0–12.0)
NEUTROS PCT: 58.9 % (ref 43.0–77.0)
Neutro Abs: 7.3 10*3/uL (ref 1.4–7.7)
PLATELETS: 217 10*3/uL (ref 150.0–400.0)
RBC: 4.77 Mil/uL (ref 4.22–5.81)
RDW: 14.1 % (ref 11.5–15.5)
WBC: 12.5 10*3/uL — AB (ref 4.0–10.5)

## 2017-07-12 LAB — HEPATIC FUNCTION PANEL
ALT: 29 U/L (ref 0–53)
AST: 18 U/L (ref 0–37)
Albumin: 4.5 g/dL (ref 3.5–5.2)
Alkaline Phosphatase: 56 U/L (ref 39–117)
BILIRUBIN DIRECT: 0.1 mg/dL (ref 0.0–0.3)
BILIRUBIN TOTAL: 0.8 mg/dL (ref 0.2–1.2)
Total Protein: 7.3 g/dL (ref 6.0–8.3)

## 2017-07-12 LAB — LIPID PANEL
CHOL/HDL RATIO: 6
Cholesterol: 225 mg/dL — ABNORMAL HIGH (ref 0–200)
HDL: 38.8 mg/dL — AB (ref >39.00)
LDL CALC: 154 mg/dL — AB (ref 0–99)
NonHDL: 185.86
TRIGLYCERIDES: 159 mg/dL — AB (ref 0.0–149.0)
VLDL: 31.8 mg/dL (ref 0.0–40.0)

## 2017-07-12 LAB — BASIC METABOLIC PANEL
BUN: 19 mg/dL (ref 6–23)
CHLORIDE: 104 meq/L (ref 96–112)
CO2: 28 mEq/L (ref 19–32)
CREATININE: 0.85 mg/dL (ref 0.40–1.50)
Calcium: 9.8 mg/dL (ref 8.4–10.5)
GFR: 96.44 mL/min (ref >60.00)
Glucose, Bld: 110 mg/dL — ABNORMAL HIGH (ref 70–99)
Potassium: 4 mEq/L (ref 3.5–5.1)
Sodium: 140 mEq/L (ref 135–145)

## 2017-07-12 LAB — URINALYSIS, ROUTINE W REFLEX MICROSCOPIC
BILIRUBIN URINE: NEGATIVE
KETONES UR: NEGATIVE
LEUKOCYTES UA: NEGATIVE
Nitrite: NEGATIVE
RBC / HPF: NONE SEEN (ref 0–?)
Specific Gravity, Urine: 1.01 (ref 1.000–1.030)
Total Protein, Urine: NEGATIVE
UROBILINOGEN UA: 0.2 (ref 0.0–1.0)
Urine Glucose: NEGATIVE
WBC UA: NONE SEEN (ref 0–?)
pH: 6 (ref 5.0–8.0)

## 2017-07-12 LAB — TSH: TSH: 1.42 u[IU]/mL (ref 0.35–4.50)

## 2017-07-12 LAB — PSA: PSA: 0.84 ng/mL (ref 0.10–4.00)

## 2017-07-18 ENCOUNTER — Ambulatory Visit (INDEPENDENT_AMBULATORY_CARE_PROVIDER_SITE_OTHER): Payer: Medicare HMO | Admitting: Internal Medicine

## 2017-07-18 ENCOUNTER — Other Ambulatory Visit: Payer: Self-pay | Admitting: Internal Medicine

## 2017-07-18 ENCOUNTER — Encounter: Payer: Self-pay | Admitting: Internal Medicine

## 2017-07-18 VITALS — BP 134/72 | HR 70 | Temp 98.0°F | Ht 72.0 in | Wt 224.0 lb

## 2017-07-18 DIAGNOSIS — M5440 Lumbago with sciatica, unspecified side: Secondary | ICD-10-CM | POA: Diagnosis not present

## 2017-07-18 DIAGNOSIS — R110 Nausea: Secondary | ICD-10-CM | POA: Insufficient documentation

## 2017-07-18 DIAGNOSIS — R42 Dizziness and giddiness: Secondary | ICD-10-CM

## 2017-07-18 DIAGNOSIS — Z23 Encounter for immunization: Secondary | ICD-10-CM | POA: Diagnosis not present

## 2017-07-18 DIAGNOSIS — J320 Chronic maxillary sinusitis: Secondary | ICD-10-CM | POA: Diagnosis not present

## 2017-07-18 HISTORY — DX: Nausea: R11.0

## 2017-07-18 HISTORY — DX: Dizziness and giddiness: R42

## 2017-07-18 NOTE — Assessment & Plan Note (Addendum)
I suggested a brain MRI to r/o acustic neuroma etc: he will think about it and check on cost ENT ref suggested

## 2017-07-18 NOTE — Progress Notes (Signed)
Subjective:  Patient ID: [name], male    DOB: 02-19-1951  Age: 66 y.o. MRN: [id number]  CC: No chief complaint on file.   HPI [name] presents for GERD, OA knees, dyslipidemia f/u. C/o nausea - off Meloxicam, Pravachol - not better. He has gagging - no vomiting. He is dizzy too off and on... He has to go down on the knees sometimes... On Levaquin  Outpatient Medications Prior to Visit  Medication Sig Dispense Refill  . aspirin 81 MG tablet Take 81 mg by mouth at bedtime.     . cholecalciferol (VITAMIN D) 400 UNITS TABS Take 400 Units by mouth daily.    . cyanocobalamin 500 MCG tablet Take 1,000 mcg by mouth daily.     . fluticasone (FLONASE) 50 MCG/ACT nasal spray USE 1 SPRAY(S) IN EACH NOSTRIL ONCE DAILY 16 g 1  . Glucosamine-Chondroit-MSM-C-Mn (FLEXI JOINT PO) Take by mouth.    . meloxicam (MOBIC) 7.5 MG tablet     . Multiple Vitamin (MULTIVITAMIN) LIQD Take 5 mLs by mouth daily.    . ondansetron (ZOFRAN) 4 MG tablet 1-2 tablets every 4-6 hours as needed for nausea 30 tablet 3  . Pancrelipase, Lip-Prot-Amyl, (ZENPEP) 40000-126000 units CPEP Take by mouth.    . pantoprazole (PROTONIX) 40 MG tablet Take 40 mg by mouth daily.      . traMADol (ULTRAM) 50 MG tablet TAKE ONE TO TWO TABLETS BY MOUTH TWICE DAILY AS NEEDED FOR PAIN 120 tablet 3  . valACYclovir (VALTREX) 500 MG tablet TAKE 1 TABLET BY MOUTH ONCE DAILY 90 tablet 1  . pravastatin (PRAVACHOL) 20 MG tablet Take 1 tablet (20 mg total) by mouth daily. (Patient not taking: Reported on 07/18/2017) 30 tablet 11   Facility-Administered Medications Prior to Visit  Medication Dose Route Frequency Provider Last Rate Last Dose  . methylPREDNISolone acetate (DEPO-MEDROL) injection 40 mg  40 mg Intra-articular Once Plotnikov, Evie Lacks, MD        ROS Review of Systems  Constitutional: Negative for appetite change, fatigue and unexpected weight change.  HENT: Negative for congestion, nosebleeds, sneezing, sore throat and  trouble swallowing.   Eyes: Negative for itching and visual disturbance.  Respiratory: Negative for cough.   Cardiovascular: Negative for chest pain, palpitations and leg swelling.  Gastrointestinal: Positive for nausea. Negative for abdominal distention, blood in stool, diarrhea and vomiting.  Genitourinary: Negative for frequency and hematuria.  Musculoskeletal: Positive for arthralgias and gait problem. Negative for back pain, joint swelling and neck pain.  Skin: Negative for rash.  Neurological: Positive for dizziness. Negative for tremors, speech difficulty and weakness.  Psychiatric/Behavioral: Negative for agitation, dysphoric mood and sleep disturbance. The patient is not nervous/anxious.     Objective:  BP 134/72 (BP Location: Left Arm, Patient Position: Sitting, Cuff Size: Large)   Pulse 70   Temp 98 F (36.7 C) (Oral)   Ht 6' (1.829 m)   Wt 224 lb (101.6 kg)   SpO2 99%   BMI 30.38 kg/m   BP Readings from Last 3 Encounters:  07/18/17 134/72  07/10/17 (!) 144/80  04/14/17 140/72    Wt Readings from Last 3 Encounters:  07/18/17 224 lb (101.6 kg)  07/10/17 223 lb (101.2 kg)  04/14/17 236 lb (107 kg)    Physical Exam  Constitutional: He is oriented to person, place, and time. He appears well-developed. No distress.  NAD  HENT:  Mouth/Throat: Oropharynx is clear and moist.  Eyes: Pupils are equal, round, and reactive  to light. Conjunctivae are normal.  Neck: Normal range of motion. No JVD present. No thyromegaly present.  Cardiovascular: Normal rate, regular rhythm, normal heart sounds and intact distal pulses.  Exam reveals no gallop and no friction rub.   No murmur heard. Pulmonary/Chest: Effort normal and breath sounds normal. No respiratory distress. He has no wheezes. He has no rales. He exhibits no tenderness.  Abdominal: Soft. Bowel sounds are normal. He exhibits no distension and no mass. There is no tenderness. There is no rebound and no guarding.    Musculoskeletal: Normal range of motion. He exhibits tenderness. He exhibits no edema.  Lymphadenopathy:    He has no cervical adenopathy.  Neurological: He is alert and oriented to person, place, and time. He has normal reflexes. No cranial nerve deficit. He exhibits normal muscle tone. He displays a negative Romberg sign. Coordination and gait normal.  Skin: Skin is warm and dry. No rash noted.  Psychiatric: He has a normal mood and affect. His behavior is normal. Judgment and thought content normal.    Lab Results  Component Value Date   WBC 12.5 (H) 07/12/2017   HGB 14.9 07/12/2017   HCT 45.2 07/12/2017   PLT 217.0 07/12/2017   GLUCOSE 110 (H) 07/12/2017   CHOL 225 (H) 07/12/2017   TRIG 159.0 (H) 07/12/2017   HDL 38.80 (L) 07/12/2017   LDLDIRECT 102.0 06/10/2015   LDLCALC 154 (H) 07/12/2017   ALT 29 07/12/2017   AST 18 07/12/2017   NA 140 07/12/2017   K 4.0 07/12/2017   CL 104 07/12/2017   CREATININE 0.85 07/12/2017   BUN 19 07/12/2017   CO2 28 07/12/2017   TSH 1.42 07/12/2017   PSA 0.84 07/12/2017   INR 0.91 07/25/2012   HGBA1C 5.4 06/16/2016    Dg Chest 2 View  Result Date: 09/26/2016 CLINICAL DATA:  Chest pain worsening this morning. Sweating and blurred vision. EXAM: CHEST  2 VIEW COMPARISON:  11/12/2015 CT chest FINDINGS: Stable scarring along the left hemidiaphragm. Stable sclerotic lesion in the left anterior fifth rib, unchanged from 07/17/2012. Cardiac and mediastinal margins appear normal. No pleural effusion. Mild lower thoracic spondylosis. IMPRESSION: 1.  No active cardiopulmonary disease is radiographically apparent. 2. Chronic scarring along the left hemidiaphragm. 3. Chronic benign sclerotic lesion anteriorly in the right fifth rib accounting for the density in this vicinity. Electronically Signed   By: Van Clines M.D.   On: 09/26/2016 08:44    Assessment & Plan:   There are no diagnoses linked to this encounter. I am having Mr. [name] maintain  his aspirin, pantoprazole, cholecalciferol, cyanocobalamin, pravastatin, traMADol, fluticasone, valACYclovir, meloxicam, Glucosamine-Chondroit-MSM-C-Mn (FLEXI JOINT PO), Pancrelipase (Lip-Prot-Amyl), multivitamin, and ondansetron. We will continue to administer methylPREDNISolone acetate.  No orders of the defined types were placed in this encounter.    Follow-up: No Follow-up on file.  Walker Kehr, MD

## 2017-07-18 NOTE — Assessment & Plan Note (Signed)
I suggested a brain MRI to r/o acustic neuroma etc: he will think about it and check on cost

## 2017-07-18 NOTE — Assessment & Plan Note (Signed)
Tramadol prn 

## 2017-07-18 NOTE — Assessment & Plan Note (Addendum)
ENT ref suggested Po abx - finishing Levaquin

## 2017-07-18 NOTE — Assessment & Plan Note (Signed)
Tramadol  Potential benefits of a long term opioids use as well as potential risks (i.e. addiction risk, apnea etc) and complications (i.e. Somnolence, constipation and others) were explained to the patient and were aknowledged.   

## 2017-07-19 NOTE — Addendum Note (Signed)
Addended by: Karren Cobble on: 07/19/2017 08:10 AM   Modules accepted: Orders

## 2017-07-20 ENCOUNTER — Telehealth: Payer: Self-pay | Admitting: Internal Medicine

## 2017-07-20 NOTE — Telephone Encounter (Signed)
Called pharmacy and initiated one of the Zofran refills they have on file.

## 2017-07-20 NOTE — Telephone Encounter (Signed)
He is going to see if dr Henrene Pastor can refill this.   It was just give to him on the 27 and he is completely out.

## 2017-07-20 NOTE — Telephone Encounter (Signed)
Pt called in and said that pharmacy told him that they never got this med.  Can we resend it.  It is the correct pharmacy, I verified    ondansetron (ZOFRAN) 4 MG tablet [[id number]]

## 2017-07-20 NOTE — Telephone Encounter (Signed)
Pls call pt back med was rx by Dr. Henrene Pastor not Dr. Alain Marion...sorry but pt will have to call GI to have rx resent.

## 2017-08-01 DIAGNOSIS — J209 Acute bronchitis, unspecified: Secondary | ICD-10-CM | POA: Diagnosis not present

## 2017-08-01 DIAGNOSIS — J0100 Acute maxillary sinusitis, unspecified: Secondary | ICD-10-CM | POA: Diagnosis not present

## 2017-08-14 DIAGNOSIS — R05 Cough: Secondary | ICD-10-CM | POA: Diagnosis not present

## 2017-08-21 ENCOUNTER — Encounter (HOSPITAL_BASED_OUTPATIENT_CLINIC_OR_DEPARTMENT_OTHER): Payer: Self-pay | Admitting: *Deleted

## 2017-08-21 ENCOUNTER — Emergency Department (HOSPITAL_BASED_OUTPATIENT_CLINIC_OR_DEPARTMENT_OTHER)
Admission: EM | Admit: 2017-08-21 | Discharge: 2017-08-21 | Disposition: A | Discharge status: Home / Self Care | Payer: Medicare HMO | Attending: Emergency Medicine | Admitting: Emergency Medicine

## 2017-08-21 ENCOUNTER — Emergency Department (HOSPITAL_BASED_OUTPATIENT_CLINIC_OR_DEPARTMENT_OTHER): Payer: Medicare HMO

## 2017-08-21 DIAGNOSIS — Z7982 Long term (current) use of aspirin: Secondary | ICD-10-CM | POA: Diagnosis not present

## 2017-08-21 DIAGNOSIS — Z96651 Presence of right artificial knee joint: Secondary | ICD-10-CM | POA: Diagnosis not present

## 2017-08-21 DIAGNOSIS — Z79899 Other long term (current) drug therapy: Secondary | ICD-10-CM | POA: Diagnosis not present

## 2017-08-21 DIAGNOSIS — Z7902 Long term (current) use of antithrombotics/antiplatelets: Secondary | ICD-10-CM | POA: Insufficient documentation

## 2017-08-21 DIAGNOSIS — R079 Chest pain, unspecified: Secondary | ICD-10-CM | POA: Diagnosis not present

## 2017-08-21 DIAGNOSIS — F17290 Nicotine dependence, other tobacco product, uncomplicated: Secondary | ICD-10-CM | POA: Diagnosis not present

## 2017-08-21 DIAGNOSIS — R0789 Other chest pain: Secondary | ICD-10-CM | POA: Insufficient documentation

## 2017-08-21 LAB — CBC WITH DIFFERENTIAL/PLATELET
Basophils Absolute: 0 10*3/uL (ref 0.0–0.1)
Basophils Relative: 0 %
EOS ABS: 0.1 10*3/uL (ref 0.0–0.7)
Eosinophils Relative: 1 %
HEMATOCRIT: 41.4 % (ref 39.0–52.0)
HEMOGLOBIN: 14 g/dL (ref 13.0–17.0)
LYMPHS ABS: 2.5 10*3/uL (ref 0.7–4.0)
LYMPHS PCT: 34 %
MCH: 31.9 pg (ref 26.0–34.0)
MCHC: 33.8 g/dL (ref 30.0–36.0)
MCV: 94.3 fL (ref 78.0–100.0)
MONOS PCT: 12 %
Monocytes Absolute: 0.8 10*3/uL (ref 0.1–1.0)
NEUTROS PCT: 53 %
Neutro Abs: 3.8 10*3/uL (ref 1.7–7.7)
Platelets: 214 10*3/uL (ref 150–400)
RBC: 4.39 MIL/uL (ref 4.22–5.81)
RDW: 14.4 % (ref 11.5–15.5)
WBC: 7.3 10*3/uL (ref 4.0–10.5)

## 2017-08-21 LAB — BASIC METABOLIC PANEL
Anion gap: 5 (ref 5–15)
BUN: 15 mg/dL (ref 6–20)
CHLORIDE: 108 mmol/L (ref 101–111)
CO2: 27 mmol/L (ref 22–32)
CREATININE: 0.97 mg/dL (ref 0.61–1.24)
Calcium: 9.2 mg/dL (ref 8.9–10.3)
GFR calc non Af Amer: >60 mL/min (ref >60)
Glucose, Bld: 114 mg/dL — ABNORMAL HIGH (ref 65–99)
POTASSIUM: 4 mmol/L (ref 3.5–5.1)
Sodium: 140 mmol/L (ref 135–145)

## 2017-08-21 LAB — TROPONIN I: Troponin I: <0.03 ng/mL (ref <0.03)

## 2017-08-21 LAB — D-DIMER, QUANTITATIVE: D-Dimer, Quant: 0.49 ug/mL-FEU (ref 0.00–0.50)

## 2017-08-21 NOTE — Discharge Instructions (Signed)
Please read attached information regarding your condition. Follow-up with your PCP for further evaluation. Return to ED for worsening chest pain, trouble breathing, trouble swallowing, increased coughing up blood, leg swelling.

## 2017-08-21 NOTE — ED Provider Notes (Signed)
[facility] DEPT [facility] Provider Note   CSN: [id number] Arrival date & time: 08/21/17  7062     History   Chief Complaint Chief Complaint  Patient presents with  . Chest Pain    HPI [name] is a 66 y.o. male with past medical history of dyslipidemia, GERD, presents to ED for evaluation of intermittent chest pain for the past several months that has worsened for the past 3 days. He states that he was concerned because the chest pain is radiating to his jaw and he felt "like my heart was fluttering." He also reports intermittent hemoptysis for the past 3 days. According to patient, his GI doctor chronically overdosed him on Reglan. After several weeks of taking Reglan 6 times a day he presented to the ED several years ago for what he thought was a heart attack. He then weaned himself off the bed and has been off of it for the past several months. She denies any previous MI, DVT, PE, recent travel, leg swelling, falls, injuries, recent prolonged travel, trouble breathing, history of cancer.  HPI    Past Medical History:  Diagnosis Date  . Actinic keratosis   . Colonic polyp   . COLONIC POLYPS, HX OF   . CONTACT DERMATITIS DUE TO SOLVENTS   . DOE (dyspnea on exertion) 07-25-12   related to eating shrimp  . GERD (gastroesophageal reflux disease)   . HAND PAIN   . HERPES, GENITAL NOS   . Ingrowing toenail of left foot 07-25-12   is resolved now 07-25-12  . KNEE PAIN   . Neoplasm of uncertain behavior of skin   . OA (osteoarthritis) of knee   . ONYCHOMYCOSIS   . OSTEOARTHRITIS   . Preop exam for internal medicine   . SINUSITIS, ACUTE   . Well adult exam     Patient Active Problem List   Diagnosis Date Noted  . Nausea 07/18/2017  . Dizzinesses 07/18/2017  . Arthralgia 04/14/2017  . 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 Heart murmur 06/09/2015  . Low back pain 06/09/2015  . Fatigue 06/09/2015  . Hyperglycemia 06/09/2015  . Scrotal mass 06/09/2015  . Bladder neck obstruction  02/03/2015  . OSA (obstructive sleep apnea) 08/21/2013  . Sinusitis, chronic 08/12/2013  . Dyslipidemia 06/21/2013  . 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 Barrett esophagus 07/20/2012  . CAD (coronary artery disease) 07/19/2012  . Ingrowing toenail of left foot 07/06/2012  . DOE (dyspnea on exertion) 06/18/2012  . Preop exam for internal medicine 06/18/2012  . Well adult exam 08/04/2011  . HAND PAIN 12/24/2010  . NEOPLASM OF UNCERTAIN BEHAVIOR OF SKIN 06/21/2010  . Acute sinusitis 06/21/2010  . ACTINIC KERATOSIS 06/21/2010  . CONTACT DERMATITIS DUE TO SOLVENTS 01/28/2010  . KNEE PAIN 12/21/2009  . Osteoarthritis 11/21/2008  . HERPES, GENITAL NOS 05/09/2008  . ONYCHOMYCOSIS 05/09/2008  . GERD 05/09/2008  . COLONIC POLYPS, HX OF 05/09/2008    Past Surgical History:  Procedure Laterality Date  . JOINT REPLACEMENT  9/13   L TKR  . KNEE ARTHROSCOPY     Left  . TOTAL KNEE ARTHROPLASTY  07/31/2012   Procedure: TOTAL KNEE ARTHROPLASTY;  Surgeon: Sydnee Cabal, MD;  Location: [facility];  Service: Orthopedics;  Laterality: Left;  . UMBILICAL HERNIA REPAIR  07-25-12   10 yrs ago       Home Medications    Prior to Admission medications   Medication Sig Start Date End Date Taking? Authorizing Provider  aspirin 81 MG  tablet Take 81 mg by mouth at bedtime.     [provider]  cholecalciferol (VITAMIN D) 400 UNITS TABS Take 400 Units by mouth daily.    [provider]  cyanocobalamin 500 MCG tablet Take 1,000 mcg by mouth daily.     [provider]  fluticasone (FLONASE) 50 MCG/ACT nasal spray USE 1 SPRAY(S) IN EACH NOSTRIL ONCE DAILY 06/13/17   Plotnikov, Evie Lacks, MD  Glucosamine-Chondroit-MSM-C-Mn (FLEXI JOINT PO) Take by mouth.    [provider]  meloxicam (MOBIC) 7.5 MG tablet  06/15/17   [provider]  meloxicam (MOBIC) 7.5 MG tablet TAKE 1 TO 2 TABLETS BY MOUTH ONCE DAILY 07/18/17   Plotnikov, Evie Lacks, MD  Multiple Vitamin  (MULTIVITAMIN) LIQD Take 5 mLs by mouth daily.    [provider]  ondansetron (ZOFRAN) 4 MG tablet 1-2 tablets every 4-6 hours as needed for nausea 07/10/17   Irene Shipper, MD  pantoprazole (PROTONIX) 40 MG tablet Take 40 mg by mouth daily.      [provider]  pravastatin (PRAVACHOL) 20 MG tablet Take 1 tablet (20 mg total) by mouth daily. Patient not taking: Reported on 07/18/2017 12/16/16 12/16/17  Plotnikov, Evie Lacks, MD  traMADol (ULTRAM) 50 MG tablet TAKE ONE TO TWO TABLETS BY MOUTH TWICE DAILY AS NEEDED FOR PAIN 03/01/17   Plotnikov, Evie Lacks, MD  valACYclovir (VALTREX) 500 MG tablet TAKE 1 TABLET BY MOUTH ONCE DAILY 06/16/17   Plotnikov, Evie Lacks, MD    Family History Family History  Problem Relation Age of Onset  . Diabetes Mother   . Heart disease Father 52       CAD, MI, CHF  . Lung cancer Brother        smoker  . Coronary artery disease Other   . Colon polyps Brother   . Colon cancer Neg Hx   . Stomach cancer Neg Hx     Social History Social History  Substance Use Topics  . Smoking status: Current Some Day Smoker    Types: Cigars  . Smokeless tobacco: Never Used     Comment: occ. cigar  . Alcohol use Yes     Comment: occ. socially     Allergies   Shrimp [shellfish allergy]; Hytrin [terazosin]; Lipitor [atorvastatin]; Penicillins; and Reglan [metoclopramide]   Review of Systems Review of Systems  Constitutional: Negative for appetite change, chills and fever.  HENT: Negative for ear pain, rhinorrhea, sneezing and sore throat.   Eyes: Negative for photophobia and visual disturbance.  Respiratory: Positive for cough. Negative for chest tightness, shortness of breath and wheezing.   Cardiovascular: Positive for chest pain and palpitations.  Gastrointestinal: Negative for abdominal pain, blood in stool, constipation, diarrhea, nausea and vomiting.  Genitourinary: Negative for dysuria, hematuria and urgency.  Musculoskeletal: Positive for  arthralgias. Negative for myalgias.  Skin: Negative for rash.  Neurological: Negative for dizziness, weakness and light-headedness.     Physical Exam Updated Vital Signs BP (!) 151/96 (BP Location: Right Arm)   Pulse 68   Temp 98.5 F (36.9 C) (Oral)   Resp 20   Ht 6' (1.829 m)   Wt 101.2 kg (223 lb)   SpO2 98%   BMI 30.24 kg/m   Physical Exam  Constitutional: He appears well-developed and well-nourished. No distress.  Nontoxic appearing and in no acute distress. Patient does appear extremely anxious.  HENT:  Head: Normocephalic and atraumatic.  Nose: Nose normal.  Eyes: Conjunctivae and EOM are normal. Left  eye exhibits no discharge. No scleral icterus.  Neck: Normal range of motion. Neck supple.  Cardiovascular: Normal rate, regular rhythm, normal heart sounds and intact distal pulses.  Exam reveals no gallop and no friction rub.   No murmur heard. Pulmonary/Chest: Effort normal and breath sounds normal. No respiratory distress.  Abdominal: Soft. Bowel sounds are normal. He exhibits no distension. There is no tenderness. There is no guarding.  Musculoskeletal: Normal range of motion. He exhibits no edema.  Neurological: He is alert. He exhibits normal muscle tone. Coordination normal.  Skin: Skin is warm and dry. No rash noted.  Psychiatric: He has a normal mood and affect.  Nursing note and vitals reviewed.    ED Treatments / Results  Labs (all labs ordered are listed, but only abnormal results are displayed) Labs Reviewed  BASIC METABOLIC PANEL - Abnormal; Notable for the following:       Result Value   Glucose, Bld 114 (*)    All other components within normal limits  CBC WITH DIFFERENTIAL/PLATELET  TROPONIN I  D-DIMER, QUANTITATIVE (NOT AT [facility])  TROPONIN I    EKG  EKG Interpretation  Date/Time:  Monday August 21 2017 09:45:50 EDT Ventricular Rate:  70 PR Interval:    QRS Duration: 94 QT Interval:  398 QTC Calculation: 430 R Axis:   -13 Text  Interpretation:  Sinus rhythm since last tracing no significant change Confirmed by Malvin Johns [phone]) on 08/21/2017 9:58:20 AM       Radiology Dg Chest 2 View  Result Date: 08/21/2017 CLINICAL DATA:  Chest pain. EXAM: CHEST  2 VIEW COMPARISON:  Radiographs of September 26, 2016 and November 12, 2015. FINDINGS: The heart size and mediastinal contours are within normal limits. No pneumothorax or pleural effusion is noted. Minimal left basilar scarring is noted. No acute pulmonary disease is noted. The visualized skeletal structures are unremarkable. IMPRESSION: No active cardiopulmonary disease. Electronically Signed   By: Marijo Conception, M.D.   On: 08/21/2017 10:16    Procedures Procedures (including critical care time)  Medications Ordered in ED Medications - No data to display   Initial Impression / Assessment and Plan / ED Course  I have reviewed the triage vital signs and the nursing notes.  Pertinent labs & imaging results that were available during my care of the patient were reviewed by me and considered in my medical decision making (see chart for details).     Patient, with past medical history of dyslipidemia, GERD, presents to ED for evaluation of intermittent chest pain for the past several months that has worsened in the past 3 days. He is concerned today because of chest pain is radiating to her jaw and felt his heart fluttering. He has been not feeling well chronically since his GI doctor prescribed him an increased dose and is Reglan. He has been taking Reglan for the past 12 years and has been weaning himself off of it over the past year. On physical exam patient does appear very anxious and worried. He is otherwise nontoxic appearing and in no acute distress. He is afebrile with no history of fever. He states that the fluttering in his heart has resolved. He denies any previous MI, DVT, PE, recent travel or leg swelling. His lungs are clear to auscultation bilaterally  with normal heart rate. Oxygen saturations 98 100 on room air. EKG shows similar tracings to prior. Chest x-ray returned as negative for acute process. Initial troponin negative. Lab work including BMP, CBC, d-dimer  unremarkable. Also troponin was done due to patient's age and risk factors and this returned as negative as well. Patient is hypertensive here in the ED states that he does not have a history of these symptoms. He states that he will follow-up with his PCP for any blood pressure medication management. He continues to state that he is unsure why he is having these symptoms. He states that "it could be because I took Reglan for so long and now won't take it anymore." I encouraged him to follow up with his PCP and reassured him of his negative workup today. I was for cardiac or pulmonary cause of his chest pain based on today's symptoms. He will probably benefit from a cardiology referral from his PCP. Patient appears stable for discharge at this time. Strict return precautions given.  Final Clinical Impressions(s) / ED Diagnoses   Final diagnoses:  Chest wall pain    New Prescriptions Discharge Medication List as of 08/21/2017  2:12 PM       Delia Heady, PA-C 08/21/17 1552    Malvin Johns, MD 08/21/17 1557

## 2017-08-21 NOTE — ED Notes (Signed)
Patient states that he is upset because we did not find out what was wrong and he wanted answers. The patient was educated about the scope of what an emergency room can do. The patient still very upset

## 2017-08-21 NOTE — ED Triage Notes (Signed)
Pt reports chest pains and heart fluttering "for many years, my gi doctor overdosed me on reglan, and I haven't been right since then." when asked what is new and different today, pt states he has pain to his left jaw, aching, and feeling as if his heart is beating too fast. Pt denies sob, reports some doe in the last few weeks.

## 2017-08-24 ENCOUNTER — Encounter: Payer: Self-pay | Admitting: Internal Medicine

## 2017-08-24 ENCOUNTER — Ambulatory Visit (INDEPENDENT_AMBULATORY_CARE_PROVIDER_SITE_OTHER): Payer: Medicare HMO | Admitting: Internal Medicine

## 2017-08-24 DIAGNOSIS — K2100 Gastro-esophageal reflux disease with esophagitis, without bleeding: Secondary | ICD-10-CM

## 2017-08-24 DIAGNOSIS — K2270 Barrett's esophagus without dysplasia: Secondary | ICD-10-CM

## 2017-08-24 DIAGNOSIS — R110 Nausea: Secondary | ICD-10-CM

## 2017-08-24 DIAGNOSIS — J320 Chronic maxillary sinusitis: Secondary | ICD-10-CM | POA: Diagnosis not present

## 2017-08-24 DIAGNOSIS — M5440 Lumbago with sciatica, unspecified side: Secondary | ICD-10-CM

## 2017-08-24 DIAGNOSIS — R5382 Chronic fatigue, unspecified: Secondary | ICD-10-CM

## 2017-08-24 DIAGNOSIS — M159 Polyosteoarthritis, unspecified: Secondary | ICD-10-CM

## 2017-08-24 DIAGNOSIS — F419 Anxiety disorder, unspecified: Secondary | ICD-10-CM | POA: Diagnosis not present

## 2017-08-24 DIAGNOSIS — M150 Primary generalized (osteo)arthritis: Secondary | ICD-10-CM | POA: Diagnosis not present

## 2017-08-24 DIAGNOSIS — I2510 Atherosclerotic heart disease of native coronary artery without angina pectoris: Secondary | ICD-10-CM | POA: Diagnosis not present

## 2017-08-24 HISTORY — DX: Anxiety disorder, unspecified: F41.9

## 2017-08-24 MED ORDER — AZITHROMYCIN 250 MG PO TABS: ORAL_TABLET | ORAL | 0 refills | Status: DC

## 2017-08-24 NOTE — Assessment & Plan Note (Signed)
Pt declined Rx 

## 2017-08-24 NOTE — Progress Notes (Signed)
Subjective:  Patient ID: [name], male    DOB: 02-14-1951  Age: 66 y.o. MRN: [id number]  CC: No chief complaint on file.   HPI VERNARD GRAM presents for CP, productive cough, GERD f/u. He went to ER for CP on 10/8 - MI/PE r/o. C/o anxiety. Body pains are much better (60%) off Reglan. Off Tramadol ER notes reviewed  Outpatient Medications Prior to Visit  Medication Sig Dispense Refill  . aspirin 81 MG tablet Take 81 mg by mouth at bedtime.     . cholecalciferol (VITAMIN D) 400 UNITS TABS Take 400 Units by mouth daily.    . cyanocobalamin 500 MCG tablet Take 1,000 mcg by mouth daily.     . fluticasone (FLONASE) 50 MCG/ACT nasal spray USE 1 SPRAY(S) IN EACH NOSTRIL ONCE DAILY 16 g 1  . meloxicam (MOBIC) 7.5 MG tablet     . meloxicam (MOBIC) 7.5 MG tablet TAKE 1 TO 2 TABLETS BY MOUTH ONCE DAILY 180 tablet 0  . Multiple Vitamin (MULTIVITAMIN) LIQD Take 5 mLs by mouth daily.    . ondansetron (ZOFRAN) 4 MG tablet 1-2 tablets every 4-6 hours as needed for nausea 30 tablet 3  . pantoprazole (PROTONIX) 40 MG tablet Take 40 mg by mouth daily.      . traMADol (ULTRAM) 50 MG tablet TAKE ONE TO TWO TABLETS BY MOUTH TWICE DAILY AS NEEDED FOR PAIN 120 tablet 3  . valACYclovir (VALTREX) 500 MG tablet TAKE 1 TABLET BY MOUTH ONCE DAILY 90 tablet 1  . Glucosamine-Chondroit-MSM-C-Mn (FLEXI JOINT PO) Take by mouth.    . pravastatin (PRAVACHOL) 20 MG tablet Take 1 tablet (20 mg total) by mouth daily. (Patient not taking: Reported on 07/18/2017) 30 tablet 11   Facility-Administered Medications Prior to Visit  Medication Dose Route Frequency Provider Last Rate Last Dose  . methylPREDNISolone acetate (DEPO-MEDROL) injection 40 mg  40 mg Intra-articular Once Plotnikov, Evie Lacks, MD        ROS Review of Systems  Constitutional: Positive for fatigue. Negative for appetite change and unexpected weight change.  HENT: Negative for congestion, nosebleeds, sneezing, sore throat and trouble swallowing.     Eyes: Negative for itching and visual disturbance.  Respiratory: Positive for cough, chest tightness and shortness of breath.   Cardiovascular: Positive for chest pain and palpitations. Negative for leg swelling.  Gastrointestinal: Positive for nausea. Negative for abdominal distention, blood in stool and diarrhea.  Genitourinary: Negative for frequency and hematuria.  Musculoskeletal: Positive for arthralgias and back pain. Negative for gait problem, joint swelling and neck pain.  Skin: Negative for rash.  Neurological: Negative for dizziness, tremors, speech difficulty and weakness.  Psychiatric/Behavioral: Negative for agitation, dysphoric mood, sleep disturbance and suicidal ideas. The patient is nervous/anxious.     Objective:  BP 128/76 (BP Location: Left Arm, Patient Position: Sitting, Cuff Size: Large)   Pulse 77   Temp 98.3 F (36.8 C) (Oral)   Ht 6' (1.829 m)   Wt 224 lb (101.6 kg)   SpO2 99%   BMI 30.38 kg/m   BP Readings from Last 3 Encounters:  08/24/17 128/76  08/21/17 (!) 151/96  07/18/17 134/72    Wt Readings from Last 3 Encounters:  08/24/17 224 lb (101.6 kg)  08/21/17 223 lb (101.2 kg)  07/18/17 224 lb (101.6 kg)    Physical Exam  Constitutional: He is oriented to person, place, and time. He appears well-developed. No distress.  NAD  HENT:  Mouth/Throat: Oropharynx is clear and moist.  Eyes: Pupils are equal, round, and reactive to light. Conjunctivae are normal.  Neck: Normal range of motion. No JVD present. No thyromegaly present.  Cardiovascular: Normal rate, regular rhythm, normal heart sounds and intact distal pulses.  Exam reveals no gallop and no friction rub.   No murmur heard. Pulmonary/Chest: Effort normal and breath sounds normal. No respiratory distress. He has no wheezes. He has no rales. He exhibits no tenderness.  Abdominal: Soft. Bowel sounds are normal. He exhibits no distension and no mass. There is no tenderness. There is no rebound  and no guarding.  Musculoskeletal: Normal range of motion. He exhibits no edema or tenderness.  Lymphadenopathy:    He has no cervical adenopathy.  Neurological: He is alert and oriented to person, place, and time. He has normal reflexes. No cranial nerve deficit. He exhibits normal muscle tone. He displays a negative Romberg sign. Coordination and gait normal.  Skin: Skin is warm and dry. No rash noted.  Psychiatric: He has a normal mood and affect. His behavior is normal. Judgment and thought content normal.    Lab Results  Component Value Date   WBC 7.3 08/21/2017   HGB 14.0 08/21/2017   HCT 41.4 08/21/2017   PLT 214 08/21/2017   GLUCOSE 114 (H) 08/21/2017   CHOL 225 (H) 07/12/2017   TRIG 159.0 (H) 07/12/2017   HDL 38.80 (L) 07/12/2017   LDLDIRECT 102.0 06/10/2015   LDLCALC 154 (H) 07/12/2017   ALT 29 07/12/2017   AST 18 07/12/2017   NA 140 08/21/2017   K 4.0 08/21/2017   CL 108 08/21/2017   CREATININE 0.97 08/21/2017   BUN 15 08/21/2017   CO2 27 08/21/2017   TSH 1.42 07/12/2017   PSA 0.84 07/12/2017   INR 0.91 07/25/2012   HGBA1C 5.4 06/16/2016    Dg Chest 2 View  Result Date: 08/21/2017 CLINICAL DATA:  Chest pain. EXAM: CHEST  2 VIEW COMPARISON:  Radiographs of September 26, 2016 and November 12, 2015. FINDINGS: The heart size and mediastinal contours are within normal limits. No pneumothorax or pleural effusion is noted. Minimal left basilar scarring is noted. No acute pulmonary disease is noted. The visualized skeletal structures are unremarkable. IMPRESSION: No active cardiopulmonary disease. Electronically Signed   By: Marijo Conception, M.D.   On: 08/21/2017 10:16    Assessment & Plan:   There are no diagnoses linked to this encounter. I have discontinued Mr. Hemmerich pravastatin and Glucosamine-Chondroit-MSM-C-Mn (FLEXI JOINT PO). I am also having him maintain his aspirin, pantoprazole, cholecalciferol, cyanocobalamin, traMADol, fluticasone, valACYclovir, meloxicam,  multivitamin, ondansetron, and meloxicam. We will continue to administer methylPREDNISolone acetate.  No orders of the defined types were placed in this encounter.    Follow-up: No Follow-up on file.  Walker Kehr, MD

## 2017-08-24 NOTE — Assessment & Plan Note (Signed)
Zpac 

## 2017-08-24 NOTE — Assessment & Plan Note (Signed)
Protonix.  ?

## 2017-08-24 NOTE — Assessment & Plan Note (Signed)
much better (60%) off Reglan

## 2017-08-24 NOTE — Assessment & Plan Note (Signed)
Body pains are much better (60%) off Reglan. Off Tramadol

## 2017-08-24 NOTE — Assessment & Plan Note (Signed)
F/u w/Dr Johnsie Cancel Pravastatin, ASA

## 2017-08-24 NOTE — Assessment & Plan Note (Signed)
Better  

## 2017-08-29 ENCOUNTER — Encounter: Payer: Self-pay | Admitting: Cardiology

## 2017-08-29 ENCOUNTER — Telehealth: Payer: Self-pay

## 2017-08-29 ENCOUNTER — Ambulatory Visit (INDEPENDENT_AMBULATORY_CARE_PROVIDER_SITE_OTHER): Payer: Medicare HMO | Admitting: Cardiology

## 2017-08-29 VITALS — BP 130/70 | HR 68 | Ht 72.0 in | Wt 228.1 lb

## 2017-08-29 DIAGNOSIS — I209 Angina pectoris, unspecified: Secondary | ICD-10-CM

## 2017-08-29 DIAGNOSIS — I200 Unstable angina: Secondary | ICD-10-CM | POA: Insufficient documentation

## 2017-08-29 DIAGNOSIS — G4733 Obstructive sleep apnea (adult) (pediatric): Secondary | ICD-10-CM

## 2017-08-29 DIAGNOSIS — E785 Hyperlipidemia, unspecified: Secondary | ICD-10-CM | POA: Diagnosis not present

## 2017-08-29 MED ORDER — NITROGLYCERIN 0.4 MG SL SUBL: 0.4000 mg | SUBLINGUAL_TABLET | SUBLINGUAL | 6 refills | Status: DC | PRN

## 2017-08-29 NOTE — Progress Notes (Signed)
Cardiology Office Note:    Date:  08/29/2017   ID:  [name], DOB 1951/07/01, MRN [id number]  PCP:  Tresa Garter, MD  Cardiologist:  Garwin Brothers, MD   Referring MD: Tresa Garter, MD    ASSESSMENT:    1. OSA (obstructive sleep apnea)   2. Dyslipidemia   3. Angina pectoris (HCC)    PLAN:    In order of problems listed above:  1. I discussed my findings with the patient. His symptoms are very concerning.In view of the patient's symptoms, I discussed with the patient options for evaluation. Invasive and noninvasive options were given to the patient. I discussed stress testing and coronary angiography and left heart catheterization at length. Benefits, pros and cons of each approach were discussed at length. Patient had multiple questions which were answered to the patient's satisfaction. Patient opted for invasive evaluation and we will set up for coronary angiography and left heart catheterization. Further recommendations will be made based on the findings with coronary angiography. In the interim if the patient has any significant symptoms in hospital to the nearest emergency room. 2. Patient's blood pressure stable and lipids were reviewed and discussed with him and these are followed by his primary care physician. Sublingual nitroglycerin prescription was sent is protocol and 911 protocol explained.   Medication Adjustments/Labs and Tests Ordered: Current medicines are reviewed at length with the patient today.  Concerns regarding medicines are outlined above.  Orders Placed This Encounter  Procedures  . INR/PT   Meds ordered this encounter  Medications  . nitroGLYCERIN (NITROSTAT) 0.4 MG SL tablet    Sig: Place 1 tablet (0.4 mg total) under the tongue every 5 (five) minutes as needed for chest pain.    Dispense:  11 tablet    Refill:  6     History of Present Illness:    [name] is a 66 y.o. male who is being seen today for the evaluation  of chest pain suggesting angina at the request of Plotnikov, Georgina Quint, MD. Patient is a pleasant 66 year old male. He has past medical history of dyslipidemia.he mentions to me that he's been having substernal chest tightness at times occasionally it radiates to the neck and to the arms. He was evaluated at the hospital for similar symptoms and discharged. He is here for evaluation of the symptoms as they're affecting his quality of life and his confidence. At the time of my evaluation is alert awake oriented and in no distress.  Past Medical History:  Diagnosis Date  . Actinic keratosis   . Colonic polyp   . COLONIC POLYPS, HX OF   . CONTACT DERMATITIS DUE TO SOLVENTS   . DOE (dyspnea on exertion) 07-25-12   related to eating shrimp  . GERD (gastroesophageal reflux disease)   . HAND PAIN   . HERPES, GENITAL NOS   . Ingrowing toenail of left foot 07-25-12   is resolved now 07-25-12  . KNEE PAIN   . Neoplasm of uncertain behavior of skin   . OA (osteoarthritis) of knee   . ONYCHOMYCOSIS   . OSTEOARTHRITIS   . Preop exam for internal medicine   . SINUSITIS, ACUTE   . Well adult exam     Past Surgical History:  Procedure Laterality Date  . JOINT REPLACEMENT  9/13   L TKR  . KNEE ARTHROSCOPY     Left  . TOTAL KNEE ARTHROPLASTY  07/31/2012   Procedure: TOTAL KNEE ARTHROPLASTY;  Surgeon:  Eugenia Mcalpine, MD;  Location: [facility];  Service: Orthopedics;  Laterality: Left;  . UMBILICAL HERNIA REPAIR  07-25-12   10 yrs ago    Current Medications: Current Meds  Medication Sig  . aspirin 81 MG tablet Take 81 mg by mouth at bedtime.   [name] azithromycin (ZITHROMAX Z-PAK) 250 MG tablet As directed  . cholecalciferol (VITAMIN D) 400 UNITS TABS Take 400 Units by mouth daily.  . cyanocobalamin 500 MCG tablet Take 1,000 mcg by mouth daily.   . fluticasone (FLONASE) 50 MCG/ACT nasal spray USE 1 SPRAY(S) IN EACH NOSTRIL ONCE DAILY  . meloxicam (MOBIC) 7.5 MG tablet TAKE 1 TO 2 TABLETS BY MOUTH ONCE  DAILY  . Multiple Vitamin (MULTIVITAMIN) LIQD Take 5 mLs by mouth daily.  . Nutritional Supplements (JUICE PLUS FIBRE PO) Take 1 tablet by mouth 2 (two) times daily.  . ondansetron (ZOFRAN) 4 MG tablet 1-2 tablets every 4-6 hours as needed for nausea  . pantoprazole (PROTONIX) 40 MG tablet Take 40 mg by mouth daily.    . traMADol (ULTRAM) 50 MG tablet TAKE ONE TO TWO TABLETS BY MOUTH TWICE DAILY AS NEEDED FOR PAIN  . valACYclovir (VALTREX) 500 MG tablet TAKE 1 TABLET BY MOUTH ONCE DAILY   Current Facility-Administered Medications for the 08/29/17 encounter (Office Visit) with Tawny Raspberry, Aundra Dubin, MD  Medication  . methylPREDNISolone acetate (DEPO-MEDROL) injection 40 mg     Allergies:   Hytrin [terazosin]; Lipitor [atorvastatin]; Penicillins; and Reglan [metoclopramide]   Social History   Social History  . Marital status: Married    Spouse name: N/A  . Number of children: N/A  . Years of education: N/A   Social History Main Topics  . Smoking status: Former Smoker    Types: Cigars    Quit date: 08/30/2007  . Smokeless tobacco: Never Used     Comment: occ. cigar  . Alcohol use Yes     Comment: occ. socially  . Drug use: Yes    Types: Marijuana     Comment: Marijuana socially   . Sexual activity: Yes    Partners: Female    Birth control/ protection: None   Other Topics Concern  . None   Social History Narrative  . None     Family History: The patient's family history includes Colon polyps in his brother; Coronary artery disease in his other; Diabetes in his mother; Heart disease (age of onset: 69) in his father; Lung cancer in his brother. There is no history of Colon cancer or Stomach cancer.  ROS:   Please see the history of present illness.    All other systems reviewed and are negative.  EKGs/Labs/Other Studies Reviewed:    The following studies were reviewed today: I reviewed his records and discussed findings with the patient at extensive length. EKG was  reviewed and reveals sinus rhythm with nonspecific ST-T changes   Recent Labs: 07/12/2017: ALT 29; TSH 1.42 08/21/2017: BUN 15; Creatinine, Ser 0.97; Hemoglobin 14.0; Platelets 214; Potassium 4.0; Sodium 140  Recent Lipid Panel    Component Value Date/Time   CHOL 225 (H) 07/12/2017 0732   TRIG 159.0 (H) 07/12/2017 0732   HDL 38.80 (L) 07/12/2017 0732   CHOLHDL 6 07/12/2017 0732   VLDL 31.8 07/12/2017 0732   LDLCALC 154 (H) 07/12/2017 0732   LDLDIRECT 102.0 06/10/2015 0730    Physical Exam:    VS:  BP 130/70   Pulse 68   Ht 6' (1.829 m)   Wt 228 lb 1.9  oz (103.5 kg)   SpO2 97%   BMI 30.94 kg/m     Wt Readings from Last 3 Encounters:  08/29/17 228 lb 1.9 oz (103.5 kg)  08/24/17 224 lb (101.6 kg)  08/21/17 223 lb (101.2 kg)     GEN: Patient is in no acute distress HEENT: Normal NECK: No JVD; No carotid bruits LYMPHATICS: No lymphadenopathy CARDIAC: S1 S2 regular, 2/6 systolic murmur at the apex. RESPIRATORY:  Clear to auscultation without rales, wheezing or rhonchi  ABDOMEN: Soft, non-tender, non-distended MUSCULOSKELETAL:  No edema; No deformity  SKIN: Warm and dry NEUROLOGIC:  Alert and oriented x 3 PSYCHIATRIC:  Normal affect    Signed, Garwin Brothers, MD  08/29/2017 3:20 PM    [facility]

## 2017-08-29 NOTE — Telephone Encounter (Signed)
Patient called to schedule heart cath. Cath was scheduled for 08/31/2017 @ 10:30am with Dr. Saunders Revel. Patient was informed to arrive at 8:30am. Instructions for heart cath were given to patient prior to patient leaving office today. [name]

## 2017-08-29 NOTE — Patient Instructions (Signed)
Medication Instructions:  Your physician has recommended you make the following change in your medication:  START Nitroglycerin as needed for chest pain  Labwork: PT/INR today  Testing/Procedures:   Brandon Papillion [address] [place] [address] Dept: [place]: [place]  08/29/2017  You are scheduled for a Cardiac Catheterization on TO BE DETERMINED, at [facility] with Dr. TO BE DETERMINED  1. Please arrive at the [facility] (Main Entrance A) at [facility]: [address] [place], [place] [address] at  (two hours before your procedure to ensure your preparation). Free valet parking service is available.   Special note: Every effort is made to have your procedure done on time. Please understand that emergencies sometimes delay scheduled procedures.  2. Diet: Nothing to eat after midnight  3. Labs: PT/INR 08/29/2017  4. Medication instructions in preparation for your procedure:    Current Facility-Administered Medications (Endocrine & Metabolic):  .  methylPREDNISolone acetate (DEPO-MEDROL) injection 40 mg  Current Outpatient Prescriptions (Cardiovascular):  .  nitroGLYCERIN (NITROSTAT) 0.4 MG SL tablet, Place 1 tablet (0.4 mg total) under the tongue every 5 (five) minutes as needed for chest pain.   Current Outpatient Prescriptions (Respiratory):  .  fluticasone (FLONASE) 50 MCG/ACT nasal spray, USE 1 SPRAY(S) IN EACH NOSTRIL ONCE DAILY   Current Outpatient Prescriptions (Analgesics):  .  aspirin 81 MG tablet, Take 81 mg by mouth at bedtime.  .  meloxicam (MOBIC) 7.5 MG tablet, TAKE 1 TO 2 TABLETS BY MOUTH ONCE DAILY .  traMADol (ULTRAM) 50 MG tablet, TAKE ONE TO TWO TABLETS BY MOUTH TWICE DAILY AS NEEDED FOR PAIN   Current Outpatient Prescriptions (Hematological):  .  cyanocobalamin 500 MCG tablet, Take 1,000 mcg by mouth daily.      Current Outpatient Prescriptions (Other):  .  azithromycin (ZITHROMAX Z-PAK) 250 MG tablet, As directed .  cholecalciferol (VITAMIN D) 400 UNITS TABS, Take 400 Units by mouth daily. .  Multiple Vitamin (MULTIVITAMIN) LIQD, Take 5 mLs by mouth daily. .  Nutritional Supplements (JUICE PLUS FIBRE PO), Take 1 tablet by mouth 2 (two) times daily. .  ondansetron (ZOFRAN) 4 MG tablet, 1-2 tablets every 4-6 hours as needed for nausea .  pantoprazole (PROTONIX) 40 MG tablet, Take 40 mg by mouth daily.   .  valACYclovir (VALTREX) 500 MG tablet, TAKE 1 TABLET BY MOUTH ONCE DAILY  *For reference purposes while preparing patient instructions.   Delete this med list prior to printing instructions for patient.*  None  Stop taking, None     On the morning of your procedure, take your Aspirin and any morning medicines NOT listed above.  You may use sips of water.  5. Plan for one night stay--bring personal belongings. 6. Bring a current list of your medications and current insurance cards. 7. You MUST have a responsible person to drive you home. 8. Someone MUST be with you the first 24 hours after you arrive home or your discharge will be delayed. 9. Please wear clothes that are easy to get on and off and wear slip-on shoes.  Thank you for allowing [place] to care for you!   -- [facility]   Follow-Up: To be determined after heart cath  Any Other Special Instructions Will Be Listed Below (If Applicable).     If you need a refill on your cardiac medications before your next appointment,  please call your pharmacy. '

## 2017-08-30 LAB — PROTIME-INR
INR: 1 (ref 0.8–1.2)
Prothrombin Time: 10.8 s (ref 9.1–12.0)

## 2017-08-30 NOTE — Addendum Note (Signed)
Addended by: Jyl Heinz R on: 08/30/2017 08:43 AM   Modules accepted: Orders

## 2017-08-31 ENCOUNTER — Ambulatory Visit (HOSPITAL_COMMUNITY)
Admission: RE | Disposition: A | Discharge status: Home / Self Care | Payer: Self-pay | Source: Ambulatory Visit | Attending: Internal Medicine

## 2017-08-31 ENCOUNTER — Ambulatory Visit (HOSPITAL_COMMUNITY)
Admission: RE | Admit: 2017-08-31 | Discharge: 2017-08-31 | Disposition: A | Discharge status: Home / Self Care | Payer: Medicare HMO | Source: Ambulatory Visit | Attending: Internal Medicine | Admitting: Internal Medicine

## 2017-08-31 DIAGNOSIS — Z79899 Other long term (current) drug therapy: Secondary | ICD-10-CM | POA: Diagnosis not present

## 2017-08-31 DIAGNOSIS — Z7982 Long term (current) use of aspirin: Secondary | ICD-10-CM | POA: Diagnosis not present

## 2017-08-31 DIAGNOSIS — M1990 Unspecified osteoarthritis, unspecified site: Secondary | ICD-10-CM | POA: Insufficient documentation

## 2017-08-31 DIAGNOSIS — G4733 Obstructive sleep apnea (adult) (pediatric): Secondary | ICD-10-CM | POA: Diagnosis not present

## 2017-08-31 DIAGNOSIS — E785 Hyperlipidemia, unspecified: Secondary | ICD-10-CM | POA: Insufficient documentation

## 2017-08-31 DIAGNOSIS — Z87891 Personal history of nicotine dependence: Secondary | ICD-10-CM | POA: Insufficient documentation

## 2017-08-31 DIAGNOSIS — I25110 Atherosclerotic heart disease of native coronary artery with unstable angina pectoris: Secondary | ICD-10-CM | POA: Diagnosis not present

## 2017-08-31 DIAGNOSIS — K219 Gastro-esophageal reflux disease without esophagitis: Secondary | ICD-10-CM | POA: Diagnosis not present

## 2017-08-31 DIAGNOSIS — I200 Unstable angina: Secondary | ICD-10-CM | POA: Diagnosis present

## 2017-08-31 DIAGNOSIS — I25119 Atherosclerotic heart disease of native coronary artery with unspecified angina pectoris: Secondary | ICD-10-CM | POA: Insufficient documentation

## 2017-08-31 HISTORY — PX: LEFT HEART CATH AND CORONARY ANGIOGRAPHY: CATH118249

## 2017-08-31 SURGERY — LEFT HEART CATH AND CORONARY ANGIOGRAPHY
Anesthesia: LOCAL

## 2017-08-31 MED ORDER — VERAPAMIL HCL 2.5 MG/ML IV SOLN
INTRAVENOUS | Status: DC | PRN
  Administered 2017-08-31: 10 mL via INTRA_ARTERIAL

## 2017-08-31 MED ORDER — HEPARIN (PORCINE) IN NACL 2-0.9 UNIT/ML-% IJ SOLN
INTRAMUSCULAR | Status: AC
  Filled 2017-08-31: qty 1000

## 2017-08-31 MED ORDER — SODIUM CHLORIDE 0.9 % WEIGHT BASED INFUSION: 1.0000 mL/kg/h | INTRAVENOUS | Status: DC

## 2017-08-31 MED ORDER — SODIUM CHLORIDE 0.9 % WEIGHT BASED INFUSION
3.0000 mL/kg/h | INTRAVENOUS | Status: AC
  Administered 2017-08-31: 3 mL/kg/h via INTRAVENOUS

## 2017-08-31 MED ORDER — MIDAZOLAM HCL 2 MG/2ML IJ SOLN
INTRAMUSCULAR | Status: AC
  Filled 2017-08-31: qty 2

## 2017-08-31 MED ORDER — HEPARIN (PORCINE) IN NACL 2-0.9 UNIT/ML-% IJ SOLN
INTRAMUSCULAR | Status: DC | PRN
  Administered 2017-08-31: 12:00:00

## 2017-08-31 MED ORDER — LIDOCAINE HCL 2 % IJ SOLN
INTRAMUSCULAR | Status: AC
  Filled 2017-08-31: qty 10

## 2017-08-31 MED ORDER — SODIUM CHLORIDE 0.9% FLUSH: 3.0000 mL | Freq: Two times a day (BID) | INTRAVENOUS | Status: DC

## 2017-08-31 MED ORDER — SODIUM CHLORIDE 0.9 % IV SOLN: INTRAVENOUS | Status: DC

## 2017-08-31 MED ORDER — FENTANYL CITRATE (PF) 100 MCG/2ML IJ SOLN
INTRAMUSCULAR | Status: AC
  Filled 2017-08-31: qty 2

## 2017-08-31 MED ORDER — SODIUM CHLORIDE 0.9% FLUSH
3.0000 mL | INTRAVENOUS | Status: DC | PRN
Start: 2017-08-31 — End: 2017-08-31

## 2017-08-31 MED ORDER — FENTANYL CITRATE (PF) 100 MCG/2ML IJ SOLN
INTRAMUSCULAR | Status: DC | PRN
  Administered 2017-08-31: 25 ug via INTRAVENOUS
  Administered 2017-08-31: 50 ug via INTRAVENOUS

## 2017-08-31 MED ORDER — HEPARIN SODIUM (PORCINE) 1000 UNIT/ML IJ SOLN
INTRAMUSCULAR | Status: DC | PRN
  Administered 2017-08-31: 5000 [IU] via INTRAVENOUS

## 2017-08-31 MED ORDER — PROMETHAZINE HCL 25 MG/ML IJ SOLN: 12.5000 mg | Freq: Once | INTRAMUSCULAR | Status: DC

## 2017-08-31 MED ORDER — VERAPAMIL HCL 2.5 MG/ML IV SOLN
INTRAVENOUS | Status: AC
  Filled 2017-08-31: qty 2

## 2017-08-31 MED ORDER — SODIUM CHLORIDE 0.9% FLUSH: 3.0000 mL | INTRAVENOUS | Status: DC | PRN

## 2017-08-31 MED ORDER — SODIUM CHLORIDE 0.9 % IV SOLN: 250.0000 mL | INTRAVENOUS | Status: DC | PRN

## 2017-08-31 MED ORDER — HEPARIN SODIUM (PORCINE) 1000 UNIT/ML IJ SOLN
INTRAMUSCULAR | Status: AC
  Filled 2017-08-31: qty 1

## 2017-08-31 MED ORDER — ASPIRIN 81 MG PO CHEW: 81.0000 mg | CHEWABLE_TABLET | ORAL | Status: DC

## 2017-08-31 MED ORDER — MIDAZOLAM HCL 2 MG/2ML IJ SOLN
INTRAMUSCULAR | Status: DC | PRN
  Administered 2017-08-31: 1 mg via INTRAVENOUS

## 2017-08-31 MED ORDER — IOPAMIDOL (ISOVUE-370) INJECTION 76%
INTRAVENOUS | Status: DC | PRN
  Administered 2017-08-31: 75 mL via INTRA_ARTERIAL

## 2017-08-31 MED ORDER — LIDOCAINE HCL (PF) 1 % IJ SOLN
INTRAMUSCULAR | Status: DC | PRN
  Administered 2017-08-31: 2 mL

## 2017-08-31 SURGICAL SUPPLY — 10 items
CATH IMPULSE 5F ANG/FL3.5 (CATHETERS) ×1 IMPLANT
DEVICE RAD COMP TR BAND LRG (VASCULAR PRODUCTS) ×1 IMPLANT
GLIDESHEATH SLEND SS 6F .021 (SHEATH) ×1 IMPLANT
GUIDEWIRE INQWIRE 1.5J.035X260 (WIRE) IMPLANT
INQWIRE 1.5J .035X260CM (WIRE) ×2
KIT HEART LEFT (KITS) ×2 IMPLANT
PACK CARDIAC CATHETERIZATION (CUSTOM PROCEDURE TRAY) ×2 IMPLANT
SYR MEDRAD MARK V 150ML (SYRINGE) ×2 IMPLANT
TRANSDUCER W/STOPCOCK (MISCELLANEOUS) ×2 IMPLANT
TUBING CIL FLEX 10 FLL-RA (TUBING) ×2 IMPLANT

## 2017-08-31 NOTE — Research (Addendum)
OPTIMIZE Informed Consent   Subject Name: [name]  Subject met inclusion and exclusion criteria.  The informed consent form, study requirements and expectations were reviewed with the subject and questions and concerns were addressed prior to the signing of the consent form.  The subject verbalized understanding of the trail requirements.  The subject agreed to participate in the OPTIMIZE trial and signed the informed consent.  The informed consent was obtained prior to performance of any protocol-specific procedures for the subject.  A copy of the signed informed consent was given to the subject and a copy was placed in the subject's medical record. Only applicable if randomized.  Philemon Kingdom D 08/31/2017, 1010 AM

## 2017-08-31 NOTE — Discharge Instructions (Signed)
**Note -identified via Obfuscation** Radial Site Care °Refer to this sheet in the next few weeks. These instructions provide you with information about caring for yourself after your procedure. Your health care provider may also give you more specific instructions. Your treatment has been planned according to current medical practices, but problems sometimes occur. Call your health care provider if you have any problems or questions after your procedure. °What can I expect after the procedure? °After your procedure, it is typical to have the following: °· Bruising at the radial site that usually fades within 1-2 weeks. °· Blood collecting in the tissue (hematoma) that may be painful to the touch. It should usually decrease in size and tenderness within 1-2 weeks. ° °Follow these instructions at home: °· Take medicines only as directed by your health care provider. °· You may shower 24-48 hours after the procedure or as directed by your health care provider. Remove the bandage (dressing) and gently wash the site with plain soap and water. Pat the area dry with a clean towel. Do not rub the site, because this may cause bleeding. °· Do not take baths, swim, or use a hot tub until your health care provider approves. °· Check your insertion site every day for redness, swelling, or drainage. °· Do not apply powder or lotion to the site. °· Do not flex or bend the affected arm for 24 hours or as directed by your health care provider. °· Do not push or pull heavy objects with the affected arm for 24 hours or as directed by your health care provider. °· Do not lift over 10 lb (4.5 kg) for 5 days after your procedure or as directed by your health care provider. °· Ask your health care provider when it is okay to: °? Return to work or school. °? Resume usual physical activities or sports. °? Resume sexual activity. °· Do not drive home if you are discharged the same day as the procedure. Have someone else drive you. °· You may drive 24 hours after the procedure  unless otherwise instructed by your health care provider. °· Do not operate machinery or power tools for 24 hours after the procedure. °· If your procedure was done as an outpatient procedure, which means that you went home the same day as your procedure, a responsible adult should be with you for the first 24 hours after you arrive home. °· Keep all follow-up visits as directed by your health care provider. This is important. °Contact a health care provider if: °· You have a fever. °· You have chills. °· You have increased bleeding from the radial site. Hold pressure on the site. °Get help right away if: °· You have unusual pain at the radial site. °· You have redness, warmth, or swelling at the radial site. °· You have drainage (other than a small amount of blood on the dressing) from the radial site. °· The radial site is bleeding, and the bleeding does not stop after 30 minutes of holding steady pressure on the site. °· Your arm or hand becomes pale, cool, tingly, or numb. °This information is not intended to replace advice given to you by your health care provider. Make sure you discuss any questions you have with your health care provider. °Document Released: 12/03/2010 Document Revised: 04/07/2016 Document Reviewed: 05/19/2014 °Elsevier Interactive Patient Education © 2018 [employer] ° °

## 2017-08-31 NOTE — H&P (View-Only) (Signed)
Cardiology Office Note:    Date:  08/29/2017   ID:  [name], DOB 1951/07/01, MRN [id number]  PCP:  Tresa Garter, MD  Cardiologist:  Garwin Brothers, MD   Referring MD: Tresa Garter, MD    ASSESSMENT:    1. OSA (obstructive sleep apnea)   2. Dyslipidemia   3. Angina pectoris (HCC)    PLAN:    In order of problems listed above:  1. I discussed my findings with the patient. His symptoms are very concerning.In view of the patient's symptoms, I discussed with the patient options for evaluation. Invasive and noninvasive options were given to the patient. I discussed stress testing and coronary angiography and left heart catheterization at length. Benefits, pros and cons of each approach were discussed at length. Patient had multiple questions which were answered to the patient's satisfaction. Patient opted for invasive evaluation and we will set up for coronary angiography and left heart catheterization. Further recommendations will be made based on the findings with coronary angiography. In the interim if the patient has any significant symptoms in hospital to the nearest emergency room. 2. Patient's blood pressure stable and lipids were reviewed and discussed with him and these are followed by his primary care physician. Sublingual nitroglycerin prescription was sent is protocol and 911 protocol explained.   Medication Adjustments/Labs and Tests Ordered: Current medicines are reviewed at length with the patient today.  Concerns regarding medicines are outlined above.  Orders Placed This Encounter  Procedures  . INR/PT   Meds ordered this encounter  Medications  . nitroGLYCERIN (NITROSTAT) 0.4 MG SL tablet    Sig: Place 1 tablet (0.4 mg total) under the tongue every 5 (five) minutes as needed for chest pain.    Dispense:  11 tablet    Refill:  6     History of Present Illness:    [name] is a 66 y.o. male who is being seen today for the evaluation  of chest pain suggesting angina at the request of Plotnikov, Georgina Quint, MD. Patient is a pleasant 66 year old male. He has past medical history of dyslipidemia.he mentions to me that he's been having substernal chest tightness at times occasionally it radiates to the neck and to the arms. He was evaluated at the hospital for similar symptoms and discharged. He is here for evaluation of the symptoms as they're affecting his quality of life and his confidence. At the time of my evaluation is alert awake oriented and in no distress.  Past Medical History:  Diagnosis Date  . Actinic keratosis   . Colonic polyp   . COLONIC POLYPS, HX OF   . CONTACT DERMATITIS DUE TO SOLVENTS   . DOE (dyspnea on exertion) 07-25-12   related to eating shrimp  . GERD (gastroesophageal reflux disease)   . HAND PAIN   . HERPES, GENITAL NOS   . Ingrowing toenail of left foot 07-25-12   is resolved now 07-25-12  . KNEE PAIN   . Neoplasm of uncertain behavior of skin   . OA (osteoarthritis) of knee   . ONYCHOMYCOSIS   . OSTEOARTHRITIS   . Preop exam for internal medicine   . SINUSITIS, ACUTE   . Well adult exam     Past Surgical History:  Procedure Laterality Date  . JOINT REPLACEMENT  9/13   L TKR  . KNEE ARTHROSCOPY     Left  . TOTAL KNEE ARTHROPLASTY  07/31/2012   Procedure: TOTAL KNEE ARTHROPLASTY;  Surgeon:  Eugenia Mcalpine, MD;  Location: [facility];  Service: Orthopedics;  Laterality: Left;  . UMBILICAL HERNIA REPAIR  07-25-12   10 yrs ago    Current Medications: Current Meds  Medication Sig  . aspirin 81 MG tablet Take 81 mg by mouth at bedtime.   [name] azithromycin (ZITHROMAX Z-PAK) 250 MG tablet As directed  . cholecalciferol (VITAMIN D) 400 UNITS TABS Take 400 Units by mouth daily.  . cyanocobalamin 500 MCG tablet Take 1,000 mcg by mouth daily.   . fluticasone (FLONASE) 50 MCG/ACT nasal spray USE 1 SPRAY(S) IN EACH NOSTRIL ONCE DAILY  . meloxicam (MOBIC) 7.5 MG tablet TAKE 1 TO 2 TABLETS BY MOUTH ONCE  DAILY  . Multiple Vitamin (MULTIVITAMIN) LIQD Take 5 mLs by mouth daily.  . Nutritional Supplements (JUICE PLUS FIBRE PO) Take 1 tablet by mouth 2 (two) times daily.  . ondansetron (ZOFRAN) 4 MG tablet 1-2 tablets every 4-6 hours as needed for nausea  . pantoprazole (PROTONIX) 40 MG tablet Take 40 mg by mouth daily.    . traMADol (ULTRAM) 50 MG tablet TAKE ONE TO TWO TABLETS BY MOUTH TWICE DAILY AS NEEDED FOR PAIN  . valACYclovir (VALTREX) 500 MG tablet TAKE 1 TABLET BY MOUTH ONCE DAILY   Current Facility-Administered Medications for the 08/29/17 encounter (Office Visit) with Tawny Raspberry, Aundra Dubin, MD  Medication  . methylPREDNISolone acetate (DEPO-MEDROL) injection 40 mg     Allergies:   Hytrin [terazosin]; Lipitor [atorvastatin]; Penicillins; and Reglan [metoclopramide]   Social History   Social History  . Marital status: Married    Spouse name: N/A  . Number of children: N/A  . Years of education: N/A   Social History Main Topics  . Smoking status: Former Smoker    Types: Cigars    Quit date: 08/30/2007  . Smokeless tobacco: Never Used     Comment: occ. cigar  . Alcohol use Yes     Comment: occ. socially  . Drug use: Yes    Types: Marijuana     Comment: Marijuana socially   . Sexual activity: Yes    Partners: Female    Birth control/ protection: None   Other Topics Concern  . None   Social History Narrative  . None     Family History: The patient's family history includes Colon polyps in his brother; Coronary artery disease in his other; Diabetes in his mother; Heart disease (age of onset: 69) in his father; Lung cancer in his brother. There is no history of Colon cancer or Stomach cancer.  ROS:   Please see the history of present illness.    All other systems reviewed and are negative.  EKGs/Labs/Other Studies Reviewed:    The following studies were reviewed today: I reviewed his records and discussed findings with the patient at extensive length. EKG was  reviewed and reveals sinus rhythm with nonspecific ST-T changes   Recent Labs: 07/12/2017: ALT 29; TSH 1.42 08/21/2017: BUN 15; Creatinine, Ser 0.97; Hemoglobin 14.0; Platelets 214; Potassium 4.0; Sodium 140  Recent Lipid Panel    Component Value Date/Time   CHOL 225 (H) 07/12/2017 0732   TRIG 159.0 (H) 07/12/2017 0732   HDL 38.80 (L) 07/12/2017 0732   CHOLHDL 6 07/12/2017 0732   VLDL 31.8 07/12/2017 0732   LDLCALC 154 (H) 07/12/2017 0732   LDLDIRECT 102.0 06/10/2015 0730    Physical Exam:    VS:  BP 130/70   Pulse 68   Ht 6' (1.829 m)   Wt 228 lb 1.9  oz (103.5 kg)   SpO2 97%   BMI 30.94 kg/m     Wt Readings from Last 3 Encounters:  08/29/17 228 lb 1.9 oz (103.5 kg)  08/24/17 224 lb (101.6 kg)  08/21/17 223 lb (101.2 kg)     GEN: Patient is in no acute distress HEENT: Normal NECK: No JVD; No carotid bruits LYMPHATICS: No lymphadenopathy CARDIAC: S1 S2 regular, 2/6 systolic murmur at the apex. RESPIRATORY:  Clear to auscultation without rales, wheezing or rhonchi  ABDOMEN: Soft, non-tender, non-distended MUSCULOSKELETAL:  No edema; No deformity  SKIN: Warm and dry NEUROLOGIC:  Alert and oriented x 3 PSYCHIATRIC:  Normal affect    Signed, Garwin Brothers, MD  08/29/2017 3:20 PM    [facility]

## 2017-08-31 NOTE — Interval H&P Note (Signed)
History and Physical Interval Note:  08/31/2017 11:34 AM  [name]  has presented today for cardiac catheterization, with the diagnosis of accelerating angina.  The various methods of treatment have been discussed with the patient and family. After consideration of risks, benefits and other options for treatment, the patient has consented to  Procedure(s): LEFT HEART CATH AND CORONARY ANGIOGRAPHY (N/A) as a surgical intervention .  The patient's history has been reviewed, patient examined, no change in status, stable for surgery.  I have reviewed the patient's chart and labs.  Questions were answered to the patient's satisfaction.    Cath Lab Visit (complete for each Cath Lab visit)  Clinical Evaluation Leading to the Procedure:   ACS: No.  Non-ACS:    Anginal Classification: CCS III  Anti-ischemic medical therapy: No Therapy  Non-Invasive Test Results: No non-invasive testing performed  Prior CABG: No previous CABG  [name]

## 2017-09-01 ENCOUNTER — Encounter (HOSPITAL_COMMUNITY): Payer: Self-pay | Admitting: Internal Medicine

## 2017-09-01 MED FILL — Lidocaine HCl Local Inj 2%: INTRAMUSCULAR | Qty: 10 | Status: AC

## 2017-09-11 ENCOUNTER — Telehealth: Payer: Self-pay

## 2017-09-11 NOTE — Telephone Encounter (Signed)
Followed up phone call to schedule appointment with Dr. Arvella Gill post cath. Patient did not have any questions regarding his heart cath and understands the necessity of appointment post heart cath.

## 2017-09-14 ENCOUNTER — Ambulatory Visit (INDEPENDENT_AMBULATORY_CARE_PROVIDER_SITE_OTHER): Payer: Medicare HMO | Admitting: Cardiology

## 2017-09-14 ENCOUNTER — Encounter: Payer: Self-pay | Admitting: Cardiology

## 2017-09-14 VITALS — BP 150/98 | HR 86 | Resp 16 | Ht 72.0 in | Wt 228.1 lb

## 2017-09-14 DIAGNOSIS — I2510 Atherosclerotic heart disease of native coronary artery without angina pectoris: Secondary | ICD-10-CM | POA: Diagnosis not present

## 2017-09-14 DIAGNOSIS — E785 Hyperlipidemia, unspecified: Secondary | ICD-10-CM

## 2017-09-14 NOTE — Progress Notes (Signed)
Cardiology Office Note:    Date:  09/14/2017   ID:  [name], DOB 15-Feb-1951, MRN [id number]  PCP:  Cassandria Anger, MD  Cardiologist:  Jenean Lindau, MD   Referring MD: Cassandria Anger, MD    ASSESSMENT:    1. Coronary artery disease involving native coronary artery of native heart without angina pectoris   2. Dyslipidemia    PLAN:    In order of problems listed above:  1. Secondary prevention stressed to the patient. Importance of compliance with diet and medications stressed and he vocalized understanding. 2. Coronary angiography report was discussed with the patient. I mentioned to him that he needs to be on statin therapy or some lipid lowering therapy. He wants to consult with his primary medical care doctor about this condition. I respect his wishes.he was advised to take coated aspirin 81 mg on a daily basis. 3. Will be seen in follow-up appointment on a when necessary basis only.   Medication Adjustments/Labs and Tests Ordered: Current medicines are reviewed at length with the patient today.  Concerns regarding medicines are outlined above.  No orders of the defined types were placed in this encounter.  No orders of the defined types were placed in this encounter.    Chief Complaint  Patient presents with  . Follow-up    Follow up from cath; complains of fast heart rate and shaking     History of Present Illness:    [name] is a 66 y.o. male . The patient was evaluated by me for significant episodes of chest pain suggesting angina and underwent coronary angiography. Fortunately the coronary angiography revealed only nonobstructive coronary artery disease. Patent denies problems at this time. No chest pain orthopnea or PND He tells me that he has issues with anxiety. He is happy about the results of his coronary angiography.  Past Medical History:  Diagnosis Date  . Actinic keratosis   . Colonic polyp   . COLONIC POLYPS, HX OF   .  CONTACT DERMATITIS DUE TO SOLVENTS   . DOE (dyspnea on exertion) 07-25-12   related to eating shrimp  . GERD (gastroesophageal reflux disease)   . HAND PAIN   . HERPES, GENITAL NOS   . Ingrowing toenail of left foot 07-25-12   is resolved now 07-25-12  . KNEE PAIN   . Neoplasm of uncertain behavior of skin   . OA (osteoarthritis) of knee   . ONYCHOMYCOSIS   . OSTEOARTHRITIS   . Preop exam for internal medicine   . SINUSITIS, ACUTE   . Well adult exam     Past Surgical History:  Procedure Laterality Date  . JOINT REPLACEMENT  9/13   L TKR  . KNEE ARTHROSCOPY     Left  . LEFT HEART CATH AND CORONARY ANGIOGRAPHY N/A 08/31/2017   Procedure: LEFT HEART CATH AND CORONARY ANGIOGRAPHY;  Surgeon: Nelva Bush, MD;  Location: [facility];  Service: Cardiovascular;  Laterality: N/A;  . TOTAL KNEE ARTHROPLASTY  07/31/2012   Procedure: TOTAL KNEE ARTHROPLASTY;  Surgeon: Sydnee Cabal, MD;  Location: [facility];  Service: Orthopedics;  Laterality: Left;  . UMBILICAL HERNIA REPAIR  07-25-12   10 yrs ago    Current Medications: Current Meds  Medication Sig  . Alum Hydroxide-Mag Carbonate (GAVISCON PO) Take 1 tablet by mouth daily as needed (indigestion).  . Ascorbic Acid (VITAMIN C) 1000 MG tablet Take 1,000 mg by mouth daily.  [name] aspirin 81 MG tablet Take  81 mg by mouth at bedtime.   . cholecalciferol (VITAMIN D) 1000 units tablet Take 1,000 Units by mouth daily.  . Clotrimazole (LOTRIMIN AF JOCK ITCH EX) Apply 1 application topically daily as needed (sweat).  . Cyanocobalamin (VITAMIN B-12) 2500 MCG SUBL Place 5,000 mcg under the tongue daily.  . meloxicam (MOBIC) 7.5 MG tablet TAKE 1 TO 2 TABLETS BY MOUTH ONCE DAILY (Patient taking differently: Take 15 mg by mouth once daily)  . Multiple Vitamin (MULTIVITAMIN WITH MINERALS) TABS tablet Take 1 tablet by mouth daily.  . nitroGLYCERIN (NITROSTAT) 0.4 MG SL tablet Place 1 tablet (0.4 mg total) under the tongue every 5 (five) minutes as  needed for chest pain.  . Nutritional Supplements (JUICE PLUS FIBRE PO) Take 3 tablets by mouth 2 (two) times daily.   . ondansetron (ZOFRAN) 4 MG tablet 1-2 tablets every 4-6 hours as needed for nausea  . pantoprazole (PROTONIX) 40 MG tablet Take 40 mg by mouth 2 (two) times daily.   . Red Yeast Rice 600 MG TABS Take 1,200 mg by mouth daily.  . traMADol (ULTRAM) 50 MG tablet TAKE ONE TO TWO TABLETS BY MOUTH TWICE DAILY AS NEEDED FOR PAIN  . Turmeric 500 MG TABS Take 500 mg by mouth 2 (two) times daily.  . valACYclovir (VALTREX) 500 MG tablet TAKE 1 TABLET BY MOUTH ONCE DAILY     Allergies:   Hytrin [terazosin]; Lipitor [atorvastatin]; Penicillins; and Reglan [metoclopramide]   Social History   Social History  . Marital status: Married    Spouse name: N/A  . Number of children: N/A  . Years of education: N/A   Social History Main Topics  . Smoking status: Former Smoker    Types: Cigars    Quit date: 08/30/2007  . Smokeless tobacco: Never Used     Comment: occ. cigar  . Alcohol use Yes     Comment: occ. socially  . Drug use: Yes    Types: Marijuana     Comment: Marijuana socially   . Sexual activity: Yes    Partners: Female    Birth control/ protection: None   Other Topics Concern  . None   Social History Narrative  . None     Family History: The patient's family history includes Colon polyps in his brother; Coronary artery disease in his other; Diabetes in his mother; Heart disease (age of onset: 9) in his father; Lung cancer in his brother. There is no history of Colon cancer or Stomach cancer.  ROS:   Please see the history of present illness.    All other systems reviewed and are negative.  EKGs/Labs/Other Studies Reviewed:    The following studies were reviewed today: I discussed the findings with coronary angiography with the patient at length.   Recent Labs: 07/12/2017: ALT 29; TSH 1.42 08/21/2017: BUN 15; Creatinine, Ser 0.97; Hemoglobin 14.0; Platelets  214; Potassium 4.0; Sodium 140  Recent Lipid Panel    Component Value Date/Time   CHOL 225 (H) 07/12/2017 0732   TRIG 159.0 (H) 07/12/2017 0732   HDL 38.80 (L) 07/12/2017 0732   CHOLHDL 6 07/12/2017 0732   VLDL 31.8 07/12/2017 0732   LDLCALC 154 (H) 07/12/2017 0732   LDLDIRECT 102.0 06/10/2015 0730    Physical Exam:    VS:  BP (!) 150/98 (BP Location: Right Arm, Patient Position: Sitting)   Pulse 86   Resp 16   Ht 6' (1.829 m)   Wt 228 lb 1.3 oz (103.5 kg)  SpO2 98%   BMI 30.93 kg/m     Wt Readings from Last 3 Encounters:  09/14/17 228 lb 1.3 oz (103.5 kg)  08/31/17 225 lb (102.1 kg)  08/29/17 228 lb 1.9 oz (103.5 kg)     GEN: Patient is in no acute distress HEENT: Normal NECK: No JVD; No carotid bruits LYMPHATICS: No lymphadenopathy CARDIAC: Hear sounds regular, 2/6 systolic murmur at the apex. RESPIRATORY:  Clear to auscultation without rales, wheezing or rhonchi  ABDOMEN: Soft, non-tender, non-distended MUSCULOSKELETAL:  No edema; No deformity  SKIN: Warm and dry NEUROLOGIC:  Alert and oriented x 3 PSYCHIATRIC:  Normal affect   Signed, Jenean Lindau, MD  09/14/2017 10:08 AM    [place]

## 2017-09-14 NOTE — Patient Instructions (Signed)
Medication Instructions:  Your physician recommends that you continue on your current medications as directed. Please refer to the Current Medication list given to you today.   Labwork: None  Testing/Procedures: None  Follow-Up: Your physician recommends that you schedule a follow-up appointment as needed  Any Other Special Instructions Will Be Listed Below (If Applicable).     If you need a refill on your cardiac medications before your next appointment, please call your pharmacy.   [place], RN, BSN

## 2017-09-15 ENCOUNTER — Encounter: Payer: Self-pay | Admitting: Internal Medicine

## 2017-09-15 ENCOUNTER — Ambulatory Visit (INDEPENDENT_AMBULATORY_CARE_PROVIDER_SITE_OTHER): Payer: Medicare HMO | Admitting: Internal Medicine

## 2017-09-15 DIAGNOSIS — I2510 Atherosclerotic heart disease of native coronary artery without angina pectoris: Secondary | ICD-10-CM | POA: Diagnosis not present

## 2017-09-15 DIAGNOSIS — R251 Tremor, unspecified: Secondary | ICD-10-CM

## 2017-09-15 DIAGNOSIS — E785 Hyperlipidemia, unspecified: Secondary | ICD-10-CM | POA: Diagnosis not present

## 2017-09-15 DIAGNOSIS — F419 Anxiety disorder, unspecified: Secondary | ICD-10-CM

## 2017-09-15 HISTORY — DX: Tremor, unspecified: R25.1

## 2017-09-15 MED ORDER — ALPRAZOLAM 0.5 MG PO TABS: 0.5000 mg | ORAL_TABLET | Freq: Two times a day (BID) | ORAL | 1 refills | Status: DC | PRN

## 2017-09-15 MED ORDER — ALPRAZOLAM 0.5 MG PO TBDP: 0.5000 mg | ORAL_TABLET | Freq: Two times a day (BID) | ORAL | 1 refills | Status: DC | PRN

## 2017-09-15 NOTE — Progress Notes (Signed)
Subjective:  Patient ID: [name], male    DOB: 1951/08/13  Age: 66 y.o. MRN: [id number]  CC: No chief complaint on file.   HPI [name] presents for a cluster of sx's the pt is attributing to Reglan withdrawal (he had his last Reglan dose in early September 2018): tremor, twitching, anxiety, depression, fatigue, pounding heart, blinking, lip smacking etc... Heart cath was OK - CAD w/o critical obstruction  Outpatient Medications Prior to Visit  Medication Sig Dispense Refill  . Alum Hydroxide-Mag Carbonate (GAVISCON PO) Take 1 tablet by mouth daily as needed (indigestion).    . Ascorbic Acid (VITAMIN C) 1000 MG tablet Take 1,000 mg by mouth daily.    [name] aspirin 81 MG tablet Take 81 mg by mouth at bedtime.     . cholecalciferol (VITAMIN D) 1000 units tablet Take 1,000 Units by mouth daily.    . Clotrimazole (LOTRIMIN AF JOCK ITCH EX) Apply 1 application topically daily as needed (sweat).    . Cyanocobalamin (VITAMIN B-12) 2500 MCG SUBL Place 5,000 mcg under the tongue daily.    . fluticasone (FLONASE) 50 MCG/ACT nasal spray USE 1 SPRAY(S) IN EACH NOSTRIL ONCE DAILY (Patient taking differently: USE 1 SPRAY(S) IN EACH NOSTRIL ONCE DAILY AS NEEDED FOR ALLERGIES) 16 g 1  . meloxicam (MOBIC) 7.5 MG tablet TAKE 1 TO 2 TABLETS BY MOUTH ONCE DAILY (Patient taking differently: Take 15 mg by mouth once daily) 180 tablet 0  . Multiple Vitamin (MULTIVITAMIN WITH MINERALS) TABS tablet Take 1 tablet by mouth daily.    . nitroGLYCERIN (NITROSTAT) 0.4 MG SL tablet Place 1 tablet (0.4 mg total) under the tongue every 5 (five) minutes as needed for chest pain. 11 tablet 6  . Nutritional Supplements (JUICE PLUS FIBRE PO) Take 3 tablets by mouth 2 (two) times daily.     . ondansetron (ZOFRAN) 4 MG tablet 1-2 tablets every 4-6 hours as needed for nausea 30 tablet 3  . pantoprazole (PROTONIX) 40 MG tablet Take 40 mg by mouth 2 (two) times daily.     . Red Yeast Rice 600 MG TABS Take 1,200 mg by mouth  daily.    . traMADol (ULTRAM) 50 MG tablet TAKE ONE TO TWO TABLETS BY MOUTH TWICE DAILY AS NEEDED FOR PAIN 120 tablet 3  . Turmeric 500 MG TABS Take 500 mg by mouth 2 (two) times daily.    . valACYclovir (VALTREX) 500 MG tablet TAKE 1 TABLET BY MOUTH ONCE DAILY 90 tablet 1   No facility-administered medications prior to visit.     ROS Review of Systems  Constitutional: Positive for fatigue. Negative for appetite change and unexpected weight change.  HENT: Positive for congestion and sinus pressure. Negative for nosebleeds, sneezing, sore throat and trouble swallowing.   Eyes: Negative for itching and visual disturbance.  Respiratory: Negative for cough.   Cardiovascular: Positive for chest pain and palpitations. Negative for leg swelling.  Gastrointestinal: Negative for abdominal distention, blood in stool, diarrhea and nausea.  Genitourinary: Negative for frequency and hematuria.  Musculoskeletal: Positive for arthralgias and back pain. Negative for gait problem, joint swelling and neck pain.  Skin: Negative for rash.  Neurological: Positive for dizziness, tremors, weakness, light-headedness and numbness. Negative for speech difficulty.  Psychiatric/Behavioral: Positive for decreased concentration, dysphoric mood and sleep disturbance. Negative for agitation and suicidal ideas. The patient is nervous/anxious.     Objective:  BP 136/78 (BP Location: Left Arm, Patient Position: Sitting, Cuff Size: Large)   Pulse  80   Temp 98.3 F (36.8 C) (Oral)   Ht 6' (1.829 m)   Wt 229 lb (103.9 kg)   SpO2 98%   BMI 31.06 kg/m   BP Readings from Last 3 Encounters:  09/15/17 136/78  09/14/17 (!) 150/98  08/31/17 130/77    Wt Readings from Last 3 Encounters:  09/15/17 229 lb (103.9 kg)  09/14/17 228 lb 1.3 oz (103.5 kg)  08/31/17 225 lb (102.1 kg)    Physical Exam  Constitutional: He is oriented to person, place, and time. He appears well-developed. No distress.  NAD  HENT:    Mouth/Throat: Oropharynx is clear and moist.  Eyes: Pupils are equal, round, and reactive to light. Conjunctivae are normal.  Neck: Normal range of motion. No JVD present. No thyromegaly present.  Cardiovascular: Normal rate, regular rhythm, normal heart sounds and intact distal pulses.  Exam reveals no gallop and no friction rub.   No murmur heard. Pulmonary/Chest: Effort normal and breath sounds normal. No respiratory distress. He has no wheezes. He has no rales. He exhibits no tenderness.  Abdominal: Soft. Bowel sounds are normal. He exhibits no distension and no mass. There is no tenderness. There is no rebound and no guarding.  Musculoskeletal: Normal range of motion. He exhibits no edema or tenderness.  Lymphadenopathy:    He has no cervical adenopathy.  Neurological: He is alert and oriented to person, place, and time. He has normal reflexes. No cranial nerve deficit. He exhibits normal muscle tone. He displays a negative Romberg sign. Coordination and gait normal.  Skin: Skin is warm and dry. No rash noted.  Psychiatric: His behavior is normal. Judgment and thought content normal.  anxious Hand tremor  Lab Results  Component Value Date   WBC 7.3 08/21/2017   HGB 14.0 08/21/2017   HCT 41.4 08/21/2017   PLT 214 08/21/2017   GLUCOSE 114 (H) 08/21/2017   CHOL 225 (H) 07/12/2017   TRIG 159.0 (H) 07/12/2017   HDL 38.80 (L) 07/12/2017   LDLDIRECT 102.0 06/10/2015   LDLCALC 154 (H) 07/12/2017   ALT 29 07/12/2017   AST 18 07/12/2017   NA 140 08/21/2017   K 4.0 08/21/2017   CL 108 08/21/2017   CREATININE 0.97 08/21/2017   BUN 15 08/21/2017   CO2 27 08/21/2017   TSH 1.42 07/12/2017   PSA 0.84 07/12/2017   INR 1.0 08/29/2017   HGBA1C 5.4 06/16/2016    No results found.  Assessment & Plan:   There are no diagnoses linked to this encounter. I am having Mr. [name] maintain his aspirin, pantoprazole, traMADol, fluticasone, valACYclovir, ondansetron, meloxicam, Nutritional  Supplements (JUICE PLUS FIBRE PO), nitroGLYCERIN, vitamin C, multivitamin with minerals, Vitamin B-12, Red Yeast Rice, Turmeric, cholecalciferol, Alum Hydroxide-Mag Carbonate (GAVISCON PO), and Clotrimazole (LOTRIMIN AF JOCK ITCH EX).  No orders of the defined types were placed in this encounter.    Follow-up: No Follow-up on file.  Walker Kehr, MD

## 2017-09-15 NOTE — Assessment & Plan Note (Signed)
x's the pt is attributing to Reglan withdrawal (he had his last Reglan dose in early September 2018): tremor, twitching, anxiety, depression, fatigue, pounding heart, blinking, lip smacking etc...  Discussed Xanax prn  Potential benefits of a long term benzodiazepines  use as well as potential risks  and complications were explained to the patient and were aknowledged. Neurol ref

## 2017-09-15 NOTE — Assessment & Plan Note (Signed)
Dr Geraldo Pitter - heart cath w/CAD

## 2017-09-15 NOTE — Assessment & Plan Note (Addendum)
Worse Xanax prn  Potential benefits of a long term benzodiazepines  use as well as potential risks  and complications were explained to the patient and were aknowledged. Not smoking pot Neurol ref

## 2017-09-15 NOTE — Assessment & Plan Note (Signed)
Red Rice yeast F/u w/cardiology

## 2017-09-21 NOTE — Progress Notes (Signed)
[name] was seen today in the movement disorders clinic for neurologic consultation at the request of Plotnikov, Evie Lacks, MD.  The consultation is for the evaluation of a multitude of symptoms that he attributes to Reglan.  He started on it July, 2012.  The patient has been off of Reglan since November, 2017 per records but patient reports that he went back on it and has only been off of it since september.  It was discontinued due to chest pain the first time.  States that when he quit it cold [place] he had a lot of stomach pain and had to go back on it at a lower dosage.  I have reviewed numerous records made available to me.  Record review took 40 minutes.  I have reviewed primary care records, ER records and gastroenterology records.   Pt states today that he attributes numerous symptoms to Reglan including chest pain, jerking of the left leg, tremor, twitching, anxiety, depression, weight gain, teeth chattering,  palpitations and lip smacking.  Feels that he is having more jerking with tying shoes.  He also states that he "blinks" frequently and it is due to the medication.     Specific Symptoms:  Family hx of similar:  No. Voice: no change in voice - "I felt good until I overdosed on reglan and was told to take 6 per day." Sleep: trouble with sleep.  Uses tramadol to get to sleep which helps with knee pain.    Vivid Dreams:  No.  Acting out dreams:  No. Wet Pillows: No. Postural symptoms:  No.  Falls?  Yes.  , did slip in the yard but "I was put on xanax" Bradykinesia symptoms: difficulty getting out of a chair (due to trouble with knee and back - has "bad discs in back") Loss of smell:  No. Loss of taste:  No. Urinary Incontinence:  No. Difficulty Swallowing:  Yes.  , attributes to hiatal hernia Handwriting, micrographia: No. Trouble with ADL's:  Yes.  , due to tremulousness  Trouble buttoning clothing: No. Depression:  Yes.   and anxiety (states that poor motivation to even  do hobbies) Memory changes:  Yes.   N/V:  No. Lightheaded:  No.  Syncope: No. Diplopia:  No. Dyskinesia:  No.  Neuroimaging of the brain has not previously been performed.      ALLERGIES:   Allergies  Allergen Reactions  . Hytrin [Terazosin] Other (See Comments)    "Makes me jittery"  . Lipitor [Atorvastatin]     myalgias  . Penicillins Other (See Comments)    Convulsions Has patient had a PCN reaction causing immediate rash, facial/tongue/throat swelling, SOB or lightheadedness with hypotension: No Has patient had a PCN reaction causing severe rash involving mucus membranes or skin necrosis: No Has patient had a PCN reaction that required hospitalization: Yes Has patient had a PCN reaction occurring within the last 10 years: No If all of the above answers are "NO", then may proceed with Cephalosporin use.   . Reglan [Metoclopramide]     Jerking, pains, CP    CURRENT MEDICATIONS:  Outpatient Encounter Medications as of 09/25/2017  Medication Sig  . ALPRAZolam (XANAX) 0.5 MG tablet Take 1 tablet (0.5 mg total) by mouth 2 (two) times daily as needed for anxiety.  . Alum Hydroxide-Mag Carbonate (GAVISCON PO) Take 1 tablet by mouth daily as needed (indigestion).  . Ascorbic Acid (VITAMIN C) 1000 MG tablet Take 1,000 mg by mouth daily.  [name] aspirin  81 MG tablet Take 81 mg by mouth at bedtime.   . cholecalciferol (VITAMIN D) 1000 units tablet Take 1,000 Units by mouth daily.  . Clotrimazole (LOTRIMIN AF JOCK ITCH EX) Apply 1 application topically daily as needed (sweat).  . Cyanocobalamin (VITAMIN B-12) 2500 MCG SUBL Place 5,000 mcg under the tongue daily.  . fluticasone (FLONASE) 50 MCG/ACT nasal spray USE 1 SPRAY(S) IN EACH NOSTRIL ONCE DAILY (Patient taking differently: USE 1 SPRAY(S) IN EACH NOSTRIL ONCE DAILY AS NEEDED FOR ALLERGIES)  . meloxicam (MOBIC) 7.5 MG tablet TAKE 1 TO 2 TABLETS BY MOUTH ONCE DAILY (Patient taking differently: Take 15 mg by mouth once daily)  .  Multiple Vitamin (MULTIVITAMIN WITH MINERALS) TABS tablet Take 1 tablet by mouth daily.  . Nutritional Supplements (JUICE PLUS FIBRE PO) Take 3 tablets by mouth 2 (two) times daily.   . ondansetron (ZOFRAN) 4 MG tablet 1-2 tablets every 4-6 hours as needed for nausea  . pantoprazole (PROTONIX) 40 MG tablet Take 40 mg by mouth 2 (two) times daily.   . Red Yeast Rice 600 MG TABS Take 1,200 mg by mouth daily.  . traMADol (ULTRAM) 50 MG tablet TAKE ONE TO TWO TABLETS BY MOUTH TWICE DAILY AS NEEDED FOR PAIN  . Turmeric 500 MG TABS Take 500 mg by mouth 2 (two) times daily.  . valACYclovir (VALTREX) 500 MG tablet TAKE 1 TABLET BY MOUTH ONCE DAILY  . nitroGLYCERIN (NITROSTAT) 0.4 MG SL tablet Place 1 tablet (0.4 mg total) under the tongue every 5 (five) minutes as needed for chest pain. (Patient not taking: Reported on 09/25/2017)   No facility-administered encounter medications on file as of 09/25/2017.     PAST MEDICAL HISTORY:   Past Medical History:  Diagnosis Date  . Actinic keratosis   . Colonic polyp   . COLONIC POLYPS, HX OF   . CONTACT DERMATITIS DUE TO SOLVENTS   . DOE (dyspnea on exertion) 07-25-12   related to eating shrimp  . GERD (gastroesophageal reflux disease)   . HAND PAIN   . HERPES, GENITAL NOS   . Ingrowing toenail of left foot 07-25-12   is resolved now 07-25-12  . KNEE PAIN   . Neoplasm of uncertain behavior of skin   . OA (osteoarthritis) of knee   . ONYCHOMYCOSIS   . OSTEOARTHRITIS   . Preop exam for internal medicine   . SINUSITIS, ACUTE   . Well adult exam     PAST SURGICAL HISTORY:   Past Surgical History:  Procedure Laterality Date  . JOINT REPLACEMENT  9/13   L TKR  . KNEE ARTHROSCOPY     Left  . UMBILICAL HERNIA REPAIR  07-25-12   10 yrs ago    SOCIAL HISTORY:   Social History   Socioeconomic History  . Marital status: Married    Spouse name: Not on file  . Number of children: Not on file  . Years of education: Not on file  . Highest  education level: Not on file  Social Needs  . Financial resource strain: Not on file  . Food insecurity - worry: Not on file  . Food insecurity - inability: Not on file  . Transportation needs - medical: Not on file  . Transportation needs - non-medical: Not on file  Occupational History  . Not on file  Tobacco Use  . Smoking status: Former Smoker    Types: Cigars    Last attempt to quit: 08/30/2007    Years since quitting: 10.0  .  Smokeless tobacco: Never Used  . Tobacco comment: occ. cigar  Substance and Sexual Activity  . Alcohol use: Yes    Comment: occ. socially (1 time per year) - used to be more  . Drug use: Yes    Types: Marijuana    Comment: Marijuana socially   . Sexual activity: Yes    Partners: Female    Birth control/protection: None  Other Topics Concern  . Not on file  Social History Narrative  . Not on file    FAMILY HISTORY:   Family Status  Relation Name Status  . Mother  Alive  . Father  Deceased  . Sister  Alive  . Brother Mikki Santee Deceased  . Other  (Not Specified)  . Brother [employer]  . Daughter  Alive  . Neg Hx  (Not Specified)    ROS:  A complete 10 system review of systems was obtained and was unremarkable apart from what is mentioned above.  PHYSICAL EXAMINATION:    VITALS:   Vitals:   09/25/17 0957  BP: 130/68  Pulse: 86  SpO2: 94%  Weight: 234 lb (106.1 kg)  Height: 6' (1.829 m)   Last took xanax before bed last night (8:30pm).    GEN:  The patient appears stated age and is in NAD. HEENT:  Normocephalic, atraumatic.  The mucous membranes are moist. The superficial temporal arteries are without ropiness or tenderness. CV:  RRR Lungs:  CTAB Neck/HEME:  There are no carotid bruits bilaterally.  Neurological examination:  Orientation: The patient is alert and oriented x3. Fund of knowledge is appropriate.  Recent and remote memory are intact.  Attention and concentration are normal.    Able to name objects and repeat  phrases. Cranial nerves: There is good facial symmetry.  There is no blepharospasm noted.   Pupils are equal round and reactive to light bilaterally. Fundoscopic exam is attempted but the disc margins are not well visualized bilaterally.  Extraocular muscles are intact. The visual fields are full to confrontational testing. The speech is fluent and clear. He has pressured speech.  Soft palate rises symmetrically and there is no tongue deviation. Hearing is intact to conversational tone. Sensation: Sensation is intact to light and pinprick throughout (facial, trunk, extremities). Vibration is intact at the bilateral big toe but slightly decreased . There is no extinction with double simultaneous stimulation. There is no sensory dermatomal level identified. Motor: Strength is 5/5 in the bilateral upper and lower extremities.   Shoulder shrug is equal and symmetric.  There is no pronator drift. Deep tendon reflexes: Deep tendon reflexes are 2-/4 at the bilateral biceps, triceps, brachioradialis, 1/4 at the bilateral patella and absent at the bilateral achilles. Plantar responses are downgoing bilaterally.  Movement examination: Tone: There is normal tone in the bilateral upper extremities.  The tone in the lower extremities is normal.  Abnormal movements: no lip smacking.  No orobuccal lingual dyskinesia.  No resting tremor is noted.  No postural tremor.  he has no significant difficulty with archimedes spirals.  No intention tremor.   Coordination:  There is no decremation with RAM's, with any form of RAMS, including alternating supination and pronation of the forearm, hand opening and closing, finger taps, heel taps and toe taps. Gait and Station: The patient has no difficulty arising out of a deep-seated chair without the use of the hands. The patient's stride length is normal.  He has some trouble ambulating in a tandem fashion.  He is able to heel  toe walk.    ASSESSMENT/PLAN:  1.  History of  abnormal movements  -Long discussion with the patient today that I saw no evidence of tardive dyskinesia or tardive parkinsonism due to Reglan.  I saw no evidence of a movement disorder at all.  He had no orobuccolingual dyskinesia.  No evidence of tremor, either at rest or with intention.  I do think that stress is playing a role and I talked to him about finding a counselor and the role of exercise.  He states that his cardiologist talked to him about this as well but he lives far away from any resources.  He does have an appointment on Friday with his pcp and states that he will talk to him about that.  He will f/u here prn.  Much greater than 50% of this visit was spent in counseling and coordinating care.  Total face to face time:  45 min.  This did not include the 40 min of record review which was detailed above, which was non face to face time.   Cc:  Plotnikov, Evie Lacks, MD

## 2017-09-25 ENCOUNTER — Encounter: Payer: Self-pay | Admitting: Neurology

## 2017-09-25 ENCOUNTER — Ambulatory Visit: Payer: Medicare HMO | Admitting: Neurology

## 2017-09-25 VITALS — BP 130/68 | HR 86 | Ht 72.0 in | Wt 234.0 lb

## 2017-09-25 DIAGNOSIS — Z8669 Personal history of other diseases of the nervous system and sense organs: Secondary | ICD-10-CM

## 2017-09-25 DIAGNOSIS — F330 Major depressive disorder, recurrent, mild: Secondary | ICD-10-CM | POA: Diagnosis not present

## 2017-09-29 ENCOUNTER — Ambulatory Visit: Payer: Medicare HMO | Admitting: Internal Medicine

## 2017-09-29 ENCOUNTER — Encounter: Payer: Self-pay | Admitting: Internal Medicine

## 2017-09-29 ENCOUNTER — Other Ambulatory Visit (INDEPENDENT_AMBULATORY_CARE_PROVIDER_SITE_OTHER): Payer: Medicare HMO

## 2017-09-29 VITALS — BP 126/72 | HR 77 | Temp 98.0°F | Ht 72.0 in | Wt 237.0 lb

## 2017-09-29 DIAGNOSIS — N320 Bladder-neck obstruction: Secondary | ICD-10-CM | POA: Diagnosis not present

## 2017-09-29 DIAGNOSIS — K2100 Gastro-esophageal reflux disease with esophagitis, without bleeding: Secondary | ICD-10-CM

## 2017-09-29 DIAGNOSIS — F419 Anxiety disorder, unspecified: Secondary | ICD-10-CM | POA: Diagnosis not present

## 2017-09-29 DIAGNOSIS — R739 Hyperglycemia, unspecified: Secondary | ICD-10-CM

## 2017-09-29 DIAGNOSIS — R251 Tremor, unspecified: Secondary | ICD-10-CM | POA: Diagnosis not present

## 2017-09-29 LAB — BASIC METABOLIC PANEL
BUN: 25 mg/dL — AB (ref 6–23)
CHLORIDE: 102 meq/L (ref 96–112)
CO2: 28 mEq/L (ref 19–32)
CREATININE: 0.97 mg/dL (ref 0.40–1.50)
Calcium: 10 mg/dL (ref 8.4–10.5)
GFR: 82.75 mL/min (ref >60.00)
Glucose, Bld: 103 mg/dL — ABNORMAL HIGH (ref 70–99)
Potassium: 4.4 mEq/L (ref 3.5–5.1)
Sodium: 138 mEq/L (ref 135–145)

## 2017-09-29 LAB — TESTOSTERONE: Testosterone: 293.93 ng/dL — ABNORMAL LOW (ref 300.00–890.00)

## 2017-09-29 LAB — HEMOGLOBIN A1C: HEMOGLOBIN A1C: 5.6 % (ref 4.6–6.5)

## 2017-09-29 MED ORDER — ESCITALOPRAM OXALATE 5 MG PO TABS: 5.0000 mg | ORAL_TABLET | Freq: Every day | ORAL | 5 refills | Status: DC

## 2017-09-29 MED ORDER — PANTOPRAZOLE SODIUM 40 MG PO TBEC: 40.0000 mg | DELAYED_RELEASE_TABLET | Freq: Two times a day (BID) | ORAL | 3 refills | Status: DC

## 2017-09-29 MED ORDER — MELOXICAM 7.5 MG PO TABS: ORAL_TABLET | ORAL | 1 refills | Status: DC

## 2017-09-29 NOTE — Assessment & Plan Note (Signed)
On Protonix 

## 2017-09-29 NOTE — Assessment & Plan Note (Addendum)
Xanax - d/c Try Lexapro Testosterone level

## 2017-09-29 NOTE — Assessment & Plan Note (Signed)
Nocturia - Labs

## 2017-09-29 NOTE — Progress Notes (Signed)
Subjective:  Patient ID: [name], male    DOB: 1951/08/28  Age: 66 y.o. MRN: [id number]  CC: No chief complaint on file.   HPI TYGER WICHMAN presents for his tics and abn movements. Xanax did not help. Dr Doristine Devoid note was reviewed. C/o arthritis. Wife thinks he is depressed...  Outpatient Medications Prior to Visit  Medication Sig Dispense Refill  . ALPRAZolam (XANAX) 0.5 MG tablet Take 1 tablet (0.5 mg total) by mouth 2 (two) times daily as needed for anxiety. 60 tablet 1  . Alum Hydroxide-Mag Carbonate (GAVISCON PO) Take 1 tablet by mouth daily as needed (indigestion).    . Ascorbic Acid (VITAMIN C) 1000 MG tablet Take 1,000 mg by mouth daily.    [name] aspirin 81 MG tablet Take 81 mg by mouth at bedtime.     . cholecalciferol (VITAMIN D) 1000 units tablet Take 1,000 Units by mouth daily.    . Clotrimazole (LOTRIMIN AF JOCK ITCH EX) Apply 1 application topically daily as needed (sweat).    . Cyanocobalamin (VITAMIN B-12) 2500 MCG SUBL Place 5,000 mcg under the tongue daily.    . fluticasone (FLONASE) 50 MCG/ACT nasal spray USE 1 SPRAY(S) IN EACH NOSTRIL ONCE DAILY (Patient taking differently: USE 1 SPRAY(S) IN EACH NOSTRIL ONCE DAILY AS NEEDED FOR ALLERGIES) 16 g 1  . Multiple Vitamin (MULTIVITAMIN WITH MINERALS) TABS tablet Take 1 tablet by mouth daily.    . nitroGLYCERIN (NITROSTAT) 0.4 MG SL tablet Place 1 tablet (0.4 mg total) under the tongue every 5 (five) minutes as needed for chest pain. 11 tablet 6  . Nutritional Supplements (JUICE PLUS FIBRE PO) Take 3 tablets by mouth 2 (two) times daily.     . ondansetron (ZOFRAN) 4 MG tablet 1-2 tablets every 4-6 hours as needed for nausea 30 tablet 3  . pantoprazole (PROTONIX) 40 MG tablet Take 40 mg by mouth 2 (two) times daily.     . Red Yeast Rice 600 MG TABS Take 1,200 mg by mouth daily.    . traMADol (ULTRAM) 50 MG tablet TAKE ONE TO TWO TABLETS BY MOUTH TWICE DAILY AS NEEDED FOR PAIN 120 tablet 3  . Turmeric 500 MG TABS Take 500 mg  by mouth 2 (two) times daily.    . valACYclovir (VALTREX) 500 MG tablet TAKE 1 TABLET BY MOUTH ONCE DAILY 90 tablet 1  . meloxicam (MOBIC) 7.5 MG tablet TAKE 1 TO 2 TABLETS BY MOUTH ONCE DAILY (Patient taking differently: Take 15 mg by mouth once daily) 180 tablet 0   No facility-administered medications prior to visit.     ROS Review of Systems  Constitutional: Negative for appetite change, fatigue and unexpected weight change.  HENT: Negative for congestion, nosebleeds, sneezing, sore throat and trouble swallowing.   Eyes: Negative for itching and visual disturbance.  Respiratory: Negative for cough.   Cardiovascular: Negative for chest pain, palpitations and leg swelling.  Gastrointestinal: Negative for abdominal distention, blood in stool, diarrhea and nausea.  Genitourinary: Negative for frequency and hematuria.  Musculoskeletal: Negative for back pain, gait problem, joint swelling and neck pain.  Skin: Negative for rash.  Neurological: Positive for tremors. Negative for dizziness, speech difficulty and weakness.  Psychiatric/Behavioral: Positive for decreased concentration. Negative for agitation, dysphoric mood, sleep disturbance and suicidal ideas. The patient is nervous/anxious.     Objective:  BP 126/72 (BP Location: Left Arm, Patient Position: Sitting, Cuff Size: Large)   Pulse 77   Temp 98 F (36.7 C) (Oral)  Ht 6' (1.829 m)   Wt 237 lb (107.5 kg)   SpO2 99%   BMI 32.14 kg/m   BP Readings from Last 3 Encounters:  09/29/17 126/72  09/25/17 130/68  09/15/17 136/78    Wt Readings from Last 3 Encounters:  09/29/17 237 lb (107.5 kg)  09/25/17 234 lb (106.1 kg)  09/15/17 229 lb (103.9 kg)    Physical Exam  Constitutional: He is oriented to person, place, and time. He appears well-developed. No distress.  NAD  HENT:  Mouth/Throat: Oropharynx is clear and moist.  Eyes: Conjunctivae are normal. Pupils are equal, round, and reactive to light.  Neck: Normal range  of motion. No JVD present. No thyromegaly present.  Cardiovascular: Normal rate, regular rhythm, normal heart sounds and intact distal pulses. Exam reveals no gallop and no friction rub.  No murmur heard. Pulmonary/Chest: Effort normal and breath sounds normal. No respiratory distress. He has no wheezes. He has no rales. He exhibits no tenderness.  Abdominal: Soft. Bowel sounds are normal. He exhibits no distension and no mass. There is no tenderness. There is no rebound and no guarding.  Musculoskeletal: Normal range of motion. He exhibits no edema or tenderness.  Lymphadenopathy:    He has no cervical adenopathy.  Neurological: He is alert and oriented to person, place, and time. He has normal reflexes. No cranial nerve deficit. He exhibits normal muscle tone. He displays a negative Romberg sign. Coordination and gait normal.  Skin: Skin is warm and dry. No rash noted.  Psychiatric: He has a normal mood and affect. His behavior is normal. Judgment and thought content normal.  Obese  Lab Results  Component Value Date   WBC 7.3 08/21/2017   HGB 14.0 08/21/2017   HCT 41.4 08/21/2017   PLT 214 08/21/2017   GLUCOSE 114 (H) 08/21/2017   CHOL 225 (H) 07/12/2017   TRIG 159.0 (H) 07/12/2017   HDL 38.80 (L) 07/12/2017   LDLDIRECT 102.0 06/10/2015   LDLCALC 154 (H) 07/12/2017   ALT 29 07/12/2017   AST 18 07/12/2017   NA 140 08/21/2017   K 4.0 08/21/2017   CL 108 08/21/2017   CREATININE 0.97 08/21/2017   BUN 15 08/21/2017   CO2 27 08/21/2017   TSH 1.42 07/12/2017   PSA 0.84 07/12/2017   INR 1.0 08/29/2017   HGBA1C 5.4 06/16/2016    No results found.  Assessment & Plan:   There are no diagnoses linked to this encounter. I have changed Tito Ausmus. Mak's meloxicam. I am also having him maintain his aspirin, pantoprazole, traMADol, fluticasone, valACYclovir, ondansetron, Nutritional Supplements (JUICE PLUS FIBRE PO), nitroGLYCERIN, vitamin C, multivitamin with minerals, Vitamin B-12,  Red Yeast Rice, Turmeric, cholecalciferol, Alum Hydroxide-Mag Carbonate (GAVISCON PO), Clotrimazole (LOTRIMIN AF JOCK ITCH EX), and ALPRAZolam.  Meds ordered this encounter  Medications  . meloxicam (MOBIC) 7.5 MG tablet    Sig: Take 15 mg by mouth once daily    Dispense:  180 tablet    Refill:  1    90 day     Follow-up: No Follow-up on file.  Walker Kehr, MD

## 2017-09-29 NOTE — Assessment & Plan Note (Signed)
Neurol ref - Dr Tat:   "History of abnormal movements             -Long discussion with the patient today that I saw no evidence of tardive dyskinesia or tardive parkinsonism due to Reglan.  I saw no evidence of a movement disorder at all.  He had no orobuccolingual dyskinesia.  No evidence of tremor, either at rest or with intention.  I do think that stress is playing a role and I talked to him about finding a counselor and the role of exercise.  He states that his cardiologist talked to him about this as well but he lives far away from any resources.  He does have an appointment on Friday with his pcp and states that he will talk to him about that.  He will f/u here prn.  Much greater than 50% of this visit was spent in counseling and coordinating care.  Total face to face time:  45 min.  This did not include the 40 min of record review which was detailed above, which was non face to face time."

## 2017-10-02 ENCOUNTER — Other Ambulatory Visit: Payer: Self-pay | Admitting: Internal Medicine

## 2017-10-17 ENCOUNTER — Ambulatory Visit: Payer: Medicare HMO | Admitting: Internal Medicine

## 2017-10-24 ENCOUNTER — Other Ambulatory Visit: Payer: Self-pay | Admitting: Internal Medicine

## 2017-11-24 ENCOUNTER — Ambulatory Visit: Payer: Medicare HMO | Admitting: Internal Medicine

## 2017-12-22 NOTE — Progress Notes (Addendum)
Subjective:   [name] is a 67 y.o. male who presents for Medicare Annual/Subsequent preventive examination.  Review of Systems:  No ROS.  Medicare Wellness Visit. Additional risk factors are reflected in the social history.  Cardiac Risk Factors include: advanced age (>83men, >49 women);dyslipidemia;male gender Sleep patterns: feels rested on waking, gets up 2-3 times nightly to void and sleeps 7 hours nightly.   Home Safety/Smoke Alarms: Feels safe in home. Smoke alarms in place.  Living environment; residence and Firearm Safety: 2-story house, no firearms. Lives with wife, no needs for DME, good support system Seat Belt Safety/Bike Helmet: Wears seat belt.   PSA-  Lab Results  Component Value Date   PSA 0.84 07/12/2017   PSA 0.72 06/16/2016   PSA 0.67 06/10/2015       Objective:    Vitals: There were no vitals taken for this visit.  There is no height or weight on file to calculate BMI.  Advanced Directives 12/25/2017 08/31/2017 08/21/2017 09/26/2016 11/12/2015 07/31/2012 07/25/2012  Does Patient Have a Medical Advance Directive? No No No No No Patient does not have advance directive Patient does not have advance directive  Would patient like information on creating a medical advance directive? Yes (ED - Information included in AVS) No - Patient declined No - Patient declined No - patient declined information - - -  Pre-existing out of facility DNR order (yellow form or pink MOST form) - - - - - No No    Tobacco Social History   Tobacco Use  Smoking Status Former Smoker  . Types: Cigars  . Last attempt to quit: 08/30/2007  . Years since quitting: 10.3  Smokeless Tobacco Never Used  Tobacco Comment   occ. cigar     Counseling given: Not Answered Comment: occ. cigar   Past Medical History:  Diagnosis Date  . Actinic keratosis   . Colonic polyp   . COLONIC POLYPS, HX OF   . CONTACT DERMATITIS DUE TO SOLVENTS   . DOE (dyspnea on exertion) 07-25-12   related  to eating shrimp  . GERD (gastroesophageal reflux disease)   . HAND PAIN   . HERPES, GENITAL NOS   . Ingrowing toenail of left foot 07-25-12   is resolved now 07-25-12  . KNEE PAIN   . Neoplasm of uncertain behavior of skin   . OA (osteoarthritis) of knee   . ONYCHOMYCOSIS   . OSTEOARTHRITIS   . Preop exam for internal medicine   . SINUSITIS, ACUTE   . Well adult exam    Past Surgical History:  Procedure Laterality Date  . JOINT REPLACEMENT  9/13   L TKR  . KNEE ARTHROSCOPY     Left  . LEFT HEART CATH AND CORONARY ANGIOGRAPHY N/A 08/31/2017   Procedure: LEFT HEART CATH AND CORONARY ANGIOGRAPHY;  Surgeon: Nelva Bush, MD;  Location: [facility];  Service: Cardiovascular;  Laterality: N/A;  . TOTAL KNEE ARTHROPLASTY  07/31/2012   Procedure: TOTAL KNEE ARTHROPLASTY;  Surgeon: Sydnee Cabal, MD;  Location: [facility];  Service: Orthopedics;  Laterality: Left;  . UMBILICAL HERNIA REPAIR  07-25-12   10 yrs ago   Family History  Problem Relation Age of Onset  . Diabetes Mother 49  . Heart disease Father 52       CAD, MI, CHF  . Lung cancer Brother        smoker  . Coronary artery disease Other   . Colon polyps Brother   . Colon cancer  Neg Hx   . Stomach cancer Neg Hx    Social History   Socioeconomic History  . Marital status: Married    Spouse name: Not on file  . Number of children: Not on file  . Years of education: Not on file  . Highest education level: Not on file  Social Needs  . Financial resource strain: Somewhat hard  . Food insecurity - worry: Sometimes true  . Food insecurity - inability: Sometimes true  . Transportation needs - medical: No  . Transportation needs - non-medical: No  Occupational History  . Not on file  Tobacco Use  . Smoking status: Former Smoker    Types: Cigars    Last attempt to quit: 08/30/2007    Years since quitting: 10.3  . Smokeless tobacco: Never Used  . Tobacco comment: occ. cigar  Substance and Sexual Activity  .  Alcohol use: Yes    Comment: occ. socially (1 time per year) - used to be more  . Drug use: Yes    Types: Marijuana    Comment: Marijuana socially   . Sexual activity: Yes    Partners: Female    Birth control/protection: None  Other Topics Concern  . Not on file  Social History Narrative  . Not on file    Outpatient Encounter Medications as of 12/25/2017  Medication Sig  . Alum Hydroxide-Mag Carbonate (GAVISCON PO) Take 1 tablet by mouth daily as needed (indigestion).  . Ascorbic Acid (VITAMIN C) 1000 MG tablet Take 1,000 mg by mouth daily.  [name] aspirin 81 MG tablet Take 81 mg by mouth at bedtime.   . cholecalciferol (VITAMIN D) 1000 units tablet Take 1,000 Units by mouth daily.  . Clotrimazole (LOTRIMIN AF JOCK ITCH EX) Apply 1 application topically daily as needed (sweat).  . Cyanocobalamin (VITAMIN B-12) 2500 MCG SUBL Place 5,000 mcg under the tongue daily.  [name] escitalopram (LEXAPRO) 10 MG tablet Take 1 tablet (10 mg total) by mouth daily.  . fluticasone (FLONASE) 50 MCG/ACT nasal spray USE 1 SPRAY(S) IN EACH NOSTRIL ONCE DAILY  . meloxicam (MOBIC) 7.5 MG tablet TAKE 1 TO 2 TABLETS BY MOUTH ONCE DAILY  . Multiple Vitamin (MULTIVITAMIN WITH MINERALS) TABS tablet Take 1 tablet by mouth daily.  . nitroGLYCERIN (NITROSTAT) 0.4 MG SL tablet Place 1 tablet (0.4 mg total) under the tongue every 5 (five) minutes as needed for chest pain.  . Nutritional Supplements (JUICE PLUS FIBRE PO) Take 3 tablets by mouth 2 (two) times daily.   . ondansetron (ZOFRAN) 4 MG tablet 1-2 tablets every 4-6 hours as needed for nausea  . pantoprazole (PROTONIX) 40 MG tablet Take 1 tablet (40 mg total) 2 (two) times daily by mouth.  . Red Yeast Rice 600 MG TABS Take 1,200 mg by mouth daily.  . traMADol (ULTRAM) 50 MG tablet TAKE 1 TO 2 TABLETS BY MOUTH TWICE DAILY AS NEEDED FOR PAIN  . Turmeric 500 MG TABS Take 500 mg by mouth 2 (two) times daily.  . valACYclovir (VALTREX) 500 MG tablet TAKE 1 TABLET BY MOUTH ONCE  DAILY  . [DISCONTINUED] escitalopram (LEXAPRO) 5 MG tablet Take 1 tablet (5 mg total) daily by mouth. At bedtime.  . [DISCONTINUED] meloxicam (MOBIC) 7.5 MG tablet Take 15 mg by mouth once daily   No facility-administered encounter medications on file as of 12/25/2017.     Activities of Daily Living In your present state of health, do you have any difficulty performing the following activities: 12/25/2017 08/31/2017  Hearing? N N  Vision? N N  Difficulty concentrating or making decisions? N N  Walking or climbing stairs? N N  Dressing or bathing? N N  Doing errands, shopping? N -  Preparing Food and eating ? N -  Using the Toilet? N -  In the past six months, have you accidently leaked urine? N -  Do you have problems with loss of bowel control? N -  Managing your Medications? N -  Managing your Finances? N -  Housekeeping or managing your Housekeeping? N -  Some recent data might be hidden    Patient Care Team: Plotnikov, Evie Lacks, MD as PCP - General Josue Hector, MD (Cardiology) Sydnee Cabal, MD (Orthopedic Surgery) Irene Shipper, MD as Consulting Physician (Gastroenterology) Revankar, Reita Cliche, MD as Consulting Physician (Cardiology) Tat, Eustace Quail, DO as Consulting Physician (Neurology)   Assessment:   This is a routine wellness examination for [name]. Physical assessment deferred to PCP.   Exercise Activities and Dietary recommendations Current Exercise Habits: Structured exercise class, Type of exercise: walking;strength training/weights;calisthenics;stretching, Time (Minutes): 50, Frequency (Times/Week): 4, Weekly Exercise (Minutes/Week): 200, Intensity: Mild, Exercise limited by: None identified  Diet (meal preparation, eat out, water intake, caffeinated beverages, dairy products, fruits and vegetables): in general, a "healthy" diet  , well balanced, eats a variety of fruits and vegetables daily, limits salt, fat/cholesterol, sugar, caffeine, drinks 6-8 glasses of  water daily.  Goals    . Patient Stated     Continue to exercise, eat healthy, enjoy life and family.       Fall Risk Fall Risk  12/25/2017 09/25/2017  Falls in the past year? Yes Yes  Number falls in past yr: - 1  Injury with Fall? No No  Follow up - Falls evaluation completed    Depression Screen PHQ 2/9 Scores 12/25/2017 09/29/2017 07/18/2017  PHQ - 2 Score 1 5 -  PHQ- 9 Score 1 20 -  Exception Documentation - - Other- indicate reason in comment box    Cognitive Function       Ad8 score reviewed for issues:  Issues making decisions: no  Less interest in hobbies / activities: no  Repeats questions, stories (family complaining): no  Trouble using ordinary gadgets (microwave, computer, phone):no  Forgets the month or year: no  Mismanaging finances: no  Remembering appts: no  Daily problems with thinking and/or memory: no Ad8 score is= 0  Immunization History  Administered Date(s) Administered  . Influenza Split 07/20/2012  . Influenza Whole 08/15/2011  . Influenza, High Dose Seasonal PF 07/27/2016  . Influenza,inj,Quad PF,6+ Mos 08/12/2013, 07/18/2017  . Influenza-Unspecified 07/15/2014, 10/15/2015, 08/02/2016  . Td 01/28/2010  . Zoster 12/23/2013    Screening Tests Health Maintenance  Topic Date Due  . HIV Screening  07[birth year]03/1968  . TETANUS/TDAP  01/29/2020  . COLONOSCOPY  04/14/2025  . INFLUENZA VACCINE  Completed  . Hepatitis C Screening  Completed      Plan:    Continue doing brain stimulating activities (puzzles, reading, adult coloring books, staying active) to keep memory sharp.   Continue to eat heart healthy diet (full of fruits, vegetables, whole grains, lean protein, water--limit salt, fat, and sugar intake) and increase physical activity as tolerated.  I have personally reviewed and noted the following in the patient's chart:   . Medical and social history . Use of alcohol, tobacco or illicit drugs  . Current medications and  supplements . Functional ability and status . Nutritional status . Physical  activity . Advanced directives . List of other physicians . Vitals . Screenings to include cognitive, depression, and falls . Referrals and appointments  In addition, I have reviewed and discussed with patient certain preventive protocols, quality metrics, and best practice recommendations. A written personalized care plan for preventive services as well as general preventive health recommendations were provided to patient.     Michiel Cowboy, RN  12/25/2017  Medical screening examination/treatment/procedure(s) were performed by non-physician practitioner and as supervising physician I was immediately available for consultation/collaboration. I agree with above. Lew Dawes, MD

## 2017-12-25 ENCOUNTER — Ambulatory Visit: Payer: Medicare HMO | Admitting: *Deleted

## 2017-12-25 ENCOUNTER — Encounter: Payer: Self-pay | Admitting: Internal Medicine

## 2017-12-25 ENCOUNTER — Ambulatory Visit (INDEPENDENT_AMBULATORY_CARE_PROVIDER_SITE_OTHER): Payer: Medicare HMO | Admitting: Internal Medicine

## 2017-12-25 VITALS — BP 126/82 | HR 83 | Temp 98.4°F | Ht 72.0 in | Wt 227.0 lb

## 2017-12-25 DIAGNOSIS — F419 Anxiety disorder, unspecified: Secondary | ICD-10-CM

## 2017-12-25 DIAGNOSIS — I2510 Atherosclerotic heart disease of native coronary artery without angina pectoris: Secondary | ICD-10-CM

## 2017-12-25 DIAGNOSIS — Z0001 Encounter for general adult medical examination with abnormal findings: Secondary | ICD-10-CM | POA: Diagnosis not present

## 2017-12-25 DIAGNOSIS — Z0000 Encounter for general adult medical examination without abnormal findings: Secondary | ICD-10-CM

## 2017-12-25 DIAGNOSIS — K2100 Gastro-esophageal reflux disease with esophagitis, without bleeding: Secondary | ICD-10-CM

## 2017-12-25 DIAGNOSIS — M5440 Lumbago with sciatica, unspecified side: Secondary | ICD-10-CM

## 2017-12-25 DIAGNOSIS — E785 Hyperlipidemia, unspecified: Secondary | ICD-10-CM | POA: Diagnosis not present

## 2017-12-25 DIAGNOSIS — M150 Primary generalized (osteo)arthritis: Secondary | ICD-10-CM | POA: Diagnosis not present

## 2017-12-25 DIAGNOSIS — R7309 Other abnormal glucose: Secondary | ICD-10-CM

## 2017-12-25 DIAGNOSIS — N320 Bladder-neck obstruction: Secondary | ICD-10-CM | POA: Diagnosis not present

## 2017-12-25 DIAGNOSIS — M159 Polyosteoarthritis, unspecified: Secondary | ICD-10-CM

## 2017-12-25 DIAGNOSIS — K2270 Barrett's esophagus without dysplasia: Secondary | ICD-10-CM

## 2017-12-25 MED ORDER — ESCITALOPRAM OXALATE 10 MG PO TABS: 10.0000 mg | ORAL_TABLET | Freq: Every day | ORAL | 3 refills | Status: DC

## 2017-12-25 NOTE — Assessment & Plan Note (Signed)
Pantoprazole.

## 2017-12-25 NOTE — Assessment & Plan Note (Signed)
Tramadol prn 

## 2017-12-25 NOTE — Progress Notes (Signed)
Subjective:  Patient ID: [name], male    DOB: [birth year]  Age: 67 y.o. MRN: [id number]  CC: No chief complaint on file.   HPI [name] presents for OA, anxiety w/panic attacks and shaking, GERD f/u Well exam  Outpatient Medications Prior to Visit  Medication Sig Dispense Refill  . Alum Hydroxide-Mag Carbonate (GAVISCON PO) Take 1 tablet by mouth daily as needed (indigestion).    . Ascorbic Acid (VITAMIN C) 1000 MG tablet Take 1,000 mg by mouth daily.    [name] aspirin 81 MG tablet Take 81 mg by mouth at bedtime.     . cholecalciferol (VITAMIN D) 1000 units tablet Take 1,000 Units by mouth daily.    . Clotrimazole (LOTRIMIN AF JOCK ITCH EX) Apply 1 application topically daily as needed (sweat).    . Cyanocobalamin (VITAMIN B-12) 2500 MCG SUBL Place 5,000 mcg under the tongue daily.    [name] escitalopram (LEXAPRO) 5 MG tablet Take 1 tablet (5 mg total) daily by mouth. At bedtime. 30 tablet 5  . fluticasone (FLONASE) 50 MCG/ACT nasal spray USE 1 SPRAY(S) IN EACH NOSTRIL ONCE DAILY 48 g 3  . meloxicam (MOBIC) 7.5 MG tablet Take 15 mg by mouth once daily 180 tablet 1  . meloxicam (MOBIC) 7.5 MG tablet TAKE 1 TO 2 TABLETS BY MOUTH ONCE DAILY 180 tablet 1  . Multiple Vitamin (MULTIVITAMIN WITH MINERALS) TABS tablet Take 1 tablet by mouth daily.    . Nutritional Supplements (JUICE PLUS FIBRE PO) Take 3 tablets by mouth 2 (two) times daily.     . ondansetron (ZOFRAN) 4 MG tablet 1-2 tablets every 4-6 hours as needed for nausea 30 tablet 3  . pantoprazole (PROTONIX) 40 MG tablet Take 1 tablet (40 mg total) 2 (two) times daily by mouth. 180 tablet 3  . Red Yeast Rice 600 MG TABS Take 1,200 mg by mouth daily.    . traMADol (ULTRAM) 50 MG tablet TAKE 1 TO 2 TABLETS BY MOUTH TWICE DAILY AS NEEDED FOR PAIN 120 tablet 3  . Turmeric 500 MG TABS Take 500 mg by mouth 2 (two) times daily.    . valACYclovir (VALTREX) 500 MG tablet TAKE 1 TABLET BY MOUTH ONCE DAILY 90 tablet 1  . nitroGLYCERIN  (NITROSTAT) 0.4 MG SL tablet Place 1 tablet (0.4 mg total) under the tongue every 5 (five) minutes as needed for chest pain. 11 tablet 6   No facility-administered medications prior to visit.     ROS Review of Systems  Constitutional: Negative for appetite change, fatigue and unexpected weight change.  HENT: Negative for congestion, nosebleeds, sneezing, sore throat and trouble swallowing.   Eyes: Negative for itching and visual disturbance.  Respiratory: Negative for cough.   Cardiovascular: Negative for chest pain, palpitations and leg swelling.  Gastrointestinal: Negative for abdominal distention, blood in stool, diarrhea and nausea.  Genitourinary: Negative for frequency and hematuria.  Musculoskeletal: Positive for arthralgias, back pain and gait problem. Negative for joint swelling and neck pain.  Skin: Negative for rash.  Neurological: Negative for dizziness, tremors, speech difficulty and weakness.  Psychiatric/Behavioral: Positive for dysphoric mood. Negative for agitation and sleep disturbance. The patient is nervous/anxious.     Objective:  BP 126/82 (BP Location: Left Arm, Patient Position: Sitting, Cuff Size: Large)   Pulse 83   Temp 98.4 F (36.9 C) (Oral)   Ht 6' (1.829 m)   Wt 227 lb (103 kg)   SpO2 99%   BMI 30.79 kg/m  BP Readings from Last 3 Encounters:  12/25/17 126/82  09/29/17 126/72  09/25/17 130/68    Wt Readings from Last 3 Encounters:  12/25/17 227 lb (103 kg)  09/29/17 237 lb (107.5 kg)  09/25/17 234 lb (106.1 kg)    Physical Exam  Constitutional: He is oriented to person, place, and time. He appears well-developed. No distress.  NAD  HENT:  Mouth/Throat: Oropharynx is clear and moist.  Eyes: Conjunctivae are normal. Pupils are equal, round, and reactive to light.  Neck: Normal range of motion. No JVD present. No thyromegaly present.  Cardiovascular: Normal rate, regular rhythm, normal heart sounds and intact distal pulses. Exam reveals no  gallop and no friction rub.  No murmur heard. Pulmonary/Chest: Effort normal and breath sounds normal. No respiratory distress. He has no wheezes. He has no rales. He exhibits no tenderness.  Abdominal: Soft. Bowel sounds are normal. He exhibits no distension and no mass. There is no tenderness. There is no rebound and no guarding.  Musculoskeletal: Normal range of motion. He exhibits tenderness. He exhibits no edema.  Lymphadenopathy:    He has no cervical adenopathy.  Neurological: He is alert and oriented to person, place, and time. He has normal reflexes. No cranial nerve deficit. He exhibits normal muscle tone. He displays a negative Romberg sign. Coordination and gait normal.  Skin: Skin is warm and dry. No rash noted.  Psychiatric: He has a normal mood and affect. His behavior is normal. Judgment and thought content normal.  LS, knees - tender  Lab Results  Component Value Date   WBC 7.3 08/21/2017   HGB 14.0 08/21/2017   HCT 41.4 08/21/2017   PLT 214 08/21/2017   GLUCOSE 103 (H) 09/29/2017   CHOL 225 (H) 07/12/2017   TRIG 159.0 (H) 07/12/2017   HDL 38.80 (L) 07/12/2017   LDLDIRECT 102.0 06/10/2015   LDLCALC 154 (H) 07/12/2017   ALT 29 07/12/2017   AST 18 07/12/2017   NA 138 09/29/2017   K 4.4 09/29/2017   CL 102 09/29/2017   CREATININE 0.97 09/29/2017   BUN 25 (H) 09/29/2017   CO2 28 09/29/2017   TSH 1.42 07/12/2017   PSA 0.84 07/12/2017   INR 1.0 08/29/2017   HGBA1C 5.6 09/29/2017    No results found.  Assessment & Plan:   There are no diagnoses linked to this encounter. I am having [name]. [name] maintain his aspirin, valACYclovir, ondansetron, Nutritional Supplements (JUICE PLUS FIBRE PO), nitroGLYCERIN, vitamin C, multivitamin with minerals, Vitamin B-12, Red Yeast Rice, Turmeric, cholecalciferol, Alum Hydroxide-Mag Carbonate (GAVISCON PO), Clotrimazole (LOTRIMIN AF JOCK ITCH EX), meloxicam, pantoprazole, escitalopram, traMADol, meloxicam, and  fluticasone.  No orders of the defined types were placed in this encounter.    Follow-up: No Follow-up on file.  Walker Kehr, MD

## 2017-12-25 NOTE — Assessment & Plan Note (Signed)
Chronic  -- Protonix 5/18 We discussed his GERD/Barrett's treated at [facility] (Dr Ferdinand Lango). Reglan was d/c'd due to CP. He was dx'd w/pancreatitis and started on Zenpep He had an abd CT, [place], EGD and colonoscopy. He would like to have a 2nd opinion here - will refer

## 2017-12-25 NOTE — Patient Instructions (Addendum)
Continue doing brain stimulating activities (puzzles, reading, adult coloring books, staying active) to keep memory sharp.   Continue to eat heart healthy diet (full of fruits, vegetables, whole grains, lean protein, water--limit salt, fat, and sugar intake) and increase physical activity as tolerated.   [name] , Thank you for taking time to come for your Medicare Wellness Visit. I appreciate your ongoing commitment to your health goals. Please review the following plan we discussed and let me know if I can assist you in the future.   These are the goals we discussed: Goals    . Patient Stated     Continue to exercise, eat healthy, enjoy life and family.       This is a list of the screening recommended for you and due dates:  Health Maintenance  Topic Date Due  . HIV Screening  07/03/1968  . Tetanus Vaccine  01/29/2020  . Colon Cancer Screening  04/14/2025  . Flu Shot  Completed  .  Hepatitis C: One time screening is recommended by Center for Disease Control  (CDC) for  adults born from [birth year] through 1963.   Completed      Knee Exercises Ask your health care provider which exercises are safe for you. Do exercises exactly as told by your health care provider and adjust them as directed. It is normal to feel mild stretching, pulling, tightness, or discomfort as you do these exercises, but you should stop right away if you feel sudden pain or your pain gets worse.Do not begin these exercises until told by your health care provider. STRETCHING AND RANGE OF MOTION EXERCISES These exercises warm up your muscles and joints and improve the movement and flexibility of your knee. These exercises also help to relieve pain, numbness, and tingling. Exercise A: Knee Extension, Prone 1. Lie on your abdomen on a bed. 2. Place your left / right knee just beyond the edge of the surface so your knee is not on the bed. You can put a towel under your left / right thigh just above your knee for  comfort. 3. Relax your leg muscles and allow gravity to straighten your knee. You should feel a stretch behind your left / right knee. 4. Hold this position for __________ seconds. 5. Scoot up so your knee is supported between repetitions. Repeat __________ times. Complete this stretch __________ times a day. Exercise B: Knee Flexion, Active  1. Lie on your back with both knees straight. If this causes back discomfort, bend your left / right knee so your foot is flat on the floor. 2. Slowly slide your left / right heel back toward your buttocks until you feel a gentle stretch in the front of your knee or thigh. 3. Hold this position for __________ seconds. 4. Slowly slide your left / right heel back to the starting position. Repeat __________ times. Complete this exercise __________ times a day. Exercise C: Quadriceps, Prone  1. Lie on your abdomen on a firm surface, such as a bed or padded floor. 2. Bend your left / right knee and hold your ankle. If you cannot reach your ankle or pant leg, loop a belt around your foot and grab the belt instead. 3. Gently pull your heel toward your buttocks. Your knee should not slide out to the side. You should feel a stretch in the front of your thigh and knee. 4. Hold this position for __________ seconds. Repeat __________ times. Complete this stretch __________ times a day. Exercise D: Hamstring,  Supine 1. Lie on your back. 2. Loop a belt or towel over the ball of your left / right foot. The ball of your foot is on the walking surface, right under your toes. 3. Straighten your left / right knee and slowly pull on the belt to raise your leg until you feel a gentle stretch behind your knee. ? Do not let your left / right knee bend while you do this. ? Keep your other leg flat on the floor. 4. Hold this position for __________ seconds. Repeat __________ times. Complete this stretch __________ times a day. STRENGTHENING EXERCISES These exercises build  strength and endurance in your knee. Endurance is the ability to use your muscles for a long time, even after they get tired. Exercise E: Quadriceps, Isometric  1. Lie on your back with your left / right leg extended and your other knee bent. Put a rolled towel or small pillow under your knee if told by your health care provider. 2. Slowly tense the muscles in the front of your left / right thigh. You should see your kneecap slide up toward your hip or see increased dimpling just above the knee. This motion will push the back of the knee toward the floor. 3. For __________ seconds, keep the muscle as tight as you can without increasing your pain. 4. Relax the muscles slowly and completely. Repeat __________ times. Complete this exercise __________ times a day. Exercise F: Straight Leg Raises - Quadriceps 1. Lie on your back with your left / right leg extended and your other knee bent. 2. Tense the muscles in the front of your left / right thigh. You should see your kneecap slide up or see increased dimpling just above the knee. Your thigh may even shake a bit. 3. Keep these muscles tight as you raise your leg 4-6 inches (10-15 cm) off the floor. Do not let your knee bend. 4. Hold this position for __________ seconds. 5. Keep these muscles tense as you lower your leg. 6. Relax your muscles slowly and completely after each repetition. Repeat __________ times. Complete this exercise __________ times a day. Exercise G: Hamstring, Isometric 1. Lie on your back on a firm surface. 2. Bend your left / right knee approximately __________ degrees. 3. Dig your left / right heel into the surface as if you are trying to pull it toward your buttocks. Tighten the muscles in the back of your thighs to dig as hard as you can without increasing any pain. 4. Hold this position for __________ seconds. 5. Release the tension gradually and allow your muscles to relax completely for __________ seconds after each  repetition. Repeat __________ times. Complete this exercise __________ times a day. Exercise H: Hamstring Curls  If told by your health care provider, do this exercise while wearing ankle weights. Begin with __________ weights. Then increase the weight by 1 lb (0.5 kg) increments. Do not wear ankle weights that are more than __________. 1. Lie on your abdomen with your legs straight. 2. Bend your left / right knee as far as you can without feeling pain. Keep your hips flat against the floor. 3. Hold this position for __________ seconds. 4. Slowly lower your leg to the starting position.  Repeat __________ times. Complete this exercise __________ times a day. Exercise I: Squats (Quadriceps) 1. Stand in front of a table, with your feet and knees pointing straight ahead. You may rest your hands on the table for balance but not for support. 2.  Slowly bend your knees and lower your hips like you are going to sit in a chair. ? Keep your weight over your heels, not over your toes. ? Keep your lower legs upright so they are parallel with the table legs. ? Do not let your hips go lower than your knees. ? Do not bend lower than told by your health care provider. ? If your knee pain increases, do not bend as low. 3. Hold the squat position for __________ seconds. 4. Slowly push with your legs to return to standing. Do not use your hands to pull yourself to standing. Repeat __________ times. Complete this exercise __________ times a day. Exercise J: Wall Slides (Quadriceps)  1. Lean your back against a smooth wall or door while you walk your feet out 18-24 inches (46-61 cm) from it. 2. Place your feet hip-width apart. 3. Slowly slide down the wall or door until your knees bend __________ degrees. Keep your knees over your heels, not over your toes. Keep your knees in line with your hips. 4. Hold for __________ seconds. Repeat __________ times. Complete this exercise __________ times a day. Exercise  K: Straight Leg Raises - Hip Abductors 1. Lie on your side with your left / right leg in the top position. Lie so your head, shoulder, knee, and hip line up. You may bend your bottom knee to help you keep your balance. 2. Roll your hips slightly forward so your hips are stacked directly over each other and your left / right knee is facing forward. 3. Leading with your heel, lift your top leg 4-6 inches (10-15 cm). You should feel the muscles in your outer hip lifting. ? Do not let your foot drift forward. ? Do not let your knee roll toward the ceiling. 4. Hold this position for __________ seconds. 5. Slowly return your leg to the starting position. 6. Let your muscles relax completely after each repetition. Repeat __________ times. Complete this exercise __________ times a day. Exercise L: Straight Leg Raises - Hip Extensors 1. Lie on your abdomen on a firm surface. You can put a pillow under your hips if that is more comfortable. 2. Tense the muscles in your buttocks and lift your left / right leg about 4-6 inches (10-15 cm). Keep your knee straight as you lift your leg. 3. Hold this position for __________ seconds. 4. Slowly lower your leg to the starting position. 5. Let your leg relax completely after each repetition. Repeat __________ times. Complete this exercise __________ times a day. This information is not intended to replace advice given to you by your health care provider. Make sure you discuss any questions you have with your health care provider. Document Released: 09/14/2005 Document Revised: 07/25/2016 Document Reviewed: 09/06/2015 Elsevier Interactive Patient Education  2018 [employer].

## 2017-12-25 NOTE — Assessment & Plan Note (Signed)
Increase Lexapro to 10 mg/d 

## 2017-12-25 NOTE — Assessment & Plan Note (Signed)
Monitor PSA 

## 2017-12-25 NOTE — Assessment & Plan Note (Signed)
Using less Tramadol

## 2017-12-26 ENCOUNTER — Other Ambulatory Visit: Payer: Self-pay | Admitting: Internal Medicine

## 2017-12-26 NOTE — Telephone Encounter (Signed)
Routing to dr plotnikov, please advise, thanks 

## 2018-02-12 ENCOUNTER — Other Ambulatory Visit: Payer: Self-pay | Admitting: Internal Medicine

## 2018-02-13 NOTE — Telephone Encounter (Signed)
Check [place] registry last filled 01/05/2018.Marland KitchenJohny Gill

## 2018-02-21 DIAGNOSIS — H524 Presbyopia: Secondary | ICD-10-CM | POA: Diagnosis not present

## 2018-03-18 DIAGNOSIS — H6123 Impacted cerumen, bilateral: Secondary | ICD-10-CM | POA: Diagnosis not present

## 2018-03-18 DIAGNOSIS — J029 Acute pharyngitis, unspecified: Secondary | ICD-10-CM | POA: Diagnosis not present

## 2018-03-19 ENCOUNTER — Encounter: Payer: Self-pay | Admitting: Internal Medicine

## 2018-03-19 ENCOUNTER — Other Ambulatory Visit: Payer: Self-pay | Admitting: Internal Medicine

## 2018-04-16 ENCOUNTER — Other Ambulatory Visit: Payer: Self-pay | Admitting: Internal Medicine

## 2018-04-23 ENCOUNTER — Other Ambulatory Visit: Payer: Self-pay | Admitting: Internal Medicine

## 2018-04-25 ENCOUNTER — Encounter: Payer: Self-pay | Admitting: Internal Medicine

## 2018-04-25 ENCOUNTER — Ambulatory Visit (INDEPENDENT_AMBULATORY_CARE_PROVIDER_SITE_OTHER): Payer: Medicare HMO | Admitting: Internal Medicine

## 2018-04-25 ENCOUNTER — Other Ambulatory Visit (INDEPENDENT_AMBULATORY_CARE_PROVIDER_SITE_OTHER): Payer: Medicare HMO

## 2018-04-25 VITALS — BP 124/76 | HR 76 | Temp 98.3°F | Ht 72.0 in | Wt 236.0 lb

## 2018-04-25 DIAGNOSIS — M5440 Lumbago with sciatica, unspecified side: Secondary | ICD-10-CM

## 2018-04-25 DIAGNOSIS — F419 Anxiety disorder, unspecified: Secondary | ICD-10-CM

## 2018-04-25 DIAGNOSIS — R251 Tremor, unspecified: Secondary | ICD-10-CM | POA: Diagnosis not present

## 2018-04-25 DIAGNOSIS — E785 Hyperlipidemia, unspecified: Secondary | ICD-10-CM

## 2018-04-25 DIAGNOSIS — E291 Testicular hypofunction: Secondary | ICD-10-CM

## 2018-04-25 DIAGNOSIS — M150 Primary generalized (osteo)arthritis: Secondary | ICD-10-CM | POA: Diagnosis not present

## 2018-04-25 DIAGNOSIS — R7309 Other abnormal glucose: Secondary | ICD-10-CM

## 2018-04-25 DIAGNOSIS — Z0000 Encounter for general adult medical examination without abnormal findings: Secondary | ICD-10-CM

## 2018-04-25 DIAGNOSIS — K2270 Barrett's esophagus without dysplasia: Secondary | ICD-10-CM

## 2018-04-25 DIAGNOSIS — K2100 Gastro-esophageal reflux disease with esophagitis, without bleeding: Secondary | ICD-10-CM

## 2018-04-25 DIAGNOSIS — N320 Bladder-neck obstruction: Secondary | ICD-10-CM

## 2018-04-25 DIAGNOSIS — M2550 Pain in unspecified joint: Secondary | ICD-10-CM | POA: Diagnosis not present

## 2018-04-25 DIAGNOSIS — M159 Polyosteoarthritis, unspecified: Secondary | ICD-10-CM

## 2018-04-25 HISTORY — DX: Testicular hypofunction: E29.1

## 2018-04-25 LAB — HEPATIC FUNCTION PANEL
ALT: 31 U/L (ref 0–53)
AST: 22 U/L (ref 0–37)
Albumin: 4.6 g/dL (ref 3.5–5.2)
Alkaline Phosphatase: 66 U/L (ref 39–117)
BILIRUBIN DIRECT: 0.1 mg/dL (ref 0.0–0.3)
TOTAL PROTEIN: 7.5 g/dL (ref 6.0–8.3)
Total Bilirubin: 0.5 mg/dL (ref 0.2–1.2)

## 2018-04-25 LAB — PSA: PSA: 1.16 ng/mL (ref 0.10–4.00)

## 2018-04-25 LAB — URINALYSIS
BILIRUBIN URINE: NEGATIVE
Hgb urine dipstick: NEGATIVE
Ketones, ur: NEGATIVE
LEUKOCYTES UA: NEGATIVE
NITRITE: NEGATIVE
SPECIFIC GRAVITY, URINE: 1.01 (ref 1.000–1.030)
Total Protein, Urine: NEGATIVE
Urine Glucose: NEGATIVE
Urobilinogen, UA: 0.2 (ref 0.0–1.0)
pH: 7.5 (ref 5.0–8.0)

## 2018-04-25 LAB — BASIC METABOLIC PANEL
BUN: 14 mg/dL (ref 6–23)
CHLORIDE: 103 meq/L (ref 96–112)
CO2: 27 meq/L (ref 19–32)
Calcium: 9.9 mg/dL (ref 8.4–10.5)
Creatinine, Ser: 0.89 mg/dL (ref 0.40–1.50)
GFR: 91.23 mL/min (ref >60.00)
Glucose, Bld: 110 mg/dL — ABNORMAL HIGH (ref 70–99)
Potassium: 4.3 mEq/L (ref 3.5–5.1)
SODIUM: 138 meq/L (ref 135–145)

## 2018-04-25 LAB — HEMOGLOBIN A1C: Hgb A1c MFr Bld: 5.7 % (ref 4.6–6.5)

## 2018-04-25 LAB — CBC WITH DIFFERENTIAL/PLATELET
BASOS ABS: 0 10*3/uL (ref 0.0–0.1)
Basophils Relative: 0.7 % (ref 0.0–3.0)
Eosinophils Absolute: 0.1 10*3/uL (ref 0.0–0.7)
Eosinophils Relative: 2.1 % (ref 0.0–5.0)
HCT: 44.8 % (ref 39.0–52.0)
Hemoglobin: 15.5 g/dL (ref 13.0–17.0)
LYMPHS ABS: 2.6 10*3/uL (ref 0.7–4.0)
Lymphocytes Relative: 38.9 % (ref 12.0–46.0)
MCHC: 34.7 g/dL (ref 30.0–36.0)
MCV: 92.5 fl (ref 78.0–100.0)
MONO ABS: 0.8 10*3/uL (ref 0.1–1.0)
MONOS PCT: 11.6 % (ref 3.0–12.0)
NEUTROS PCT: 46.7 % (ref 43.0–77.0)
Neutro Abs: 3.1 10*3/uL (ref 1.4–7.7)
Platelets: 234 10*3/uL (ref 150.0–400.0)
RBC: 4.84 Mil/uL (ref 4.22–5.81)
RDW: 13.6 % (ref 11.5–15.5)
WBC: 6.7 10*3/uL (ref 4.0–10.5)

## 2018-04-25 LAB — LIPID PANEL
CHOLESTEROL: 235 mg/dL — AB (ref 0–200)
HDL: 37.9 mg/dL — ABNORMAL LOW (ref >39.00)
NonHDL: 197.1
Total CHOL/HDL Ratio: 6
Triglycerides: 255 mg/dL — ABNORMAL HIGH (ref 0.0–149.0)
VLDL: 51 mg/dL — ABNORMAL HIGH (ref 0.0–40.0)

## 2018-04-25 LAB — TSH: TSH: 4.42 u[IU]/mL (ref 0.35–4.50)

## 2018-04-25 LAB — LDL CHOLESTEROL, DIRECT: Direct LDL: 151 mg/dL

## 2018-04-25 NOTE — Assessment & Plan Note (Signed)
lexapro

## 2018-04-25 NOTE — Assessment & Plan Note (Signed)
Here for medicare wellness/physical  Diet: heart healthy  Physical activity: not sedentary  Depression/mood screen: negative  Hearing: intact to whispered voice  Visual acuity: grossly normal w/glasses, performs annual eye exam  ADLs: capable - bad OA Fall risk: low to none  Home safety: good  Cognitive evaluation: intact to orientation, naming, recall and repetition  EOL planning: adv directives, full code/ I agree  I have personally reviewed and have noted  1. The patient's medical, surgical and social history  2. Their use of alcohol, tobacco or illicit drugs  3. Their current medications and supplements  4. The patient's functional ability including ADL's, fall risks, home safety risks and hearing or visual impairment.  5. Diet and physical activities  6. Evidence for depression or mood disorders 7. The roster of all physicians providing medical care to patient - is listed in the Snapshot section of the chart and reviewed today.    Today patient counseled on age appropriate routine health concerns for screening and prevention, each reviewed and up to date or declined. Immunizations reviewed and up to date or declined. Labs ordered and reviewed. Risk factors for depression reviewed and negative. Hearing function and visual acuity are intact. ADLs screened and addressed as needed. Functional ability and level of safety reviewed and appropriate. Education, counseling and referrals performed based on assessed risks today. Patient provided with a copy of personalized plan for preventive services.

## 2018-04-25 NOTE — Assessment & Plan Note (Signed)
Tramadol - rare

## 2018-04-25 NOTE — Patient Instructions (Signed)

## 2018-04-25 NOTE — Assessment & Plan Note (Signed)
Declined testosterone Rx

## 2018-04-25 NOTE — Assessment & Plan Note (Signed)
Rare Tramadol prn

## 2018-04-25 NOTE — Progress Notes (Signed)
Subjective:  Patient ID: [name], male    DOB: Oct 25, 1951  Age: 67 y.o. MRN: [id number]  CC: No chief complaint on file.   HPI MATTHAN SLEDGE presents for anxiety, OA, depression f/u. Brother died w/CHF Drinking 3 cokes a day C/o low testosterone Well exam   Outpatient Medications Prior to Visit  Medication Sig Dispense Refill  . Alum Hydroxide-Mag Carbonate (GAVISCON PO) Take 1 tablet by mouth daily as needed (indigestion).    . Ascorbic Acid (VITAMIN C) 1000 MG tablet Take 1,000 mg by mouth daily.    [name] aspirin 81 MG tablet Take 81 mg by mouth at bedtime.     . cholecalciferol (VITAMIN D) 1000 units tablet Take 1,000 Units by mouth daily.    . Clotrimazole (LOTRIMIN AF JOCK ITCH EX) Apply 1 application topically daily as needed (sweat).    . Cyanocobalamin (VITAMIN B-12) 2500 MCG SUBL Place 5,000 mcg under the tongue daily.    [name] escitalopram (LEXAPRO) 10 MG tablet Take 1 tablet (10 mg total) by mouth daily. 90 tablet 3  . fluticasone (FLONASE) 50 MCG/ACT nasal spray USE 1 SPRAY(S) IN EACH NOSTRIL ONCE DAILY 48 g 3  . meloxicam (MOBIC) 7.5 MG tablet TAKE 2 TABLETS BY MOUTH ONCE DAILY 180 tablet 1  . Multiple Vitamin (MULTIVITAMIN WITH MINERALS) TABS tablet Take 1 tablet by mouth daily.    . Nutritional Supplements (JUICE PLUS FIBRE PO) Take 3 tablets by mouth 2 (two) times daily.     . ondansetron (ZOFRAN) 4 MG tablet TAKE 1 TO 2 TABLETS BY MOUTH EVERY 4 TO 6 HOURS AS NEEDED FOR NAUSEA 30 tablet 3  . pantoprazole (PROTONIX) 40 MG tablet Take 1 tablet (40 mg total) 2 (two) times daily by mouth. 180 tablet 3  . Red Yeast Rice 600 MG TABS Take 1,200 mg by mouth daily.    . traMADol (ULTRAM) 50 MG tablet TAKE 1 TO 2 TABLETS BY MOUTH TWICE DAILY AS NEEDED FOR PAIN 120 tablet 1  . Turmeric 500 MG TABS Take 500 mg by mouth 2 (two) times daily.    . valACYclovir (VALTREX) 500 MG tablet TAKE 1 TABLET BY MOUTH ONCE DAILY 90 tablet 3  . nitroGLYCERIN (NITROSTAT) 0.4 MG SL tablet Place 1  tablet (0.4 mg total) under the tongue every 5 (five) minutes as needed for chest pain. 11 tablet 6   No facility-administered medications prior to visit.     ROS: Review of Systems  Constitutional: Positive for fatigue and unexpected weight change. Negative for appetite change.  HENT: Negative for congestion, nosebleeds, sneezing, sore throat and trouble swallowing.   Eyes: Negative for itching and visual disturbance.  Respiratory: Negative for cough.   Cardiovascular: Negative for chest pain, palpitations and leg swelling.  Gastrointestinal: Negative for abdominal distention, blood in stool, diarrhea and nausea.  Genitourinary: Negative for frequency and hematuria.  Musculoskeletal: Positive for arthralgias, back pain and gait problem. Negative for joint swelling and neck pain.  Skin: Negative for rash.  Neurological: Negative for dizziness, tremors, speech difficulty and weakness.  Psychiatric/Behavioral: Negative for agitation, dysphoric mood and sleep disturbance. The patient is nervous/anxious.     Objective:  BP 124/76 (BP Location: Left Arm, Patient Position: Sitting, Cuff Size: Large)   Pulse 76   Temp 98.3 F (36.8 C) (Oral)   Ht 6' (1.829 m)   Wt 236 lb (107 kg)   SpO2 98%   BMI 32.01 kg/m   BP Readings from Last 3 Encounters:  04/25/18 124/76  12/25/17 126/82  09/29/17 126/72    Wt Readings from Last 3 Encounters:  04/25/18 236 lb (107 kg)  12/25/17 227 lb (103 kg)  09/29/17 237 lb (107.5 kg)    Physical Exam  Constitutional: He is oriented to person, place, and time. He appears well-developed. No distress.  NAD  HENT:  Mouth/Throat: Oropharynx is clear and moist.  Eyes: Pupils are equal, round, and reactive to light. Conjunctivae are normal.  Neck: Normal range of motion. No JVD present. No thyromegaly present.  Cardiovascular: Normal rate, regular rhythm, normal heart sounds and intact distal pulses. Exam reveals no gallop and no friction rub.  No  murmur heard. Pulmonary/Chest: Effort normal and breath sounds normal. No respiratory distress. He has no wheezes. He has no rales. He exhibits no tenderness.  Abdominal: Soft. Bowel sounds are normal. He exhibits no distension and no mass. There is no tenderness. There is no rebound and no guarding.  Genitourinary: Rectum normal. Rectal exam shows guaiac negative stool.  Musculoskeletal: Normal range of motion. He exhibits tenderness. He exhibits no edema.  Lymphadenopathy:    He has no cervical adenopathy.  Neurological: He is alert and oriented to person, place, and time. He has normal reflexes. No cranial nerve deficit. He exhibits normal muscle tone. He displays a negative Romberg sign. Coordination and gait normal.  Skin: Skin is warm and dry. No rash noted.  Psychiatric: He has a normal mood and affect. His behavior is normal. Judgment and thought content normal.  tender knees Prostate 1+ Stiff LS, hips, knees   Lab Results  Component Value Date   WBC 7.3 08/21/2017   HGB 14.0 08/21/2017   HCT 41.4 08/21/2017   PLT 214 08/21/2017   GLUCOSE 103 (H) 09/29/2017   CHOL 225 (H) 07/12/2017   TRIG 159.0 (H) 07/12/2017   HDL 38.80 (L) 07/12/2017   LDLDIRECT 102.0 06/10/2015   LDLCALC 154 (H) 07/12/2017   ALT 29 07/12/2017   AST 18 07/12/2017   NA 138 09/29/2017   K 4.4 09/29/2017   CL 102 09/29/2017   CREATININE 0.97 09/29/2017   BUN 25 (H) 09/29/2017   CO2 28 09/29/2017   TSH 1.42 07/12/2017   PSA 0.84 07/12/2017   INR 1.0 08/29/2017   HGBA1C 5.6 09/29/2017    No results found.  Assessment & Plan:   There are no diagnoses linked to this encounter.   No orders of the defined types were placed in this encounter.    Follow-up: No follow-ups on file.  Walker Kehr, MD

## 2018-04-25 NOTE — Assessment & Plan Note (Signed)
Resolved

## 2018-05-24 ENCOUNTER — Other Ambulatory Visit: Payer: Self-pay | Admitting: Internal Medicine

## 2018-06-02 IMAGING — CR DG CHEST 2V
2 series · 2 of 2 positions shown · non-contrast
Comparison: 11/12/2015 CT chest

CLINICAL DATA: Chest pain worsening this morning. Sweating and
blurred vision.

EXAM:
CHEST  2 VIEW

[w chest pa]
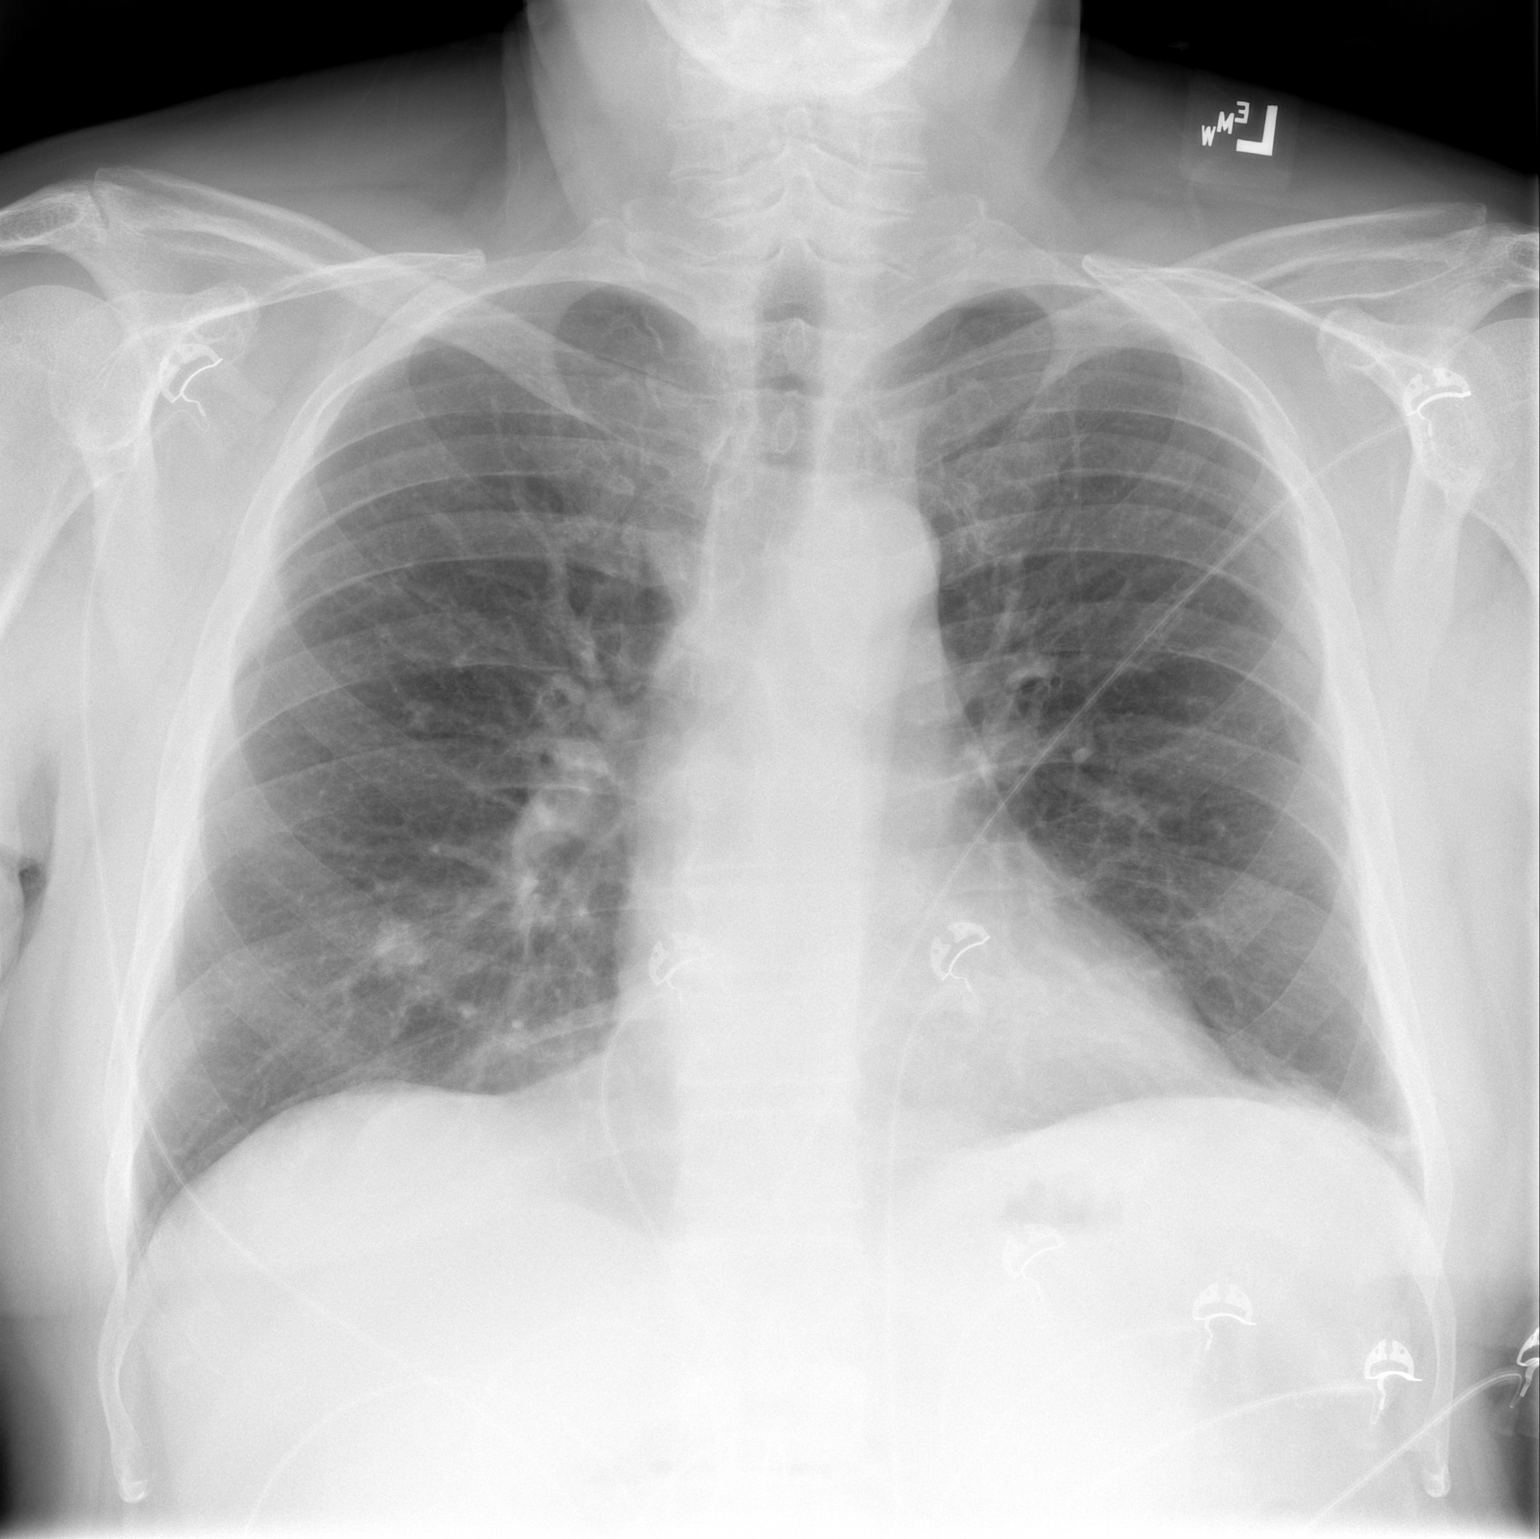

[w chest lat]
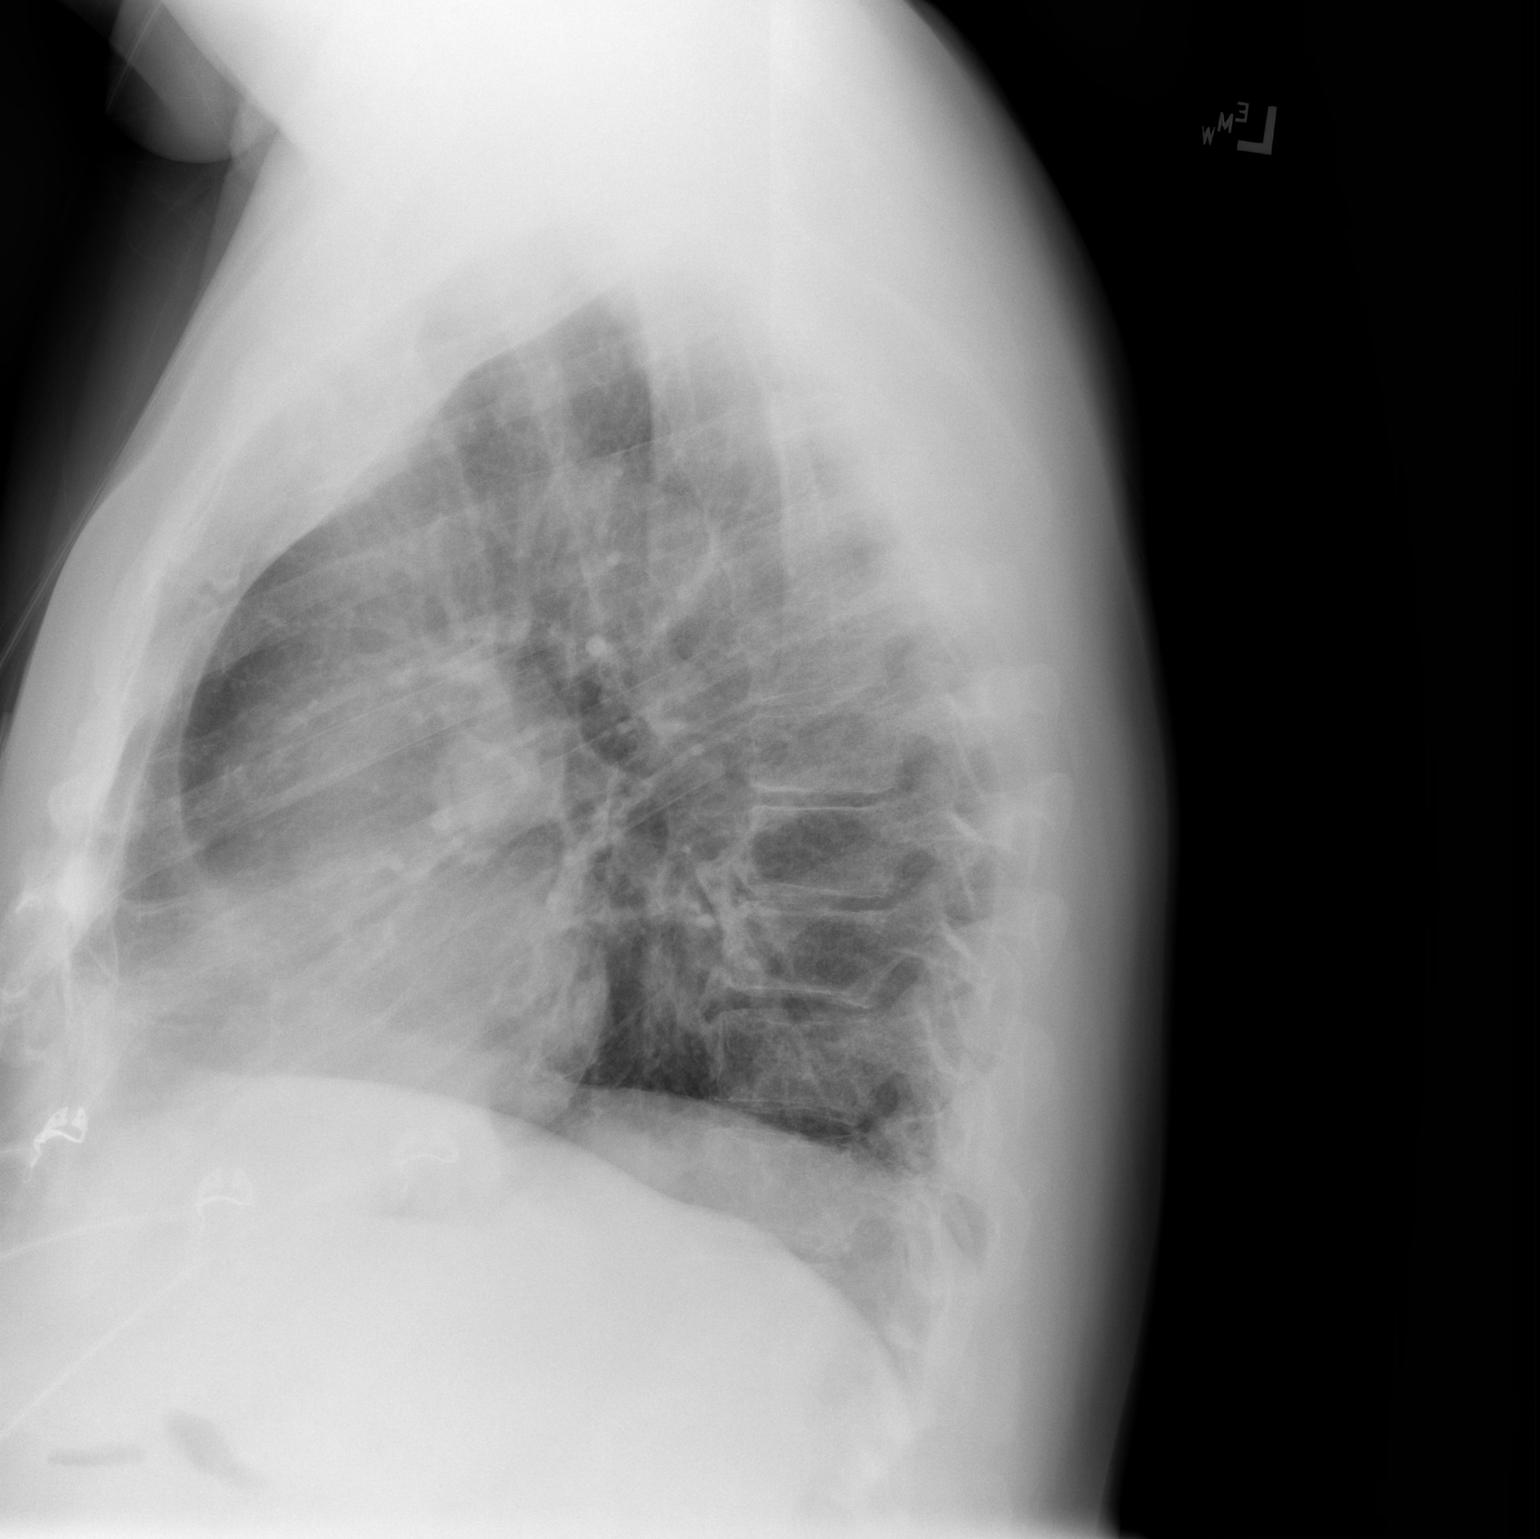

[2 of 2 positions shown; findings below may reference images not displayed]

FINDINGS: Stable scarring along the left hemidiaphragm. Stable sclerotic
lesion in the left anterior fifth rib, unchanged from 07/17/2012.

Cardiac and mediastinal margins appear normal. No pleural effusion.
Mild lower thoracic spondylosis.
IMPRESSION: 1.  No active cardiopulmonary disease is radiographically apparent.
2. Chronic scarring along the left hemidiaphragm.
3. Chronic benign sclerotic lesion anteriorly in the right fifth rib
accounting for the density in this vicinity.

## 2018-06-19 NOTE — Progress Notes (Signed)
Office Visit Note  Patient: [name]             Date of Birth: October 10, 1951           MRN: [id number]             PCP: Andre Anger, MD Referring: Andre Anger, MD Visit Date: 07/03/2018 Occupation: retired   Subjective:  Pain in multiple joints  History of Present Illness: [name] is a 67 y.o. male in consultation per request of his PCP.  According to patient he has had history of bilateral knee joint pain for many years.  He had left knee joint meniscal tear repair at about 45 years ago.  He states 6 years ago he had a left total knee replacement but continues to have a lot of pain and discomfort in his left knee.  He was also advised to have right total knee replacement but he declined.  He has constant pain in his both knees.  He also describes pain in his neck and lower back.  He states he was diagnosed with DDD of cervical and lumbar spine and he has been going to a chiropractor for many years.  He describes pain over bilateral hips which she describes over the trochanteric area.  He has been playing guitar recently and has developed left fourth trigger finger.  He has some discomfort in his shoulders.  He has noticed swelling in his bilateral knee joints.  None of the other joints are swollen.  He has been taking Mobic and tramadol which helps with the pain management.  He states despite taking the medication his pain level on 0-10 could be about 7-8.  He does have nocturnal pain.  Activities of Daily Living:  Patient reports morning stiffness for  all day.   Patient Reports nocturnal pain.  Difficulty dressing/grooming: Denies Difficulty climbing stairs: Reports Difficulty getting out of chair: Reports Difficulty using hands for taps, buttons, cutlery, and/or writing: Denies  Review of Systems  Constitutional: Positive for fatigue. Negative for night sweats.  HENT: Negative for mouth sores, trouble swallowing, trouble swallowing, mouth dryness and nose  dryness.   Eyes: Positive for dryness. Negative for redness and visual disturbance.  Respiratory: Positive for cough. Negative for hemoptysis, shortness of breath and difficulty breathing.   Cardiovascular: Negative for chest pain, palpitations, hypertension, irregular heartbeat and swelling in legs/feet.  Gastrointestinal: Negative for blood in stool, constipation and diarrhea.  Endocrine: Negative for increased urination.  Genitourinary: Negative for painful urination.  Musculoskeletal: Positive for arthralgias, joint pain, myalgias, morning stiffness, muscle tenderness and myalgias. Negative for joint swelling and muscle weakness.  Skin: Positive for rash. Negative for color change, hair loss, nodules/bumps, skin tightness, ulcers and sensitivity to sunlight.  Allergic/Immunologic: Negative for susceptible to infections.  Neurological: Negative for dizziness, fainting, memory loss, night sweats and weakness.  Hematological: Negative for swollen glands.  Psychiatric/Behavioral: Negative for depressed mood and sleep disturbance. The patient is not nervous/anxious.     PMFS History:  Patient Active Problem List   Diagnosis Date Noted  . Hypogonadism in male 04/25/2018  . Tremor 09/15/2017  . Anxiety 08/24/2017  . Nausea 07/18/2017  . Dizzinesses 07/18/2017  . Arthralgia 04/14/2017  . 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 Heart murmur 06/09/2015  . Low back pain 06/09/2015  . Fatigue 06/09/2015  . Hyperglycemia 06/09/2015  . Scrotal mass 06/09/2015  . Bladder neck obstruction 02/03/2015  . OSA (obstructive sleep apnea) 08/21/2013  . Sinusitis, chronic  08/12/2013  . Dyslipidemia 06/21/2013  . 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 Barrett esophagus 07/20/2012  . CAD (coronary artery disease) 07/19/2012  . Ingrowing toenail of left foot 07/06/2012  . Preop exam for internal medicine 06/18/2012  . Well adult exam 08/04/2011  . HAND PAIN 12/24/2010  . NEOPLASM OF UNCERTAIN BEHAVIOR OF  SKIN 06/21/2010  . Acute sinusitis 06/21/2010  . ACTINIC KERATOSIS 06/21/2010  . CONTACT DERMATITIS DUE TO SOLVENTS 01/28/2010  . KNEE PAIN 12/21/2009  . Osteoarthritis 11/21/2008  . HERPES, GENITAL NOS 05/09/2008  . ONYCHOMYCOSIS 05/09/2008  . GERD 05/09/2008  . COLONIC POLYPS, HX OF 05/09/2008    Past Medical History:  Diagnosis Date  . Actinic keratosis   . Colonic polyp   . COLONIC POLYPS, HX OF   . CONTACT DERMATITIS DUE TO SOLVENTS   . DOE (dyspnea on exertion) 07-25-12   related to eating shrimp  . GERD (gastroesophageal reflux disease)   . HAND PAIN   . HERPES, GENITAL NOS   . Ingrowing toenail of left foot 07-25-12   is resolved now 07-25-12  . KNEE PAIN   . Neoplasm of uncertain behavior of skin   . OA (osteoarthritis) of knee   . ONYCHOMYCOSIS   . OSTEOARTHRITIS   . Preop exam for internal medicine   . SINUSITIS, ACUTE   . Well adult exam     Family History  Problem Relation Age of Onset  . Diabetes Mother 8  . Heart disease Father 57       CAD, MI, CHF  . Lung cancer Brother        smoker  . Coronary artery disease Other   . Colon polyps Brother   . Heart disease Brother 32       CHF  . Healthy Daughter   . Colon cancer Neg Hx   . Stomach cancer Neg Hx    Past Surgical History:  Procedure Laterality Date  . JOINT REPLACEMENT  9/13   L TKR  . KNEE ARTHROSCOPY     Left  . LEFT HEART CATH AND CORONARY ANGIOGRAPHY N/A 08/31/2017   Procedure: LEFT HEART CATH AND CORONARY ANGIOGRAPHY;  Surgeon: Nelva Bush, MD;  Location: [facility];  Service: Cardiovascular;  Laterality: N/A;  . TOTAL KNEE ARTHROPLASTY  07/31/2012   Procedure: TOTAL KNEE ARTHROPLASTY;  Surgeon: Sydnee Cabal, MD;  Location: [facility];  Service: Orthopedics;  Laterality: Left;  . UMBILICAL HERNIA REPAIR  07-25-12   10 yrs ago   Social History   Social History Narrative  . Not on file    Objective: Vital Signs: BP (!) 150/88 (BP Location: Right Arm, Patient Position:  Sitting, Cuff Size: Normal)   Pulse 70   Resp 16   Ht 5' 10.47" (1.79 m)   Wt 244 lb 6.4 oz (110.9 kg)   BMI 34.60 kg/m    Physical Exam  Constitutional: He is oriented to person, place, and time. He appears well-developed and well-nourished.  HENT:  Head: Normocephalic and atraumatic.  Eyes: Pupils are equal, round, and reactive to light. Conjunctivae and EOM are normal.  Neck: Normal range of motion. Neck supple.  Cardiovascular: Normal rate, regular rhythm and normal heart sounds.  Pulmonary/Chest: Effort normal and breath sounds normal.  Abdominal: Soft. Bowel sounds are normal.  Lymphadenopathy:    He has no cervical adenopathy.  Neurological: He is alert and oriented to person, place, and time.  Skin: Skin is warm and dry. Capillary refill takes  less than 2 seconds.  Psychiatric: He has a normal mood and affect. His behavior is normal.  Nursing note and vitals reviewed.    Musculoskeletal Exam: C-spine limited range of motion.  Lumbar spine limited range of motion.  He has some discomfort range of motion of his bilateral shoulder joints.  Elbow joints were in good range of motion.  He has bilateral DIP and PIP thickening consistent with osteoarthritis but no synovitis was noted.  He had tenderness over bilateral trochanteric bursa.  Left total knee replacement has some warmth.  Right knee joint had no warmth but discomfort range of motion.  Ankle joints MTPs PIPs were in good range of motion with no synovitis.  CDAI Exam: No CDAI exam completed.   Investigation: No additional findings. Component     Latest Ref Rng & Units 04/25/2018  Total Bilirubin     0.2 - 1.2 mg/dL 0.5  Bilirubin, Direct     0.0 - 0.3 mg/dL 0.1  Alkaline Phosphatase     39 - 117 U/L 66  AST     0 - 37 U/L 22  ALT     0 - 53 U/L 31  Total Protein     6.0 - 8.3 g/dL 7.5  Albumin     3.5 - 5.2 g/dL 4.6  Cholesterol     0 - 200 mg/dL 235 (H)  Triglycerides     0.0 - 149.0 mg/dL 255.0 (H)  HDL  Cholesterol     >39.00 mg/dL 37.90 (L)  VLDL     0.0 - 40.0 mg/dL 51.0 (H)  Total CHOL/HDL Ratio      6  NonHDL      197.10  TSH     0.35 - 4.50 uIU/mL 4.42  PSA     0.10 - 4.00 ng/mL 1.16  Hemoglobin A1C     4.6 - 6.5 % 5.7   Imaging: Xr Cervical Spine 2 Or 3 Views  Result Date: 07/03/2018 Mild spondylosis was noted.  C5-6 and C6-7 narrowing was noted. Impression: These findings are consistent with the mild spondylosis of the cervical spine.  Xr Knee 3 View Right  Result Date: 07/03/2018 Mild to moderate medial compartment narrowing was noted.  Chondrocalcinosis was noted.  Moderate patellofemoral narrowing was noted. Impression: These findings are consistent with moderate osteoarthritis and moderate chondromalacia patella.  He also has chondrocalcinosis.  Xr Lumbar Spine 2-3 Views  Result Date: 07/03/2018 Multilevel spondylosis was noted.  Anterior osteophytes and facet joint arthropathy was noted.  Significant disc space narrowing was noted between L2- L3. Impression: DDD lumbar spine and facet joint arthropathy.   Recent Labs: Lab Results  Component Value Date   WBC 6.7 04/25/2018   HGB 15.5 04/25/2018   PLT 234.0 04/25/2018   NA 138 04/25/2018   K 4.3 04/25/2018   CL 103 04/25/2018   CO2 27 04/25/2018   GLUCOSE 110 (H) 04/25/2018   BUN 14 04/25/2018   CREATININE 0.89 04/25/2018   BILITOT 0.5 04/25/2018   ALKPHOS 66 04/25/2018   AST 22 04/25/2018   ALT 31 04/25/2018   PROT 7.5 04/25/2018   ALBUMIN 4.6 04/25/2018   CALCIUM 9.9 04/25/2018   GFRAA >60 08/21/2017    Speciality Comments: No specialty comments available.  Procedures:  No procedures performed Allergies: Hytrin [terazosin]; Lipitor [atorvastatin]; Penicillins; Reglan [metoclopramide]; and Xanax [alprazolam]   Assessment / Plan:     Visit Diagnoses: Polyarthralgia -gives history of pain in multiple joints.  He also gives history  of intermittent swelling.  I do not see any synovitis on  examination.  I will obtain following labs today.- Plan: Rheumatoid factor, Sedimentation rate, Angiotensin converting enzyme, Cyclic citrul peptide antibody, IgG, Uric acid, HLA-B27 antigen  Primary osteoarthritis of both hands-clinical findings are consistent with osteoarthritis in his hands.  Trigger finger, left ring finger-he has intermittent trigger finger.  If he has ongoing symptoms we may consider injection in future.  Chondrocalcinosis -chondrocalcinosis was noted in his right knee joint x-ray.  Detailed counseling was provided.  He has been taking Mobic which should be able to control his symptoms.  Plan: Iron, TIBC and Ferritin Panel, Magnesium  Chronic pain of both knees -he had left total knee replacement.  He has mild to moderate osteoarthritis in his right knee with chondromalacia patella.  Plan: XR KNEE 3 VIEW RIGHT  Status post total left knee replacement  DDD (degenerative disc disease), cervical - Plan: XR Cervical Spine 2 or 3 views.  The x-rays are consistent with mild spondylosis.  I have given her a handout on C-spine exercises.  DDD (degenerative disc disease), lumbar - Plan: XR Lumbar Spine 2-3 Views.  The x-rays show significant degenerative disc disease.  I have given him a handout on back exercises.  Other medical problems are listed as follows: History of gastroesophageal reflux (GERD)  Barrett's esophagus with dysplasia  History of coronary artery disease  Dyslipidemia  Heart murmur  Tremor  Hand eczema  History of colonic polyps   Orders: Orders Placed This Encounter  Procedures  . XR KNEE 3 VIEW RIGHT  . XR Lumbar Spine 2-3 Views  . XR Cervical Spine 2 or 3 views  . Rheumatoid factor  . Sedimentation rate  . Angiotensin converting enzyme  . Cyclic citrul peptide antibody, IgG  . Uric acid  . Iron, TIBC and Ferritin Panel  . Magnesium  . HLA-B27 antigen   No orders of the defined types were placed in this encounter.   Face-to-face  time spent with patient was 50 minutes. Greater than 50% of time was spent in counseling and coordination of care.  Follow-Up Instructions: Return for Osteoarthritis, DDD.   Bo Merino, MD  Note - This record has been created using Editor, commissioning.  Chart creation errors have been sought, but may not always  have been located. Such creation errors do not reflect on  the standard of medical care.

## 2018-07-03 ENCOUNTER — Ambulatory Visit (INDEPENDENT_AMBULATORY_CARE_PROVIDER_SITE_OTHER): Payer: Self-pay

## 2018-07-03 ENCOUNTER — Ambulatory Visit: Payer: Medicare HMO | Admitting: Rheumatology

## 2018-07-03 ENCOUNTER — Encounter: Payer: Self-pay | Admitting: Rheumatology

## 2018-07-03 VITALS — BP 150/88 | HR 70 | Resp 16 | Ht 70.47 in | Wt 244.4 lb

## 2018-07-03 DIAGNOSIS — M25562 Pain in left knee: Secondary | ICD-10-CM | POA: Diagnosis not present

## 2018-07-03 DIAGNOSIS — M65342 Trigger finger, left ring finger: Secondary | ICD-10-CM

## 2018-07-03 DIAGNOSIS — Z8719 Personal history of other diseases of the digestive system: Secondary | ICD-10-CM

## 2018-07-03 DIAGNOSIS — M1120 Other chondrocalcinosis, unspecified site: Secondary | ICD-10-CM | POA: Diagnosis not present

## 2018-07-03 DIAGNOSIS — Z860100 Personal history of colon polyps, unspecified: Secondary | ICD-10-CM

## 2018-07-03 DIAGNOSIS — M2550 Pain in unspecified joint: Secondary | ICD-10-CM | POA: Diagnosis not present

## 2018-07-03 DIAGNOSIS — K22719 Barrett's esophagus with dysplasia, unspecified: Secondary | ICD-10-CM

## 2018-07-03 DIAGNOSIS — Z96652 Presence of left artificial knee joint: Secondary | ICD-10-CM

## 2018-07-03 DIAGNOSIS — M51369 Other intervertebral disc degeneration, lumbar region without mention of lumbar back pain or lower extremity pain: Secondary | ICD-10-CM

## 2018-07-03 DIAGNOSIS — M19041 Primary osteoarthritis, right hand: Secondary | ICD-10-CM

## 2018-07-03 DIAGNOSIS — M19042 Primary osteoarthritis, left hand: Secondary | ICD-10-CM | POA: Diagnosis not present

## 2018-07-03 DIAGNOSIS — L309 Dermatitis, unspecified: Secondary | ICD-10-CM

## 2018-07-03 DIAGNOSIS — M25561 Pain in right knee: Secondary | ICD-10-CM

## 2018-07-03 DIAGNOSIS — E785 Hyperlipidemia, unspecified: Secondary | ICD-10-CM

## 2018-07-03 DIAGNOSIS — G8929 Other chronic pain: Secondary | ICD-10-CM

## 2018-07-03 DIAGNOSIS — R251 Tremor, unspecified: Secondary | ICD-10-CM

## 2018-07-03 DIAGNOSIS — M5030 Other cervical disc degeneration, unspecified cervical region: Secondary | ICD-10-CM

## 2018-07-03 DIAGNOSIS — M5136 Other intervertebral disc degeneration, lumbar region: Secondary | ICD-10-CM

## 2018-07-03 DIAGNOSIS — Z8679 Personal history of other diseases of the circulatory system: Secondary | ICD-10-CM

## 2018-07-03 DIAGNOSIS — R011 Cardiac murmur, unspecified: Secondary | ICD-10-CM

## 2018-07-03 NOTE — Patient Instructions (Signed)
Cervical Strain and Sprain Rehab Ask your health care provider which exercises are safe for you. Do exercises exactly as told by your health care provider and adjust them as directed. It is normal to feel mild stretching, pulling, tightness, or discomfort as you do these exercises, but you should stop right away if you feel sudden pain or your pain gets worse.Do not begin these exercises until told by your health care provider. Stretching and range of motion exercises These exercises warm up your muscles and joints and improve the movement and flexibility of your neck. These exercises also help to relieve pain, numbness, and tingling. Exercise A: Cervical side bend  1. Using good posture, sit on a stable chair or stand up. 2. Without moving your shoulders, slowly tilt your left / right ear to your shoulder until you feel a stretch in your neck muscles. You should be looking straight ahead. 3. Hold for __________ seconds. 4. Repeat with the other side of your neck. Repeat __________ times. Complete this exercise __________ times a day. Exercise B: Cervical rotation  1. Using good posture, sit on a stable chair or stand up. 2. Slowly turn your head to the side as if you are looking over your left / right shoulder. ? Keep your eyes level with the ground. ? Stop when you feel a stretch along the side and the back of your neck. 3. Hold for __________ seconds. 4. Repeat this by turning to your other side. Repeat __________ times. Complete this exercise __________ times a day. Exercise C: Thoracic extension and pectoral stretch 1. Roll a towel or a small blanket so it is about 4 inches (10 cm) in diameter. 2. Lie down on your back on a firm surface. 3. Put the towel lengthwise, under your spine in the middle of your back. It should not be not under your shoulder blades. The towel should line up with your spine from your middle back to your lower back. 4. Put your hands behind your head and let your  elbows fall out to your sides. 5. Hold for __________ seconds. Repeat __________ times. Complete this exercise __________ times a day. Strengthening exercises These exercises build strength and endurance in your neck. Endurance is the ability to use your muscles for a long time, even after your muscles get tired. Exercise D: Upper cervical flexion, isometric 1. Lie on your back with a thin pillow behind your head and a small rolled-up towel under your neck. 2. Gently tuck your chin toward your chest and nod your head down to look toward your feet. Do not lift your head off the pillow. 3. Hold for __________ seconds. 4. Release the tension slowly. Relax your neck muscles completely before you repeat this exercise. Repeat __________ times. Complete this exercise __________ times a day. Exercise E: Cervical extension, isometric  1. Stand about 6 inches (15 cm) away from a wall, with your back facing the wall. 2. Place a soft object, about 6-8 inches (15-20 cm) in diameter, between the back of your head and the wall. A soft object could be a small pillow, a ball, or a folded towel. 3. Gently tilt your head back and press into the soft object. Keep your jaw and forehead relaxed. 4. Hold for __________ seconds. 5. Release the tension slowly. Relax your neck muscles completely before you repeat this exercise. Repeat __________ times. Complete this exercise __________ times a day. Posture and body mechanics  Body mechanics refers to the movements and positions of   your body while you do your daily activities. Posture is part of body mechanics. Good posture and healthy body mechanics can help to relieve stress in your body's tissues and joints. Good posture means that your spine is in its natural S-curve position (your spine is neutral), your shoulders are pulled back slightly, and your head is not tipped forward. The following are general guidelines for applying improved posture and body mechanics to  your everyday activities. Standing  When standing, keep your spine neutral and keep your feet about hip-width apart. Keep a slight bend in your knees. Your ears, shoulders, and hips should line up.  When you do a task in which you stand in one place for a long time, place one foot up on a stable object that is 2-4 inches (5-10 cm) high, such as a footstool. This helps keep your spine neutral. Sitting   When sitting, keep your spine neutral and your keep feet flat on the floor. Use a footrest, if necessary, and keep your thighs parallel to the floor. Avoid rounding your shoulders, and avoid tilting your head forward.  When working at a desk or a computer, keep your desk at a height where your hands are slightly lower than your elbows. Slide your chair under your desk so you are close enough to maintain good posture.  When working at a computer, place your monitor at a height where you are looking straight ahead and you do not have to tilt your head forward or downward to look at the screen. Resting When lying down and resting, avoid positions that are most painful for you. Try to support your neck in a neutral position. You can use a contour pillow or a small rolled-up towel. Your pillow should support your neck but not push on it. This information is not intended to replace advice given to you by your health care provider. Make sure you discuss any questions you have with your health care provider. Document Released: 10/31/2005 Document Revised: 07/07/2016 Document Reviewed: 10/07/2015 Elsevier Interactive Patient Education  2018 [employer]. Back Exercises The following exercises strengthen the muscles that help to support the back. They also help to keep the lower back flexible. Doing these exercises can help to prevent back pain or lessen existing pain. If you have back pain or discomfort, try doing these exercises 2-3 times each day or as told by your health care provider. When the pain  goes away, do them once each day, but increase the number of times that you repeat the steps for each exercise (do more repetitions). If you do not have back pain or discomfort, do these exercises once each day or as told by your health care provider. Exercises Single Knee to Chest  Repeat these steps 3-5 times for each leg: 1. Lie on your back on a firm bed or the floor with your legs extended. 2. Bring one knee to your chest. Your other leg should stay extended and in contact with the floor. 3. Hold your knee in place by grabbing your knee or thigh. 4. Pull on your knee until you feel a gentle stretch in your lower back. 5. Hold the stretch for 10-30 seconds. 6. Slowly release and straighten your leg.  Pelvic Tilt  Repeat these steps 5-10 times: 1. Lie on your back on a firm bed or the floor with your legs extended. 2. Bend your knees so they are pointing toward the ceiling and your feet are flat on the floor. 3. Tighten  your lower abdominal muscles to press your lower back against the floor. This motion will tilt your pelvis so your tailbone points up toward the ceiling instead of pointing to your feet or the floor. 4. With gentle tension and even breathing, hold this position for 5-10 seconds.  Cat-Cow  Repeat these steps until your lower back becomes more flexible: 1. Get into a hands-and-knees position on a firm surface. Keep your hands under your shoulders, and keep your knees under your hips. You may place padding under your knees for comfort. 2. Let your head hang down, and point your tailbone toward the floor so your lower back becomes rounded like the back of a cat. 3. Hold this position for 5 seconds. 4. Slowly lift your head and point your tailbone up toward the ceiling so your back forms a sagging arch like the back of a cow. 5. Hold this position for 5 seconds.  Press-Ups  Repeat these steps 5-10 times: 1. Lie on your abdomen (face-down) on the floor. 2. Place your  palms near your head, about shoulder-width apart. 3. While you keep your back as relaxed as possible and keep your hips on the floor, slowly straighten your arms to raise the top half of your body and lift your shoulders. Do not use your back muscles to raise your upper torso. You may adjust the placement of your hands to make yourself more comfortable. 4. Hold this position for 5 seconds while you keep your back relaxed. 5. Slowly return to lying flat on the floor.  Bridges  Repeat these steps 10 times: 1. Lie on your back on a firm surface. 2. Bend your knees so they are pointing toward the ceiling and your feet are flat on the floor. 3. Tighten your buttocks muscles and lift your buttocks off of the floor until your waist is at almost the same height as your knees. You should feel the muscles working in your buttocks and the back of your thighs. If you do not feel these muscles, slide your feet 1-2 inches farther away from your buttocks. 4. Hold this position for 3-5 seconds. 5. Slowly lower your hips to the starting position, and allow your buttocks muscles to relax completely.  If this exercise is too easy, try doing it with your arms crossed over your chest. Abdominal Crunches  Repeat these steps 5-10 times: 1. Lie on your back on a firm bed or the floor with your legs extended. 2. Bend your knees so they are pointing toward the ceiling and your feet are flat on the floor. 3. Cross your arms over your chest. 4. Tip your chin slightly toward your chest without bending your neck. 5. Tighten your abdominal muscles and slowly raise your trunk (torso) high enough to lift your shoulder blades a tiny bit off of the floor. Avoid raising your torso higher than that, because it can put too much stress on your low back and it does not help to strengthen your abdominal muscles. 6. Slowly return to your starting position.  Back Lifts Repeat these steps 5-10 times: 1. Lie on your abdomen  (face-down) with your arms at your sides, and rest your forehead on the floor. 2. Tighten the muscles in your legs and your buttocks. 3. Slowly lift your chest off of the floor while you keep your hips pressed to the floor. Keep the back of your head in line with the curve in your back. Your eyes should be looking at the floor. 4.  Hold this position for 3-5 seconds. 5. Slowly return to your starting position.  Contact a health care provider if:  Your back pain or discomfort gets much worse when you do an exercise.  Your back pain or discomfort does not lessen within 2 hours after you exercise. If you have any of these problems, stop doing these exercises right away. Do not do them again unless your health care provider says that you can. Get help right away if:  You develop sudden, severe back pain. If this happens, stop doing the exercises right away. Do not do them again unless your health care provider says that you can. This information is not intended to replace advice given to you by your health care provider. Make sure you discuss any questions you have with your health care provider. Document Released: 12/08/2004 Document Revised: 03/09/2016 Document Reviewed: 12/25/2014 Elsevier Interactive Patient Education  2017 [employer]  

## 2018-07-04 LAB — MAGNESIUM: Magnesium: 1.8 mg/dL (ref 1.5–2.5)

## 2018-07-04 LAB — IRON,TIBC AND FERRITIN PANEL
%SAT: 28 % (calc) (ref 20–48)
Ferritin: 92 ng/mL (ref 24–380)
IRON: 90 ug/dL (ref 50–180)
TIBC: 322 ug/dL (ref 250–425)

## 2018-07-04 LAB — URIC ACID: URIC ACID, SERUM: 3.9 mg/dL — AB (ref 4.0–8.0)

## 2018-07-04 LAB — SEDIMENTATION RATE: SED RATE: 6 mm/h (ref 0–20)

## 2018-07-04 LAB — ANGIOTENSIN CONVERTING ENZYME: Angiotensin-Converting Enzyme: 53 U/L (ref 9–67)

## 2018-07-04 LAB — RHEUMATOID FACTOR

## 2018-07-04 LAB — HLA-B27 ANTIGEN: HLA-B27 Antigen: NEGATIVE

## 2018-07-04 LAB — CYCLIC CITRUL PEPTIDE ANTIBODY, IGG: Cyclic Citrullin Peptide Ab: <16 UNITS

## 2018-07-04 NOTE — Progress Notes (Signed)
Office Visit Note  Patient: [name]             Date of Birth: 07/26/1951           MRN: [id number]             PCP: Cassandria Anger, MD Referring: Cassandria Anger, MD Visit Date: 07/17/2018 Occupation: _0 @  Subjective:  Pain in multiple joints   History of Present Illness: [name] is a 67 y.o. male with history of DDD, chondrocalcinosis, and osteoarthritis.  He takes Mobic 7.5 mg po BID. patient reports that he continues to have discomfort in multiple joints including bilateral knees, bilateral hips, bilateral hands.  He states his pain is most severe in his right knee joint.  He denies any joint swelling at this time.  He states the pain wakes him up at night.  He states the pain is most severe if he tries kneeling on his knee joints.  He states that he has been doing exercises on a daily basis.  He takes tramadol for pain relief.   Activities of Daily Living:  Patient reports morning stiffness for several hours.   Patient Reports nocturnal pain.  Difficulty dressing/grooming: Reports Difficulty climbing stairs: Reports Difficulty getting out of chair: Reports Difficulty using hands for taps, buttons, cutlery, and/or writing: Denies  Review of Systems  Constitutional: Positive for fatigue. Negative for night sweats.  HENT: Negative for mouth sores, trouble swallowing, trouble swallowing, mouth dryness and nose dryness.   Eyes: Positive for redness and dryness. Negative for pain.  Respiratory: Negative for cough, hemoptysis, shortness of breath, wheezing and difficulty breathing.   Cardiovascular: Negative for chest pain, palpitations, hypertension, irregular heartbeat and swelling in legs/feet.  Gastrointestinal: Negative for abdominal pain, blood in stool, constipation and diarrhea.  Endocrine: Negative for increased urination.  Genitourinary: Negative for painful urination, pelvic pain and urgency.  Musculoskeletal: Positive for arthralgias, joint  pain, joint swelling and morning stiffness. Negative for myalgias, muscle weakness, muscle tenderness and myalgias.  Skin: Positive for rash. Negative for color change, hair loss, nodules/bumps, skin tightness, ulcers and sensitivity to sunlight.  Allergic/Immunologic: Negative for susceptible to infections.  Neurological: Negative for dizziness, fainting, light-headedness, headaches, memory loss and night sweats.  Hematological: Negative for bruising/bleeding tendency and swollen glands.  Psychiatric/Behavioral: Negative for depressed mood, confusion and sleep disturbance. The patient is not nervous/anxious.     PMFS History:  Patient Active Problem List   Diagnosis Date Noted  . DDD (degenerative disc disease), cervical 07/06/2018  . DDD (degenerative disc disease), lumbar 07/06/2018  . Chondrocalcinosis 07/06/2018  . Hypogonadism in male 04/25/2018  . Tremor 09/15/2017  . Anxiety 08/24/2017  . Nausea 07/18/2017  . Dizzinesses 07/18/2017  . Arthralgia 04/14/2017  . 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 Heart murmur 06/09/2015  . Low back pain 06/09/2015  . Fatigue 06/09/2015  . Hyperglycemia 06/09/2015  . Scrotal mass 06/09/2015  . Bladder neck obstruction 02/03/2015  . OSA (obstructive sleep apnea) 08/21/2013  . Sinusitis, chronic 08/12/2013  . Dyslipidemia 06/21/2013  . 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 Barrett esophagus 07/20/2012  . CAD (coronary artery disease) 07/19/2012  . Ingrowing toenail of left foot 07/06/2012  . Preop exam for internal medicine 06/18/2012  . Well adult exam 08/04/2011  . HAND PAIN 12/24/2010  . NEOPLASM OF UNCERTAIN BEHAVIOR OF SKIN 06/21/2010  . Acute sinusitis 06/21/2010  . ACTINIC KERATOSIS 06/21/2010  . CONTACT DERMATITIS DUE TO SOLVENTS 01/28/2010  . KNEE  PAIN 12/21/2009  . Primary osteoarthritis of both hands 11/21/2008  . HERPES, GENITAL NOS 05/09/2008  . ONYCHOMYCOSIS 05/09/2008  . GERD 05/09/2008  . COLONIC POLYPS, HX OF  05/09/2008    Past Medical History:  Diagnosis Date  . Actinic keratosis   . Colonic polyp   . COLONIC POLYPS, HX OF   . CONTACT DERMATITIS DUE TO SOLVENTS   . DOE (dyspnea on exertion) 07-25-12   related to eating shrimp  . GERD (gastroesophageal reflux disease)   . HAND PAIN   . HERPES, GENITAL NOS   . Ingrowing toenail of left foot 07-25-12   is resolved now 07-25-12  . KNEE PAIN   . Neoplasm of uncertain behavior of skin   . OA (osteoarthritis) of knee   . ONYCHOMYCOSIS   . OSTEOARTHRITIS   . Preop exam for internal medicine   . SINUSITIS, ACUTE   . Well adult exam     Family History  Problem Relation Age of Onset  . Diabetes Mother 69  . Heart disease Father 21       CAD, MI, CHF  . Lung cancer Brother        smoker  . Coronary artery disease Other   . Colon polyps Brother   . Heart disease Brother 66       CHF  . Healthy Daughter   . Colon cancer Neg Hx   . Stomach cancer Neg Hx    Past Surgical History:  Procedure Laterality Date  . JOINT REPLACEMENT  9/13   L TKR  . KNEE ARTHROSCOPY     Left  . LEFT HEART CATH AND CORONARY ANGIOGRAPHY N/A 08/31/2017   Procedure: LEFT HEART CATH AND CORONARY ANGIOGRAPHY;  Surgeon: Nelva Bush, MD;  Location: [facility];  Service: Cardiovascular;  Laterality: N/A;  . TOTAL KNEE ARTHROPLASTY  07/31/2012   Procedure: TOTAL KNEE ARTHROPLASTY;  Surgeon: Sydnee Cabal, MD;  Location: [facility];  Service: Orthopedics;  Laterality: Left;  . UMBILICAL HERNIA REPAIR  07-25-12   10 yrs ago   Social History   Social History Narrative  . Not on file    Objective: Vital Signs: BP 138/87 (BP Location: Left Arm, Patient Position: Sitting, Cuff Size: Normal)   Pulse 70   Resp 15   Ht 5' 10.47" (1.79 m)   Wt 234 lb 12.8 oz (106.5 kg)   BMI 33.24 kg/m    Physical Exam  Constitutional: He is oriented to person, place, and time. He appears well-developed and well-nourished.  HENT:  Head: Normocephalic and atraumatic.    Eyes: Pupils are equal, round, and reactive to light. Conjunctivae and EOM are normal.  Neck: Normal range of motion. Neck supple.  Cardiovascular: Normal rate, regular rhythm and normal heart sounds.  Pulmonary/Chest: Effort normal and breath sounds normal.  Abdominal: Soft. Bowel sounds are normal. A hernia is present.  Lymphadenopathy:    He has no cervical adenopathy.  Neurological: He is alert and oriented to person, place, and time.  Skin: Skin is warm and dry. Capillary refill takes less than 2 seconds.  Psychiatric: He has a normal mood and affect. His behavior is normal.  Nursing note and vitals reviewed.    Musculoskeletal Exam: C-spine good ROM.  Mild thoracic kyphosis.  Shoulder joints, elbow joints, wrist joints, MCPs, PIPs, and DIPs good ROM with no synovitis.  PIP and DIP synovial thickening consistent with osteoarthritis.  Left ring trigger finger. Hip joints, knee joints, ankle joints, MTPs, PIPs, and  DIPs good ROM with no synovitis.  Left knee replaced.  No warmth or effusion of knee joints.   CDAI Exam: CDAI Score: Not documented Patient Global Assessment: Not documented; Provider Global Assessment: Not documented Swollen: Not documented; Tender: Not documented Joint Exam   Not documented   There is currently no information documented on the homunculus. Go to the Rheumatology activity and complete the homunculus joint exam.  Investigation: No additional findings.  Imaging: Xr Cervical Spine 2 Or 3 Views  Result Date: 07/03/2018 Mild spondylosis was noted.  C5-6 and C6-7 narrowing was noted. Impression: These findings are consistent with the mild spondylosis of the cervical spine.  Xr Knee 3 View Right  Result Date: 07/03/2018 Mild to moderate medial compartment narrowing was noted.  Chondrocalcinosis was noted.  Moderate patellofemoral narrowing was noted. Impression: These findings are consistent with moderate osteoarthritis and moderate chondromalacia  patella.  He also has chondrocalcinosis.  Xr Lumbar Spine 2-3 Views  Result Date: 07/03/2018 Multilevel spondylosis was noted.  Anterior osteophytes and facet joint arthropathy was noted.  Significant disc space narrowing was noted between L2- L3. Impression: DDD lumbar spine and facet joint arthropathy.   Recent Labs: Lab Results  Component Value Date   WBC 6.7 04/25/2018   HGB 15.5 04/25/2018   PLT 234.0 04/25/2018   NA 138 04/25/2018   K 4.3 04/25/2018   CL 103 04/25/2018   CO2 27 04/25/2018   GLUCOSE 110 (H) 04/25/2018   BUN 14 04/25/2018   CREATININE 0.89 04/25/2018   BILITOT 0.5 04/25/2018   ALKPHOS 66 04/25/2018   AST 22 04/25/2018   ALT 31 04/25/2018   PROT 7.5 04/25/2018   ALBUMIN 4.6 04/25/2018   CALCIUM 9.9 04/25/2018   GFRAA >60 08/21/2017  July 03, 2018 iron studies normal, RF negative, anti-CCP negative, uric acid 3.9, ACE 53, HLA-B27 negative, magnesium 1.8, uric acid 3.9  Speciality Comments: No specialty comments available.  Procedures:  Large Joint Inj: R knee on 07/17/2018 10:03 AM Indications: pain Details: 27 G 1.5 in needle, medial approach  Arthrogram: No  Medications: 1.5 mL lidocaine 1 %; 40 mg triamcinolone acetonide 40 MG/ML Aspirate: 0 mL Outcome: tolerated well, no immediate complications Procedure, treatment alternatives, risks and benefits explained, specific risks discussed. Consent was given by the patient. Immediately prior to procedure a time out was called to verify the correct patient, procedure, equipment, support staff and site/side marked as required. Patient was prepped and draped in the usual sterile fashion.     Allergies: Hytrin [terazosin]; Lipitor [atorvastatin]; Penicillins; Reglan [metoclopramide]; and Xanax [alprazolam]   Assessment / Plan:     Visit Diagnoses: DDD (degenerative disc disease), cervical: He has good range of motion with discomfort.  He performs neck exercises on a daily basis.  DDD (degenerative disc  disease), lumbar - and facet joint arthropathy: Chronic pain.  No midline spinal tenderness.  Chondrocalcinosis: He takes Mobic 7.5 mg twice daily.  Right knee cortisone injection was performed today in the office.  He tolerated the procedure well.  Primary osteoarthritis of both hands - All autoimmune work-up negative.  Patient gives history of intermittent swelling.  He has no synovitis on exam.  He has PIP and DIP synovial thickening consistent with osteoarthritis of bilateral hands.  Joint protection and muscle strengthening were discussed.  Primary osteoarthritis of right knee - moderate OA with chondrocalcinosis: No warmth or effusion.  Good range of motion.  His chronic pain in his right knee joint.  The pain wakes him  up at night.  He does not have any mechanical symptoms at this time.  He is given a list of anti-inflammatories that he can take.  A cortisone injection was performed in the office today.  Consent was obtained.  Aftercare instructions were provided.  He plans and continue to take Mobic 7.5 mg twice daily and tramadol as needed for pain relief.  Chronic pain of right knee: Chronic pain.  No warmth or effusion noted.  He has good range of motion.  We discussed the natural anti-inflammatories that he can start taking.  He was given a list today in the office.  He was encouraged to continue to perform the exercises on a daily basis.  He requested a cortisone injection today in the office.  He tolerated the procedure well.  Status post total left knee replacement:  Chronic pain.  No warmth or effusion.  Has good range of motion.  Other medical conditions are listed as follows:  Anxiety  Dyslipidemia  Gastroesophageal reflux disease with esophagitis  Hyperglycemia  OSA (obstructive sleep apnea)  History of coronary artery disease   Orders: Orders Placed This Encounter  Procedures  . Large Joint Inj   No orders of the defined types were placed in this  encounter.  Face-to-face time spent with patient was 30 minutes. Greater than 50% of time was spent in counseling and coordination of care.  Follow-Up Instructions: Return if symptoms worsen or fail to improve, for Osteoarthritis, DDD.   Ofilia Neas, PA-C   I examined and evaluated the patient with Hazel Sams PA.  Detailed counseling regarding chondrocalcinosis was provided.  The lab work from last visit was discussed.  He does have underlying osteoarthritis which causes discomfort.  Different treatment options were discussed.  Right knee joint was injected with cortisone as described above.  Need for regular exercise and joint protection was discussed.  The plan of care was discussed as noted above.  Bo Merino, MD  Note - This record has been created using Editor, commissioning.  Chart creation errors have been sought, but may not always  have been located. Such creation errors do not reflect on  the standard of medical care.

## 2018-07-05 NOTE — Progress Notes (Signed)
Will discuss at follow up visit

## 2018-07-06 DIAGNOSIS — M1120 Other chondrocalcinosis, unspecified site: Secondary | ICD-10-CM

## 2018-07-06 DIAGNOSIS — M5030 Other cervical disc degeneration, unspecified cervical region: Secondary | ICD-10-CM | POA: Insufficient documentation

## 2018-07-06 DIAGNOSIS — M5136 Other intervertebral disc degeneration, lumbar region: Secondary | ICD-10-CM | POA: Insufficient documentation

## 2018-07-06 DIAGNOSIS — M51369 Other intervertebral disc degeneration, lumbar region without mention of lumbar back pain or lower extremity pain: Secondary | ICD-10-CM | POA: Insufficient documentation

## 2018-07-06 HISTORY — DX: Other chondrocalcinosis, unspecified site: M11.20

## 2018-07-06 HISTORY — DX: Other intervertebral disc degeneration, lumbar region: M51.36

## 2018-07-06 HISTORY — DX: Other cervical disc degeneration, unspecified cervical region: M50.30

## 2018-07-06 HISTORY — DX: Other intervertebral disc degeneration, lumbar region without mention of lumbar back pain or lower extremity pain: M51.369

## 2018-07-17 ENCOUNTER — Ambulatory Visit: Payer: Medicare HMO | Admitting: Rheumatology

## 2018-07-17 ENCOUNTER — Encounter: Payer: Self-pay | Admitting: Rheumatology

## 2018-07-17 VITALS — BP 138/87 | HR 70 | Resp 15 | Ht 70.47 in | Wt 234.8 lb

## 2018-07-17 DIAGNOSIS — M1711 Unilateral primary osteoarthritis, right knee: Secondary | ICD-10-CM

## 2018-07-17 DIAGNOSIS — K2100 Gastro-esophageal reflux disease with esophagitis, without bleeding: Secondary | ICD-10-CM

## 2018-07-17 DIAGNOSIS — M1120 Other chondrocalcinosis, unspecified site: Secondary | ICD-10-CM | POA: Diagnosis not present

## 2018-07-17 DIAGNOSIS — M5136 Other intervertebral disc degeneration, lumbar region: Secondary | ICD-10-CM | POA: Diagnosis not present

## 2018-07-17 DIAGNOSIS — M5030 Other cervical disc degeneration, unspecified cervical region: Secondary | ICD-10-CM

## 2018-07-17 DIAGNOSIS — Z8679 Personal history of other diseases of the circulatory system: Secondary | ICD-10-CM

## 2018-07-17 DIAGNOSIS — R739 Hyperglycemia, unspecified: Secondary | ICD-10-CM

## 2018-07-17 DIAGNOSIS — G8929 Other chronic pain: Secondary | ICD-10-CM | POA: Diagnosis not present

## 2018-07-17 DIAGNOSIS — E785 Hyperlipidemia, unspecified: Secondary | ICD-10-CM

## 2018-07-17 DIAGNOSIS — F419 Anxiety disorder, unspecified: Secondary | ICD-10-CM

## 2018-07-17 DIAGNOSIS — G4733 Obstructive sleep apnea (adult) (pediatric): Secondary | ICD-10-CM

## 2018-07-17 DIAGNOSIS — Z96652 Presence of left artificial knee joint: Secondary | ICD-10-CM | POA: Diagnosis not present

## 2018-07-17 DIAGNOSIS — M25561 Pain in right knee: Secondary | ICD-10-CM | POA: Diagnosis not present

## 2018-07-17 DIAGNOSIS — M19042 Primary osteoarthritis, left hand: Secondary | ICD-10-CM

## 2018-07-17 DIAGNOSIS — M19041 Primary osteoarthritis, right hand: Secondary | ICD-10-CM

## 2018-07-17 MED ORDER — TRIAMCINOLONE ACETONIDE 40 MG/ML IJ SUSP
40.0000 mg | INTRAMUSCULAR | Status: AC | PRN
  Administered 2018-07-17: 40 mg via INTRA_ARTICULAR

## 2018-07-17 MED ORDER — LIDOCAINE HCL 1 % IJ SOLN
1.5000 mL | INTRAMUSCULAR | Status: AC | PRN
  Administered 2018-07-17: 1.5 mL

## 2018-07-17 NOTE — Patient Instructions (Addendum)
Natural anti-inflammatories  You can purchase these at [employer], [employer] or online.  . Turmeric (capsules)  . Ginger (ginger root or capsules)  . Omega 3 (Fish, flax seeds, chia seeds, walnuts, almonds)  . Tart cherry (dried or extract)   Patient should be under the care of a physician while taking these supplements. This may not be reproduced without the permission of Dr. Bo Merino.   Calcium Pyrophosphate Deposition Calcium pyrophosphate deposition (CPPD), which is also called pseudogout, is a type of arthritis that causes pain, swelling, and inflammation in a joint. The joint pain can be severe and may last for days. If it is not treated, the pain may last much longer. Attacks of CPPD may come and go. This condition usually affects one joint at a time. The joints that are affected most commonly are the knees, but this condition can also affect the wrists, elbows, shoulders, or ankles. CPPD is similar to gout. Both conditions result from the buildup of crystals in the joint. However, CPPD is caused by a type of crystal that is different than the crystals that cause gout. What are the causes? This condition is caused by the buildup of calcium pyrophosphate dihydrate crystals in the joint. The reason why this buildup occurs is not known. The condition may be passed down from parent to child (hereditary). What increases the risk? This condition is more likely to develop in people who:  Are over 28 years old.  Have a family history of the condition.  Have had joint replacement surgery.  Have had a recent injury.  Have certain medical conditions, such as hemophilia, ochronosis, amyloidosis, or hormonal disorders.  Have low blood magnesium levels.  What are the signs or symptoms? Symptoms of this condition include:  Pain in a joint. The pain may: ? Be intense and constant. ? Come on quickly. ? Get worse with movement. ? Last from several days to a few  weeks.  Redness, swelling, and warmth at the joint.  Stiffness of the joint.  How is this diagnosed? To diagnose this condition, your health care provider will use a needle to remove fluid from the joint. The fluid will be examined under a microscope to check for the crystals that cause CPPD. You may also have imaging tests, such as:  X-rays.  Ultrasound.  How is this treated? There is no way to remove the crystals from the joint and no way to cure this condition. However, treatment can relieve symptoms and improve joint function. Treatment may include:  Nonsteroidal anti-inflammatory drugs (NSAIDs) to reduce inflammation and pain.  Medicines to help prevent attacks.  Injections of medicine (cortisone) into the joint to reduce pain and swelling.  Physical therapy to improve joint function.  Follow these instructions at home:  Take medicines only as directed by your health care provider.  Rest the affected joints until your symptoms start to go away.  Keep your affected joints raised (elevated) when possible. This will help to reduce swelling.  If directed, apply ice to the affected area: ? Put ice in a plastic bag. ? Place a towel between your skin and the bag. ? Leave the ice on for 20 minutes, 2-3 times per day.  If the painful joint is in your leg, use crutches as directed by your health care provider.  When your symptoms start to go away, begin to exercise regularly or do physical therapy. Talk with your health care provider or physical therapist about what types of exercise are safe  for you. Low-impact exercise may be best. This includes walking, swimming, bicycling, and water aerobics.  Maintain a healthy weight so your joints do not need to bear more weight than necessary. Contact a health care provider if:  You have an increase in joint pain that is not relieved with medicine.  Your joint becomes more red, swollen, or stiff.  You have a fever.  You have a  skin rash. This information is not intended to replace advice given to you by your health care provider. Make sure you discuss any questions you have with your health care provider. Document Released: 07/23/2004 Document Revised: 04/07/2016 Document Reviewed: 10/08/2014 Elsevier Interactive Patient Education  [employer].

## 2018-07-27 ENCOUNTER — Ambulatory Visit (INDEPENDENT_AMBULATORY_CARE_PROVIDER_SITE_OTHER): Payer: Medicare HMO | Admitting: Internal Medicine

## 2018-07-27 ENCOUNTER — Encounter: Payer: Self-pay | Admitting: Internal Medicine

## 2018-07-27 VITALS — BP 140/78 | HR 82 | Temp 98.2°F | Ht 70.47 in | Wt 232.0 lb

## 2018-07-27 DIAGNOSIS — Z23 Encounter for immunization: Secondary | ICD-10-CM

## 2018-07-27 DIAGNOSIS — M2550 Pain in unspecified joint: Secondary | ICD-10-CM | POA: Diagnosis not present

## 2018-07-27 DIAGNOSIS — R21 Rash and other nonspecific skin eruption: Secondary | ICD-10-CM

## 2018-07-27 DIAGNOSIS — M5440 Lumbago with sciatica, unspecified side: Secondary | ICD-10-CM | POA: Diagnosis not present

## 2018-07-27 DIAGNOSIS — J069 Acute upper respiratory infection, unspecified: Secondary | ICD-10-CM | POA: Insufficient documentation

## 2018-07-27 HISTORY — DX: Rash and other nonspecific skin eruption: R21

## 2018-07-27 HISTORY — DX: Acute upper respiratory infection, unspecified: J06.9

## 2018-07-27 MED ORDER — LORATADINE 10 MG PO TABS: 10.0000 mg | ORAL_TABLET | Freq: Every day | ORAL | 3 refills | Status: DC

## 2018-07-27 MED ORDER — AZITHROMYCIN 250 MG PO TABS: ORAL_TABLET | ORAL | 0 refills | Status: DC

## 2018-07-27 NOTE — Addendum Note (Signed)
Addended by: Karren Cobble on: 07/27/2018 11:20 AM   Modules accepted: Orders

## 2018-07-27 NOTE — Assessment & Plan Note (Signed)
Rheum consult; Gluten free trial

## 2018-07-27 NOTE — Patient Instructions (Signed)
  Gluten free trial for 4-6 weeks. OK to use gluten-free bread and gluten-free pasta.    Gluten-Free Diet for Celiac Disease, Adult The gluten-free diet includes all foods that do not contain gluten. Gluten is a protein that is found in wheat, rye, barley, and some other grains. Following the gluten-free diet is the only treatment for people with celiac disease. It helps to prevent damage to the intestines and improves or eliminates the symptoms of celiac disease. Following the gluten-free diet requires some planning. It can be challenging at first, but it gets easier with time and practice. There are more gluten-free options available today than ever before. If you need help finding gluten-free foods or if you have questions, talk with your diet and nutrition specialist (registered dietitian) or your health care provider. What do I need to know about a gluten-free diet?  All fruits, vegetables, and meats are safe to eat and do not contain gluten.  When grocery shopping, start by shopping in the produce, meat, and dairy sections. These sections are more likely to contain gluten-free foods. Then move to the aisles that contain packaged foods if you need to.  Read all food labels. Gluten is often added to foods. Always check the ingredient list and look for warnings, such as "may contain gluten."  Talk with your dietitian or health care provider before taking a gluten-free multivitamin or mineral supplement.  Be aware of gluten-free foods having contact with foods that contain gluten (cross-contamination). This can happen at home and with any processed foods. ? Talk with your health care provider or dietitian about how to reduce the risk of cross-contamination in your home. ? If you have questions about how a food is processed, ask the manufacturer. What key words help to identify gluten? Foods that list any of these key words on the label usually contain gluten:  Wheat, flour, enriched  flour, bromated flour, white flour, durum flour, graham flour, phosphated flour, self-rising flour, semolina, farina, barley (malt), rye, and oats.  Starch, dextrin, modified food starch, or cereal.  Thickening, fillers, or emulsifiers.  Malt flavoring, malt extract, or malt syrup.  Hydrolyzed vegetable protein.  In the [place] packaged foods that are gluten-free are required to be labeled "GF." These foods should be easy to identify and are safe to eat. In the [place] food companies are also required to list common food allergens, including wheat, on their labels. Recommended foods Grains  Amaranth, bean flours, 100% buckwheat flour, corn, millet, nut flours or nut meals, GF oats, quinoa, rice, sorghum, teff, rice wafers, pure cornmeal tortillas, popcorn, and hot cereals made from cornmeal. Hominy, rice, wild rice. Some Asian rice noodles or bean noodles. Arrowroot starch, corn bran, corn flour, corn germ, cornmeal, corn starch, potato flour, potato starch flour, and rice bran. Plain, brown, and sweet rice flours. Rice polish, soy flour, and tapioca starch. Vegetables  All plain fresh, frozen, and canned vegetables. Fruits  All plain fresh, frozen, canned, and dried fruits, and 100% fruit juices. Meats and other protein foods  All fresh beef, pork, poultry, fish, seafood, and eggs. Fish canned in water, oil, brine, or vegetable broth. Plain nuts and seeds, peanut butter. Some lunch meat and some frankfurters. Dried beans, dried peas, and lentils. Dairy  Fresh plain, dry, evaporated, or condensed milk. Cream, butter, sour cream, whipping cream, and most yogurts. Unprocessed cheese, most processed cheeses, some cottage cheese, some cream cheeses. Beverages  Coffee, tea, most herbal teas. Carbonated beverages and some root beers.   Wine, sake, and distilled spirits, such as gin, vodka, and whiskey. Most hard ciders. Fats and oils  Butter, margarine, vegetable oil, hydrogenated butter, olive  oil, shortening, lard, cream, and some mayonnaise. Some commercial salad dressings. Olives. Sweets and desserts  Sugar, honey, some syrups, molasses, jelly, and jam. Plain hard candy, marshmallows, and gumdrops. Pure cocoa powder. Plain chocolate. Custard and some pudding mixes. Gelatin desserts, sorbets, frozen ice pops, and sherbet. Cake, cookies, and other desserts prepared with allowed flours. Some commercial ice creams. Cornstarch, tapioca, and rice puddings. Seasoning and other foods  Some canned or frozen soups. Monosodium glutamate (MSG). Cider, rice, and wine vinegar. Baking soda and baking powder. Cream of tartar. Baking and nutritional yeast. Certain soy sauces made without wheat (ask your dietitian about specific brands that are allowed). Nuts, coconut, and chocolate. Salt, pepper, herbs, spices, flavoring extracts, imitation or artificial flavorings, natural flavorings, and food colorings. Some medicines and supplements. Some lip glosses and other cosmetics. Rice syrups. The items listed may not be a complete list. Talk with your dietitian about what dietary choices are best for you. Foods to avoid Grains  Barley, bran, bulgur, couscous, cracked wheat, , farro, graham, malt, matzo, semolina, wheat germ, and all wheat and rye cereals including spelt and kamut. Cereals containing malt as a flavoring, such as rice cereal. Noodles, spaghetti, macaroni, most packaged rice mixes, and all mixes containing wheat, rye, barley, or triticale. Vegetables  Most creamed vegetables and most vegetables canned in sauces. Some commercially prepared vegetables and salads. Fruits  Thickened or prepared fruits and some pie fillings. Some fruit snacks and fruit roll-ups. Meats and other protein foods  Any meat or meat alternative containing wheat, rye, barley, or gluten stabilizers. These are often marinated or packaged meats and lunch meats. Bread-containing products, such as Swiss steak,  croquettes, meatballs, and meatloaf. Most tuna canned in vegetable broth and turkey with hydrolyzed vegetable protein (HVP) injected as part of the basting. Seitan. Imitation fish. Eggs in sauces made from ingredients to avoid. Dairy  Commercial chocolate milk drinks and malted milk. Some non-dairy creamers. Any cheese product containing ingredients to avoid. Beverages  Certain cereal beverages. Beer, ale, malted milk, and some root beers. Some hard ciders. Some instant flavored coffees. Some herbal teas made with barley or with barley malt added. Fats and oils  Some commercial salad dressings. Sour cream containing modified food starch. Sweets and desserts  Some toffees. Chocolate-coated nuts (may be rolled in wheat flour) and some commercial candies and candy bars. Most cakes, cookies, donuts, pastries, and other baked goods. Some commercial ice cream. Ice cream cones. Commercially prepared mixes for cakes, cookies, and other desserts. Bread pudding and other puddings thickened with flour. Products containing brown rice syrup made with barley malt enzyme. Desserts and sweets made with malt flavoring. Seasoning and other foods  Some curry powders, some dry seasoning mixes, some gravy extracts, some meat sauces, some ketchups, some prepared mustards, and horseradish. Certain soy sauces. Malt vinegar. Bouillon and bouillon cubes that contain HVP. Some chip dips, and some chewing gum. Yeast extract. Brewer's yeast. Caramel color. Some medicines and supplements. Some lip glosses and other cosmetics. The items listed may not be a complete list. Talk with your dietitian about what dietary choices are best for you. Summary  Gluten is a protein that is found in wheat, rye, barley, and some other grains. The gluten-free diet includes all foods that do not contain gluten.  If you need help finding gluten-free foods or if   you have questions, talk with your diet and nutrition specialist (registered  dietitian) or your health care provider.  Read all food labels. Gluten is often added to foods. Always check the ingredient list and look for warnings, such as "may contain gluten." This information is not intended to replace advice given to you by your health care provider. Make sure you discuss any questions you have with your health care provider. Document Released: 10/31/2005 Document Revised: 08/15/2016 Document Reviewed: 08/15/2016 Elsevier Interactive Patient Education  2018 [place] can use over-the-counter  "cold" medicines  such as "Tylenol cold" , "Advil cold",  "Mucinex" or" Mucinex D"  for cough and congestion.   Avoid decongestants if you have high blood pressure and use "Afrin" nasal spray for nasal congestion as directed. Use " Delsym" or" Robitussin" cough syrup varietis for cough.  You can use plain "Tylenol" or "Advil" for fever, chills and achyness. Use Halls or Ricola cough drops.   "Common cold" symptoms are usually triggered by a virus.  The antibiotics are usually not necessary. On average, a" viral cold" illness would take 4-7 days to resolve.   Please, make an appointment if you are not better or if you're worse.

## 2018-07-27 NOTE — Progress Notes (Signed)
Subjective:  Patient ID: [name], male    DOB: 1951-05-06  Age: 67 y.o. MRN: [id number]  CC: No chief complaint on file.   HPI [name] presents for LBP, OA C/o URI x days C/o rash on face and body off and on 1 year   Outpatient Medications Prior to Visit  Medication Sig Dispense Refill  . Alum Hydroxide-Mag Carbonate (GAVISCON PO) Take 1 tablet by mouth daily as needed (indigestion).    . Ascorbic Acid (VITAMIN C) 1000 MG tablet Take 1,000 mg by mouth daily.    [name] aspirin 81 MG tablet Take 81 mg by mouth at bedtime.     . cholecalciferol (VITAMIN D) 1000 units tablet Take 1,000 Units by mouth daily.    . Clotrimazole (LOTRIMIN AF JOCK ITCH EX) Apply 1 application topically daily as needed (sweat).    . Cyanocobalamin (VITAMIN B-12) 2500 MCG SUBL Place 5,000 mcg under the tongue daily.    [name] escitalopram (LEXAPRO) 10 MG tablet Take 1 tablet (10 mg total) by mouth daily. (Patient taking differently: Take 5 mg by mouth daily. ) 90 tablet 3  . fluticasone (FLONASE) 50 MCG/ACT nasal spray USE 1 SPRAY(S) IN EACH NOSTRIL ONCE DAILY (Patient taking differently: as needed. ) 48 g 3  . Ginger, Zingiber officinalis, (GINGER ROOT PO) Take by mouth.    . meloxicam (MOBIC) 7.5 MG tablet TAKE 2 TABLETS BY MOUTH ONCE DAILY 180 tablet 1  . Misc Natural Products (TART CHERRY ADVANCED PO) Take by mouth.    . Multiple Vitamin (MULTIVITAMIN WITH MINERALS) TABS tablet Take 1 tablet by mouth daily.    . Nutritional Supplements (JUICE PLUS FIBRE PO) Take 3 tablets by mouth 2 (two) times daily.     . ondansetron (ZOFRAN) 4 MG tablet TAKE 1 TO 2 TABLETS BY MOUTH EVERY 4 TO 6 HOURS AS NEEDED FOR NAUSEA 30 tablet 3  . pantoprazole (PROTONIX) 40 MG tablet Take 1 tablet (40 mg total) 2 (two) times daily by mouth. 180 tablet 3  . Red Yeast Rice 600 MG TABS Take 1,200 mg by mouth daily.    . traMADol (ULTRAM) 50 MG tablet TAKE 1 TO 2 TABLETS BY MOUTH TWICE DAILY AS NEEDED FOR PAIN 120 tablet 3  .  Turmeric 500 MG TABS Take 500 mg by mouth 2 (two) times daily.    . valACYclovir (VALTREX) 500 MG tablet TAKE 1 TABLET BY MOUTH ONCE DAILY 90 tablet 3  . nitroGLYCERIN (NITROSTAT) 0.4 MG SL tablet Place 1 tablet (0.4 mg total) under the tongue every 5 (five) minutes as needed for chest pain. 11 tablet 6   No facility-administered medications prior to visit.     ROS: Review of Systems  Constitutional: Negative for appetite change, fatigue and unexpected weight change.  HENT: Positive for congestion and sore throat. Negative for nosebleeds, sneezing and trouble swallowing.   Eyes: Negative for itching and visual disturbance.  Respiratory: Positive for cough.   Cardiovascular: Negative for chest pain, palpitations and leg swelling.  Gastrointestinal: Negative for abdominal distention, blood in stool, diarrhea and nausea.  Genitourinary: Negative for frequency and hematuria.  Musculoskeletal: Negative for back pain, gait problem, joint swelling and neck pain.  Skin: Positive for rash.  Neurological: Negative for dizziness, tremors, speech difficulty and weakness.  Psychiatric/Behavioral: Negative for agitation, dysphoric mood, sleep disturbance and suicidal ideas. The patient is not nervous/anxious.     Objective:  BP 140/78 (BP Location: Left Arm, Patient Position: Sitting, Cuff Size:  Large)   Pulse 82   Temp 98.2 F (36.8 C) (Oral)   Ht 5' 10.47" (1.79 m)   Wt 232 lb (105.2 kg)   SpO2 96%   BMI 32.85 kg/m   BP Readings from Last 3 Encounters:  07/27/18 140/78  07/17/18 138/87  07/03/18 (!) 150/88    Wt Readings from Last 3 Encounters:  07/27/18 232 lb (105.2 kg)  07/17/18 234 lb 12.8 oz (106.5 kg)  07/03/18 244 lb 6.4 oz (110.9 kg)    Physical Exam  Constitutional: He is oriented to person, place, and time. He appears well-developed. No distress.  NAD  HENT:  Mouth/Throat: Oropharynx is clear and moist.  Eyes: Pupils are equal, round, and reactive to light.  Conjunctivae are normal.  Neck: Normal range of motion. No JVD present. No thyromegaly present.  Cardiovascular: Normal rate, regular rhythm, normal heart sounds and intact distal pulses. Exam reveals no gallop and no friction rub.  No murmur heard. Pulmonary/Chest: Effort normal and breath sounds normal. No respiratory distress. He has no wheezes. He has no rales. He exhibits no tenderness.  Abdominal: Soft. Bowel sounds are normal. He exhibits no distension and no mass. There is no tenderness. There is no rebound and no guarding.  Musculoskeletal: Normal range of motion. He exhibits tenderness. He exhibits no edema.  Lymphadenopathy:    He has no cervical adenopathy.  Neurological: He is alert and oriented to person, place, and time. He has normal reflexes. No cranial nerve deficit. He exhibits normal muscle tone. He displays a negative Romberg sign. Coordination and gait normal.  Skin: Skin is warm and dry. No rash noted.  Psychiatric: He has a normal mood and affect. His behavior is normal. Judgment and thought content normal.  LS, joints w/pain on ROM Swollen nasal mucosa, eryth throat Rash  Lab Results  Component Value Date   WBC 6.7 04/25/2018   HGB 15.5 04/25/2018   HCT 44.8 04/25/2018   PLT 234.0 04/25/2018   GLUCOSE 110 (H) 04/25/2018   CHOL 235 (H) 04/25/2018   TRIG 255.0 (H) 04/25/2018   HDL 37.90 (L) 04/25/2018   LDLDIRECT 151.0 04/25/2018   LDLCALC 154 (H) 07/12/2017   ALT 31 04/25/2018   AST 22 04/25/2018   NA 138 04/25/2018   K 4.3 04/25/2018   CL 103 04/25/2018   CREATININE 0.89 04/25/2018   BUN 14 04/25/2018   CO2 27 04/25/2018   TSH 4.42 04/25/2018   PSA 1.16 04/25/2018   INR 1.0 08/29/2017   HGBA1C 5.7 04/25/2018    No results found.  Assessment & Plan:   There are no diagnoses linked to this encounter.   No orders of the defined types were placed in this encounter.    Follow-up: No follow-ups on file.  Walker Kehr, MD

## 2018-07-27 NOTE — Assessment & Plan Note (Signed)
Gluten free trial Claritin

## 2018-07-27 NOTE — Assessment & Plan Note (Signed)
Gluten free trial

## 2018-07-27 NOTE — Assessment & Plan Note (Signed)
Zpac 

## 2018-08-22 ENCOUNTER — Ambulatory Visit (INDEPENDENT_AMBULATORY_CARE_PROVIDER_SITE_OTHER): Payer: Medicare HMO | Admitting: Internal Medicine

## 2018-08-22 ENCOUNTER — Encounter: Payer: Self-pay | Admitting: Internal Medicine

## 2018-08-22 DIAGNOSIS — J0101 Acute recurrent maxillary sinusitis: Secondary | ICD-10-CM | POA: Diagnosis not present

## 2018-08-22 MED ORDER — LEVOFLOXACIN 500 MG PO TABS: 500.0000 mg | ORAL_TABLET | Freq: Every day | ORAL | 0 refills | Status: DC

## 2018-08-22 NOTE — Progress Notes (Signed)
Subjective:  Patient ID: [name], male    DOB: [birth year]  Age: 67 y.o. MRN: [id number]  CC: No chief complaint on file.   HPI [name] presents for ST, glands are swollen x 3-4 wks. He took a Zpack 3 wks ago   Outpatient Medications Prior to Visit  Medication Sig Dispense Refill  . Alum Hydroxide-Mag Carbonate (GAVISCON PO) Take 1 tablet by mouth daily as needed (indigestion).    . Ascorbic Acid (VITAMIN C) 1000 MG tablet Take 1,000 mg by mouth daily.    [name] aspirin 81 MG tablet Take 81 mg by mouth at bedtime.     [name] azithromycin (ZITHROMAX Z-PAK) 250 MG tablet As directed 6 tablet 0  . cholecalciferol (VITAMIN D) 1000 units tablet Take 1,000 Units by mouth daily.    . Clotrimazole (LOTRIMIN AF JOCK ITCH EX) Apply 1 application topically daily as needed (sweat).    . Cyanocobalamin (VITAMIN B-12) 2500 MCG SUBL Place 5,000 mcg under the tongue daily.    [name] escitalopram (LEXAPRO) 10 MG tablet Take 1 tablet (10 mg total) by mouth daily. (Patient taking differently: Take 5 mg by mouth daily. ) 90 tablet 3  . fluticasone (FLONASE) 50 MCG/ACT nasal spray USE 1 SPRAY(S) IN EACH NOSTRIL ONCE DAILY (Patient taking differently: as needed. ) 48 g 3  . Ginger, Zingiber officinalis, (GINGER ROOT PO) Take by mouth.    . loratadine (CLARITIN) 10 MG tablet Take 1 tablet (10 mg total) by mouth daily. 100 tablet 3  . meloxicam (MOBIC) 7.5 MG tablet TAKE 2 TABLETS BY MOUTH ONCE DAILY 180 tablet 1  . Misc Natural Products (TART CHERRY ADVANCED PO) Take by mouth.    . Multiple Vitamin (MULTIVITAMIN WITH MINERALS) TABS tablet Take 1 tablet by mouth daily.    . Nutritional Supplements (JUICE PLUS FIBRE PO) Take 3 tablets by mouth 2 (two) times daily.     . ondansetron (ZOFRAN) 4 MG tablet TAKE 1 TO 2 TABLETS BY MOUTH EVERY 4 TO 6 HOURS AS NEEDED FOR NAUSEA 30 tablet 3  . pantoprazole (PROTONIX) 40 MG tablet Take 1 tablet (40 mg total) 2 (two) times daily by mouth. 180 tablet 3  . Red Yeast Rice 600  MG TABS Take 1,200 mg by mouth daily.    . traMADol (ULTRAM) 50 MG tablet TAKE 1 TO 2 TABLETS BY MOUTH TWICE DAILY AS NEEDED FOR PAIN 120 tablet 3  . Turmeric 500 MG TABS Take 500 mg by mouth 2 (two) times daily.    . valACYclovir (VALTREX) 500 MG tablet TAKE 1 TABLET BY MOUTH ONCE DAILY 90 tablet 3  . nitroGLYCERIN (NITROSTAT) 0.4 MG SL tablet Place 1 tablet (0.4 mg total) under the tongue every 5 (five) minutes as needed for chest pain. 11 tablet 6   No facility-administered medications prior to visit.     ROS: Review of Systems  Constitutional: Negative for appetite change, fatigue and unexpected weight change.  HENT: Positive for congestion and sore throat. Negative for nosebleeds, sneezing and trouble swallowing.   Eyes: Negative for itching and visual disturbance.  Respiratory: Negative for cough.   Cardiovascular: Negative for chest pain, palpitations and leg swelling.  Gastrointestinal: Negative for abdominal distention, blood in stool, diarrhea and nausea.  Genitourinary: Negative for frequency and hematuria.  Musculoskeletal: Negative for back pain, gait problem, joint swelling and neck pain.  Skin: Negative for rash.  Neurological: Negative for dizziness, tremors, speech difficulty and weakness.  Psychiatric/Behavioral: Negative for agitation,  dysphoric mood and sleep disturbance. The patient is not nervous/anxious.     Objective:  BP 128/76 (BP Location: Left Arm, Patient Position: Sitting, Cuff Size: Large)   Pulse 78   Temp 98.3 F (36.8 C) (Oral)   Ht 5' 10.47" (1.79 m)   Wt 226 lb (102.5 kg)   SpO2 95%   BMI 32.00 kg/m   BP Readings from Last 3 Encounters:  08/22/18 128/76  07/27/18 140/78  07/17/18 138/87    Wt Readings from Last 3 Encounters:  08/22/18 226 lb (102.5 kg)  07/27/18 232 lb (105.2 kg)  07/17/18 234 lb 12.8 oz (106.5 kg)    Physical Exam  Constitutional: He is oriented to person, place, and time. He appears well-developed. No distress.    NAD  HENT:  Mouth/Throat: Oropharynx is clear and moist.  Eyes: Pupils are equal, round, and reactive to light. Conjunctivae are normal.  Neck: Normal range of motion. No JVD present. No thyromegaly present.  Cardiovascular: Normal rate, regular rhythm, normal heart sounds and intact distal pulses. Exam reveals no gallop and no friction rub.  No murmur heard. Pulmonary/Chest: Effort normal and breath sounds normal. No respiratory distress. He has no wheezes. He has no rales. He exhibits no tenderness.  Abdominal: Soft. Bowel sounds are normal. He exhibits no distension and no mass. There is no tenderness. There is no rebound and no guarding.  Musculoskeletal: Normal range of motion. He exhibits no edema or tenderness.  Lymphadenopathy:    He has no cervical adenopathy.  Neurological: He is alert and oriented to person, place, and time. He has normal reflexes. No cranial nerve deficit. He exhibits normal muscle tone. He displays a negative Romberg sign. Coordination and gait normal.  Skin: Skin is warm and dry. No rash noted.  Psychiatric: He has a normal mood and affect. His behavior is normal. Judgment and thought content normal.  eryth throat  Lab Results  Component Value Date   WBC 6.7 04/25/2018   HGB 15.5 04/25/2018   HCT 44.8 04/25/2018   PLT 234.0 04/25/2018   GLUCOSE 110 (H) 04/25/2018   CHOL 235 (H) 04/25/2018   TRIG 255.0 (H) 04/25/2018   HDL 37.90 (L) 04/25/2018   LDLDIRECT 151.0 04/25/2018   LDLCALC 154 (H) 07/12/2017   ALT 31 04/25/2018   AST 22 04/25/2018   NA 138 04/25/2018   K 4.3 04/25/2018   CL 103 04/25/2018   CREATININE 0.89 04/25/2018   BUN 14 04/25/2018   CO2 27 04/25/2018   TSH 4.42 04/25/2018   PSA 1.16 04/25/2018   INR 1.0 08/29/2017   HGBA1C 5.7 04/25/2018    No results found.  Assessment & Plan:   There are no diagnoses linked to this encounter.   No orders of the defined types were placed in this encounter.    Follow-up: No  follow-ups on file.  Walker Kehr, MD

## 2018-08-22 NOTE — Assessment & Plan Note (Addendum)
Refractory  Levaquin x10 d RTC if not better

## 2018-08-22 NOTE — Patient Instructions (Signed)
You can use over-the-counter  "cold" medicines  such as  "Afrin" nasal spray for nasal congestion as directed. Use " Delsym" or" Robitussin" cough syrup varietis for cough.  You can use plain "Tylenol" or "Advil" for fever, chills and achyness. Use Halls or Ricola cough drops.     Please, make an appointment if you are not better or if you're worse.  

## 2018-08-23 ENCOUNTER — Ambulatory Visit: Payer: Self-pay

## 2018-08-23 NOTE — Telephone Encounter (Signed)
Please advise 

## 2018-08-23 NOTE — Telephone Encounter (Signed)
Pt. Took one dose of Levolfoxacin last night and had the symptoms below. Instructed not to take anymore of it until he hears back from the doctor. Please advise pt. States the worst of the symptoms has subsided.Wants to be sure the cramping and burning in his lower extremities "aren't permanent like it said on the pharmacy information sheet."  Answer Assessment - Initial Assessment Questions 1. SYMPTOMS: "Do you have any symptoms?"     Had palpitations, chest pain, numbness in hands last night after one dose of Levofloxin last night.Those symptoms are gone. Has cramping and burning sensation from both hips down.Feels like this is definitely a reaction to the antibiotic. 2. SEVERITY: If symptoms are present, ask "Are they mild, moderate or severe?"     Moderate  Protocols used: MEDICATION QUESTION CALL-A-AH

## 2018-08-24 MED ORDER — DOXYCYCLINE HYCLATE 100 MG PO TABS: 100.0000 mg | ORAL_TABLET | Freq: Two times a day (BID) | ORAL | 0 refills | Status: DC

## 2018-08-24 NOTE — Telephone Encounter (Signed)
D/c Levaquin [facility] emailed Thx

## 2018-08-24 NOTE — Addendum Note (Signed)
Addended by: Cassandria Anger on: 08/24/2018 07:55 AM   Modules accepted: Orders

## 2018-08-24 NOTE — Telephone Encounter (Signed)
Pt.notified

## 2018-09-05 ENCOUNTER — Ambulatory Visit (INDEPENDENT_AMBULATORY_CARE_PROVIDER_SITE_OTHER): Payer: Medicare HMO | Admitting: Internal Medicine

## 2018-09-05 ENCOUNTER — Other Ambulatory Visit (INDEPENDENT_AMBULATORY_CARE_PROVIDER_SITE_OTHER): Payer: Medicare HMO

## 2018-09-05 ENCOUNTER — Encounter: Payer: Self-pay | Admitting: Internal Medicine

## 2018-09-05 DIAGNOSIS — R221 Localized swelling, mass and lump, neck: Secondary | ICD-10-CM

## 2018-09-05 HISTORY — DX: Localized swelling, mass and lump, neck: R22.1

## 2018-09-05 LAB — CBC WITH DIFFERENTIAL/PLATELET
BASOS ABS: 0.1 10*3/uL (ref 0.0–0.1)
Basophils Relative: 1.3 % (ref 0.0–3.0)
EOS ABS: 0.1 10*3/uL (ref 0.0–0.7)
Eosinophils Relative: 1.8 % (ref 0.0–5.0)
HEMATOCRIT: 42.4 % (ref 39.0–52.0)
Hemoglobin: 14.5 g/dL (ref 13.0–17.0)
LYMPHS ABS: 2.5 10*3/uL (ref 0.7–4.0)
LYMPHS PCT: 34 % (ref 12.0–46.0)
MCHC: 34.1 g/dL (ref 30.0–36.0)
MCV: 94.1 fl (ref 78.0–100.0)
MONO ABS: 0.9 10*3/uL (ref 0.1–1.0)
Monocytes Relative: 11.6 % (ref 3.0–12.0)
NEUTROS ABS: 3.8 10*3/uL (ref 1.4–7.7)
NEUTROS PCT: 51.3 % (ref 43.0–77.0)
Platelets: 238 10*3/uL (ref 150.0–400.0)
RBC: 4.51 Mil/uL (ref 4.22–5.81)
RDW: 13.9 % (ref 11.5–15.5)
WBC: 7.4 10*3/uL (ref 4.0–10.5)

## 2018-09-05 LAB — COMPREHENSIVE METABOLIC PANEL
ALT: 41 U/L (ref 0–53)
AST: 28 U/L (ref 0–37)
Albumin: 4.5 g/dL (ref 3.5–5.2)
Alkaline Phosphatase: 62 U/L (ref 39–117)
BILIRUBIN TOTAL: 0.4 mg/dL (ref 0.2–1.2)
BUN: 22 mg/dL (ref 6–23)
CO2: 26 mEq/L (ref 19–32)
CREATININE: 0.88 mg/dL (ref 0.40–1.50)
Calcium: 9.5 mg/dL (ref 8.4–10.5)
Chloride: 102 mEq/L (ref 96–112)
GFR: 92.33 mL/min (ref >60.00)
GLUCOSE: 108 mg/dL — AB (ref 70–99)
Potassium: 4.2 mEq/L (ref 3.5–5.1)
SODIUM: 137 meq/L (ref 135–145)
Total Protein: 7.8 g/dL (ref 6.0–8.3)

## 2018-09-05 LAB — TSH: TSH: 3.82 u[IU]/mL (ref 0.35–4.50)

## 2018-09-05 NOTE — Patient Instructions (Signed)
  Tests ordered today. Your results will be released to [facility] (or called to you) after review, usually within 72hours after test completion. If any changes need to be made, you will be notified at that same time.  A CT scan of your neck was ordered - someone will call you to schedule this.   Medications reviewed and updated.  Changes include :   none

## 2018-09-05 NOTE — Progress Notes (Signed)
Subjective:    Patient ID: [name], male    DOB: 07/20/1951, 67 y.o.   MRN: [id number]  HPI The patient is here for an acute visit.  He has had an intermittent sore throat over the summer.  He was seen by his PCP, Dr. Alain Marion on 9/13 and was prescribed a Z-Pak.  There is no significant improvement when he saw him again on 10/9.  He was experiencing swollen neck glands for 3-4 weeks and so the throat.  He also had some nasal congestion.  There is a concern for a possible sinus infection and he was prescribed doxycycline, which she will finish tomorrow.  He had a sore throat for a couple of months 9/13 zpak - no improvement saw him again 10/9 and given Levaquin initially, which she had a side effect to and then was switched to doxycyclline, which he will finish tomorrow.  He has not seen any improvement with the doxycycline.  He continues to have swollen neck glands on both sides and he feels that it is getting worse.  At times he also feels swelling sensation in his throat and the top of his esophagus.  Sometimes he has difficulty swallowing because it feels tight in his upper throat.  He denies any difficulty swallowing liquids or food, just saliva at times.  He does have a sore throat.  He states mild congestion in his nose, but only at night.  He denies any sinus pain, fevers, chills, fatigue, unexpected weight change or changes in his appetite.  He denies headaches and GI symptoms.  He does have a history of Barrett's esophagus.  He is due for a surveillance EGD next spring.  He is taking his PPI daily and feels his GERD is well controlled.  He has had increased burping recently, but no "GERD" symptoms.    He smokes marijuana occasionally. He has had rare cigars 1/year.   Occasional he drinks occasional alcohol.       Medications and allergies reviewed with patient and updated if appropriate.  Patient Active Problem List   Diagnosis Date Noted  . Rash and nonspecific skin  eruption 07/27/2018  . Upper respiratory infection 07/27/2018  . DDD (degenerative disc disease), cervical 07/06/2018  . DDD (degenerative disc disease), lumbar 07/06/2018  . Chondrocalcinosis 07/06/2018  . Hypogonadism in male 04/25/2018  . Tremor 09/15/2017  . Anxiety 08/24/2017  . Nausea 07/18/2017  . Dizzinesses 07/18/2017  . Arthralgia 04/14/2017  . 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 Heart murmur 06/09/2015  . Low back pain 06/09/2015  . Fatigue 06/09/2015  . Hyperglycemia 06/09/2015  . Scrotal mass 06/09/2015  . Bladder neck obstruction 02/03/2015  . OSA (obstructive sleep apnea) 08/21/2013  . Sinusitis, chronic 08/12/2013  . Dyslipidemia 06/21/2013  . 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 Barrett esophagus 07/20/2012  . CAD (coronary artery disease) 07/19/2012  . Ingrowing toenail of left foot 07/06/2012  . Preop exam for internal medicine 06/18/2012  . Well adult exam 08/04/2011  . HAND PAIN 12/24/2010  . NEOPLASM OF UNCERTAIN BEHAVIOR OF SKIN 06/21/2010  . Acute sinusitis 06/21/2010  . ACTINIC KERATOSIS 06/21/2010  . CONTACT DERMATITIS DUE TO SOLVENTS 01/28/2010  . KNEE PAIN 12/21/2009  . Primary osteoarthritis of both hands 11/21/2008  . HERPES, GENITAL NOS 05/09/2008  . ONYCHOMYCOSIS 05/09/2008  . GERD 05/09/2008  . COLONIC POLYPS, HX OF 05/09/2008    Current Outpatient Medications on File Prior to Visit  Medication Sig Dispense Refill  .  Alum Hydroxide-Mag Carbonate (GAVISCON PO) Take 1 tablet by mouth daily as needed (indigestion).    . Ascorbic Acid (VITAMIN C) 1000 MG tablet Take 1,000 mg by mouth daily.    [name] aspirin 81 MG tablet Take 81 mg by mouth at bedtime.     . cholecalciferol (VITAMIN D) 1000 units tablet Take 1,000 Units by mouth daily.    . Clotrimazole (LOTRIMIN AF JOCK ITCH EX) Apply 1 application topically daily as needed (sweat).    . Cyanocobalamin (VITAMIN B-12) 2500 MCG SUBL Place 5,000 mcg under the tongue daily.    [name] doxycycline  (VIBRA-TABS) 100 MG tablet Take 1 tablet (100 mg total) by mouth 2 (two) times daily. 28 tablet 0  . escitalopram (LEXAPRO) 10 MG tablet Take 1 tablet (10 mg total) by mouth daily. (Patient taking differently: Take 5 mg by mouth daily. ) 90 tablet 3  . fluticasone (FLONASE) 50 MCG/ACT nasal spray USE 1 SPRAY(S) IN EACH NOSTRIL ONCE DAILY (Patient taking differently: as needed. ) 48 g 3  . Ginger, Zingiber officinalis, (GINGER ROOT PO) Take by mouth.    . loratadine (CLARITIN) 10 MG tablet Take 1 tablet (10 mg total) by mouth daily. 100 tablet 3  . meloxicam (MOBIC) 7.5 MG tablet TAKE 2 TABLETS BY MOUTH ONCE DAILY 180 tablet 1  . Misc Natural Products (TART CHERRY ADVANCED PO) Take by mouth.    . Multiple Vitamin (MULTIVITAMIN WITH MINERALS) TABS tablet Take 1 tablet by mouth daily.    . Nutritional Supplements (JUICE PLUS FIBRE PO) Take 3 tablets by mouth 2 (two) times daily.     . ondansetron (ZOFRAN) 4 MG tablet TAKE 1 TO 2 TABLETS BY MOUTH EVERY 4 TO 6 HOURS AS NEEDED FOR NAUSEA 30 tablet 3  . pantoprazole (PROTONIX) 40 MG tablet Take 1 tablet (40 mg total) 2 (two) times daily by mouth. 180 tablet 3  . Red Yeast Rice 600 MG TABS Take 1,200 mg by mouth daily.    . traMADol (ULTRAM) 50 MG tablet TAKE 1 TO 2 TABLETS BY MOUTH TWICE DAILY AS NEEDED FOR PAIN 120 tablet 3  . Turmeric 500 MG TABS Take 500 mg by mouth 2 (two) times daily.    . valACYclovir (VALTREX) 500 MG tablet TAKE 1 TABLET BY MOUTH ONCE DAILY 90 tablet 3  . nitroGLYCERIN (NITROSTAT) 0.4 MG SL tablet Place 1 tablet (0.4 mg total) under the tongue every 5 (five) minutes as needed for chest pain. 11 tablet 6   No current facility-administered medications on file prior to visit.     Past Medical History:  Diagnosis Date  . Actinic keratosis   . Colonic polyp   . COLONIC POLYPS, HX OF   . CONTACT DERMATITIS DUE TO SOLVENTS   . DOE (dyspnea on exertion) 07-25-12   related to eating shrimp  . GERD (gastroesophageal reflux disease)    . HAND PAIN   . HERPES, GENITAL NOS   . Ingrowing toenail of left foot 07-25-12   is resolved now 07-25-12  . KNEE PAIN   . Neoplasm of uncertain behavior of skin   . OA (osteoarthritis) of knee   . ONYCHOMYCOSIS   . OSTEOARTHRITIS   . Preop exam for internal medicine   . SINUSITIS, ACUTE   . Well adult exam     Past Surgical History:  Procedure Laterality Date  . JOINT REPLACEMENT  9/13   L TKR  . KNEE ARTHROSCOPY     Left  . LEFT HEART  CATH AND CORONARY ANGIOGRAPHY N/A 08/31/2017   Procedure: LEFT HEART CATH AND CORONARY ANGIOGRAPHY;  Surgeon: Nelva Bush, MD;  Location: [facility];  Service: Cardiovascular;  Laterality: N/A;  . TOTAL KNEE ARTHROPLASTY  07/31/2012   Procedure: TOTAL KNEE ARTHROPLASTY;  Surgeon: Sydnee Cabal, MD;  Location: [facility];  Service: Orthopedics;  Laterality: Left;  . UMBILICAL HERNIA REPAIR  07-25-12   10 yrs ago    Social History   Socioeconomic History  . Marital status: Married    Spouse name: Not on file  . Number of children: Not on file  . Years of education: Not on file  . Highest education level: Not on file  Occupational History  . Not on file  Social Needs  . Financial resource strain: Somewhat hard  . Food insecurity:    Worry: Sometimes true    Inability: Sometimes true  . Transportation needs:    Medical: No    Non-medical: No  Tobacco Use  . Smoking status: Former Smoker    Types: Cigars    Last attempt to quit: 08/30/2007    Years since quitting: 11.0  . Smokeless tobacco: Never Used  . Tobacco comment: occ. cigar  Substance and Sexual Activity  . Alcohol use: Yes    Comment: occ.   . Drug use: Yes    Types: Marijuana    Comment: Marijuana occ  . Sexual activity: Yes    Partners: Female    Birth control/protection: None  Lifestyle  . Physical activity:    Days per week: 5 days    Minutes per session: 50 min  . Stress: To some extent  Relationships  . Social connections:    Talks on phone: More  than three times a week    Gets together: More than three times a week    Attends religious service: Not on file    Active member of club or organization: Not on file    Attends meetings of clubs or organizations: Not on file    Relationship status: Married  Other Topics Concern  . Not on file  Social History Narrative  . Not on file    Family History  Problem Relation Age of Onset  . Diabetes Mother 61  . Heart disease Father 33       CAD, MI, CHF  . Lung cancer Brother        smoker  . Coronary artery disease Other   . Colon polyps Brother   . Heart disease Brother 65       CHF  . Healthy Daughter   . Colon cancer Neg Hx   . Stomach cancer Neg Hx     Review of Systems  Constitutional: Negative for appetite change, chills, fatigue, fever and unexpected weight change.  HENT: Positive for congestion (nasal congestion at night only), sore throat and trouble swallowing. Negative for ear pain, sinus pressure and sinus pain.        Swollen neck glands, jaw pain  Respiratory: Negative for cough, shortness of breath and wheezing.   Cardiovascular: Negative for chest pain and palpitations.  Gastrointestinal: Negative for abdominal pain, blood in stool (no black stool), constipation, diarrhea and nausea.       GERD controlled  Neurological: Negative for dizziness, light-headedness and headaches.       Objective:   Vitals:   09/05/18 1413  BP: 136/82  Pulse: 82  Resp: 16  Temp: 98.2 F (36.8 C)  SpO2: 96%   BP  Readings from Last 3 Encounters:  09/05/18 136/82  08/22/18 128/76  07/27/18 140/78   Wt Readings from Last 3 Encounters:  09/05/18 233 lb 12.8 oz (106.1 kg)  08/22/18 226 lb (102.5 kg)  07/27/18 232 lb (105.2 kg)   Body mass index is 33.1 kg/m.   Physical Exam  Constitutional: He appears well-developed and well-nourished. No distress.  HENT:  Head: Normocephalic and atraumatic.  Right Ear: External ear normal.  Left Ear: External ear normal.  Nose:  Nose normal.  Mouth/Throat: Oropharynx is clear and moist. No oropharyngeal exudate.  Bilateral ear canals and tympanic membranes normal  Eyes: Conjunctivae are normal.  Neck: Neck supple. No tracheal deviation present. No thyromegaly present.  ?  Mild submandibular lymphadenopathy, no occipital, auricular or supraclavicular lymphadenopathy  Cardiovascular: Normal rate and regular rhythm.  Pulmonary/Chest: Effort normal and breath sounds normal. No stridor. No respiratory distress. He has no wheezes. He has no rales.  Musculoskeletal: He exhibits no edema.  Lymphadenopathy:    He has cervical adenopathy (Bilateral anterior and some posterior cervical adenopathy that is tender).  Skin: Skin is warm and dry. He is not diaphoretic.           Assessment & Plan:    See Problem List for Assessment and Plan of chronic medical problems.

## 2018-09-05 NOTE — Assessment & Plan Note (Signed)
Having a few weeks of bilateral swollen and tender anterior and posterior cervical lymphadenopathy associated with an intermittent sore throat and a feeling of difficulty swallowing, but able to swallow liquids and food without difficulty No obvious infection Symptoms have not responded to doxycycline and have continued to get worse Concern for the possibility of cancer-head and neck or possibly esophageal Check CBC, CMP, TSH CT scan soft tissues of neck Depending on above results we will either need to see ENT or possibly GI Completes doxycycline tomorrow No new treatment at this time

## 2018-09-06 ENCOUNTER — Ambulatory Visit: Payer: Medicare HMO | Admitting: Internal Medicine

## 2018-09-07 ENCOUNTER — Ambulatory Visit (INDEPENDENT_AMBULATORY_CARE_PROVIDER_SITE_OTHER)
Admission: RE | Admit: 2018-09-07 | Discharge: 2018-09-07 | Disposition: A | Discharge status: Home / Self Care | Payer: Medicare HMO | Source: Ambulatory Visit | Attending: Internal Medicine | Admitting: Internal Medicine

## 2018-09-07 DIAGNOSIS — R221 Localized swelling, mass and lump, neck: Secondary | ICD-10-CM | POA: Diagnosis not present

## 2018-09-07 DIAGNOSIS — R220 Localized swelling, mass and lump, head: Secondary | ICD-10-CM | POA: Diagnosis not present

## 2018-09-07 DIAGNOSIS — R6884 Jaw pain: Secondary | ICD-10-CM | POA: Diagnosis not present

## 2018-09-07 MED ORDER — IOPAMIDOL (ISOVUE-300) INJECTION 61%
80.0000 mL | Freq: Once | INTRAVENOUS | Status: AC | PRN
  Administered 2018-09-07: 75 mL via INTRAVENOUS

## 2018-09-08 ENCOUNTER — Encounter: Payer: Self-pay | Admitting: Internal Medicine

## 2018-09-12 ENCOUNTER — Other Ambulatory Visit: Payer: Self-pay | Admitting: Internal Medicine

## 2018-09-26 ENCOUNTER — Other Ambulatory Visit: Payer: Self-pay | Admitting: Internal Medicine

## 2018-09-26 DIAGNOSIS — J312 Chronic pharyngitis: Secondary | ICD-10-CM

## 2018-10-02 ENCOUNTER — Other Ambulatory Visit: Payer: Self-pay | Admitting: Internal Medicine

## 2018-10-02 DIAGNOSIS — K219 Gastro-esophageal reflux disease without esophagitis: Secondary | ICD-10-CM

## 2018-10-02 HISTORY — DX: Gastro-esophageal reflux disease without esophagitis: K21.9

## 2018-10-09 ENCOUNTER — Other Ambulatory Visit: Payer: Self-pay | Admitting: Internal Medicine

## 2018-11-01 ENCOUNTER — Encounter: Payer: Self-pay | Admitting: Internal Medicine

## 2018-11-01 ENCOUNTER — Other Ambulatory Visit: Payer: Medicare HMO

## 2018-11-01 ENCOUNTER — Ambulatory Visit (INDEPENDENT_AMBULATORY_CARE_PROVIDER_SITE_OTHER): Payer: Medicare HMO | Admitting: Internal Medicine

## 2018-11-01 VITALS — BP 130/74 | HR 74 | Temp 98.3°F | Ht 70.47 in | Wt 235.0 lb

## 2018-11-01 DIAGNOSIS — R21 Rash and other nonspecific skin eruption: Secondary | ICD-10-CM | POA: Diagnosis not present

## 2018-11-01 DIAGNOSIS — F419 Anxiety disorder, unspecified: Secondary | ICD-10-CM | POA: Diagnosis not present

## 2018-11-01 DIAGNOSIS — E785 Hyperlipidemia, unspecified: Secondary | ICD-10-CM

## 2018-11-01 DIAGNOSIS — Z23 Encounter for immunization: Secondary | ICD-10-CM | POA: Diagnosis not present

## 2018-11-01 DIAGNOSIS — M2550 Pain in unspecified joint: Secondary | ICD-10-CM

## 2018-11-01 DIAGNOSIS — K22719 Barrett's esophagus with dysplasia, unspecified: Secondary | ICD-10-CM | POA: Diagnosis not present

## 2018-11-01 DIAGNOSIS — I2510 Atherosclerotic heart disease of native coronary artery without angina pectoris: Secondary | ICD-10-CM | POA: Diagnosis not present

## 2018-11-01 MED ORDER — METHYLPREDNISOLONE ACETATE 80 MG/ML IJ SUSP
80.0000 mg | Freq: Once | INTRAMUSCULAR | Status: AC
  Administered 2018-11-01: 80 mg via INTRAMUSCULAR

## 2018-11-01 MED ORDER — TRAMADOL HCL 50 MG PO TABS: 50.0000 mg | ORAL_TABLET | Freq: Three times a day (TID) | ORAL | 2 refills | Status: DC | PRN

## 2018-11-01 NOTE — Assessment & Plan Note (Signed)
Protonix.  ?

## 2018-11-01 NOTE — Assessment & Plan Note (Signed)
[facility] Depo-medrol IM Increase Tramadol w/caution  Potential benefits of a long term opioids use as well as potential risks (i.e. addiction risk, apnea etc) and complications (i.e. Somnolence, constipation and others) were explained to the patient and were aknowledged.

## 2018-11-01 NOTE — Assessment & Plan Note (Signed)
Pravastatin - pt stopped due to side effects On ASA 10/18 Dr Geraldo Pitter - heart cath w/CAD

## 2018-11-01 NOTE — Assessment & Plan Note (Signed)
Urticaria Depo-medrol IM

## 2018-11-01 NOTE — Assessment & Plan Note (Signed)
Pravastatin - pt stopped due to side effects

## 2018-11-01 NOTE — Progress Notes (Signed)
Subjective:  Patient ID: [name], male    DOB: October 27, 1951  Age: 67 y.o. MRN: [id number]  CC: No chief complaint on file.   HPI [name] presents for pains all over 5-8/10 on Tramadol 2 tabs bid - not better; worse. C/o cramps, numbness. He saw a rheumatologist - Dr Estanislado Pandy C/o rashes - hives  Outpatient Medications Prior to Visit  Medication Sig Dispense Refill  . Alum Hydroxide-Mag Carbonate (GAVISCON PO) Take 1 tablet by mouth daily as needed (indigestion).    . Ascorbic Acid (VITAMIN C) 1000 MG tablet Take 1,000 mg by mouth daily.    [name] aspirin 81 MG tablet Take 81 mg by mouth at bedtime.     . cholecalciferol (VITAMIN D) 1000 units tablet Take 1,000 Units by mouth daily.    . Clotrimazole (LOTRIMIN AF JOCK ITCH EX) Apply 1 application topically daily as needed (sweat).    . Cyanocobalamin (VITAMIN B-12) 2500 MCG SUBL Place 5,000 mcg under the tongue daily.    [name] doxycycline (VIBRA-TABS) 100 MG tablet Take 1 tablet (100 mg total) by mouth 2 (two) times daily. 28 tablet 0  . escitalopram (LEXAPRO) 10 MG tablet Take 1 tablet (10 mg total) by mouth daily. (Patient taking differently: Take 5 mg by mouth daily. ) 90 tablet 3  . fluticasone (FLONASE) 50 MCG/ACT nasal spray USE 1 SPRAY(S) IN EACH NOSTRIL ONCE DAILY (Patient taking differently: as needed. ) 48 g 3  . Ginger, Zingiber officinalis, (GINGER ROOT PO) Take by mouth.    . loratadine (CLARITIN) 10 MG tablet Take 1 tablet (10 mg total) by mouth daily. 100 tablet 3  . meloxicam (MOBIC) 7.5 MG tablet TAKE 2 TABLETS BY MOUTH ONCE DAILY 180 tablet 0  . Misc Natural Products (TART CHERRY ADVANCED PO) Take by mouth.    . Multiple Vitamin (MULTIVITAMIN WITH MINERALS) TABS tablet Take 1 tablet by mouth daily.    . Nutritional Supplements (JUICE PLUS FIBRE PO) Take 3 tablets by mouth 2 (two) times daily.     . ondansetron (ZOFRAN) 4 MG tablet TAKE 1 TO 2 TABLETS BY MOUTH EVERY 4 TO 6 HOURS AS NEEDED FOR NAUSEA 30 tablet 3  .  pantoprazole (PROTONIX) 40 MG tablet TAKE 1 TABLET BY MOUTH TWICE DAILY 180 tablet 3  . Red Yeast Rice 600 MG TABS Take 1,200 mg by mouth daily.    . traMADol (ULTRAM) 50 MG tablet TAKE 1 TO 2 TABLETS BY MOUTH TWICE DAILY AS NEEDED FOR PAIN 120 tablet 2  . triamcinolone ointment (KENALOG) 0.5 % APPLY OINTMENT TOPICALLY TWICE DAILY 45 g 2  . Turmeric 500 MG TABS Take 500 mg by mouth 2 (two) times daily.    . valACYclovir (VALTREX) 500 MG tablet TAKE 1 TABLET BY MOUTH ONCE DAILY 90 tablet 3  . nitroGLYCERIN (NITROSTAT) 0.4 MG SL tablet Place 1 tablet (0.4 mg total) under the tongue every 5 (five) minutes as needed for chest pain. 11 tablet 6   No facility-administered medications prior to visit.     ROS: Review of Systems  Constitutional: Positive for fatigue. Negative for appetite change and unexpected weight change.  HENT: Negative for congestion, nosebleeds, sneezing, sore throat and trouble swallowing.   Eyes: Negative for itching and visual disturbance.  Respiratory: Negative for cough.   Cardiovascular: Negative for chest pain, palpitations and leg swelling.  Gastrointestinal: Negative for abdominal distention, blood in stool, diarrhea and nausea.  Genitourinary: Negative for frequency and hematuria.  Musculoskeletal: Positive  for arthralgias, back pain, joint swelling, myalgias and neck stiffness. Negative for gait problem and neck pain.  Skin: Negative for rash.  Neurological: Positive for numbness. Negative for dizziness, tremors, speech difficulty and weakness.  Psychiatric/Behavioral: Positive for decreased concentration and dysphoric mood. Negative for agitation, sleep disturbance and suicidal ideas. The patient is nervous/anxious.     Objective:  BP 130/74 (BP Location: Left Arm, Patient Position: Sitting, Cuff Size: Large)   Pulse 74   Temp 98.3 F (36.8 C) (Oral)   Ht 5' 10.47" (1.79 m)   Wt 235 lb (106.6 kg)   SpO2 96%   BMI 33.27 kg/m   BP Readings from Last 3  Encounters:  11/01/18 130/74  09/05/18 136/82  08/22/18 128/76    Wt Readings from Last 3 Encounters:  11/01/18 235 lb (106.6 kg)  09/05/18 233 lb 12.8 oz (106.1 kg)  08/22/18 226 lb (102.5 kg)    Physical Exam Constitutional:      General: He is not in acute distress.    Appearance: He is well-developed.     Comments: NAD  Eyes:     Conjunctiva/sclera: Conjunctivae normal.     Pupils: Pupils are equal, round, and reactive to light.  Neck:     Musculoskeletal: Normal range of motion.     Thyroid: No thyromegaly.     Vascular: No JVD.  Cardiovascular:     Rate and Rhythm: Normal rate and regular rhythm.     Heart sounds: Normal heart sounds. No murmur. No friction rub. No gallop.   Pulmonary:     Effort: Pulmonary effort is normal. No respiratory distress.     Breath sounds: Normal breath sounds. No wheezing or rales.  Chest:     Chest wall: No tenderness.  Abdominal:     General: Bowel sounds are normal. There is no distension.     Palpations: Abdomen is soft. There is no mass.     Tenderness: There is no abdominal tenderness. There is no guarding or rebound.  Musculoskeletal: Normal range of motion.        General: No tenderness.  Lymphadenopathy:     Cervical: No cervical adenopathy.  Skin:    General: Skin is warm and dry.     Findings: No rash.  Neurological:     Mental Status: He is alert and oriented to person, place, and time.     Cranial Nerves: No cranial nerve deficit.     Motor: No abnormal muscle tone.     Coordination: Coordination normal.     Gait: Gait normal.     Deep Tendon Reflexes: Reflexes are normal and symmetric.  Psychiatric:        Behavior: Behavior normal.        Thought Content: Thought content normal.        Judgment: Judgment normal.     Lab Results  Component Value Date   WBC 7.4 09/05/2018   HGB 14.5 09/05/2018   HCT 42.4 09/05/2018   PLT 238.0 09/05/2018   GLUCOSE 108 (H) 09/05/2018   CHOL 235 (H) 04/25/2018   TRIG 255.0  (H) 04/25/2018   HDL 37.90 (L) 04/25/2018   LDLDIRECT 151.0 04/25/2018   LDLCALC 154 (H) 07/12/2017   ALT 41 09/05/2018   AST 28 09/05/2018   NA 137 09/05/2018   K 4.2 09/05/2018   CL 102 09/05/2018   CREATININE 0.88 09/05/2018   BUN 22 09/05/2018   CO2 26 09/05/2018   TSH 3.82 09/05/2018   PSA  1.16 04/25/2018   INR 1.0 08/29/2017   HGBA1C 5.7 04/25/2018    Ct Soft Tissue Neck W Contrast  Result Date: 09/07/2018 CLINICAL DATA:  Submandibular swelling.  Occasional jaw pain. EXAM: CT NECK WITH CONTRAST TECHNIQUE: Multidetector CT imaging of the neck was performed using the standard protocol following the bolus administration of intravenous contrast. CONTRAST:  44mL ISOVUE-300 IOPAMIDOL (ISOVUE-300) INJECTION 61% COMPARISON:  None. FINDINGS: Pharynx and larynx: No mucosal or submucosal lesion. Salivary glands: Parotid and submandibular glands are normal. No mass, inflammatory disease or stone disease. Thyroid: Normal Lymph nodes: No enlarged or low-density nodes on either side of the neck. No asymmetric nodes. Skin marker in the region of concern overlies the right submandibular region, but there is no regional pathologic finding by CT. Vascular: Carotid bifurcation atherosclerosis. No other vascular finding. Limited intracranial: Normal Visualized orbits: Normal Mastoids and visualized paranasal sinuses: Clear Skeleton: Ordinary cervical spondylosis. Upper chest: Negative Other: None IMPRESSION: No pathologic finding by CT. Skin marker overlying the area of concern is present overlying the right submandibular gland. The submandibular gland itself appears normal in there is no evidence of regional adenopathy or other soft tissue finding. Electronically Signed   By: Nelson Chimes M.D.   On: 09/07/2018 13:03    Assessment & Plan:   There are no diagnoses linked to this encounter.   No orders of the defined types were placed in this encounter.    Follow-up: No follow-ups on file.  Walker Kehr, MD

## 2018-11-01 NOTE — Assessment & Plan Note (Signed)
Lexapro 

## 2018-11-01 NOTE — Addendum Note (Signed)
Addended by: Karren Cobble on: 11/01/2018 10:47 AM   Modules accepted: Orders

## 2018-11-01 NOTE — Addendum Note (Signed)
Addended by: Karren Cobble on: 11/01/2018 11:29 AM   Modules accepted: Orders

## 2018-11-07 ENCOUNTER — Other Ambulatory Visit: Payer: Self-pay | Admitting: Internal Medicine

## 2018-11-07 DIAGNOSIS — R21 Rash and other nonspecific skin eruption: Secondary | ICD-10-CM

## 2018-11-12 ENCOUNTER — Other Ambulatory Visit (INDEPENDENT_AMBULATORY_CARE_PROVIDER_SITE_OTHER): Payer: Medicare HMO

## 2018-11-12 DIAGNOSIS — M2550 Pain in unspecified joint: Secondary | ICD-10-CM | POA: Diagnosis not present

## 2018-11-12 DIAGNOSIS — R21 Rash and other nonspecific skin eruption: Secondary | ICD-10-CM

## 2018-11-12 LAB — BASIC METABOLIC PANEL
BUN: 24 mg/dL — ABNORMAL HIGH (ref 6–23)
CALCIUM: 9.5 mg/dL (ref 8.4–10.5)
CO2: 24 mEq/L (ref 19–32)
Chloride: 101 mEq/L (ref 96–112)
Creatinine, Ser: 0.83 mg/dL (ref 0.40–1.50)
GFR: 98.72 mL/min (ref >60.00)
GLUCOSE: 107 mg/dL — AB (ref 70–99)
POTASSIUM: 4.3 meq/L (ref 3.5–5.1)
SODIUM: 135 meq/L (ref 135–145)

## 2018-11-12 LAB — SEDIMENTATION RATE: SED RATE: 25 mm/h — AB (ref 0–20)

## 2018-11-15 LAB — HEAVY METALS PANEL, BLOOD: Lead: 1 ug/dL (ref <5)

## 2018-11-15 LAB — PROTEIN ELECTROPHORESIS, SERUM
Albumin ELP: 4.5 g/dL (ref 3.8–4.8)
Alpha 1: 0.2 g/dL (ref 0.2–0.3)
Alpha 2: 0.8 g/dL (ref 0.5–0.9)
BETA 2: 0.6 g/dL — AB (ref 0.2–0.5)
BETA GLOBULIN: 0.5 g/dL (ref 0.4–0.6)
GAMMA GLOBULIN: 1.2 g/dL (ref 0.8–1.7)
Total Protein: 7.7 g/dL (ref 6.1–8.1)

## 2018-11-19 DIAGNOSIS — L738 Other specified follicular disorders: Secondary | ICD-10-CM | POA: Diagnosis not present

## 2018-11-19 DIAGNOSIS — L739 Follicular disorder, unspecified: Secondary | ICD-10-CM

## 2018-11-19 DIAGNOSIS — L718 Other rosacea: Secondary | ICD-10-CM | POA: Diagnosis not present

## 2018-11-19 DIAGNOSIS — L570 Actinic keratosis: Secondary | ICD-10-CM | POA: Diagnosis not present

## 2018-11-19 DIAGNOSIS — L308 Other specified dermatitis: Secondary | ICD-10-CM | POA: Diagnosis not present

## 2018-11-19 DIAGNOSIS — L719 Rosacea, unspecified: Secondary | ICD-10-CM

## 2018-11-19 HISTORY — DX: Follicular disorder, unspecified: L73.9

## 2018-11-19 HISTORY — DX: Rosacea, unspecified: L71.9

## 2018-12-13 DIAGNOSIS — H9042 Sensorineural hearing loss, unilateral, left ear, with unrestricted hearing on the contralateral side: Secondary | ICD-10-CM | POA: Diagnosis not present

## 2018-12-13 DIAGNOSIS — H9319 Tinnitus, unspecified ear: Secondary | ICD-10-CM

## 2018-12-13 DIAGNOSIS — H9313 Tinnitus, bilateral: Secondary | ICD-10-CM | POA: Insufficient documentation

## 2018-12-13 HISTORY — DX: Tinnitus, unspecified ear: H93.19

## 2018-12-13 HISTORY — DX: Tinnitus, bilateral: H93.13

## 2018-12-17 ENCOUNTER — Other Ambulatory Visit: Payer: Self-pay | Admitting: Internal Medicine

## 2018-12-23 ENCOUNTER — Other Ambulatory Visit: Payer: Self-pay | Admitting: Internal Medicine

## 2018-12-25 ENCOUNTER — Other Ambulatory Visit: Payer: Self-pay | Admitting: Otolaryngology

## 2018-12-25 DIAGNOSIS — H9319 Tinnitus, unspecified ear: Secondary | ICD-10-CM

## 2019-01-10 ENCOUNTER — Other Ambulatory Visit: Payer: Self-pay | Admitting: Internal Medicine

## 2019-01-15 ENCOUNTER — Encounter: Payer: Self-pay | Admitting: Internal Medicine

## 2019-01-22 ENCOUNTER — Encounter: Payer: Self-pay | Admitting: Internal Medicine

## 2019-01-22 NOTE — Telephone Encounter (Signed)
Pt is calling back to schedule awv. He would like to have the same day as his appt wit Dr Alain Marion on 03/19 Phone is [phone]

## 2019-01-28 ENCOUNTER — Other Ambulatory Visit: Payer: Self-pay | Admitting: Internal Medicine

## 2019-01-31 ENCOUNTER — Ambulatory Visit: Payer: Medicare HMO | Admitting: Internal Medicine

## 2019-01-31 ENCOUNTER — Ambulatory Visit: Payer: Medicare HMO

## 2019-02-07 ENCOUNTER — Telehealth: Payer: Self-pay

## 2019-02-07 NOTE — Telephone Encounter (Signed)
Patient was called to reschedule his endo/colon procedure due to Covid 19. Procedures were changed to 03/13/19 @ 10:30 Am. Patient agreed to have a phone PV on 02/12/19 @ 9:00 Am.

## 2019-02-11 ENCOUNTER — Telehealth: Payer: Self-pay | Admitting: *Deleted

## 2019-02-11 NOTE — Telephone Encounter (Signed)
Called patient to inform them the nurse needs to either convert their upcoming AWV to a virtual visit or reschedule the visit out into the future due to covid-19 safety measures. Patient asked that his PCP be rescheduled as well.  Appointments rescheduled for 04/15/19.

## 2019-02-12 ENCOUNTER — Other Ambulatory Visit: Payer: Self-pay

## 2019-02-12 ENCOUNTER — Ambulatory Visit (AMBULATORY_SURGERY_CENTER): Payer: Self-pay

## 2019-02-12 VITALS — Ht 71.0 in | Wt 230.0 lb

## 2019-02-12 DIAGNOSIS — Z86010 Personal history of colonic polyps: Secondary | ICD-10-CM

## 2019-02-12 DIAGNOSIS — K2270 Barrett's esophagus without dysplasia: Secondary | ICD-10-CM

## 2019-02-12 MED ORDER — NA SULFATE-K SULFATE-MG SULF 17.5-3.13-1.6 GM/177ML PO SOLN: 1.0000 | Freq: Once | ORAL | 0 refills | Status: AC

## 2019-02-12 NOTE — Progress Notes (Signed)
Denies allergies to eggs or soy products. Denies complication of anesthesia or sedation. Denies use of weight loss medication. Denies use of O2.   Emmi instructions declined.   Pre-Visit was conducted by phone. Insurance was verified. All instructions were mailed to the patient. Patient was encouraged to call with any questions or concerns.   Patient was given a 2 day Suprep Prep due to poor prep in 2016.

## 2019-02-26 ENCOUNTER — Encounter: Payer: Medicare HMO | Admitting: Internal Medicine

## 2019-03-01 ENCOUNTER — Ambulatory Visit: Payer: Medicare HMO

## 2019-03-04 ENCOUNTER — Ambulatory Visit: Payer: Medicare HMO | Admitting: Internal Medicine

## 2019-03-13 ENCOUNTER — Encounter: Payer: Medicare HMO | Admitting: Internal Medicine

## 2019-03-19 ENCOUNTER — Telehealth: Payer: Self-pay | Admitting: *Deleted

## 2019-03-19 NOTE — Telephone Encounter (Signed)
LM on VM for patient to call back to reschedule postponed procedure.

## 2019-03-25 ENCOUNTER — Other Ambulatory Visit: Payer: Self-pay | Admitting: Internal Medicine

## 2019-03-27 NOTE — Telephone Encounter (Signed)
LMOM to schedule ECL

## 2019-04-01 NOTE — Telephone Encounter (Signed)
Pt called back to schedule appointment for 6/10.

## 2019-04-03 ENCOUNTER — Other Ambulatory Visit: Payer: Self-pay | Admitting: Internal Medicine

## 2019-04-03 NOTE — Telephone Encounter (Signed)
[place] Controlled Database Checked Last filled: 03/01/19 # 180 LOV w/you: 11/01/18 Next appt w/you: 04/15/19

## 2019-04-10 ENCOUNTER — Ambulatory Visit: Payer: Medicare HMO | Admitting: *Deleted

## 2019-04-10 ENCOUNTER — Other Ambulatory Visit: Payer: Self-pay

## 2019-04-10 ENCOUNTER — Encounter: Payer: Self-pay | Admitting: Internal Medicine

## 2019-04-10 VITALS — Ht 71.75 in | Wt 240.0 lb

## 2019-04-10 DIAGNOSIS — K2270 Barrett's esophagus without dysplasia: Secondary | ICD-10-CM

## 2019-04-10 DIAGNOSIS — Z800 Family history of malignant neoplasm of digestive organs: Secondary | ICD-10-CM

## 2019-04-10 NOTE — Progress Notes (Signed)
No egg or soy allergy known to patient  No issues with past sedation with any surgeries  or procedures, no intubation problems  No diet pills per patient No home 02 use per patient  No blood thinners per patient  Pt denies issues with constipation  No A fib or A flutter  EMMI video sent to pt's e mail   2 day prep per RAS Dr Henrene Pastor pt aware   Pt has a Suprep kit at home already   Instructions sent thru My chart   Pt verified name, DOB, address and insurance during PV today. Pt mailed instruction packet to included paper to complete and mail back to [facility] with addressed and stamped envelope, Emmi video, copy of consent form to read and not return, and instructions. PV completed over the phone. Pt encouraged to call with questions or issues

## 2019-04-11 ENCOUNTER — Other Ambulatory Visit: Payer: Self-pay | Admitting: Internal Medicine

## 2019-04-12 NOTE — Progress Notes (Signed)
Subjective:   [name] is a 68 y.o. male who presents for Medicare Annual/Subsequent preventive examination.  I connected with patient 04/15/19 at  4:00 PM EDT by a video/audio enabled telemedicine application and verified that I am speaking with the correct person using two identifiers. Patient stated full name and DOB. Patient gave permission to continue with virtual visit. Patient's location was at home and Nurse's location was at [place] office.  Review of Systems:   Cardiac Risk Factors include: advanced age (>57mn, >>18women);dyslipidemia;male gender;hypertension Sleep patterns: has frequent nighttime awakenings, gets up 1-2 times nightly to void and sleeps 5-6 hours nightly.    Home Safety/Smoke Alarms: Feels safe in home. Smoke alarms in place.  Living environment; residence and Firearm Safety: 1-story house/ trailer, no firearms, firearms stored safely. Lives with wife, no needs for DME, good support system Seat Belt Safety/Bike Helmet: Wears seat belt.   PSA-  Lab Results  Component Value Date   PSA 1.16 04/25/2018   PSA 0.84 07/12/2017   PSA 0.72 06/16/2016       Objective:    Vitals: There were no vitals taken for this visit.  There is no height or weight on file to calculate BMI.  Advanced Directives 04/15/2019 12/25/2017 08/31/2017 08/21/2017 09/26/2016 11/12/2015 07/31/2012  Does Patient Have a Medical Advance Directive? _0  No Patient does not have advance directive  Would patient like information on creating a medical advance directive? Yes (ED - Information included in AVS) Yes (ED - Information included in AVS) No - Patient declined No - Patient declined No - patient declined information - -  Pre-existing out of facility DNR order (yellow form or pink MOST form) - - - - - - No    Tobacco Social History   Tobacco Use  Smoking Status Former Smoker  . Types: Cigars  . Last attempt to quit: 08/30/2007  . Years since quitting: 11.6  Smokeless  Tobacco Never Used  Tobacco Comment   occ. cigar     Counseling given: Not Answered Comment: occ. cigar  Past Medical History:  Diagnosis Date  . Actinic keratosis   . Allergy   . Colonic polyp   . COLONIC POLYPS, HX OF   . CONTACT DERMATITIS DUE TO SOLVENTS   . DOE (dyspnea on exertion) 07-25-12   related to eating shrimp  . GERD (gastroesophageal reflux disease)   . HAND PAIN   . Heart murmur   . HERPES, GENITAL NOS   . Hyperlipidemia   . Ingrowing toenail of left foot 07-25-12   is resolved now 07-25-12  . KNEE PAIN   . Neoplasm of uncertain behavior of skin   . OA (osteoarthritis) of knee   . ONYCHOMYCOSIS   . OSTEOARTHRITIS   . Preop exam for internal medicine   . SINUSITIS, ACUTE   . Well adult exam    Past Surgical History:  Procedure Laterality Date  . COLONOSCOPY    . JOINT REPLACEMENT  9/13   L TKR  . KNEE ARTHROSCOPY     Left  . LEFT HEART CATH AND CORONARY ANGIOGRAPHY N/A 08/31/2017   Procedure: LEFT HEART CATH AND CORONARY ANGIOGRAPHY;  Surgeon: ENelva Bush MD;  [facility];  Service: Cardiovascular;  Laterality: N/A;  . TOTAL KNEE ARTHROPLASTY  07/31/2012   Procedure: TOTAL KNEE ARTHROPLASTY;  Surgeon: RSydnee Cabal MD;  Location: [facility];  Service: Orthopedics;  Laterality: Left;  . UMBILICAL HERNIA REPAIR  07-25-12  10 yrs ago  . UPPER GASTROINTESTINAL ENDOSCOPY     Family History  Problem Relation Age of Onset  . Diabetes Mother 73  . Heart disease Father 24       CAD, MI, CHF  . Lung cancer Brother        smoker  . Coronary artery disease Other   . Colon polyps Brother   . Heart disease Brother 65       CHF  . Colon cancer Brother   . Healthy Daughter   . Stomach cancer Neg Hx   . Rectal cancer Neg Hx   . Esophageal cancer Neg Hx    Social History   Socioeconomic History  . Marital status: Married    Spouse name: Not on file  . Number of children: 2  . Years of education: Not on file  . Highest education  level: Not on file  Occupational History  . Occupation: retired  Scientific laboratory technician  . Financial resource strain: Not very hard  . Food insecurity:    Worry: Never true    Inability: Never true  . Transportation needs:    Medical: No    Non-medical: No  Tobacco Use  . Smoking status: Former Smoker    Types: Cigars    Last attempt to quit: 08/30/2007    Years since quitting: 11.6  . Smokeless tobacco: Never Used  . Tobacco comment: occ. cigar  Substance and Sexual Activity  . Alcohol use: Yes    Comment: occ.   . Drug use: Yes    Types: Marijuana    Comment: Marijuana occ  . Sexual activity: Yes    Partners: Female    Birth control/protection: None  Lifestyle  . Physical activity:    Days per week: 5 days    Minutes per session: 40 min  . Stress: Only a little  Relationships  . Social connections:    Talks on phone: More than three times a week    Gets together: More than three times a week    Attends religious service: Not on file    Active member of club or organization: Not on file    Attends meetings of clubs or organizations: Not on file    Relationship status: Married  Other Topics Concern  . Not on file  Social History Narrative  . Not on file    Outpatient Encounter Medications as of 04/15/2019  Medication Sig  . Alum Hydroxide-Mag Carbonate (GAVISCON PO) Take 1 tablet by mouth daily as needed (indigestion).  . Ascorbic Acid (VITAMIN C) 1000 MG tablet Take 1,000 mg by mouth daily.  [name] aspirin 81 MG tablet Take 81 mg by mouth at bedtime.   . bisacodyl (DULCOLAX) 5 MG EC tablet Take 5 mg by mouth once. X 4 for a 2 day suprep  . cholecalciferol (VITAMIN D) 1000 units tablet Take 1,000 Units by mouth daily.  . Clotrimazole (LOTRIMIN AF JOCK ITCH EX) Apply 1 application topically daily as needed (sweat).  . Cyanocobalamin (VITAMIN B-12) 2500 MCG SUBL Place 5,000 mcg under the tongue daily.  [name] escitalopram (LEXAPRO) 10 MG tablet Take 1 tablet by mouth once daily  .  fluticasone (FLONASE) 50 MCG/ACT nasal spray Use 1 spray(s) in each nostril once daily  . gabapentin (NEURONTIN) 100 MG capsule Take 1-2 capsules (100-200 mg total) by mouth 3 (three) times daily as needed (pain or ringing in the ears).  . Ginger, Zingiber officinalis, (GINGER ROOT PO) Take by mouth.  [name]  loratadine (CLARITIN) 10 MG tablet Take 1 tablet (10 mg total) by mouth daily.  . meloxicam (MOBIC) 7.5 MG tablet Take 2 tablets by mouth once daily  . Misc Natural Products (TART CHERRY ADVANCED PO) Take by mouth.  . Multiple Vitamin (MULTIVITAMIN WITH MINERALS) TABS tablet Take 1 tablet by mouth daily.  . nitroGLYCERIN (NITROSTAT) 0.4 MG SL tablet Place 1 tablet (0.4 mg total) under the tongue every 5 (five) minutes as needed for chest pain.  [name] ondansetron (ZOFRAN) 4 MG tablet TAKE 1 TO 2 TABLETS BY MOUTH EVERY 4 TO 6 HOURS AS NEEDED FOR NAUSEA (Patient not taking: Reported on 04/10/2019)  . OVER THE COUNTER MEDICATION Flax Seed Oil, One capsule daily.  [name] OVER THE COUNTER MEDICATION Fish Oil , one capsule daily.  . pantoprazole (PROTONIX) 40 MG tablet TAKE 1 TABLET BY MOUTH TWICE DAILY  . polyethylene glycol powder (GLYCOLAX/MIRALAX) powder Take 1 Container by mouth once.  . Red Yeast Rice 600 MG TABS Take 1,200 mg by mouth daily.  . SUPREP BOWEL PREP KIT 17.5-3.13-1.6 GM/177ML SOLN TAKE AS DIRECTED ONCE FOR ONE DOSE  . traMADol (ULTRAM) 50 MG tablet TAKE 1 TO 2 TABLETS BY MOUTH THREE TIMES DAILY AS NEEDED  . triamcinolone ointment (KENALOG) 0.5 % APPLY OINTMENT TOPICALLY TWICE DAILY  . Turmeric 500 MG TABS Take 500 mg by mouth 2 (two) times daily.  . valACYclovir (VALTREX) 500 MG tablet Take 1 tablet by mouth once daily  . [DISCONTINUED] doxycycline (VIBRA-TABS) 100 MG tablet Take 1 tablet (100 mg total) by mouth 2 (two) times daily.  . [DISCONTINUED] meloxicam (MOBIC) 7.5 MG tablet Take 2 tablets by mouth once daily   No facility-administered encounter medications on file as of 04/15/2019.      Activities of Daily Living In your present state of health, do you have any difficulty performing the following activities: 04/15/2019  Hearing? N  Vision? N  Difficulty concentrating or making decisions? N  Walking or climbing stairs? N  Dressing or bathing? N  Doing errands, shopping? N  Preparing Food and eating ? N  Using the Toilet? N  In the past six months, have you accidently leaked urine? N  Do you have problems with loss of bowel control? N  Managing your Medications? N  Managing your Finances? N  Housekeeping or managing your Housekeeping? N  Some recent data might be hidden    Patient Care Team: Plotnikov, Evie Lacks, MD as PCP - General Josue Hector, MD (Cardiology) Sydnee Cabal, MD (Orthopedic Surgery) Irene Shipper, MD as Consulting Physician (Gastroenterology) Revankar, Reita Cliche, MD as Consulting Physician (Cardiology) Tat, Eustace Quail, DO as Consulting Physician (Neurology)   Assessment:   This is a routine wellness examination for [place]. Physical assessment deferred to PCP.  Exercise Activities and Dietary recommendations Current Exercise Habits: Home exercise routine, Type of exercise: walking(stationary bike), Time (Minutes): 40, Frequency (Times/Week): 5, Weekly Exercise (Minutes/Week): 200, Intensity: Mild, Exercise limited by: orthopedic condition(s) Diet (meal preparation, eat out, water intake, caffeinated beverages, dairy products, fruits and vegetables): in general, a "healthy" diet  , well balanced   Reviewed heart healthy diet. Encouraged patient to increase daily water and healthy fluid intake.  Goals    . Patient Stated     I want to continue to exercise, eat healthy, enjoy family and camp       Fall Risk Fall Risk  04/15/2019 12/25/2017 09/25/2017  Falls in the past year? 1 Yes Yes  Number falls in  past yr: 0 - 1  Injury with Fall? 0 No No  Follow up Falls prevention discussed - Falls evaluation completed    Depression Screen PHQ 2/9  Scores 04/15/2019 12/25/2017 09/29/2017 07/18/2017  PHQ - 2 Score _0 -  PHQ- 9 Score _1 -  Exception Documentation - - - Other- indicate reason in comment box    Cognitive Function       Ad8 score reviewed for issues:  Issues making decisions: no  Less interest in hobbies / activities: no  Repeats questions, stories (family complaining): no  Trouble using ordinary gadgets (microwave, computer, phone):no  Forgets the month or year: no  Mismanaging finances: no  Remembering appts: no  Daily problems with thinking and/or memory: no Ad8 score is= 0  Immunization History  Administered Date(s) Administered  . Influenza Split 07/20/2012  . Influenza Whole 08/15/2011  . Influenza, High Dose Seasonal PF 07/27/2016, 07/27/2018  . Influenza,inj,Quad PF,6+ Mos 08/12/2013, 07/18/2017  . Influenza-Unspecified 07/15/2014, 10/15/2015, 08/02/2016  . Pneumococcal Conjugate-13 11/01/2018  . Td 01/28/2010  . Zoster 12/23/2013   Screening Tests Health Maintenance  Topic Date Due  . HIV Screening  07/03/1966  . INFLUENZA VACCINE  06/15/2019  . PNA vac Low Risk Adult (2 of 2 - PPSV23) 11/02/2019  . TETANUS/TDAP  01/29/2020  . COLONOSCOPY  04/14/2025  . Hepatitis C Screening  Completed       Plan:    Reviewed health maintenance screenings with patient today and relevant education, vaccines, and/or referrals were provided.   Continue to eat heart healthy diet (full of fruits, vegetables, whole grains, lean protein, water--limit salt, fat, and sugar intake) and increase physical activity as tolerated.  I have personally reviewed and noted the following in the patient's chart:   . Medical and social history . Use of alcohol, tobacco or illicit drugs  . Current medications and supplements . Functional ability and status . Nutritional status . Physical activity . Advanced directives . List of other physicians . Screenings to include cognitive, depression, and falls .  Referrals and appointments  In addition, I have reviewed and discussed with patient certain preventive protocols, quality metrics, and best practice recommendations. A written personalized care plan for preventive services as well as general preventive health recommendations were provided to patient.     Michiel Cowboy, RN  04/15/2019

## 2019-04-15 ENCOUNTER — Ambulatory Visit (INDEPENDENT_AMBULATORY_CARE_PROVIDER_SITE_OTHER): Payer: Medicare HMO | Admitting: *Deleted

## 2019-04-15 ENCOUNTER — Encounter: Payer: Self-pay | Admitting: Internal Medicine

## 2019-04-15 ENCOUNTER — Ambulatory Visit (INDEPENDENT_AMBULATORY_CARE_PROVIDER_SITE_OTHER): Payer: Medicare HMO | Admitting: Internal Medicine

## 2019-04-15 DIAGNOSIS — Z0000 Encounter for general adult medical examination without abnormal findings: Secondary | ICD-10-CM

## 2019-04-15 DIAGNOSIS — H9319 Tinnitus, unspecified ear: Secondary | ICD-10-CM | POA: Insufficient documentation

## 2019-04-15 DIAGNOSIS — E785 Hyperlipidemia, unspecified: Secondary | ICD-10-CM

## 2019-04-15 DIAGNOSIS — M25562 Pain in left knee: Secondary | ICD-10-CM | POA: Diagnosis not present

## 2019-04-15 DIAGNOSIS — H9313 Tinnitus, bilateral: Secondary | ICD-10-CM

## 2019-04-15 DIAGNOSIS — J320 Chronic maxillary sinusitis: Secondary | ICD-10-CM | POA: Diagnosis not present

## 2019-04-15 DIAGNOSIS — M25561 Pain in right knee: Secondary | ICD-10-CM | POA: Diagnosis not present

## 2019-04-15 MED ORDER — DOXYCYCLINE HYCLATE 100 MG PO TABS: 100.0000 mg | ORAL_TABLET | Freq: Two times a day (BID) | ORAL | 0 refills | Status: DC

## 2019-04-15 MED ORDER — GABAPENTIN 100 MG PO CAPS: 100.0000 mg | ORAL_CAPSULE | Freq: Three times a day (TID) | ORAL | 3 refills | Status: DC | PRN

## 2019-04-15 NOTE — Assessment & Plan Note (Signed)
Doxy x 14 days

## 2019-04-15 NOTE — Assessment & Plan Note (Signed)
Trial of Gabapentin Treat sinusitis /u w/ENT

## 2019-04-15 NOTE — Assessment & Plan Note (Signed)
Statin intolerant 

## 2019-04-15 NOTE — Progress Notes (Signed)
Virtual Visit via Video Note  I connected with [name] on 04/15/19 at  3:20 PM EDT by a video enabled telemedicine application and verified that I am speaking with the correct person using two identifiers.   I discussed the limitations of evaluation and management by telemedicine and the availability of in person appointments. The patient expressed understanding and agreed to proceed.  History of Present Illness: We need to follow-up on arthritis, dyslipidemia, GERD.  [place] gained some weight.  He continues to have a lot of pain in the knees and he is back.  Is complaining of mild sinusitis symptoms over the past month  There has been  runny nose, no cough, chest pain, shortness of breath, abdominal pain, diarrhea, constipation   Observations/Objective: The patient appears to be in no acute distress.  Assessment and Plan:  See my Assessment and Plan. Follow Up Instructions:    I discussed the assessment and treatment plan with the patient. The patient was provided an opportunity to ask questions and all were answered. The patient agreed with the plan and demonstrated an understanding of the instructions.   The patient was advised to call back or seek an in-person evaluation if the symptoms worsen or if the condition fails to improve as anticipated.  I provided face-to-face time during this encounter. We were at different locations.   Walker Kehr, MD

## 2019-04-15 NOTE — Assessment & Plan Note (Signed)
Tramadol prn  Potential benefits of a long term tramadol use as well as potential risks (i.e. addiction risk, apnea etc) and complications (i.e. Somnolence, constipation and others) were explained to the patient and were aknowledged. Loose wt

## 2019-04-15 NOTE — Patient Instructions (Addendum)
Continue to eat heart healthy diet (full of fruits, vegetables, whole grains, lean protein, water--limit salt, fat, and sugar intake) and increase physical activity as tolerated.   [name] , Thank you for taking time to come for your Medicare Wellness Visit. I appreciate your ongoing commitment to your health goals. Please review the following plan we discussed and let me know if I can assist you in the future.   These are the goals we discussed: Goals    . Patient Stated     I want to continue to exercise, eat healthy, enjoy family and camp       This is a list of the screening recommended for you and due dates:  Health Maintenance  Topic Date Due  . HIV Screening  07/03/1966  . Flu Shot  06/15/2019  . Pneumonia vaccines (2 of 2 - PPSV23) 11/02/2019  . Tetanus Vaccine  01/29/2020  . Colon Cancer Screening  04/14/2025  .  Hepatitis C: One time screening is recommended by Center for Disease Control  (CDC) for  adults born from [birth year] through 1963.   Completed    Preventive Care 66 Years and Older, Male Preventive care refers to lifestyle choices and visits with your health care provider that can promote health and wellness. What does preventive care include?   A yearly physical exam. This is also called an annual well check.  Dental exams once or twice a year.  Routine eye exams. Ask your health care provider how often you should have your eyes checked.  Personal lifestyle choices, including: ? Daily care of your teeth and gums. ? Regular physical activity. ? Eating a healthy diet. ? Avoiding tobacco and drug use. ? Limiting alcohol use. ? Practicing safe sex. ? Taking low doses of aspirin every day. ? Taking vitamin and mineral supplements as recommended by your health care provider. What happens during an annual well check? The services and screenings done by your health care provider during your annual well check will depend on your age, overall health, lifestyle risk  factors, and family history of disease. Counseling Your health care provider may ask you questions about your:  Alcohol use.  Tobacco use.  Drug use.  Emotional well-being.  Home and relationship well-being.  Sexual activity.  Eating habits.  History of falls.  Memory and ability to understand (cognition).  Work and work Statistician. Screening You may have the following tests or measurements:  Height, weight, and BMI.  Blood pressure.  Lipid and cholesterol levels. These may be checked every 5 years, or more frequently if you are over 68 years old.  Skin check.  Lung cancer screening. You may have this screening every year starting at age 19 if you have a 30-pack-year history of smoking and currently smoke or have quit within the past 15 years.  Colorectal cancer screening. All adults should have this screening starting at age 49 and continuing until age 43. You will have tests every 1-10 years, depending on your results and the type of screening test. People at increased risk should start screening at an earlier age. Screening tests may include: ? Guaiac-based fecal occult blood testing. ? Fecal immunochemical test (FIT). ? Stool DNA test. ? Virtual colonoscopy. ? Sigmoidoscopy. During this test, a flexible tube with a tiny camera (sigmoidoscope) is used to examine your rectum and lower colon. The sigmoidoscope is inserted through your anus into your rectum and lower colon. ? Colonoscopy. During this test, a long, thin, flexible tube with  a tiny camera (colonoscope) is used to examine your entire colon and rectum.  Prostate cancer screening. Recommendations will vary depending on your family history and other risks.  Hepatitis C blood test.  Hepatitis B blood test.  Sexually transmitted disease (STD) testing.  Diabetes screening. This is done by checking your blood sugar (glucose) after you have not eaten for a while (fasting). You may have this done every 1-3  years.  Abdominal aortic aneurysm (AAA) screening. You may need this if you are a current or former smoker.  Osteoporosis. You may be screened starting at age 40 if you are at high risk. Talk with your health care provider about your test results, treatment options, and if necessary, the need for more tests. Vaccines Your health care provider may recommend certain vaccines, such as:  Influenza vaccine. This is recommended every year.  Tetanus, diphtheria, and acellular pertussis (Tdap, Td) vaccine. You may need a Td booster every 10 years.  Varicella vaccine. You may need this if you have not been vaccinated.  Zoster vaccine. You may need this after age 11.  Measles, mumps, and rubella (MMR) vaccine. You may need at least one dose of MMR if you were born in 1957 or later. You may also need a second dose.  Pneumococcal 13-valent conjugate (PCV13) vaccine. One dose is recommended after age 1.  Pneumococcal polysaccharide (PPSV23) vaccine. One dose is recommended after age 72.  Meningococcal vaccine. You may need this if you have certain conditions.  Hepatitis A vaccine. You may need this if you have certain conditions or if you travel or work in places where you may be exposed to hepatitis A.  Hepatitis B vaccine. You may need this if you have certain conditions or if you travel or work in places where you may be exposed to hepatitis B.  Haemophilus influenzae type b (Hib) vaccine. You may need this if you have certain risk factors. Talk to your health care provider about which screenings and vaccines you need and how often you need them. This information is not intended to replace advice given to you by your health care provider. Make sure you discuss any questions you have with your health care provider. Document Released: 11/27/2015 Document Revised: 12/21/2017 Document Reviewed: 09/01/2015 Elsevier Interactive Patient Education  2019 [employer].

## 2019-04-22 ENCOUNTER — Telehealth: Payer: Self-pay | Admitting: *Deleted

## 2019-04-22 NOTE — Telephone Encounter (Signed)

## 2019-04-24 ENCOUNTER — Encounter: Payer: Self-pay | Admitting: Internal Medicine

## 2019-04-24 ENCOUNTER — Other Ambulatory Visit: Payer: Self-pay

## 2019-04-24 ENCOUNTER — Ambulatory Visit (AMBULATORY_SURGERY_CENTER): Payer: Medicare HMO | Admitting: Internal Medicine

## 2019-04-24 VITALS — BP 156/82 | HR 59 | Temp 98.8°F | Resp 9 | Ht 71.75 in | Wt 240.0 lb

## 2019-04-24 DIAGNOSIS — K219 Gastro-esophageal reflux disease without esophagitis: Secondary | ICD-10-CM | POA: Diagnosis not present

## 2019-04-24 DIAGNOSIS — K2100 Gastro-esophageal reflux disease with esophagitis, without bleeding: Secondary | ICD-10-CM

## 2019-04-24 DIAGNOSIS — D124 Benign neoplasm of descending colon: Secondary | ICD-10-CM

## 2019-04-24 DIAGNOSIS — Z800 Family history of malignant neoplasm of digestive organs: Secondary | ICD-10-CM | POA: Diagnosis not present

## 2019-04-24 DIAGNOSIS — D123 Benign neoplasm of transverse colon: Secondary | ICD-10-CM | POA: Diagnosis not present

## 2019-04-24 DIAGNOSIS — D122 Benign neoplasm of ascending colon: Secondary | ICD-10-CM

## 2019-04-24 DIAGNOSIS — K2970 Gastritis, unspecified, without bleeding: Secondary | ICD-10-CM

## 2019-04-24 DIAGNOSIS — K2270 Barrett's esophagus without dysplasia: Secondary | ICD-10-CM

## 2019-04-24 DIAGNOSIS — Z1211 Encounter for screening for malignant neoplasm of colon: Secondary | ICD-10-CM | POA: Diagnosis not present

## 2019-04-24 DIAGNOSIS — E785 Hyperlipidemia, unspecified: Secondary | ICD-10-CM | POA: Diagnosis not present

## 2019-04-24 HISTORY — PX: COLONOSCOPY: SHX174

## 2019-04-24 HISTORY — PX: UPPER GASTROINTESTINAL ENDOSCOPY: SHX188

## 2019-04-24 MED ORDER — SODIUM CHLORIDE 0.9 % IV SOLN: 500.0000 mL | Freq: Once | INTRAVENOUS | Status: DC

## 2019-04-24 NOTE — Progress Notes (Signed)
Called to room to assist during endoscopic procedure.  Patient ID and intended procedure confirmed with present staff. Received instructions for my participation in the procedure from the performing physician.  

## 2019-04-24 NOTE — Progress Notes (Signed)
[name] , CMA- Temp Judy Branson, CMA- Vitals  

## 2019-04-24 NOTE — Op Note (Signed)
[name] Procedure Date: 04/24/2019 9:39 AM MRN: [id number] Endoscopist: Docia Chuck. [name] , MD Age: 68 Referring MD:  Date of Birth: July 09, 1951 Gender: Male Account #: [id number] Procedure:                Colonoscopy with hot and cold snare polypectomy;                            biopsy; submucosal injection Indications:              Screening for colorectal malignant neoplasm Medicines:                Monitored Anesthesia Care Procedure:                Pre-Anesthesia Assessment:                           - Prior to the procedure, a History and Physical                            was performed, and patient medications and                            allergies were reviewed. The patient's tolerance of                            previous anesthesia was also reviewed. The risks                            and benefits of the procedure and the sedation                            options and risks were discussed with the patient.                            All questions were answered, and informed consent                            was obtained. Prior Anticoagulants: The patient has                            taken no previous anticoagulant or antiplatelet                            agents. ASA Grade Assessment: II - A patient with                            mild systemic disease. After reviewing the risks                            and benefits, the patient was deemed in                            satisfactory condition to undergo the procedure.  After obtaining informed consent, the colonoscope                            was passed under direct vision. Throughout the                            procedure, the patient's blood pressure, pulse, and                            oxygen saturations were monitored continuously. The                            Colonoscope was introduced through the anus and   advanced to the the cecum, identified by                            appendiceal orifice and ileocecal valve. The                            ileocecal valve, appendiceal orifice, and rectum                            were photographed. The quality of the bowel                            preparation was excellent. The colonoscopy was                            performed without difficulty. The patient tolerated                            the procedure well. The bowel preparation was                            EXTENSIVE and used magnesium citrate and SUPREP via                            split dose instruction. Scope In: 9:49:12 AM Scope Out: 10:21:42 AM Scope Withdrawal Time: 0 hours 29 minutes 56 seconds  Total Procedure Duration: 0 hours 32 minutes 30 seconds  Findings:                 A 20 mm polyp was found in the proximal ascending                            colon. The polyp was sessile. The polyp was removed                            with a saline injection-lift technique using a hot                            snare. Resection and retrieval were complete. Area  was tattooed with an injection of Spot (carbon                            black).                           A 5 mm polyp was found in the transverse colon. The                            polyp was sessile. The polyp was removed with a                            cold snare. Resection and retrieval were complete.                           A 1 mm polyp was found in the descending colon. The                            polyp was removed with a jumbo cold forceps.                            Resection and retrieval were complete.                           The exam was otherwise without abnormality on                            direct and retroflexion views. Complications:            No immediate complications. Estimated blood loss:                            None. Estimated Blood Loss:     Estimated blood  loss: none. Impression:               - One 20 mm polyp in the proximal ascending colon,                            removed using injection-lift and a hot snare.                            Resected and retrieved. Tattooed.                           - One 5 mm polyp in the transverse colon, removed                            with a cold snare. Resected and retrieved.                           - One 1 mm polyp in the descending colon, removed                            with a jumbo cold forceps. Resected and retrieved.                           -  The examination was otherwise normal on direct                            and retroflexion views. Recommendation:           - Repeat colonoscopy in 6 months for surveillance.                            Same EXTENSIVE PREP.                           - Patient has a contact number available for                            emergencies. The signs and symptoms of potential                            delayed complications were discussed with the                            patient. Return to normal activities tomorrow.                            Written discharge instructions were provided to the                            patient.                           - Resume previous diet.                           - Continue present medications.                           - Await pathology results. Docia Chuck. Andre Pastor, MD 04/24/2019 10:49:18 AM This report has been signed electronically.

## 2019-04-24 NOTE — Op Note (Signed)
[facility] Patient Name: [name] Procedure Date: 04/24/2019 9:38 AM MRN: [id number] Endoscopist: Docia Chuck. Henrene Pastor , MD Age: 68 Referring MD:  Date of Birth: 1951-06-01 Gender: Male Account #: [id number] Procedure:                Upper GI endoscopy with biopsies Indications:              Esophageal reflux, Exclusion of Barrett's esophagus                            (1 previous exam without mention, subsequent exam                            2017 reports Barrett's) Medicines:                Monitored Anesthesia Care Procedure:                Pre-Anesthesia Assessment:                           - Prior to the procedure, a History and Physical                            was performed, and patient medications and                            allergies were reviewed. The patient's tolerance of                            previous anesthesia was also reviewed. The risks                            and benefits of the procedure and the sedation                            options and risks were discussed with the patient.                            All questions were answered, and informed consent                            was obtained. Prior Anticoagulants: The patient has                            taken no previous anticoagulant or antiplatelet                            agents. ASA Grade Assessment: II - A patient with                            mild systemic disease. After reviewing the risks                            and benefits, the patient was deemed in  satisfactory condition to undergo the procedure.                           After obtaining informed consent, the endoscope was                            passed under direct vision. Throughout the                            procedure, the patient's blood pressure, pulse, and                            oxygen saturations were monitored continuously. The                            Endoscope was  introduced through the mouth, and                            advanced to the second part of duodenum. The upper                            GI endoscopy was accomplished without difficulty.                            The patient tolerated the procedure well. Scope In: Scope Out: Findings:                 The esophagus revealed mild esophagitis. The                            gastroesophageal junction was in the normal                            anatomic position. 2 tiny islands of columnar                            mucosa adjacent. This did not meet the endoscopic                            definition of Barrett's esophagus.                           The stomach was normal except for mild patchy                            erythema in the body. Biopsies were taken with a                            cold forceps for Helicobacter pylori testing using                            CLOtest.                           The examined duodenum was normal.  The cardia and gastric fundus were normal on                            retroflexion. Complications:            No immediate complications. Estimated Blood Loss:     Estimated blood loss: none. Impression:               1. Mild esophagitis without Barrett's                           2. Mild gastritis. Status post CLO biopsy.                           3. Otherwise normal EGD. Recommendation:           - Patient has a contact number available for                            emergencies. The signs and symptoms of potential                            delayed complications were discussed with the                            patient. Return to normal activities tomorrow.                            Written discharge instructions were provided to the                            patient.                           - Resume previous diet.                           - Continue present medications.                           - Await  pathology results. Docia Chuck. Henrene Pastor, MD 04/24/2019 10:54:09 AM This report has been signed electronically.

## 2019-04-24 NOTE — Progress Notes (Signed)
Report to PACU, RN, vss, BBS= Clear.  

## 2019-04-24 NOTE — Patient Instructions (Signed)
Thank you for allowing [place] to care for you today!  Await pathology results by mail, approximately 7-10 days.    Recommend another colonoscopy in 6 months with the same extensive prep.  Resume previous diet and medications today.  Return to your normal activities tomorrow.      YOU HAD AN ENDOSCOPIC PROCEDURE TODAY AT [facility]:   Refer to the procedure report that was given to you for any specific questions about what was found during the examination.  If the procedure report does not answer your questions, please call your gastroenterologist to clarify.  If you requested that your care partner not be given the details of your procedure findings, then the procedure report has been included in a sealed envelope for you to review at your convenience later.  YOU SHOULD EXPECT: Some feelings of bloating in the abdomen. Passage of more gas than usual.  Walking can help get rid of the air that was put into your GI tract during the procedure and reduce the bloating. If you had a lower endoscopy (such as a colonoscopy or flexible sigmoidoscopy) you may notice spotting of blood in your stool or on the toilet paper. If you underwent a bowel prep for your procedure, you may not have a normal bowel movement for a few days.  Please Note:  You might notice some irritation and congestion in your nose or some drainage.  This is from the oxygen used during your procedure.  There is no need for concern and it should clear up in a day or so.  SYMPTOMS TO REPORT IMMEDIATELY:   Following lower endoscopy (colonoscopy or flexible sigmoidoscopy):  Excessive amounts of blood in the stool  Significant tenderness or worsening of abdominal pains  Swelling of the abdomen that is new, acute  Fever of 100F or higher   Following upper endoscopy (EGD)  Vomiting of blood or coffee ground material  New chest pain or pain under the shoulder blades  Painful or persistently difficult swallowing  New  shortness of breath  Fever of 100F or higher  Black, tarry-looking stools  For urgent or emergent issues, a gastroenterologist can be reached at any hour by calling [phone].   DIET:  We do recommend a small meal at first, but then you may proceed to your regular diet.  Drink plenty of fluids but you should avoid alcoholic beverages for 24 hours.  ACTIVITY:  You should plan to take it easy for the rest of today and you should NOT DRIVE or use heavy machinery until tomorrow (because of the sedation medicines used during the test).    FOLLOW UP: Our staff will call the number listed on your records 48-72 hours following your procedure to check on you and address any questions or concerns that you may have regarding the information given to you following your procedure. If we do not reach you, we will leave a message.  We will attempt to reach you two times.  During this call, we will ask if you have developed any symptoms of COVID 19. If you develop any symptoms (ie: fever, flu-like symptoms, shortness of breath, cough etc.) before then, please call [phone].  If you test positive for Covid 19 in the 2 weeks post procedure, please call and report this information to [place].    If any biopsies were taken you will be contacted by phone or by letter within the next 1-3 weeks.  Please call us at [phone] if you  have not heard about the biopsies in 3 weeks.    SIGNATURES/CONFIDENTIALITY: You and/or your care partner have signed paperwork which will be entered into your electronic medical record.  These signatures attest to the fact that that the information above on your After Visit Summary has been reviewed and is understood.  Full responsibility of the confidentiality of this discharge information lies with you and/or your care-partner.

## 2019-04-24 NOTE — Progress Notes (Signed)
Pt's states no medical or surgical changes since previsit or office visit. 

## 2019-04-26 LAB — HELICOBACTER PYLORI SCREEN-BIOPSY: UREASE: NEGATIVE

## 2019-04-27 IMAGING — DX DG CHEST 2V
2 series · 2 of 2 positions shown · non-contrast
Comparison: Radiographs September 26, 2016 and November 12, 2015.

CLINICAL DATA: Chest pain.

EXAM:
CHEST  2 VIEW

[chest pa]
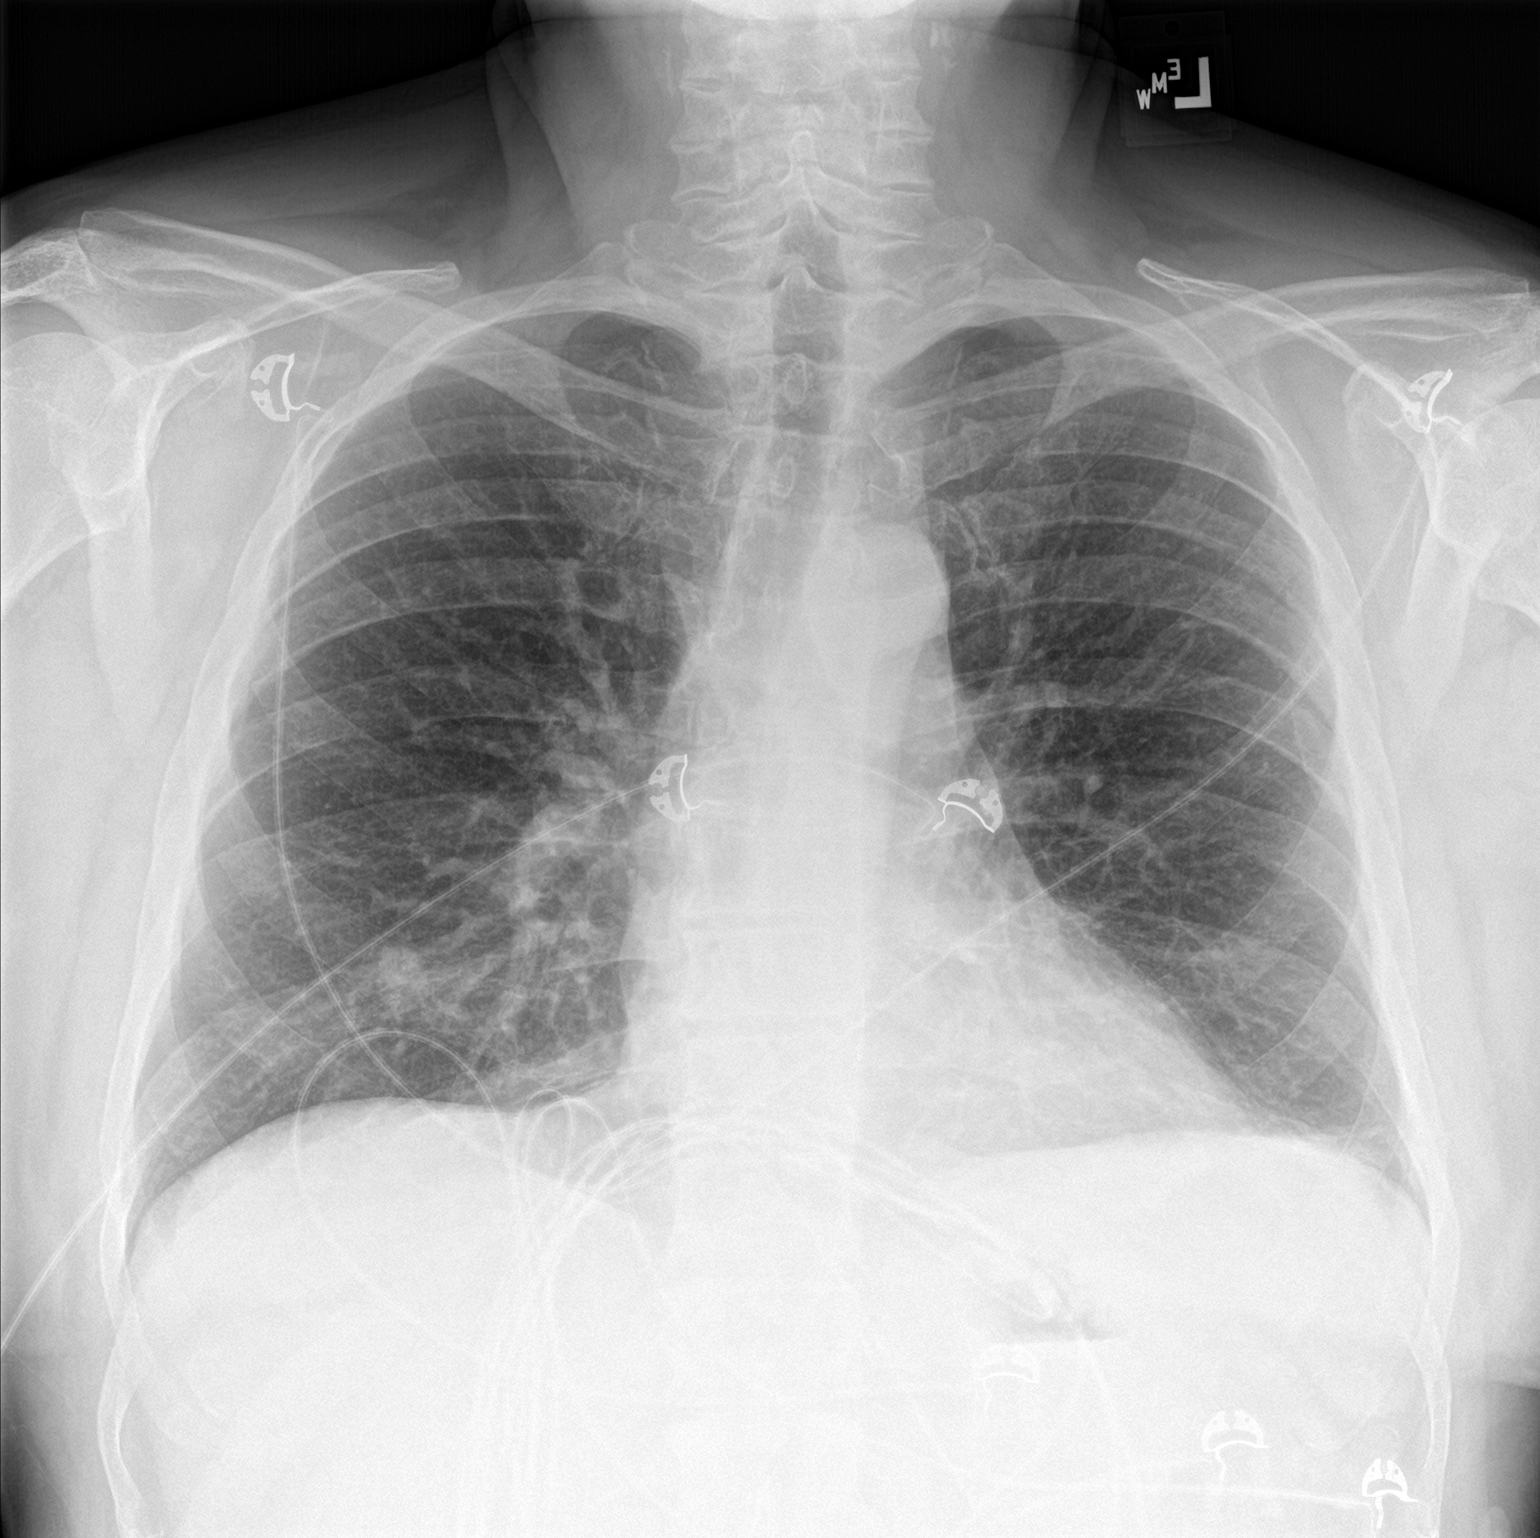

[chest lat]
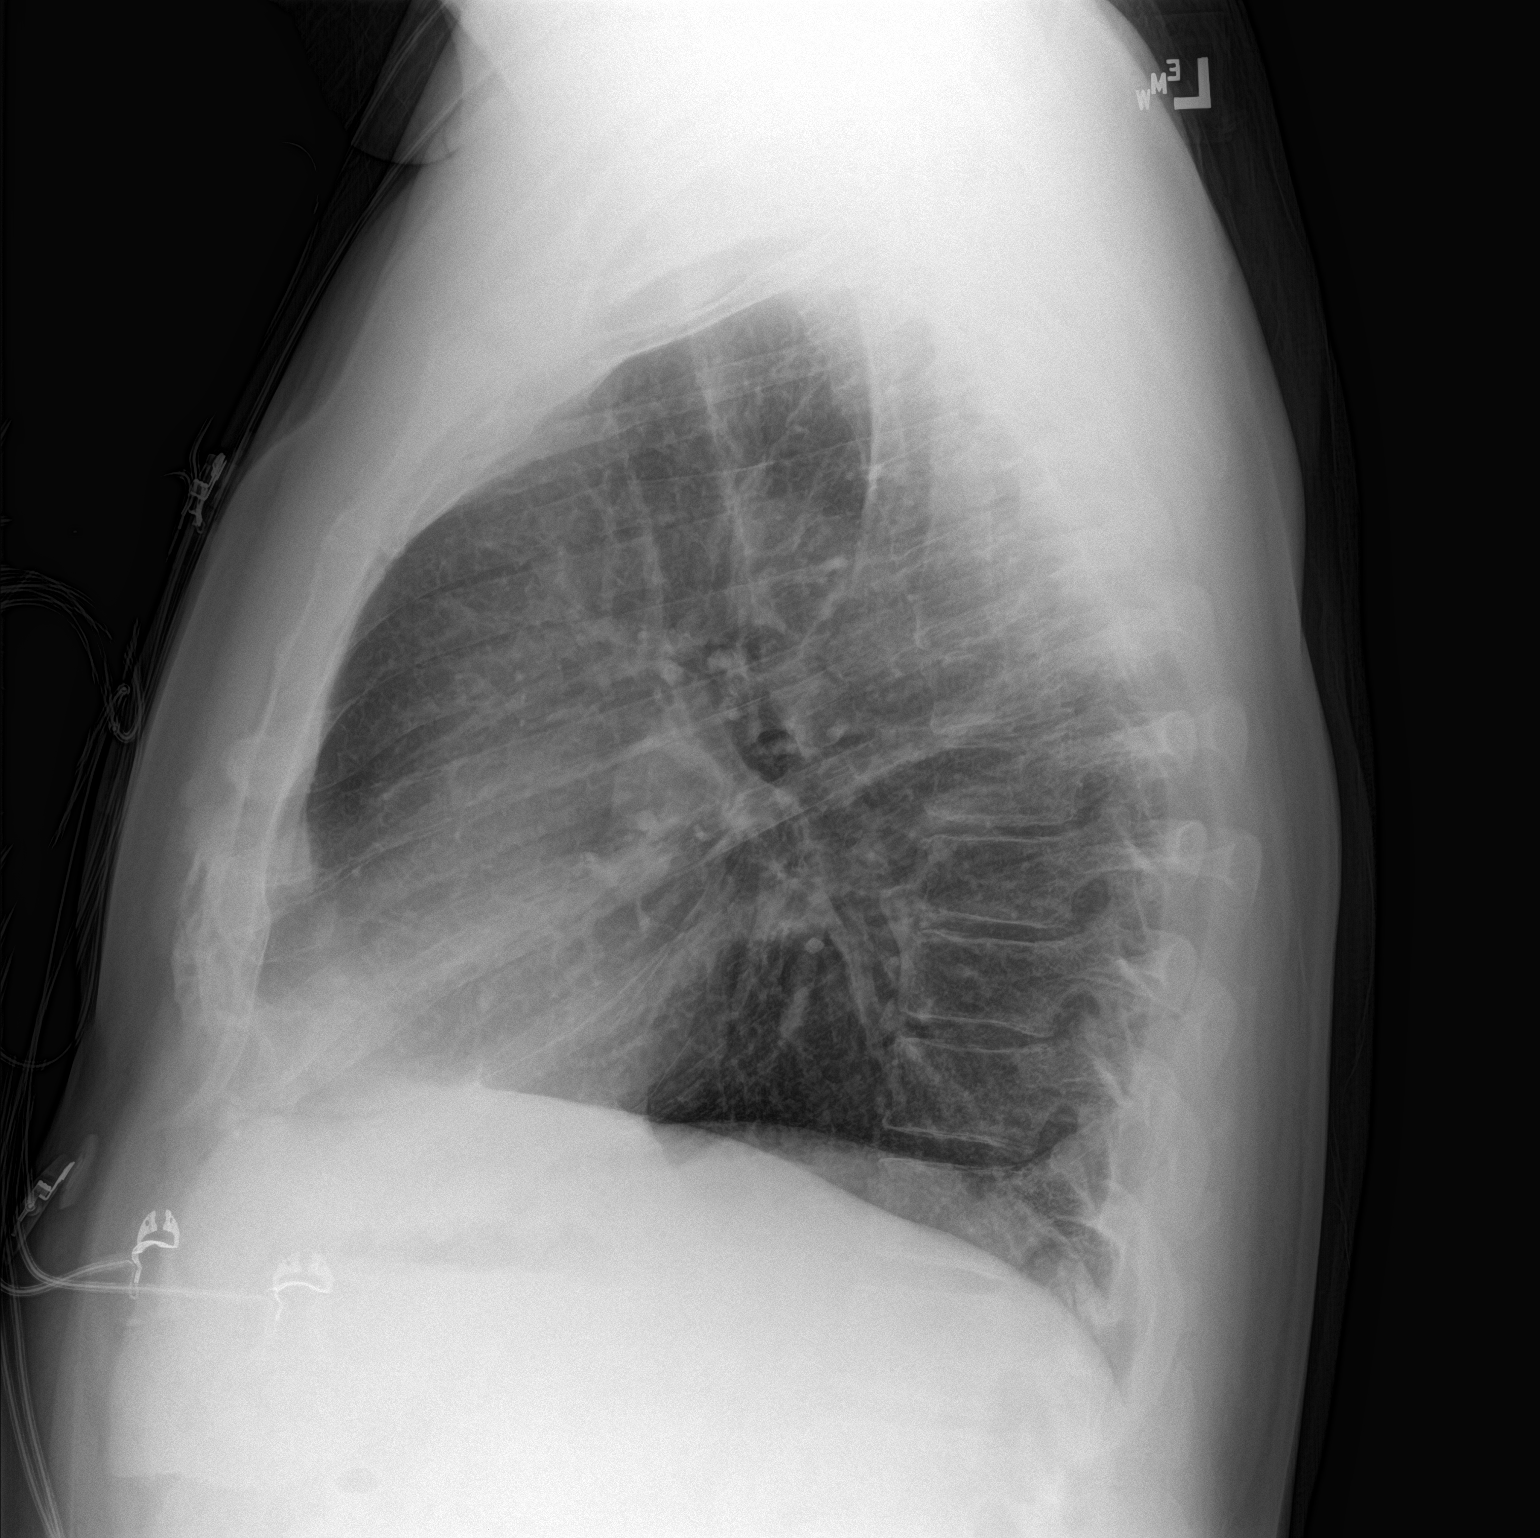

[2 of 2 positions shown; findings below may reference images not displayed]

FINDINGS: The heart size and mediastinal contours are within normal limits. No
pneumothorax or pleural effusion is noted. Minimal left basilar
scarring is noted. No acute pulmonary disease is noted. The
visualized skeletal structures are unremarkable.
IMPRESSION: No active cardiopulmonary disease.

## 2019-04-29 ENCOUNTER — Other Ambulatory Visit: Payer: Self-pay | Admitting: Internal Medicine

## 2019-05-01 ENCOUNTER — Telehealth: Payer: Self-pay

## 2019-05-01 NOTE — Telephone Encounter (Signed)
Called patient and let him know his EGD/Colon results have not been released from the Dr. Yet. We will contact him as soon as they are.

## 2019-05-08 ENCOUNTER — Encounter: Payer: Self-pay | Admitting: Internal Medicine

## 2019-06-03 ENCOUNTER — Other Ambulatory Visit: Payer: Self-pay | Admitting: Internal Medicine

## 2019-06-20 ENCOUNTER — Other Ambulatory Visit: Payer: Self-pay | Admitting: Internal Medicine

## 2019-06-20 NOTE — Telephone Encounter (Signed)
Last OV 04/15/19 Next OV 07/25/19  [place] Controlled Substance Database checked. Last filled on 05/20/19

## 2019-07-08 ENCOUNTER — Other Ambulatory Visit: Payer: Self-pay | Admitting: Internal Medicine

## 2019-07-11 ENCOUNTER — Other Ambulatory Visit: Payer: Self-pay | Admitting: Internal Medicine

## 2019-07-25 ENCOUNTER — Encounter: Payer: Self-pay | Admitting: Internal Medicine

## 2019-07-25 ENCOUNTER — Other Ambulatory Visit: Payer: Self-pay

## 2019-07-25 ENCOUNTER — Ambulatory Visit (INDEPENDENT_AMBULATORY_CARE_PROVIDER_SITE_OTHER): Payer: Medicare HMO | Admitting: Internal Medicine

## 2019-07-25 VITALS — BP 150/88 | HR 80 | Temp 98.2°F | Ht 71.75 in | Wt 245.0 lb

## 2019-07-25 DIAGNOSIS — M5440 Lumbago with sciatica, unspecified side: Secondary | ICD-10-CM | POA: Diagnosis not present

## 2019-07-25 DIAGNOSIS — Z23 Encounter for immunization: Secondary | ICD-10-CM | POA: Diagnosis not present

## 2019-07-25 DIAGNOSIS — F419 Anxiety disorder, unspecified: Secondary | ICD-10-CM | POA: Diagnosis not present

## 2019-07-25 DIAGNOSIS — R739 Hyperglycemia, unspecified: Secondary | ICD-10-CM | POA: Diagnosis not present

## 2019-07-25 DIAGNOSIS — I2510 Atherosclerotic heart disease of native coronary artery without angina pectoris: Secondary | ICD-10-CM

## 2019-07-25 DIAGNOSIS — Z0000 Encounter for general adult medical examination without abnormal findings: Secondary | ICD-10-CM | POA: Diagnosis not present

## 2019-07-25 NOTE — Progress Notes (Signed)
Subjective:  Patient ID: [name], male    DOB: Aug 14, 1951  Age: 68 y.o. MRN: [id number]  CC: No chief complaint on file.   HPI [name] presents for LBP, knee OA, B12 def, anxiety f/u  Outpatient Medications Prior to Visit  Medication Sig Dispense Refill  . Alum Hydroxide-Mag Carbonate (GAVISCON PO) Take 1 tablet by mouth daily as needed (indigestion).    . Ascorbic Acid (VITAMIN C) 1000 MG tablet Take 1,000 mg by mouth daily.    [name] aspirin 81 MG tablet Take 81 mg by mouth at bedtime.     . cholecalciferol (VITAMIN D) 1000 units tablet Take 1,000 Units by mouth daily.    . Clotrimazole (LOTRIMIN AF JOCK ITCH EX) Apply 1 application topically daily as needed (sweat).    . Cyanocobalamin (VITAMIN B-12) 2500 MCG SUBL Place 5,000 mcg under the tongue daily.    [name] escitalopram (LEXAPRO) 10 MG tablet Take 1 tablet by mouth once daily 90 tablet 3  . fluticasone (FLONASE) 50 MCG/ACT nasal spray Use 1 spray(s) in each nostril once daily 48 g 3  . gabapentin (NEURONTIN) 100 MG capsule Take 1-2 capsules (100-200 mg total) by mouth 3 (three) times daily as needed (pain or ringing in the ears). 100 capsule 3  . Ginger, Zingiber officinalis, (GINGER ROOT PO) Take by mouth.    . loratadine (CLARITIN) 10 MG tablet Take 1 tablet (10 mg total) by mouth daily. 100 tablet 3  . meloxicam (MOBIC) 7.5 MG tablet Take 2 tablets by mouth once daily 180 tablet 0  . Misc Natural Products (TART CHERRY ADVANCED PO) Take by mouth.    . Multiple Vitamin (MULTIVITAMIN WITH MINERALS) TABS tablet Take 1 tablet by mouth daily.    . ondansetron (ZOFRAN) 4 MG tablet TAKE 1 TO 2 TABLETS BY MOUTH EVERY 4 TO 6 HOURS AS NEEDED FOR NAUSEA;  Patient needs office visit for further refills 30 tablet 1  . OVER THE COUNTER MEDICATION Flax Seed Oil, One capsule daily.    [name] OVER THE COUNTER MEDICATION Fish Oil , one capsule daily.    . pantoprazole (PROTONIX) 40 MG tablet TAKE 1 TABLET BY MOUTH TWICE DAILY 180 tablet 3  .  Red Yeast Rice 600 MG TABS Take 1,200 mg by mouth daily.    . traMADol (ULTRAM) 50 MG tablet TAKE 1 TO 2 TABLETS BY MOUTH THREE TIMES DAILY AS NEEDED 180 tablet 1  . triamcinolone ointment (KENALOG) 0.5 % APPLY OINTMENT TOPICALLY TWICE DAILY 45 g 2  . Turmeric 500 MG TABS Take 500 mg by mouth 2 (two) times daily.    . valACYclovir (VALTREX) 500 MG tablet Take 1 tablet by mouth once daily 90 tablet 0  . nitroGLYCERIN (NITROSTAT) 0.4 MG SL tablet Place 1 tablet (0.4 mg total) under the tongue every 5 (five) minutes as needed for chest pain. 11 tablet 6  . doxycycline (VIBRA-TABS) 100 MG tablet Take 1 tablet (100 mg total) by mouth 2 (two) times daily. (Patient not taking: Reported on 07/25/2019) 28 tablet 0   No facility-administered medications prior to visit.     ROS: Review of Systems  Constitutional: Positive for unexpected weight change. Negative for appetite change and fatigue.  HENT: Negative for congestion, nosebleeds, sneezing, sore throat and trouble swallowing.   Eyes: Negative for itching and visual disturbance.  Respiratory: Negative for cough.   Cardiovascular: Negative for chest pain, palpitations and leg swelling.  Gastrointestinal: Negative for abdominal distention, blood in stool,  diarrhea and nausea.  Genitourinary: Negative for frequency and hematuria.  Musculoskeletal: Positive for arthralgias, back pain and gait problem. Negative for joint swelling and neck pain.  Skin: Negative for rash.  Neurological: Negative for dizziness, tremors, speech difficulty and weakness.  Psychiatric/Behavioral: Negative for agitation, dysphoric mood, sleep disturbance and suicidal ideas. The patient is nervous/anxious.     Objective:  BP (!) 150/88 (BP Location: Left Arm, Patient Position: Sitting, Cuff Size: Large)   Pulse 80   Temp 98.2 F (36.8 C) (Oral)   Ht 5' 11.75" (1.822 m)   Wt 245 lb (111.1 kg)   SpO2 96%   BMI 33.46 kg/m   BP Readings from Last 3 Encounters:  07/25/19  (!) 150/88  04/24/19 (!) 156/82  11/01/18 130/74    Wt Readings from Last 3 Encounters:  07/25/19 245 lb (111.1 kg)  04/24/19 240 lb (108.9 kg)  04/10/19 240 lb (108.9 kg)    Physical Exam Constitutional:      General: He is not in acute distress.    Appearance: He is well-developed. He is obese.     Comments: NAD  Eyes:     Conjunctiva/sclera: Conjunctivae normal.     Pupils: Pupils are equal, round, and reactive to light.  Neck:     Musculoskeletal: Normal range of motion.     Thyroid: No thyromegaly.     Vascular: No JVD.  Cardiovascular:     Rate and Rhythm: Normal rate and regular rhythm.     Heart sounds: Normal heart sounds. No murmur. No friction rub. No gallop.   Pulmonary:     Effort: Pulmonary effort is normal. No respiratory distress.     Breath sounds: Normal breath sounds. No wheezing or rales.  Chest:     Chest wall: No tenderness.  Abdominal:     General: Bowel sounds are normal. There is no distension.     Palpations: Abdomen is soft. There is no mass.     Tenderness: There is no abdominal tenderness. There is no guarding or rebound.  Musculoskeletal: Normal range of motion.        General: Tenderness present.  Lymphadenopathy:     Cervical: No cervical adenopathy.  Skin:    General: Skin is warm and dry.     Findings: No rash.  Neurological:     Mental Status: He is alert and oriented to person, place, and time.     Cranial Nerves: No cranial nerve deficit.     Motor: No abnormal muscle tone.     Coordination: Coordination normal.     Gait: Gait normal.     Deep Tendon Reflexes: Reflexes are normal and symmetric.  Psychiatric:        Behavior: Behavior normal.        Thought Content: Thought content normal.        Judgment: Judgment normal.   knees - painful, limp  Lab Results  Component Value Date   WBC 7.4 09/05/2018   HGB 14.5 09/05/2018   HCT 42.4 09/05/2018   PLT 238.0 09/05/2018   GLUCOSE 107 (H) 11/12/2018   CHOL 235 (H)  04/25/2018   TRIG 255.0 (H) 04/25/2018   HDL 37.90 (L) 04/25/2018   LDLDIRECT 151.0 04/25/2018   LDLCALC 154 (H) 07/12/2017   ALT 41 09/05/2018   AST 28 09/05/2018   NA 135 11/12/2018   K 4.3 11/12/2018   CL 101 11/12/2018   CREATININE 0.83 11/12/2018   BUN 24 (H) 11/12/2018   CO2  24 11/12/2018   TSH 3.82 09/05/2018   PSA 1.16 04/25/2018   INR 1.0 08/29/2017   HGBA1C 5.7 04/25/2018    Ct Soft Tissue Neck W Contrast  Result Date: 09/07/2018 CLINICAL DATA:  Submandibular swelling.  Occasional jaw pain. EXAM: CT NECK WITH CONTRAST TECHNIQUE: Multidetector CT imaging of the neck was performed using the standard protocol following the bolus administration of intravenous contrast. CONTRAST:  39mL ISOVUE-300 IOPAMIDOL (ISOVUE-300) INJECTION 61% COMPARISON:  None. FINDINGS: Pharynx and larynx: No mucosal or submucosal lesion. Salivary glands: Parotid and submandibular glands are normal. No mass, inflammatory disease or stone disease. Thyroid: Normal Lymph nodes: No enlarged or low-density nodes on either side of the neck. No asymmetric nodes. Skin marker in the region of concern overlies the right submandibular region, but there is no regional pathologic finding by CT. Vascular: Carotid bifurcation atherosclerosis. No other vascular finding. Limited intracranial: Normal Visualized orbits: Normal Mastoids and visualized paranasal sinuses: Clear Skeleton: Ordinary cervical spondylosis. Upper chest: Negative Other: None IMPRESSION: No pathologic finding by CT. Skin marker overlying the area of concern is present overlying the right submandibular gland. The submandibular gland itself appears normal in there is no evidence of regional adenopathy or other soft tissue finding. Electronically Signed   By: Nelson Chimes M.D.   On: 09/07/2018 13:03    Assessment & Plan:   Diagnoses and all orders for this visit:  Need for influenza vaccination -     Flu Vaccine QUAD High Dose(Fluad)     No orders of  the defined types were placed in this encounter.    Follow-up: No follow-ups on file.  Walker Kehr, MD

## 2019-07-25 NOTE — Assessment & Plan Note (Signed)
ASA

## 2019-07-25 NOTE — Assessment & Plan Note (Addendum)
lexapro

## 2019-07-25 NOTE — Patient Instructions (Addendum)

## 2019-07-25 NOTE — Assessment & Plan Note (Signed)
Tramadol  Potential benefits of a long term opioids use as well as potential risks (i.e. addiction risk, apnea etc) and complications (i.e. Somnolence, constipation and others) were explained to the patient and were aknowledged.   

## 2019-09-03 ENCOUNTER — Other Ambulatory Visit: Payer: Self-pay | Admitting: Internal Medicine

## 2019-09-04 NOTE — Telephone Encounter (Signed)
Please advise about refill in Dr. Plotnikovs absence. 

## 2019-10-03 ENCOUNTER — Other Ambulatory Visit: Payer: Self-pay | Admitting: Internal Medicine

## 2019-10-03 NOTE — Telephone Encounter (Signed)
Check [place] registry last filled 09/04/2019../[email or web address]  

## 2019-10-04 ENCOUNTER — Emergency Department (HOSPITAL_COMMUNITY): Payer: Medicare HMO

## 2019-10-04 ENCOUNTER — Other Ambulatory Visit: Payer: Self-pay

## 2019-10-04 ENCOUNTER — Encounter (HOSPITAL_COMMUNITY): Payer: Self-pay

## 2019-10-04 ENCOUNTER — Emergency Department (HOSPITAL_COMMUNITY)
Admission: EM | Admit: 2019-10-04 | Discharge: 2019-10-04 | Disposition: A | Discharge status: Home / Self Care | Payer: Medicare HMO | Attending: Emergency Medicine | Admitting: Emergency Medicine

## 2019-10-04 DIAGNOSIS — R0602 Shortness of breath: Secondary | ICD-10-CM | POA: Insufficient documentation

## 2019-10-04 DIAGNOSIS — R1010 Upper abdominal pain, unspecified: Secondary | ICD-10-CM | POA: Diagnosis not present

## 2019-10-04 DIAGNOSIS — R079 Chest pain, unspecified: Secondary | ICD-10-CM

## 2019-10-04 DIAGNOSIS — R1011 Right upper quadrant pain: Secondary | ICD-10-CM | POA: Diagnosis not present

## 2019-10-04 DIAGNOSIS — R0789 Other chest pain: Secondary | ICD-10-CM | POA: Insufficient documentation

## 2019-10-04 DIAGNOSIS — K760 Fatty (change of) liver, not elsewhere classified: Secondary | ICD-10-CM | POA: Diagnosis not present

## 2019-10-04 DIAGNOSIS — Z96652 Presence of left artificial knee joint: Secondary | ICD-10-CM | POA: Insufficient documentation

## 2019-10-04 DIAGNOSIS — Z79899 Other long term (current) drug therapy: Secondary | ICD-10-CM | POA: Diagnosis not present

## 2019-10-04 DIAGNOSIS — I2510 Atherosclerotic heart disease of native coronary artery without angina pectoris: Secondary | ICD-10-CM | POA: Diagnosis not present

## 2019-10-04 DIAGNOSIS — Z87891 Personal history of nicotine dependence: Secondary | ICD-10-CM | POA: Insufficient documentation

## 2019-10-04 LAB — COMPREHENSIVE METABOLIC PANEL
ALT: 48 U/L — ABNORMAL HIGH (ref 0–44)
AST: 31 U/L (ref 15–41)
Albumin: 4.2 g/dL (ref 3.5–5.0)
Alkaline Phosphatase: 62 U/L (ref 38–126)
Anion gap: 10 (ref 5–15)
BUN: 23 mg/dL (ref 8–23)
CO2: 23 mmol/L (ref 22–32)
Calcium: 9.4 mg/dL (ref 8.9–10.3)
Chloride: 105 mmol/L (ref 98–111)
Creatinine, Ser: 0.82 mg/dL (ref 0.61–1.24)
GFR calc Af Amer: >60 mL/min (ref >60)
GFR calc non Af Amer: >60 mL/min (ref >60)
Glucose, Bld: 119 mg/dL — ABNORMAL HIGH (ref 70–99)
Potassium: 4 mmol/L (ref 3.5–5.1)
Sodium: 138 mmol/L (ref 135–145)
Total Bilirubin: 0.7 mg/dL (ref 0.3–1.2)
Total Protein: 7.5 g/dL (ref 6.5–8.1)

## 2019-10-04 LAB — CBC
HCT: 44.5 % (ref 39.0–52.0)
Hemoglobin: 15.2 g/dL (ref 13.0–17.0)
MCH: 32.3 pg (ref 26.0–34.0)
MCHC: 34.2 g/dL (ref 30.0–36.0)
MCV: 94.5 fL (ref 80.0–100.0)
Platelets: 230 10*3/uL (ref 150–400)
RBC: 4.71 MIL/uL (ref 4.22–5.81)
RDW: 12.4 % (ref 11.5–15.5)
WBC: 9.1 10*3/uL (ref 4.0–10.5)
nRBC: 0 % (ref 0.0–0.2)

## 2019-10-04 LAB — TROPONIN I (HIGH SENSITIVITY)
Troponin I (High Sensitivity): <2 ng/L (ref <18)
Troponin I (High Sensitivity): <2 ng/L (ref <18)

## 2019-10-04 LAB — LIPASE, BLOOD: Lipase: 35 U/L (ref 11–51)

## 2019-10-04 MED ORDER — NITROGLYCERIN 0.4 MG SL SUBL
0.4000 mg | SUBLINGUAL_TABLET | SUBLINGUAL | Status: DC | PRN
  Filled 2019-10-04: qty 1

## 2019-10-04 MED ORDER — ASPIRIN 81 MG PO CHEW
324.0000 mg | CHEWABLE_TABLET | Freq: Once | ORAL | Status: AC
  Administered 2019-10-04: 324 mg via ORAL
  Filled 2019-10-04: qty 4

## 2019-10-04 MED ORDER — SODIUM CHLORIDE 0.9% FLUSH
3.0000 mL | Freq: Once | INTRAVENOUS | Status: AC
  Administered 2019-10-04: 3 mL via INTRAVENOUS

## 2019-10-04 NOTE — ED Notes (Signed)
Ultrasound at bedside

## 2019-10-04 NOTE — ED Provider Notes (Signed)
[facility] DEPT Provider Note   CSN: [id number] Arrival date & time: 10/04/19  [id number]     History   Chief Complaint Chief Complaint  Patient presents with  . Chest Pain  . Shortness of Breath  . Dizziness    HPI [name] is a 68 y.o. male.  HPI  68 year old male presents with chest pain and shortness of breath.  States he put up about 200 feet of fence 1 week ago and then was more short of breath than he would expect afterwards.  Has been having shortness of breath with minimal exertion since.  Chest pain has also become present starting 6 days ago.  Seems to come and go throughout the day.  Feels like a pressure in his inferior chest.  No nausea, vomiting, or radiation of the pain but he does get dizzy sometimes.  Has not noticed any symptoms with eating.  The chest pain does not have any specific inciting factors.  No leg swelling.  No diaphoresis.  He went to an urgent care where he was told his ECG was abnormal and they wanted to call an ambulance but he instead drove here. Chest pain is better than previous and only mildly present now.  Past Medical History:  Diagnosis Date  . Actinic keratosis   . Allergy   . Colonic polyp   . COLONIC POLYPS, HX OF   . CONTACT DERMATITIS DUE TO SOLVENTS   . DOE (dyspnea on exertion) 07-25-12   related to eating shrimp  . GERD (gastroesophageal reflux disease)   . HAND PAIN   . Heart murmur   . HERPES, GENITAL NOS   . Hyperlipidemia   . Ingrowing toenail of left foot 07-25-12   is resolved now 07-25-12  . KNEE PAIN   . Neoplasm of uncertain behavior of skin   . OA (osteoarthritis) of knee   . ONYCHOMYCOSIS   . OSTEOARTHRITIS   . Preop exam for internal medicine   . SINUSITIS, ACUTE   . Well adult exam     Patient Active Problem List   Diagnosis Date Noted  . Tinnitus 04/15/2019  . Localized swelling, mass or lump of neck 09/05/2018  . Rash and nonspecific skin eruption 07/27/2018  . Upper  respiratory infection 07/27/2018  . DDD (degenerative disc disease), cervical 07/06/2018  . DDD (degenerative disc disease), lumbar 07/06/2018  . Chondrocalcinosis 07/06/2018  . Hypogonadism in male 04/25/2018  . Tremor 09/15/2017  . Anxiety 08/24/2017  . Nausea 07/18/2017  . Dizzinesses 07/18/2017  . Arthralgia 04/14/2017  . 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 Heart murmur 06/09/2015  . Low back pain 06/09/2015  . Fatigue 06/09/2015  . Hyperglycemia 06/09/2015  . Scrotal mass 06/09/2015  . Bladder neck obstruction 02/03/2015  . OSA (obstructive sleep apnea) 08/21/2013  . Sinusitis, chronic 08/12/2013  . Dyslipidemia 06/21/2013  . 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 Barrett esophagus 07/20/2012  . CAD (coronary artery disease) 07/19/2012  . Ingrowing toenail of left foot 07/06/2012  . Preop exam for internal medicine 06/18/2012  . Well adult exam 08/04/2011  . HAND PAIN 12/24/2010  . NEOPLASM OF UNCERTAIN BEHAVIOR OF SKIN 06/21/2010  . Acute sinusitis 06/21/2010  . ACTINIC KERATOSIS 06/21/2010  . CONTACT DERMATITIS DUE TO SOLVENTS 01/28/2010  . KNEE PAIN 12/21/2009  . Primary osteoarthritis of both hands 11/21/2008  . HERPES, GENITAL NOS 05/09/2008  . ONYCHOMYCOSIS 05/09/2008  . GERD 05/09/2008  . COLONIC POLYPS, HX OF 05/09/2008  Past Surgical History:  Procedure Laterality Date  . COLONOSCOPY    . JOINT REPLACEMENT  9/13   L TKR  . KNEE ARTHROSCOPY     Left  . LEFT HEART CATH AND CORONARY ANGIOGRAPHY N/A 08/31/2017   Procedure: LEFT HEART CATH AND CORONARY ANGIOGRAPHY;  Surgeon: Nelva Bush, MD;  Location: [facility];  Service: Cardiovascular;  Laterality: N/A;  . TOTAL KNEE ARTHROPLASTY  07/31/2012   Procedure: TOTAL KNEE ARTHROPLASTY;  Surgeon: Sydnee Cabal, MD;  Location: [facility];  Service: Orthopedics;  Laterality: Left;  . UMBILICAL HERNIA REPAIR  07-25-12   10 yrs ago  . UPPER GASTROINTESTINAL ENDOSCOPY          Home Medications     Prior to Admission medications   Medication Sig Start Date End Date Taking? Authorizing Provider  acetaminophen (TYLENOL) 500 MG tablet Take 500 mg by mouth every 6 (six) hours as needed for headache.   Yes [provider]  albuterol (VENTOLIN HFA) 108 (90 Base) MCG/ACT inhaler Inhale 1 puff into the lungs every 6 (six) hours as needed for wheezing or shortness of breath.   Yes [provider]  Alum Hydroxide-Mag Carbonate (GAVISCON PO) Take 1 tablet by mouth daily as needed (indigestion).   Yes [provider]  Ascorbic Acid (VITAMIN C) 1000 MG tablet Take 1,000 mg by mouth daily.   Yes [provider]  aspirin 81 MG tablet Take 81 mg by mouth at bedtime.    Yes [provider]  cholecalciferol (VITAMIN D) 1000 units tablet Take 1,000 Units by mouth daily.   Yes [provider]  Clotrimazole (LOTRIMIN AF JOCK ITCH EX) Apply 1 application topically daily as needed (sweat).   Yes [provider]  Cyanocobalamin (VITAMIN B-12) 2500 MCG SUBL Place 5,000 mcg under the tongue daily.   Yes [provider]  escitalopram (LEXAPRO) 10 MG tablet Take 1 tablet by mouth once daily 01/29/19  Yes Plotnikov, Evie Lacks, MD  fluticasone (FLONASE) 50 MCG/ACT nasal spray Use 1 spray(s) in each nostril once daily Patient taking differently: Place 1 spray into both nostrils daily as needed for allergies or rhinitis.  01/29/19  Yes Plotnikov, Evie Lacks, MD  gabapentin (NEURONTIN) 100 MG capsule Take 1-2 capsules (100-200 mg total) by mouth 3 (three) times daily as needed (pain or ringing in the ears). 04/15/19  Yes Plotnikov, Evie Lacks, MD  Ginger, Zingiber officinalis, (GINGER ROOT PO) Take by mouth.   Yes [provider]  loratadine (CLARITIN) 10 MG tablet Take 1 tablet (10 mg total) by mouth daily. Patient taking differently: Take 10 mg by mouth daily as needed for allergies.  07/27/18 10/04/19 Yes Plotnikov, Evie Lacks, MD  meloxicam (MOBIC)  7.5 MG tablet Take 2 tablets by mouth once daily Patient taking differently: Take 15 mg by mouth daily.  07/12/19  Yes Plotnikov, Evie Lacks, MD  Misc Natural Products (TART CHERRY ADVANCED PO) Take by mouth.   Yes [provider]  Multiple Vitamin (MULTIVITAMIN WITH MINERALS) TABS tablet Take 1 tablet by mouth daily.   Yes [provider]  nitroGLYCERIN (NITROSTAT) 0.4 MG SL tablet Place 1 tablet (0.4 mg total) under the tongue every 5 (five) minutes as needed for chest pain. 08/29/17 10/04/19 Yes Revankar, Reita Cliche, MD  ondansetron (ZOFRAN) 4 MG tablet TAKE 1 TO 2 TABLETS BY MOUTH EVERY 4 TO 6 HOURS AS NEEDED FOR NAUSEA;  Patient needs office visit for further refills Patient taking differently: Take 4-8 mg  by mouth every 4 (four) hours as needed for nausea.  06/03/19  Yes Irene Shipper, MD  OVER THE COUNTER MEDICATION Flax Seed Oil, One capsule daily.   Yes [provider]  OVER THE COUNTER MEDICATION Fish Oil , one capsule daily.   Yes [provider]  pantoprazole (PROTONIX) 40 MG tablet TAKE 1 TABLET BY MOUTH TWICE DAILY Patient taking differently: Take 40 mg by mouth 2 (two) times daily.  10/09/18  Yes Plotnikov, Evie Lacks, MD  Red Yeast Rice 600 MG TABS Take 1,200 mg by mouth daily.   Yes [provider]  traMADol (ULTRAM) 50 MG tablet TAKE 1 TO 2 TABLETS BY MOUTH THREE TIMES DAILY AS NEEDED Patient taking differently: Take 100 mg by mouth daily as needed for moderate pain.  10/03/19  Yes Plotnikov, Evie Lacks, MD  triamcinolone ointment (KENALOG) 0.5 % APPLY OINTMENT TOPICALLY TWICE DAILY Patient taking differently: Apply 1 application topically 2 (two) times daily.  09/13/18  Yes Plotnikov, Evie Lacks, MD  Turmeric 500 MG TABS Take 500 mg by mouth 2 (two) times daily.   Yes [provider]  valACYclovir (VALTREX) 500 MG tablet Take 1 tablet by mouth once daily 07/08/19  Yes Plotnikov, Evie Lacks, MD    Family History Family History   Problem Relation Age of Onset  . Diabetes Mother 60  . Heart disease Father 33       CAD, MI, CHF  . Lung cancer Brother        smoker  . Coronary artery disease Other   . Colon polyps Brother   . Heart disease Brother 17       CHF  . Colon cancer Brother   . Healthy Daughter   . Stomach cancer Neg Hx   . Rectal cancer Neg Hx   . Esophageal cancer Neg Hx     Social History Social History   Tobacco Use  . Smoking status: Former Smoker    Types: Cigars    Quit date: 08/30/2007    Years since quitting: 12.1  . Smokeless tobacco: Never Used  . Tobacco comment: occ. cigar  Substance Use Topics  . Alcohol use: Yes    Comment: occ.   . Drug use: Yes    Types: Marijuana    Comment: Marijuana occ     Allergies   Hytrin [terazosin], Levaquin [levofloxacin in d5w], Lipitor [atorvastatin], Penicillins, Reglan [metoclopramide], and Xanax [alprazolam]   Review of Systems Review of Systems  Constitutional: Negative for diaphoresis and fever.  Respiratory: Positive for shortness of breath. Negative for cough.   Cardiovascular: Positive for chest pain. Negative for leg swelling.  Gastrointestinal: Negative for abdominal pain, nausea and vomiting.  Neurological: Positive for dizziness.  All other systems reviewed and are negative.    Physical Exam Updated Vital Signs BP (!) 132/93   Pulse 77   Temp 98.4 F (36.9 C)   Resp 17   Ht 6' (1.829 m)   Wt 105.2 kg   SpO2 98%   BMI 31.46 kg/m   Physical Exam Vitals signs and nursing note reviewed.  Constitutional:      Appearance: He is well-developed. He is obese.  HENT:     Head: Normocephalic and atraumatic.     Right Ear: External ear normal.     Left Ear: External ear normal.     Nose: Nose normal.  Eyes:     General:        Right eye: No discharge.  Left eye: No discharge.  Neck:     Musculoskeletal: Neck supple.  Cardiovascular:     Rate and Rhythm: Normal rate and regular rhythm.     Heart sounds:  Normal heart sounds.  Pulmonary:     Effort: Pulmonary effort is normal.     Breath sounds: Normal breath sounds.  Abdominal:     Palpations: Abdomen is soft.     Tenderness: There is abdominal tenderness.     Comments: Patient has some hard to localize abdominal tenderness in the upper abdomen.  Skin:    General: Skin is warm and dry.  Neurological:     Mental Status: He is alert.  Psychiatric:        Mood and Affect: Mood is not anxious.      ED Treatments / Results  Labs (all labs ordered are listed, but only abnormal results are displayed) Labs Reviewed  COMPREHENSIVE METABOLIC PANEL - Abnormal; Notable for the following components:      Result Value   Glucose, Bld 119 (*)    ALT 48 (*)    All other components within normal limits  CBC  LIPASE, BLOOD  TROPONIN I (HIGH SENSITIVITY)  TROPONIN I (HIGH SENSITIVITY)    EKG EKG Interpretation  Date/Time:  Friday October 04 2019 09:43:22 EST Ventricular Rate:  85 PR Interval:    QRS Duration: 94 QT Interval:  364 QTC Calculation: 433 R Axis:   -12 Text Interpretation: Sinus rhythm Consider anterior infarct no significant change since 2018 Confirmed by Sherwood Gambler [phone]) on 10/04/2019 9:47:47 AM   Radiology Dg Chest 2 View  Result Date: 10/04/2019 CLINICAL DATA:  Chest pain and shortness of breath associated with dizziness for 3 days, worsening, sternal chest pressure, former smoker EXAM: CHEST - 2 VIEW COMPARISON:  08/21/2017 Correlation: CT angio chest 11/12/2015 FINDINGS: Normal heart size, mediastinal contours, and pulmonary vascularity. Lungs clear. No pulmonary infiltrate, pleural effusion or pneumothorax. Bone island at anterior RIGHT fifth rib unchanged since CT. No acute osseous findings. IMPRESSION: No acute abnormalities. Electronically Signed   By: Lavonia Dana M.D.   On: 10/04/2019 10:14   US Abdomen Limited Ruq  Result Date: 10/04/2019 CLINICAL DATA:  Right upper quadrant pain for 1 day. EXAM:  ULTRASOUND ABDOMEN LIMITED RIGHT UPPER QUADRANT COMPARISON:  None. FINDINGS: Gallbladder: No gallstones or wall thickening visualized. No sonographic Murphy sign noted by sonographer. Common bile duct: Diameter: 2 mm, within normal limits. Liver: Diffusely increased in echogenicity. Portal vein is patent on color Doppler imaging with normal direction of blood flow towards the liver. Other: None. IMPRESSION: 1. No acute findings. 2. Hepatic steatosis. Electronically Signed   By: Lorin Picket M.D.   On: 10/04/2019 12:49    Procedures Procedures (including critical care time)  Medications Ordered in ED Medications  nitroGLYCERIN (NITROSTAT) SL tablet 0.4 mg (has no administration in time range)  sodium chloride flush (NS) 0.9 % injection 3 mL (3 mLs Intravenous Given 10/04/19 1216)  aspirin chewable tablet 324 mg (324 mg Oral Given 10/04/19 1030)     Initial Impression / Assessment and Plan / ED Course  I have reviewed the triage vital signs and the nursing notes.  Pertinent labs & imaging results that were available during my care of the patient were reviewed by me and considered in my medical decision making (see chart for details).     HEAR Score: 5  Patient has intermittent chest pain that sounds somewhat atypical.  No exertional component.  Initial work-up  is pretty unremarkable including troponins negative x2.  Some upper abdominal discomfort so gallbladder ultrasound and lipase/LFTs obtained but these are unremarkable as well.  The patient had a catheterization 2 years ago that showed mild nonobstructive disease.  I discussed options with patient given his heart score of 5 and he prefers to go home.  I think this is pretty reasonable with close outpatient follow-up with cardiology.  We discussed return precautions.  Highly doubt PE or dissection.  Final Clinical Impressions(s) / ED Diagnoses   Final diagnoses:  Upper abdominal pain  Nonspecific chest pain    ED Discharge Orders     None       Sherwood Gambler, MD 10/04/19 1347

## 2019-10-04 NOTE — Discharge Instructions (Signed)
If you develop recurrent, continued, or worsening chest pain, shortness of breath, fever, vomiting, abdominal or back pain, or any other new/concerning symptoms then return to the ER for evaluation.  

## 2019-10-04 NOTE — ED Triage Notes (Signed)
Pt states he has had chest pain and shortness of breath, along with some dizziness for 3 days. Pt was seen earlier today at the [facility] and directed to come here for eval the patient states that he has sternal chest pressure. Pt states that he had been putting up a fence the other day and became extremely short winded. Denies any injury or trauma to the area. No known COVID contacts.

## 2019-10-07 ENCOUNTER — Ambulatory Visit (INDEPENDENT_AMBULATORY_CARE_PROVIDER_SITE_OTHER): Payer: Medicare HMO | Admitting: Family

## 2019-10-07 ENCOUNTER — Encounter: Payer: Self-pay | Admitting: Family

## 2019-10-07 ENCOUNTER — Other Ambulatory Visit: Payer: Self-pay

## 2019-10-07 VITALS — BP 134/78 | HR 89 | Ht 72.0 in | Wt 235.0 lb

## 2019-10-07 DIAGNOSIS — E785 Hyperlipidemia, unspecified: Secondary | ICD-10-CM | POA: Diagnosis not present

## 2019-10-07 DIAGNOSIS — I2510 Atherosclerotic heart disease of native coronary artery without angina pectoris: Secondary | ICD-10-CM | POA: Diagnosis not present

## 2019-10-07 DIAGNOSIS — E782 Mixed hyperlipidemia: Secondary | ICD-10-CM | POA: Diagnosis not present

## 2019-10-07 DIAGNOSIS — R030 Elevated blood-pressure reading, without diagnosis of hypertension: Secondary | ICD-10-CM | POA: Diagnosis not present

## 2019-10-07 DIAGNOSIS — R079 Chest pain, unspecified: Secondary | ICD-10-CM

## 2019-10-07 MED ORDER — EZETIMIBE 10 MG PO TABS: 10.0000 mg | ORAL_TABLET | Freq: Every day | ORAL | 1 refills | Status: DC

## 2019-10-07 NOTE — Progress Notes (Signed)
Office Visit    Patient Name: [name] Date of Encounter: 10/07/2019  Primary Care Provider:  Cassandria Anger, MD Primary Cardiologist:  Jenean Lindau, MD Electrophysiologist:  None   Chief Complaint    [name] is a 68 y.o. male with a hx of CAD, GERD, osteoarthritis presents today for follow up after ED visit for chest pain.   Past Medical History    Past Medical History:  Diagnosis Date   Actinic keratosis    Allergy    Colonic polyp    COLONIC POLYPS, HX OF    CONTACT DERMATITIS DUE TO SOLVENTS    DOE (dyspnea on exertion) 07-25-12   related to eating shrimp   GERD (gastroesophageal reflux disease)    HAND PAIN    Heart murmur    HERPES, GENITAL NOS    Hyperlipidemia    Ingrowing toenail of left foot 07-25-12   is resolved now 07-25-12   KNEE PAIN    Neoplasm of uncertain behavior of skin    OA (osteoarthritis) of knee    ONYCHOMYCOSIS    OSTEOARTHRITIS    Preop exam for internal medicine    SINUSITIS, ACUTE    Well adult exam    Past Surgical History:  Procedure Laterality Date   COLONOSCOPY     JOINT REPLACEMENT  9/13   L TKR   KNEE ARTHROSCOPY     Left   LEFT HEART CATH AND CORONARY ANGIOGRAPHY N/A 08/31/2017   Procedure: LEFT HEART CATH AND CORONARY ANGIOGRAPHY;  Surgeon: Nelva Bush, MD;  Location: [facility];  Service: Cardiovascular;  Laterality: N/A;   TOTAL KNEE ARTHROPLASTY  07/31/2012   Procedure: TOTAL KNEE ARTHROPLASTY;  Surgeon: Sydnee Cabal, MD;  Location: [facility];  Service: Orthopedics;  Laterality: Left;   UMBILICAL HERNIA REPAIR  07-25-12   10 yrs ago   UPPER GASTROINTESTINAL ENDOSCOPY      Allergies  Allergies  Allergen Reactions   Hytrin [Terazosin] Other (See Comments)    "Makes me jittery"   Levaquin [Levofloxacin In D5w]     Cramps    Lipitor [Atorvastatin]     myalgias   Penicillins Other (See Comments)    Convulsions Has patient had a PCN reaction causing  immediate rash, facial/tongue/throat swelling, SOB or lightheadedness with hypotension: No Has patient had a PCN reaction causing severe rash involving mucus membranes or skin necrosis: No Has patient had a PCN reaction that required hospitalization: Yes Has patient had a PCN reaction occurring within the last 10 years: No If all of the above answers are "NO", then may proceed with Cephalosporin use.    Reglan [Metoclopramide]     Jerking, pains, CP   Xanax [Alprazolam]     "drunk" feeling    History of Present Illness    [name] is a 68 y.o. male with a hx of mild nonobstructive CAD, DLD, allergic rhinitis, GERD, OA of the knee, anxiety, B12 deficiency last seen in 2018 by Dr. Geraldo Pitter. Previously worked up for chest pain and SOB 2018 with cardiac cath with 30% stenosis of LAD, LCx recommended for medical therapy.  Seen in the ED 10/04/19 for chest pain and shortness of breath with some dizziness. Troponin negative x2. Galbladder ultrasound and lipse/LFT were negative. CXR unremarkable.   He was building a fence early last week. Noted he was more short winded after completing project. The next day he had onset of a "ball of pressure" in his mid  chest. Did not radiate, occurred at rest. No noted aggravating or relieving factors. He also had eaten [place] and attributed to GERD.   After ED visit Friday, started using his Flonase and Claritin. Feels better every day with the Claritin and Flonase. Tells me his SOB and chest pain have resolved. He has been coughing up clear, white phlegm. Thinks his symptoms may have been congestion.  In regards to his previous chest pain workup 2 years ago tells me symptoms have resolved since he stopped Reglan. He was previously on this for many years. He attributes his previous chest pain to this medication.   Despite arthritis and degenerative disc he remains active. Recent projects include building a fence, digging a pond. He exercises 30-45 minutes  three times per week. Watches his 21 year old granddaughter everyday.   He does not smoke cigarettes. Endorses smoking marijuana occasionally.  EKGs/Labs/Other Studies Reviewed:   The following studies were reviewed today:  Cardiac cath 08/31/17 Conclusions: 1. Mild, non-obstructive coronary artery disease involving the LAD and LCx. 2. Normal left ventricular contraction. 3. Upper normal left ventricular filling pressure.   Recommendations: 1. Medical therapy and risk factor modification to prevent progression of CAD.    EKG: No EKG today. EKG independently reviewed 10/04/19 shows SR with poor R-wave progression leads V1-V4 and no acute ST/T wave changes. Stable when compared to previous EKG 2018.  Recent Labs: 10/04/2019: ALT 48; BUN 23; Creatinine, Ser 0.82; Hemoglobin 15.2; Platelets 230; Potassium 4.0; Sodium 138  Recent Lipid Panel    Component Value Date/Time   CHOL 235 (H) 04/25/2018 0942   TRIG 255.0 (H) 04/25/2018 0942   HDL 37.90 (L) 04/25/2018 0942   CHOLHDL 6 04/25/2018 0942   VLDL 51.0 (H) 04/25/2018 0942   LDLCALC 154 (H) 07/12/2017 0732   LDLDIRECT 151.0 04/25/2018 0942    Home Medications   Current Meds  Medication Sig   acetaminophen (TYLENOL) 500 MG tablet Take 500 mg by mouth every 6 (six) hours as needed for headache.   Alum Hydroxide-Mag Carbonate (GAVISCON PO) Take 1 tablet by mouth daily as needed (indigestion).   Ascorbic Acid (VITAMIN C) 1000 MG tablet Take 1,000 mg by mouth daily.   aspirin 81 MG tablet Take 81 mg by mouth at bedtime.    cholecalciferol (VITAMIN D) 1000 units tablet Take 1,000 Units by mouth daily.   Cyanocobalamin (VITAMIN B-12) 2500 MCG SUBL Place 5,000 mcg under the tongue daily.   escitalopram (LEXAPRO) 10 MG tablet Take 1 tablet by mouth once daily   fluticasone (FLONASE) 50 MCG/ACT nasal spray Use 1 spray(s) in each nostril once daily (Patient taking differently: Place 1 spray into both nostrils daily as needed for  allergies or rhinitis. )   Ginger, Zingiber officinalis, (GINGER ROOT PO) Take by mouth.   meloxicam (MOBIC) 7.5 MG tablet Take 2 tablets by mouth once daily (Patient taking differently: Take 15 mg by mouth daily. )   Misc Natural Products (TART CHERRY ADVANCED PO) Take by mouth.   Multiple Vitamin (MULTIVITAMIN WITH MINERALS) TABS tablet Take 1 tablet by mouth daily.   ondansetron (ZOFRAN) 4 MG tablet TAKE 1 TO 2 TABLETS BY MOUTH EVERY 4 TO 6 HOURS AS NEEDED FOR NAUSEA;  Patient needs office visit for further refills (Patient taking differently: Take 4-8 mg by mouth every 4 (four) hours as needed for nausea. )   pantoprazole (PROTONIX) 40 MG tablet TAKE 1 TABLET BY MOUTH TWICE DAILY (Patient taking differently: Take 40 mg by mouth  2 (two) times daily. )      Review of Systems    Review of Systems  Constitution: Negative for chills, fever and malaise/fatigue.  Cardiovascular: Positive for chest pain ("improved since ED visit"). Negative for dyspnea on exertion, leg swelling, near-syncope, orthopnea and palpitations.  Respiratory: Positive for shortness of breath ("improved since ED visit") and sputum production. Negative for cough and wheezing.   Gastrointestinal: Negative for nausea and vomiting.  Neurological: Negative for dizziness, light-headedness and weakness.   All other systems reviewed and are otherwise negative except as noted above.  Physical Exam    VS:  BP 134/78 (BP Location: Left Arm, Patient Position: Sitting, Cuff Size: Normal)    Pulse 89    Ht 6' (1.829 m)    Wt 235 lb (106.6 kg)    SpO2 97%    BMI 31.87 kg/m  , BMI Body mass index is 31.87 kg/m. GEN: Well nourished, well developed, in no acute distress. HEENT: normal. Neck: Supple, no JVD, carotid bruits, or masses. Cardiac: RRR, no rubs, or gallops. 2/6 systolic murmur apex. No clubbing, cyanosis, edema.  Radials/DP/PT 2+ and equal bilaterally.  Respiratory:  Respirations regular and unlabored, clear to  auscultation bilaterally. GI: Soft, nontender, nondistended, BS + x 4. MS: No deformity or atrophy. Skin: Warm and dry, no rash. Neuro:  Strength and sensation are intact. Psych: Normal affect.  Assessment & Plan    1. Chest pain in adult - ED visit 10/04/19 negative troponin x2, CXR unremarkable, EKG with SR and stable poor R wave progression V1-V4 no change from 2018. Chest pain occurred the day after building a fence. Chest pain was atypical for angina as occurred at rest. Has since resolved with rest and Flonase/Claritin. Was likely costochondral pain from exertion. He has had no recurrent chest pain nor shortness of breath. No indication for further ischemic evaluation.   2. CAD - Cath 2018 with 30% stenosis LAD and LCx. No indication for ischemic evaluation at this time. GDMT includes aspirin and PRN nitroglycerin. Discussed addition of statin, but will add Zetia as below. He is hesitant regarding new medications. Discussed lifestyle recommendations including regular exercise and low sodium, heart healthy diet.   3. Elevated BP reading without diagnosis of HTN - Does not check routinely at home. BP today 134/78, ED 132/93, visit with Dr. Alain Marion 07/25/19 150/88. May require anti-hypertensive therapy. Asked him to keep an eye on his BP. Tells me it has "always been fine". Would consider beta blocker such as Coreg in setting of CAD.   4. DLD - Lipid panel 04/2018. In setting of nonobstructive CAD would recommend goal LDL <70.  Lipid panel, direct LDL today. Takes Red Yeast Rice Extract. He is hesitant regarding statin due to hx of myalgia with Atorvastatin. Suggested Crestor as typically fewer side effects. He is agreeable to start Zetia 10mg  daily and will recheck labs at January visit with his PCP.  Disposition: Follow up in 6 month(s) with Dr. Moss Mc, NP 10/07/2019, 9:20 AM

## 2019-10-07 NOTE — Patient Instructions (Addendum)
Medication Instructions:  Your physician has recommended you make the following change in your medication:   START Zetia 10mg  (one tablet) daily  *If you need a refill on your cardiac medications before your next appointment, please call your pharmacy*  Lab Work: Your physician recommends that you return for lab work today: lipid profile, direct LDL  If you have labs (blood work) drawn today and your tests are completely normal, you will receive your results only by: [name] MyChart Message (if you have MyChart) OR . A paper copy in the mail If you have any lab test that is abnormal or we need to change your treatment, we will call you to review the results.  Testing/Procedures: None today.  Follow-Up: At [facility], you and your health needs are our priority.  As part of our continuing mission to provide you with exceptional heart care, we have created designated Provider Care Teams.  These Care Teams include your primary Cardiologist (physician) and Advanced Practice Providers (APPs -  Physician Assistants and Nurse Practitioners) who all work together to provide you with the care you need, when you need it.  Your next appointment:   6 month(s)  The format for your next appointment:   In Person  Provider:   You may see Jenean Lindau, MD or the following Advanced Practice Provider on your designated Care Team:    Laurann Montana, FNP  Other Instructions  Ezetimibe Tablets What is this medicine? EZETIMIBE (ez ET i mibe) blocks the absorption of cholesterol from the stomach. It can help lower blood cholesterol for patients who are at risk of getting heart disease or a stroke. It is only for patients whose cholesterol level is not controlled by diet. This medicine may be used for other purposes; ask your health care provider or pharmacist if you have questions. COMMON BRAND NAME(S): Zetia What should I tell my health care provider before I take this medicine? They need to know  if you have any of these conditions:  liver disease  an unusual or allergic reaction to ezetimibe, medicines, foods, dyes, or preservatives  pregnant or trying to get pregnant  breast-feeding How should I use this medicine? Take this medicine by mouth with a glass of water. Follow the directions on the prescription label. This medicine can be taken with or without food. Take your doses at regular intervals. Do not take your medicine more often than directed. Talk to your pediatrician regarding the use of this medicine in children. Special care may be needed. Overdosage: If you think you have taken too much of this medicine contact a poison control center or emergency room at once. NOTE: This medicine is only for you. Do not share this medicine with others. What if I miss a dose? If you miss a dose, take it as soon as you can. If it is almost time for your next dose, take only that dose. Do not take double or extra doses. What may interact with this medicine? Do not take this medicine with any of the following medications:  fenofibrate  gemfibrozil This medicine may also interact with the following medications:  antacids  cyclosporine  herbal medicines like red yeast rice  other medicines to lower cholesterol or triglycerides This list may not describe all possible interactions. Give your health care provider a list of all the medicines, herbs, non-prescription drugs, or dietary supplements you use. Also tell them if you smoke, drink alcohol, or use illegal drugs. Some items may interact with  your medicine. What should I watch for while using this medicine? Visit your doctor or health care professional for regular checks on your progress. You will need to have your cholesterol levels checked. If you are also taking some other cholesterol medicines, you will also need to have tests to make sure your liver is working properly. Tell your doctor or health care professional if you get any  unexplained muscle pain, tenderness, or weakness, especially if you also have a fever and tiredness. You need to follow a low-cholesterol, low-fat diet while you are taking this medicine. This will decrease your risk of getting heart and blood vessel disease. Exercising and avoiding alcohol and smoking can also help. Ask your doctor or dietician for advice. What side effects may I notice from receiving this medicine? Side effects that you should report to your doctor or health care professional as soon as possible:  allergic reactions like skin rash, itching or hives, swelling of the face, lips, or tongue  dark yellow or brown urine  unusually weak or tired  yellowing of the skin or eyes Side effects that usually do not require medical attention (report to your doctor or health care professional if they continue or are bothersome):  diarrhea  dizziness  headache  stomach upset or pain This list may not describe all possible side effects. Call your doctor for medical advice about side effects. You may report side effects to FDA at 1-800-FDA-1088. Where should I keep my medicine? Keep out of the reach of children. Store at room temperature between 15 and 30 degrees C (59 and 86 degrees F). Protect from moisture. Keep container tightly closed. Throw away any unused medicine after the expiration date. NOTE: This sheet is a summary. It may not cover all possible information. If you have questions about this medicine, talk to your doctor, pharmacist, or health care provider.  2020 Elsevier/Gold Standard (2012-05-07 15:39:09)   Coronary Artery Disease, Male Coronary artery disease (CAD) is a condition in which the arteries that lead to the heart (coronary arteries) become narrow or blocked. The narrowing or blockage can lead to decreased blood flow to the heart. Prolonged reduced blood flow can cause a heart attack (myocardial infarction or MI). This condition may also be called coronary  heart disease. Because CAD is the leading cause of death in men, it is important to understand what causes this condition and how it is treated. What are the causes? CAD is most often caused by atherosclerosis. This is the buildup of fat and cholesterol (plaque) on the inside of the arteries. Over time, the plaque may narrow or block the artery, reducing blood flow to the heart. Plaque can also become weak and break off within a coronary artery and cause a sudden blockage. Other less common causes of CAD include:  A blood clot or a piece of a blood clot or other substance that blocks the flow of blood in a coronary artery (embolism).  A tearing of the artery (spontaneous coronary artery dissection).  An enlargement of an artery (aneurysm).  Inflammation (vasculitis) in the artery wall. What increases the risk? The following factors may make you more likely to develop this condition:  Age. Men over age 12 are at a greater risk of CAD.  Family history of CAD.  Gender. Men often develop CAD earlier in life than women.  High blood pressure (hypertension).  Diabetes.  High cholesterol levels.  Tobacco use.  Excessive alcohol use.  Lack of exercise.  A diet  high in saturated and trans fats, such as fried food and processed meat. Other possible risk factors include:  High stress levels.  Depression.  Obesity.  Sleep apnea. What are the signs or symptoms? Many people do not have any symptoms during the early stages of CAD. As the condition progresses, symptoms may include:  Chest pain (angina). The pain can: ? Feel like crushing or squeezing, or like a tightness, pressure, fullness, or heaviness in the chest. ? Last more than a few minutes or can stop and recur. The pain tends to get worse with exercise or stress and to fade with rest.  Pain in the arms, neck, jaw, ear, or back.  Unexplained heartburn or indigestion.  Shortness of breath.  Nausea or vomiting.  Sudden  light-headedness.  Sudden cold sweats.  Fluttering or fast heartbeat (palpitations). How is this diagnosed? This condition is diagnosed based on:  Your family and medical history.  A physical exam.  Tests, including: ? A test to check the electrical signals in your heart (electrocardiogram). ? Exercise stress test. This looks for signs of blockage when the heart is stressed with exercise, such as running on a treadmill. ? Pharmacologic stress test. This test looks for signs of blockage when the heart is being stressed with a medicine. ? Blood tests. ? Coronary angiogram. This is a procedure to look at the coronary arteries to see if there is any blockage. During this test, a dye is injected into your arteries so they appear on an X-ray. ? Coronary artery CT scan. This CT scan helps detect calcium deposits in your coronary arteries. Calcium deposits are an indicator of CAD. ? A test that uses sound waves to take a picture of your heart (echocardiogram). ? Chest X-ray. How is this treated? This condition may be treated by:  Healthy lifestyle changes to reduce risk factors.  Medicines such as: ? Antiplatelet medicines and blood-thinning medicines, such as aspirin. These help to prevent blood clots. ? Nitroglycerin. ? Blood pressure medicines. ? Cholesterol-lowering medicine.  Coronary angioplasty and stenting. During this procedure, a thin, flexible tube is inserted through a blood vessel and into a blocked artery. A balloon or similar device on the end of the tube is inflated to open up the artery. In some cases, a small, mesh tube (stent) is inserted into the artery to keep it open.  Coronary artery bypass surgery. During this surgery, veins or arteries from other parts of the body are used to create a bypass around the blockage and allow blood to reach your heart. Follow these instructions at home: Medicines  Take over-the-counter and prescription medicines only as told by your  health care provider.  Do not take the following medicines unless your health care provider approves: ? NSAIDs, such as ibuprofen, naproxen, or celecoxib. ? Vitamin supplements that contain vitamin A, vitamin E, or both. Lifestyle  Follow an exercise program approved by your health care provider. Aim for 150 minutes of moderate exercise or 75 minutes of vigorous exercise each week.  Maintain a healthy weight or lose weight as approved by your health care provider.  Learn to manage stress or try to limit your stress. Ask your health care provider for suggestions if you need help.  Get screened for depression and seek treatment, if needed.  Do not use any products that contain nicotine or tobacco, such as cigarettes, e-cigarettes, and chewing tobacco. If you need help quitting, ask your health care provider.  Do not use illegal drugs. Eating  and drinking   Follow a heart-healthy diet. A dietitian can help educate you about healthy food options and changes. In general, eat plenty of fruits and vegetables, lean meats, and whole grains.  Avoid foods high in: ? Sugar. ? Salt (sodium). ? Saturated fat, such as processed or fatty meat. ? Trans fat, such as fried foods.  Use healthy cooking methods such as roasting, grilling, broiling, baking, poaching, steaming, or stir-frying.  Do not drink alcohol if your health care provider tells you not to drink.  If you drink alcohol: ? Limit how much you have to 0-2 drinks per day. ? Be aware of how much alcohol is in your drink. In the [place] one drink equals one 12 oz bottle of beer (355 mL), one 5 oz glass of wine (148 mL), or one 1 oz glass of hard liquor (44 mL). General instructions  Manage any other health conditions, such as hypertension and diabetes. These conditions affect your heart.  Your health care provider may ask you to monitor your blood pressure. Ideally, your blood pressure should be below 130/80.  Keep all follow-up visits  as told by your health care provider. This is important. Get help right away if:  You have pain in your chest, neck, ear, arm, jaw, stomach, or back that: ? Lasts more than a few minutes. ? Is recurring. ? Is not relieved by taking medicine under your tongue (sublingual nitroglycerin).  You have profuse sweating without cause.  You have unexplained: ? Heartburn or indigestion. ? Shortness of breath or difficulty breathing. ? Fluttering or fast heartbeat (palpitations). ? Nausea or vomiting. ? Fatigue. ? Feelings of nervousness or anxiety. ? Weakness. ? Diarrhea.  You have sudden light-headedness or dizziness.  You faint.  You feel like hurting yourself or think about taking your own life. These symptoms may represent a serious problem that is an emergency. Do not wait to see if the symptoms will go away. Get medical help right away. Call your local emergency services (911 in the [place] Do not drive yourself to the hospital. Summary  Coronary artery disease (CAD) is a condition in which the arteries that lead to the heart (coronary arteries) become narrow or blocked. The narrowing or blockage can lead to a heart attack.  Many people do not have any symptoms during the early stages of CAD.  CAD can be treated with lifestyle changes, medicines, surgery, or a combination of these treatments. This information is not intended to replace advice given to you by your health care provider. Make sure you discuss any questions you have with your health care provider. Document Released: 05/28/2014 Document Revised: 07/20/2018 Document Reviewed: 07/10/2018 Elsevier Patient Education  2020 [employer].

## 2019-10-08 ENCOUNTER — Other Ambulatory Visit: Payer: Self-pay | Admitting: Internal Medicine

## 2019-10-08 LAB — LIPID PANEL
Chol/HDL Ratio: 6.2 ratio — ABNORMAL HIGH (ref 0.0–5.0)
Cholesterol, Total: 186 mg/dL (ref 100–199)
HDL: 30 mg/dL — ABNORMAL LOW (ref >39)
LDL Chol Calc (NIH): 94 mg/dL (ref 0–99)
Triglycerides: 369 mg/dL — ABNORMAL HIGH (ref 0–149)
VLDL Cholesterol Cal: 62 mg/dL — ABNORMAL HIGH (ref 5–40)

## 2019-10-08 LAB — LDL CHOLESTEROL, DIRECT: LDL Direct: 110 mg/dL — ABNORMAL HIGH (ref 0–99)

## 2019-10-14 ENCOUNTER — Other Ambulatory Visit: Payer: Self-pay | Admitting: Internal Medicine

## 2019-10-16 ENCOUNTER — Encounter: Payer: Self-pay | Admitting: Internal Medicine

## 2019-10-17 ENCOUNTER — Encounter: Payer: Self-pay | Admitting: Internal Medicine

## 2019-10-25 ENCOUNTER — Other Ambulatory Visit: Payer: Self-pay

## 2019-10-25 ENCOUNTER — Encounter: Payer: Self-pay | Admitting: Internal Medicine

## 2019-10-25 ENCOUNTER — Ambulatory Visit (AMBULATORY_SURGERY_CENTER): Payer: Medicare HMO | Admitting: *Deleted

## 2019-10-25 VITALS — Temp 96.2°F | Ht 72.0 in | Wt 237.0 lb

## 2019-10-25 DIAGNOSIS — Z86010 Personal history of colonic polyps: Secondary | ICD-10-CM

## 2019-10-25 DIAGNOSIS — Z1159 Encounter for screening for other viral diseases: Secondary | ICD-10-CM

## 2019-10-25 MED ORDER — NA SULFATE-K SULFATE-MG SULF 17.5-3.13-1.6 GM/177ML PO SOLN: ORAL | 0 refills | Status: DC

## 2019-10-25 NOTE — Progress Notes (Signed)
Patient is here in-person for PV. Patient denies any allergies to eggs or soy. Patient denies any problems with anesthesia/sedation. Patient denies any oxygen use at home. Patient denies taking any diet/weight loss medications or blood thinners. Patient is not being treated for MRSA or C-diff. EMMI education assisgned to the patient for the procedure, this was explained and instructions given to patient. COVID-19 screening test is on 12/21, the pt is aware. Pt is aware that care partner will wait in the car during procedure; if they feel like they will be too hot or cold to wait in the car; they may wait in the 4 th floor lobby. Patient is aware to bring only one care partner. We want them to wear a mask (we do not have any that we can provide them), practice social distancing, and we will check their temperatures when they get here.  I did remind the patient that their care partner needs to stay in the parking lot the entire time and have a cell phone available, we will call them when the pt is ready for discharge. Patient will wear mask into building.

## 2019-10-26 ENCOUNTER — Other Ambulatory Visit: Payer: Self-pay | Admitting: Internal Medicine

## 2019-10-26 MED ORDER — AZITHROMYCIN 250 MG PO TABS: ORAL_TABLET | ORAL | 0 refills | Status: DC

## 2019-11-04 ENCOUNTER — Ambulatory Visit (INDEPENDENT_AMBULATORY_CARE_PROVIDER_SITE_OTHER): Payer: Medicare HMO

## 2019-11-04 ENCOUNTER — Other Ambulatory Visit: Payer: Self-pay | Admitting: Internal Medicine

## 2019-11-04 DIAGNOSIS — Z1159 Encounter for screening for other viral diseases: Secondary | ICD-10-CM

## 2019-11-05 LAB — SARS CORONAVIRUS 2 (TAT 6-24 HRS): SARS Coronavirus 2: NEGATIVE

## 2019-11-07 ENCOUNTER — Encounter: Payer: Medicare HMO | Admitting: Internal Medicine

## 2019-11-09 ENCOUNTER — Other Ambulatory Visit: Payer: Self-pay | Admitting: Internal Medicine

## 2019-11-25 ENCOUNTER — Ambulatory Visit: Payer: Medicare HMO | Admitting: Internal Medicine

## 2019-11-25 ENCOUNTER — Encounter: Payer: Self-pay | Admitting: Internal Medicine

## 2019-11-25 ENCOUNTER — Other Ambulatory Visit: Payer: Self-pay

## 2019-11-25 ENCOUNTER — Ambulatory Visit (INDEPENDENT_AMBULATORY_CARE_PROVIDER_SITE_OTHER): Payer: Medicare HMO | Admitting: Internal Medicine

## 2019-11-25 DIAGNOSIS — E785 Hyperlipidemia, unspecified: Secondary | ICD-10-CM

## 2019-11-25 DIAGNOSIS — K2100 Gastro-esophageal reflux disease with esophagitis, without bleeding: Secondary | ICD-10-CM | POA: Diagnosis not present

## 2019-11-25 DIAGNOSIS — R739 Hyperglycemia, unspecified: Secondary | ICD-10-CM | POA: Diagnosis not present

## 2019-11-25 DIAGNOSIS — I2510 Atherosclerotic heart disease of native coronary artery without angina pectoris: Secondary | ICD-10-CM

## 2019-11-25 NOTE — Assessment & Plan Note (Signed)
Monitor labs 

## 2019-11-25 NOTE — Assessment & Plan Note (Addendum)
Worse On Zetia - c/o side effeects Repatha option discussed

## 2019-11-25 NOTE — Assessment & Plan Note (Addendum)
Zetia - causing pain too Krill oil

## 2019-11-25 NOTE — Progress Notes (Signed)
Subjective:  Patient ID: ALEK OKUMURA, male    DOB: 1951/01/15  Age: 69 y.o. MRN: [id number]  CC: No chief complaint on file.   HPI ONTERRIO RULEY presents for LBP, knee OA, CAD, GERD f/u  Outpatient Medications Prior to Visit  Medication Sig Dispense Refill  . acetaminophen (TYLENOL) 500 MG tablet Take 500 mg by mouth every 6 (six) hours as needed for headache.    . albuterol (VENTOLIN HFA) 108 (90 Base) MCG/ACT inhaler Inhale 1 puff into the lungs every 6 (six) hours as needed for wheezing or shortness of breath.    . Alum Hydroxide-Mag Carbonate (GAVISCON PO) Take 1 tablet by mouth daily as needed (indigestion).    . Ascorbic Acid (VITAMIN C) 1000 MG tablet Take 1,000 mg by mouth daily.    [name] aspirin 81 MG tablet Take 81 mg by mouth at bedtime.     [name] azithromycin (ZITHROMAX Z-PAK) 250 MG tablet As directed 6 tablet 0  . cholecalciferol (VITAMIN D) 1000 units tablet Take 1,000 Units by mouth daily.    . Clotrimazole (LOTRIMIN AF JOCK ITCH EX) Apply 1 application topically daily as needed (sweat).    . Cyanocobalamin (VITAMIN B-12) 2500 MCG SUBL Place 5,000 mcg under the tongue daily.    [name] escitalopram (LEXAPRO) 10 MG tablet Take 1 tablet by mouth once daily 90 tablet 3  . ezetimibe (ZETIA) 10 MG tablet Take 1 tablet (10 mg total) by mouth daily. 90 tablet 1  . fluticasone (FLONASE) 50 MCG/ACT nasal spray Use 1 spray(s) in each nostril once daily (Patient taking differently: Place 1 spray into both nostrils daily as needed for allergies or rhinitis. ) 48 g 3  . gabapentin (NEURONTIN) 100 MG capsule Take 1-2 capsules (100-200 mg total) by mouth 3 (three) times daily as needed (pain or ringing in the ears). 100 capsule 3  . Ginger, Zingiber officinalis, (GINGER ROOT PO) Take by mouth.    . meloxicam (MOBIC) 7.5 MG tablet Take 2 tablets by mouth once daily 180 tablet 0  . Misc Natural Products (TART CHERRY ADVANCED PO) Take by mouth.    . Multiple Vitamin (MULTIVITAMIN WITH MINERALS) TABS  tablet Take 1 tablet by mouth daily.    . Na Sulfate-K Sulfate-Mg Sulf 17.5-3.13-1.6 GM/177ML SOLN Suprep (no substitutions)-TAKE AS DIRECTED. 354 mL 0  . ondansetron (ZOFRAN) 4 MG tablet TAKE 1 TO 2 TABLETS BY MOUTH EVERY 4 TO 6 HOURS AS NEEDED FOR NAUSEA;  Patient needs office visit for further refills (Patient taking differently: Take 4-8 mg by mouth every 4 (four) hours as needed for nausea. ) 30 tablet 1  . OVER THE COUNTER MEDICATION Flax Seed Oil, One capsule daily.    [name] OVER THE COUNTER MEDICATION Fish Oil , one capsule daily.    . pantoprazole (PROTONIX) 40 MG tablet Take 1 tablet by mouth twice daily 180 tablet 0  . Red Yeast Rice 600 MG TABS Take 1,200 mg by mouth daily.    . traMADol (ULTRAM) 50 MG tablet TAKE 1 TO 2 TABLETS BY MOUTH THREE TIMES DAILY AS NEEDED (Patient taking differently: Take 100 mg by mouth daily as needed for moderate pain. ) 180 tablet 1  . triamcinolone ointment (KENALOG) 0.5 % APPLY OINTMENT TOPICALLY TWICE DAILY (Patient taking differently: Apply 1 application topically 2 (two) times daily. ) 45 g 2  . Turmeric 500 MG TABS Take 500 mg by mouth 2 (two) times daily.    . valACYclovir (VALTREX) 500 MG tablet  Take 1 tablet by mouth once daily 90 tablet 1  . loratadine (CLARITIN) 10 MG tablet Take 1 tablet (10 mg total) by mouth daily. (Patient taking differently: Take 10 mg by mouth daily as needed for allergies. ) 100 tablet 3  . nitroGLYCERIN (NITROSTAT) 0.4 MG SL tablet Place 1 tablet (0.4 mg total) under the tongue every 5 (five) minutes as needed for chest pain. 11 tablet 6   No facility-administered medications prior to visit.    ROS: Review of Systems  Constitutional: Positive for fatigue. Negative for appetite change and unexpected weight change.  HENT: Negative for congestion, nosebleeds, sneezing, sore throat and trouble swallowing.   Eyes: Negative for itching and visual disturbance.  Respiratory: Negative for cough.   Cardiovascular: Negative for  chest pain, palpitations and leg swelling.  Gastrointestinal: Negative for abdominal distention, blood in stool, diarrhea and nausea.  Genitourinary: Negative for frequency and hematuria.  Musculoskeletal: Positive for arthralgias, back pain and gait problem. Negative for joint swelling and neck pain.  Skin: Negative for rash.  Neurological: Negative for dizziness, tremors, speech difficulty and weakness.  Psychiatric/Behavioral: Positive for sleep disturbance. Negative for agitation and dysphoric mood. The patient is nervous/anxious.     Objective:  BP 138/82 (BP Location: Left Arm, Patient Position: Sitting, Cuff Size: Large)   Pulse 75   Temp 98.5 F (36.9 C) (Oral)   Ht 6' (1.829 m)   Wt 235 lb (106.6 kg)   SpO2 97%   BMI 31.87 kg/m   BP Readings from Last 3 Encounters:  11/25/19 138/82  10/07/19 134/78  10/04/19 (!) 132/93    Wt Readings from Last 3 Encounters:  11/25/19 235 lb (106.6 kg)  10/25/19 237 lb (107.5 kg)  10/07/19 235 lb (106.6 kg)    Physical Exam Constitutional:      General: He is not in acute distress.    Appearance: He is well-developed. He is obese.     Comments: NAD  Eyes:     Conjunctiva/sclera: Conjunctivae normal.     Pupils: Pupils are equal, round, and reactive to light.  Neck:     Thyroid: No thyromegaly.     Vascular: No JVD.  Cardiovascular:     Rate and Rhythm: Normal rate and regular rhythm.     Heart sounds: Normal heart sounds. No murmur. No friction rub. No gallop.   Pulmonary:     Effort: Pulmonary effort is normal. No respiratory distress.     Breath sounds: Normal breath sounds. No wheezing or rales.  Chest:     Chest wall: No tenderness.  Abdominal:     General: Bowel sounds are normal. There is no distension.     Palpations: Abdomen is soft. There is no mass.     Tenderness: There is no abdominal tenderness. There is no guarding or rebound.  Musculoskeletal:        General: Tenderness present. Normal range of motion.      Cervical back: Normal range of motion.  Lymphadenopathy:     Cervical: No cervical adenopathy.  Skin:    General: Skin is warm and dry.     Findings: No rash.  Neurological:     Mental Status: He is alert and oriented to person, place, and time.     Cranial Nerves: No cranial nerve deficit.     Motor: No abnormal muscle tone.     Coordination: Coordination normal.     Gait: Gait normal.     Deep Tendon Reflexes: Reflexes are  normal and symmetric.  Psychiatric:        Behavior: Behavior normal.        Thought Content: Thought content normal.        Judgment: Judgment normal.     Lab Results  Component Value Date   WBC 9.1 10/04/2019   HGB 15.2 10/04/2019   HCT 44.5 10/04/2019   PLT 230 10/04/2019   GLUCOSE 119 (H) 10/04/2019   CHOL 186 10/07/2019   TRIG 369 (H) 10/07/2019   HDL 30 (L) 10/07/2019   LDLDIRECT 110 (H) 10/07/2019   LDLCALC 94 10/07/2019   ALT 48 (H) 10/04/2019   AST 31 10/04/2019   NA 138 10/04/2019   K 4.0 10/04/2019   CL 105 10/04/2019   CREATININE 0.82 10/04/2019   BUN 23 10/04/2019   CO2 23 10/04/2019   TSH 3.82 09/05/2018   PSA 1.16 04/25/2018   INR 1.0 08/29/2017   HGBA1C 5.7 04/25/2018    DG Chest 2 View  Result Date: 10/04/2019 CLINICAL DATA:  Chest pain and shortness of breath associated with dizziness for 3 days, worsening, sternal chest pressure, former smoker EXAM: CHEST - 2 VIEW COMPARISON:  08/21/2017 Correlation: CT angio chest 11/12/2015 FINDINGS: Normal heart size, mediastinal contours, and pulmonary vascularity. Lungs clear. No pulmonary infiltrate, pleural effusion or pneumothorax. Bone island at anterior RIGHT fifth rib unchanged since CT. No acute osseous findings. IMPRESSION: No acute abnormalities. Electronically Signed   By: Lavonia Dana M.D.   On: 10/04/2019 10:14   US Abdomen Limited RUQ  Result Date: 10/04/2019 CLINICAL DATA:  Right upper quadrant pain for 1 day. EXAM: ULTRASOUND ABDOMEN LIMITED RIGHT UPPER QUADRANT  COMPARISON:  None. FINDINGS: Gallbladder: No gallstones or wall thickening visualized. No sonographic Murphy sign noted by sonographer. Common bile duct: Diameter: 2 mm, within normal limits. Liver: Diffusely increased in echogenicity. Portal vein is patent on color Doppler imaging with normal direction of blood flow towards the liver. Other: None. IMPRESSION: 1. No acute findings. 2. Hepatic steatosis. Electronically Signed   By: Lorin Picket M.D.   On: 10/04/2019 12:49    Assessment & Plan:   There are no diagnoses linked to this encounter.   No orders of the defined types were placed in this encounter.    Follow-up: No follow-ups on file.  Walker Kehr, MD

## 2019-11-25 NOTE — Assessment & Plan Note (Signed)
Cont Protonix.  

## 2019-11-25 NOTE — Patient Instructions (Signed)
Evolocumab injection What is this medicine? EVOLOCUMAB (e voe LOK ue mab) is known as a PCSK9 inhibitor. It is used to lower the level of cholesterol in the blood. It may be used alone or in combination with other cholesterol-lowering drugs. This drug may also be used to reduce the risk of heart attack, stroke, and certain types of heart surgery in patients with heart disease. This medicine may be used for other purposes; ask your health care provider or pharmacist if you have questions. COMMON BRAND NAME(S): Repatha What should I tell my health care provider before I take this medicine? They need to know if you have any of these conditions:  an unusual or allergic reaction to evolocumab, other medicines, latex, foods, dyes, or preservatives  pregnant or trying to get pregnant  breast-feeding How should I use this medicine? This medicine is for injection under the skin. You will be taught how to prepare and give this medicine. Use exactly as directed. Take your medicine at regular intervals. Do not take your medicine more often than directed. It is important that you put your used needles and syringes in a special sharps container. Do not put them in a trash can. If you do not have a sharps container, call your pharmacist or health care provider to get one. Talk to your pediatrician regarding the use of this medicine in children. While this drug may be prescribed for children as young as 13 years for selected conditions, precautions do apply. Overdosage: If you think you have taken too much of this medicine contact a poison control center or emergency room at once. NOTE: This medicine is only for you. Do not share this medicine with others. What if I miss a dose? If you miss a dose, take it as soon as you can if there are more than 7 days until the next scheduled dose, or skip the missed dose and take the next dose according to your original schedule. Do not take double or extra doses. What may  interact with this medicine? Interactions are not expected. This list may not describe all possible interactions. Give your health care provider a list of all the medicines, herbs, non-prescription drugs, or dietary supplements you use. Also tell them if you smoke, drink alcohol, or use illegal drugs. Some items may interact with your medicine. What should I watch for while using this medicine? Visit your health care provider for regular checks on your progress. Tell your health care provider if your symptoms do not start to get better or if they get worse. You may need blood work done while you are taking this drug. Do not wear the on-body infuser during an MRI. What side effects may I notice from receiving this medicine? Side effects that you should report to your doctor or health care professional as soon as possible:  allergic reactions like skin rash, itching or hives, swelling of the face, lips, or tongue  signs and symptoms of high blood sugar such as dizziness; dry mouth; dry skin; fruity breath; nausea; stomach pain; increased hunger or thirst; increased urination  signs and symptoms of infection like fever or chills; cough; sore throat; pain or trouble passing urine Side effects that usually do not require medical attention (report to your doctor or health care professional if they continue or are bothersome):  diarrhea  nausea  muscle pain  pain, redness, or irritation at site where injected This list may not describe all possible side effects. Call your doctor for medical   advice about side effects. You may report side effects to FDA at 1-800-FDA-1088. Where should I keep my medicine? Keep out of the reach of children. You will be instructed on how to store this medicine. Throw away any unused medicine after the expiration date on the label. NOTE: This sheet is a summary. It may not cover all possible information. If you have questions about this medicine, talk to your doctor,  pharmacist, or health care provider.  2020 Elsevier/Gold Standard (2019-09-03 16:22:29)  

## 2019-11-26 ENCOUNTER — Other Ambulatory Visit: Payer: Self-pay | Admitting: Internal Medicine

## 2019-12-03 ENCOUNTER — Other Ambulatory Visit: Payer: Self-pay | Admitting: Internal Medicine

## 2019-12-03 ENCOUNTER — Ambulatory Visit (INDEPENDENT_AMBULATORY_CARE_PROVIDER_SITE_OTHER): Payer: Medicare HMO

## 2019-12-03 DIAGNOSIS — Z1159 Encounter for screening for other viral diseases: Secondary | ICD-10-CM | POA: Diagnosis not present

## 2019-12-03 LAB — SARS CORONAVIRUS 2 (TAT 6-24 HRS): SARS Coronavirus 2: NEGATIVE

## 2019-12-06 ENCOUNTER — Other Ambulatory Visit: Payer: Self-pay

## 2019-12-06 ENCOUNTER — Encounter: Payer: Self-pay | Admitting: Internal Medicine

## 2019-12-06 ENCOUNTER — Ambulatory Visit (AMBULATORY_SURGERY_CENTER): Payer: Medicare HMO | Admitting: Internal Medicine

## 2019-12-06 VITALS — BP 153/93 | HR 67 | Temp 98.3°F | Resp 14 | Ht 72.0 in | Wt 237.0 lb

## 2019-12-06 DIAGNOSIS — D126 Benign neoplasm of colon, unspecified: Secondary | ICD-10-CM

## 2019-12-06 DIAGNOSIS — Z86010 Personal history of colonic polyps: Secondary | ICD-10-CM | POA: Diagnosis not present

## 2019-12-06 DIAGNOSIS — K635 Polyp of colon: Secondary | ICD-10-CM | POA: Diagnosis not present

## 2019-12-06 DIAGNOSIS — D122 Benign neoplasm of ascending colon: Secondary | ICD-10-CM

## 2019-12-06 DIAGNOSIS — M1990 Unspecified osteoarthritis, unspecified site: Secondary | ICD-10-CM | POA: Diagnosis not present

## 2019-12-06 DIAGNOSIS — K219 Gastro-esophageal reflux disease without esophagitis: Secondary | ICD-10-CM | POA: Diagnosis not present

## 2019-12-06 MED ORDER — SODIUM CHLORIDE 0.9 % IV SOLN: 500.0000 mL | Freq: Once | INTRAVENOUS | Status: DC

## 2019-12-06 NOTE — Progress Notes (Signed)
Temperature taken by J.B., VS taken by D.T. 

## 2019-12-06 NOTE — Progress Notes (Signed)
Report to PACU, RN, vss, BBS= Clear.  

## 2019-12-06 NOTE — Patient Instructions (Signed)
Handout on polyps given to you today  Await pathology results   YOU HAD AN ENDOSCOPIC PROCEDURE TODAY AT THE [facility]:   Refer to the procedure report that was given to you for any specific questions about what was found during the examination.  If the procedure report does not answer your questions, please call your gastroenterologist to clarify.  If you requested that your care partner not be given the details of your procedure findings, then the procedure report has been included in a sealed envelope for you to review at your convenience later.  YOU SHOULD EXPECT: Some feelings of bloating in the abdomen. Passage of more gas than usual.  Walking can help get rid of the air that was put into your GI tract during the procedure and reduce the bloating. If you had a lower endoscopy (such as a colonoscopy or flexible sigmoidoscopy) you may notice spotting of blood in your stool or on the toilet paper. If you underwent a bowel prep for your procedure, you may not have a normal bowel movement for a few days.  Please Note:  You might notice some irritation and congestion in your nose or some drainage.  This is from the oxygen used during your procedure.  There is no need for concern and it should clear up in a day or so.  SYMPTOMS TO REPORT IMMEDIATELY:   Following lower endoscopy (colonoscopy or flexible sigmoidoscopy):  Excessive amounts of blood in the stool  Significant tenderness or worsening of abdominal pains  Swelling of the abdomen that is new, acute  Fever of 100F or higher  For urgent or emergent issues, a gastroenterologist can be reached at any hour by calling [phone].   DIET:  We do recommend a small meal at first, but then you may proceed to your regular diet.  Drink plenty of fluids but you should avoid alcoholic beverages for 24 hours.  ACTIVITY:  You should plan to take it easy for the rest of today and you should NOT DRIVE or use heavy machinery until  tomorrow (because of the sedation medicines used during the test).    FOLLOW UP: Our staff will call the number listed on your records 48-72 hours following your procedure to check on you and address any questions or concerns that you may have regarding the information given to you following your procedure. If we do not reach you, we will leave a message.  We will attempt to reach you two times.  During this call, we will ask if you have developed any symptoms of COVID 19. If you develop any symptoms (ie: fever, flu-like symptoms, shortness of breath, cough etc.) before then, please call [phone].  If you test positive for Covid 19 in the 2 weeks post procedure, please call and report this information to us.    If any biopsies were taken you will be contacted by phone or by letter within the next 1-3 weeks.  Please call us at [phone] if you have not heard about the biopsies in 3 weeks.    SIGNATURES/CONFIDENTIALITY: You and/or your care partner have signed paperwork which will be entered into your electronic medical record.  These signatures attest to the fact that that the information above on your After Visit Summary has been reviewed and is understood.  Full responsibility of the confidentiality of this discharge information lies with you and/or your care-partner.  

## 2019-12-06 NOTE — Progress Notes (Signed)
Called to room to assist during endoscopic procedure.  Patient ID and intended procedure confirmed with present staff. Received instructions for my participation in the procedure from the performing physician.  

## 2019-12-06 NOTE — Progress Notes (Signed)
Pt's states no medical or surgical changes since previsit or office visit. 

## 2019-12-06 NOTE — Op Note (Signed)
[facility] Patient Name: [name] Procedure Date: 12/06/2019 2:49 PM MRN: [id number] Endoscopist: Docia Chuck. Henrene Pastor , MD Age: 69 Referring MD:  Date of Birth: [birth year] Gender: Male Account #: [id number] Procedure:                Colonoscopy with biopsies; with cold snare                            polypectomy x 1 Indications:              High risk colon cancer surveillance: Personal                            history of adenoma (10 mm or greater in size), High                            risk colon cancer surveillance: Personal history of                            multiple (3 or more) adenomas. Previous examination                            June 2020 with 20 mm right colon lateral spreading                            tumor (adenoma) removed piecemeal and tattooed. Now                            for follow-up Medicines:                Monitored Anesthesia Care Procedure:                Pre-Anesthesia Assessment:                           - Prior to the procedure, a History and Physical                            was performed, and patient medications and                            allergies were reviewed. The patient's tolerance of                            previous anesthesia was also reviewed. The risks                            and benefits of the procedure and the sedation                            options and risks were discussed with the patient.                            All questions were answered, and informed consent  was obtained. Prior Anticoagulants: The patient has                            taken no previous anticoagulant or antiplatelet                            agents. ASA Grade Assessment: II - A patient with                            mild systemic disease. After reviewing the risks                            and benefits, the patient was deemed in                            satisfactory condition to undergo the  procedure.                           After obtaining informed consent, the colonoscope                            was passed under direct vision. Throughout the                            procedure, the patient's blood pressure, pulse, and                            oxygen saturations were monitored continuously. The                            Colonoscope was introduced through the anus and                            advanced to the the cecum, identified by                            appendiceal orifice and ileocecal valve. The                            ileocecal valve, appendiceal orifice, and rectum                            were photographed. The quality of the bowel                            preparation was excellent. The colonoscopy was                            performed without difficulty. The patient tolerated                            the procedure well. The bowel preparation used was  SUPREP via split dose instruction. Scope In: 2:57:07 PM Scope Out: 3:11:23 PM Scope Withdrawal Time: 0 hours 12 minutes 10 seconds  Total Procedure Duration: 0 hours 14 minutes 16 seconds  Findings:                 A 4 mm polyp was found in the ascending colon. The                            polyp was removed with a cold snare. Resection and                            retrieval were complete.                           The previous polypectomy site in the right colon                            was easily identified by the presence of a marking                            tattoo and scarring. Multiple biopsies of the                            central scar were taken. No no obvious residual                            adenoma. The entire examined colon appeared                            otherwise normal on direct and retroflexion views. Complications:            No immediate complications. Estimated blood loss:                            None. Estimated Blood Loss:      Estimated blood loss: none. Impression:               - One 4 mm polyp in the ascending colon, removed                            with a cold snare. Resected and retrieved.                           - No obvious residual tumor at prior resection                            site. Scar biopsied                           - The entire examined colon is otherwise normal on                            direct and retroflexion views. Recommendation:           - Repeat colonoscopy date to be determined after  pending pathology results are reviewed for                            surveillance.                           - Patient has a contact number available for                            emergencies. The signs and symptoms of potential                            delayed complications were discussed with the                            patient. Return to normal activities tomorrow.                            Written discharge instructions were provided to the                            patient.                           - Resume previous diet.                           - Continue present medications.                           - Await pathology results. Docia Chuck. Henrene Pastor, MD 12/06/2019 3:21:29 PM This report has been signed electronically.

## 2019-12-10 ENCOUNTER — Telehealth: Payer: Self-pay

## 2019-12-10 NOTE — Telephone Encounter (Signed)
  Follow up Call-  Call back number 12/06/2019 04/24/2019  Post procedure Call Back phone  # [phone]  Permission to leave phone message Yes Yes  Some recent data might be hidden     Patient questions:  Do you have a fever, pain , or abdominal swelling? No. Pain Score  0 *  Have you tolerated food without any problems? Yes.    Have you been able to return to your normal activities? Yes.    Do you have any questions about your discharge instructions: Diet   No. Medications  No. Follow up visit  No.  Do you have questions or concerns about your Care? No.  Actions: * If pain score is 4 or above: No action needed, pain <4.    1. Have you developed a fever since your procedure? No  2.   Have you had an respiratory symptoms (SOB or cough) since your procedure? No  3.   Have you tested positive for COVID 19 since your procedure? No  4.   Have you had any family members/close contacts diagnosed with the COVID 19 since your procedure?  No   If yes to any of these questions please route to Joylene John, RN and Alphonsa Gin, RN.

## 2019-12-11 ENCOUNTER — Encounter: Payer: Self-pay | Admitting: Internal Medicine

## 2019-12-13 ENCOUNTER — Other Ambulatory Visit: Payer: Self-pay | Admitting: Internal Medicine

## 2019-12-13 NOTE — Telephone Encounter (Signed)
Check [place] registry last filled 11/12/2019.Marland KitchenJohny Chess

## 2019-12-15 MED ORDER — TRAMADOL HCL 50 MG PO TABS: ORAL_TABLET | ORAL | 1 refills | Status: DC

## 2020-01-09 ENCOUNTER — Other Ambulatory Visit: Payer: Self-pay | Admitting: Internal Medicine

## 2020-01-16 ENCOUNTER — Ambulatory Visit: Payer: Medicare HMO | Attending: Internal Medicine

## 2020-01-16 DIAGNOSIS — Z23 Encounter for immunization: Secondary | ICD-10-CM | POA: Insufficient documentation

## 2020-01-16 NOTE — Progress Notes (Signed)
   Covid-19 Vaccination Clinic  Name:  RANVIR HALSEY    MRN: [id number] DOB: Mar 25, 1951  01/16/2020  Mr. Wixson was observed post Covid-19 immunization for 30 minutes based on pre-vaccination screening without incident. He was provided with Vaccine Information Sheet and instruction to access the V-Safe system.   Mr. Arrendale was instructed to call 911 with any severe reactions post vaccine: [name] Difficulty breathing  . Swelling of face and throat  . A fast heartbeat  . A bad rash all over body  . Dizziness and weakness

## 2020-01-20 ENCOUNTER — Other Ambulatory Visit: Payer: Self-pay | Admitting: Internal Medicine

## 2020-02-12 ENCOUNTER — Ambulatory Visit: Payer: Medicare HMO | Attending: Internal Medicine

## 2020-02-12 DIAGNOSIS — Z23 Encounter for immunization: Secondary | ICD-10-CM

## 2020-02-12 NOTE — Progress Notes (Signed)
   Covid-19 Vaccination Clinic  Name:  [name]    MRN: [id number] DOB: [birth year]  02/12/2020  [name] was observed post Covid-19 immunization for 15 minutes without incident. He was provided with Vaccine Information Sheet and instruction to access the V-Safe system.   [name] was instructed to call 911 with any severe reactions post vaccine: [name] Difficulty breathing  . Swelling of face and throat  . A fast heartbeat  . A bad rash all over body  . Dizziness and weakness   Immunizations Administered    Name Date Dose VIS Date Route   Pfizer COVID-19 Vaccine 02/12/2020  4:00 PM 0.3 mL 10/25/2019 Intramuscular   Manufacturer: [place]   Lot: [id number]   [place]: [id number]

## 2020-02-18 ENCOUNTER — Other Ambulatory Visit: Payer: Self-pay | Admitting: Internal Medicine

## 2020-02-19 ENCOUNTER — Other Ambulatory Visit: Payer: Self-pay | Admitting: Internal Medicine

## 2020-03-03 ENCOUNTER — Other Ambulatory Visit: Payer: Self-pay | Admitting: Internal Medicine

## 2020-03-23 ENCOUNTER — Ambulatory Visit: Payer: Medicare HMO | Admitting: Internal Medicine

## 2020-04-03 ENCOUNTER — Other Ambulatory Visit: Payer: Self-pay | Admitting: Internal Medicine

## 2020-05-06 ENCOUNTER — Ambulatory Visit (INDEPENDENT_AMBULATORY_CARE_PROVIDER_SITE_OTHER): Payer: Medicare HMO | Admitting: Internal Medicine

## 2020-05-06 ENCOUNTER — Other Ambulatory Visit: Payer: Self-pay

## 2020-05-06 ENCOUNTER — Encounter: Payer: Self-pay | Admitting: Internal Medicine

## 2020-05-06 DIAGNOSIS — E785 Hyperlipidemia, unspecified: Secondary | ICD-10-CM

## 2020-05-06 DIAGNOSIS — I2510 Atherosclerotic heart disease of native coronary artery without angina pectoris: Secondary | ICD-10-CM

## 2020-05-06 DIAGNOSIS — K22719 Barrett's esophagus with dysplasia, unspecified: Secondary | ICD-10-CM

## 2020-05-06 DIAGNOSIS — M25562 Pain in left knee: Secondary | ICD-10-CM

## 2020-05-06 DIAGNOSIS — M25561 Pain in right knee: Secondary | ICD-10-CM

## 2020-05-06 DIAGNOSIS — M5440 Lumbago with sciatica, unspecified side: Secondary | ICD-10-CM

## 2020-05-06 NOTE — Assessment & Plan Note (Signed)
Tramadol  Potential benefits of a long term opioids use as well as potential risks (i.e. addiction risk, apnea etc) and complications (i.e. Somnolence, constipation and others) were explained to the patient and were aknowledged.   

## 2020-05-06 NOTE — Assessment & Plan Note (Signed)
Protonix.  ?

## 2020-05-06 NOTE — Assessment & Plan Note (Signed)
Cardiology appt next week

## 2020-05-06 NOTE — Progress Notes (Signed)
Subjective:  Patient ID: [name], male    DOB: [birth year]  Age: 69 y.o. MRN: [id number]  CC: No chief complaint on file.   HPI [name] presents for OA, knee pain, GERD, palpitations f/u  Outpatient Medications Prior to Visit  Medication Sig Dispense Refill  . acetaminophen (TYLENOL) 500 MG tablet Take 500 mg by mouth every 6 (six) hours as needed for headache.    . albuterol (VENTOLIN HFA) 108 (90 Base) MCG/ACT inhaler Inhale 1 puff into the lungs every 6 (six) hours as needed for wheezing or shortness of breath.    . Alum Hydroxide-Mag Carbonate (GAVISCON PO) Take 1 tablet by mouth daily as needed (indigestion).    . Ascorbic Acid (VITAMIN C) 1000 MG tablet Take 1,000 mg by mouth daily.    [name] aspirin 81 MG tablet Take 81 mg by mouth at bedtime.     . cholecalciferol (VITAMIN D) 1000 units tablet Take 1,000 Units by mouth daily.    . Clotrimazole (LOTRIMIN AF JOCK ITCH EX) Apply 1 application topically daily as needed (sweat).    . Cyanocobalamin (VITAMIN B-12) 2500 MCG SUBL Place 5,000 mcg under the tongue daily.    . EQ LORATADINE 10 MG tablet TAKE 1 TABLET BY MOUTH ONCE DAILY AS NEEDED FOR  ALLERGIES 90 tablet 3  . escitalopram (LEXAPRO) 10 MG tablet Take 1 tablet by mouth once daily 90 tablet 1  . fluticasone (FLONASE) 50 MCG/ACT nasal spray Use 1 spray(s) in each nostril once daily 48 g 3  . Ginger, Zingiber officinalis, (GINGER ROOT PO) Take by mouth.    . meloxicam (MOBIC) 7.5 MG tablet Take 2 tablets by mouth once daily 180 tablet 1  . Misc Natural Products (TART CHERRY ADVANCED PO) Take by mouth.    . Multiple Vitamin (MULTIVITAMIN WITH MINERALS) TABS tablet Take 1 tablet by mouth daily.    . ondansetron (ZOFRAN) 4 MG tablet TAKE 1 TO 2 TABLETS BY MOUTH EVERY 4 TO 6 HOURS AS NEEDED FOR NAUSEA (PATIENT  NEEDS  OFFICE  VISIT  FOR  FURTHER  REFILLS) 30 tablet 2  . OVER THE COUNTER MEDICATION Flax Seed Oil, One capsule daily.    [name] OVER THE COUNTER MEDICATION Fish Oil ,  one capsule daily.    . pantoprazole (PROTONIX) 40 MG tablet Take 1 tablet by mouth twice daily 180 tablet 0  . Red Yeast Rice 600 MG TABS Take 1,200 mg by mouth daily.    . traMADol (ULTRAM) 50 MG tablet TAKE 1 TO 2 TABLETS BY MOUTH THREE TIMES DAILY AS NEEDED 180 tablet 1  . triamcinolone ointment (KENALOG) 0.5 % APPLY OINTMENT TOPICALLY TWICE DAILY 45 g 0  . Turmeric 500 MG TABS Take 500 mg by mouth 2 (two) times daily.    . valACYclovir (VALTREX) 500 MG tablet Take 1 tablet (500 mg total) by mouth daily. Annual appt due in Sept must see provider for future refills 90 tablet 1  . ezetimibe (ZETIA) 10 MG tablet Take 1 tablet (10 mg total) by mouth daily. 90 tablet 1  . gabapentin (NEURONTIN) 100 MG capsule Take 1-2 capsules (100-200 mg total) by mouth 3 (three) times daily as needed (pain or ringing in the ears). (Patient not taking: Reported on 05/06/2020) 100 capsule 3  . nitroGLYCERIN (NITROSTAT) 0.4 MG SL tablet Place 1 tablet (0.4 mg total) under the tongue every 5 (five) minutes as needed for chest pain. 11 tablet 6   No facility-administered medications prior  to visit.    ROS: Review of Systems  Constitutional: Negative for appetite change, fatigue and unexpected weight change.  HENT: Negative for congestion, nosebleeds, sneezing, sore throat and trouble swallowing.   Eyes: Negative for itching and visual disturbance.  Respiratory: Negative for cough.   Cardiovascular: Positive for palpitations. Negative for chest pain and leg swelling.  Gastrointestinal: Negative for abdominal distention, blood in stool, diarrhea and nausea.  Genitourinary: Negative for frequency and hematuria.  Musculoskeletal: Positive for arthralgias and gait problem. Negative for back pain, joint swelling and neck pain.  Skin: Negative for rash.  Neurological: Negative for dizziness, tremors, speech difficulty and weakness.  Psychiatric/Behavioral: Negative for agitation, dysphoric mood and sleep disturbance.  The patient is not nervous/anxious.     Objective:  BP 126/70 (BP Location: Left Arm, Patient Position: Sitting, Cuff Size: Large)   Pulse 77   Temp 98.5 F (36.9 C) (Oral)   Ht 6' (1.829 m)   Wt 235 lb (106.6 kg)   SpO2 96%   BMI 31.87 kg/m   BP Readings from Last 3 Encounters:  05/06/20 126/70  12/06/19 (!) 153/93  11/25/19 138/82    Wt Readings from Last 3 Encounters:  05/06/20 235 lb (106.6 kg)  12/06/19 237 lb (107.5 kg)  11/25/19 235 lb (106.6 kg)    Physical Exam Constitutional:      General: He is not in acute distress.    Appearance: He is well-developed.     Comments: NAD  Eyes:     Conjunctiva/sclera: Conjunctivae normal.     Pupils: Pupils are equal, round, and reactive to light.  Neck:     Thyroid: No thyromegaly.     Vascular: No JVD.  Cardiovascular:     Rate and Rhythm: Normal rate and regular rhythm.     Heart sounds: Normal heart sounds. No murmur heard.  No friction rub. No gallop.   Pulmonary:     Effort: Pulmonary effort is normal. No respiratory distress.     Breath sounds: Normal breath sounds. No wheezing or rales.  Chest:     Chest wall: No tenderness.  Abdominal:     General: Bowel sounds are normal. There is no distension.     Palpations: Abdomen is soft. There is no mass.     Tenderness: There is no abdominal tenderness. There is no guarding or rebound.  Musculoskeletal:        General: Tenderness present. Normal range of motion.     Cervical back: Normal range of motion.  Lymphadenopathy:     Cervical: No cervical adenopathy.  Skin:    General: Skin is warm and dry.     Findings: No rash.  Neurological:     Mental Status: He is alert and oriented to person, place, and time.     Cranial Nerves: No cranial nerve deficit.     Motor: No abnormal muscle tone.     Coordination: Coordination normal.     Gait: Gait normal.     Deep Tendon Reflexes: Reflexes are normal and symmetric.  Psychiatric:        Behavior: Behavior normal.         Thought Content: Thought content normal.        Judgment: Judgment normal.     Lab Results  Component Value Date   WBC 9.1 10/04/2019   HGB 15.2 10/04/2019   HCT 44.5 10/04/2019   PLT 230 10/04/2019   GLUCOSE 119 (H) 10/04/2019   CHOL 186 10/07/2019  TRIG 369 (H) 10/07/2019   HDL 30 (L) 10/07/2019   LDLDIRECT 110 (H) 10/07/2019   LDLCALC 94 10/07/2019   ALT 48 (H) 10/04/2019   AST 31 10/04/2019   NA 138 10/04/2019   K 4.0 10/04/2019   CL 105 10/04/2019   CREATININE 0.82 10/04/2019   BUN 23 10/04/2019   CO2 23 10/04/2019   TSH 3.82 09/05/2018   PSA 1.16 04/25/2018   INR 1.0 08/29/2017   HGBA1C 5.7 04/25/2018    DG Chest 2 View  Result Date: 10/04/2019 CLINICAL DATA:  Chest pain and shortness of breath associated with dizziness for 3 days, worsening, sternal chest pressure, former smoker EXAM: CHEST - 2 VIEW COMPARISON:  08/21/2017 Correlation: CT angio chest 11/12/2015 FINDINGS: Normal heart size, mediastinal contours, and pulmonary vascularity. Lungs clear. No pulmonary infiltrate, pleural effusion or pneumothorax. Bone island at anterior RIGHT fifth rib unchanged since CT. No acute osseous findings. IMPRESSION: No acute abnormalities. Electronically Signed   By: Lavonia Dana M.D.   On: 10/04/2019 10:14   US Abdomen Limited RUQ  Result Date: 10/04/2019 CLINICAL DATA:  Right upper quadrant pain for 1 day. EXAM: ULTRASOUND ABDOMEN LIMITED RIGHT UPPER QUADRANT COMPARISON:  None. FINDINGS: Gallbladder: No gallstones or wall thickening visualized. No sonographic Murphy sign noted by sonographer. Common bile duct: Diameter: 2 mm, within normal limits. Liver: Diffusely increased in echogenicity. Portal vein is patent on color Doppler imaging with normal direction of blood flow towards the liver. Other: None. IMPRESSION: 1. No acute findings. 2. Hepatic steatosis. Electronically Signed   By: Lorin Picket M.D.   On: 10/04/2019 12:49    Assessment & Plan:    Walker Kehr, MD

## 2020-05-13 ENCOUNTER — Ambulatory Visit: Payer: Medicare HMO | Admitting: Cardiology

## 2020-05-13 ENCOUNTER — Other Ambulatory Visit: Payer: Self-pay | Admitting: Internal Medicine

## 2020-05-13 ENCOUNTER — Other Ambulatory Visit: Payer: Self-pay

## 2020-05-13 ENCOUNTER — Encounter: Payer: Self-pay | Admitting: Cardiology

## 2020-05-13 VITALS — BP 124/80 | HR 84 | Ht 72.0 in | Wt 233.0 lb

## 2020-05-13 DIAGNOSIS — G4733 Obstructive sleep apnea (adult) (pediatric): Secondary | ICD-10-CM | POA: Diagnosis not present

## 2020-05-13 DIAGNOSIS — I2510 Atherosclerotic heart disease of native coronary artery without angina pectoris: Secondary | ICD-10-CM | POA: Diagnosis not present

## 2020-05-13 DIAGNOSIS — E785 Hyperlipidemia, unspecified: Secondary | ICD-10-CM

## 2020-05-13 MED ORDER — ICOSAPENT ETHYL 1 G PO CAPS: 2.0000 g | ORAL_CAPSULE | Freq: Two times a day (BID) | ORAL | 12 refills | Status: DC

## 2020-05-13 NOTE — Telephone Encounter (Signed)
[place] Controlled Database Checked Last filled:  04/13/20  # 180 LOV w/you:  05/06/20  Next appt w/you:  08/11/20

## 2020-05-13 NOTE — Progress Notes (Signed)
Cardiology Office Note:    Date:  05/13/2020   ID:  [name], DOB [birth year], MRN [id number]  PCP:  Cassandria Anger, MD  Cardiologist:  Jenean Lindau, MD   Referring MD: Cassandria Anger, MD    ASSESSMENT:    1. Coronary artery disease involving native coronary artery of native heart without angina pectoris   2. Dyslipidemia   3. OSA (obstructive sleep apnea)    PLAN:    In order of problems listed above:  1. Coronary artery disease: Secondary prevention stressed with the patient.  Importance of compliance with diet medication stressed and he vocalized understanding. 2. I told him to exercise at least 5 days a week at least half abnormality and he promises to do so. 3. Mixed dyslipidemia: Lipids were reviewed.  He is statin intolerant.  I will try Vascepa 2 g twice daily and will give him samples and a prescription.  Diet and exercise emphasized he is getting his blood work checked by his primary care September and he will send [place] a copy. 4. Obstructive sleep apnea: Sleep health issues were discussed.  Followed by primary care physicians.  Importance of sleep hygiene emphasized 5. Patient will be seen in follow-up appointment in 6 months or earlier if the patient has any concerns    Medication Adjustments/Labs and Tests Ordered: Current medicines are reviewed at length with the patient today.  Concerns regarding medicines are outlined above.  No orders of the defined types were placed in this encounter.  No orders of the defined types were placed in this encounter.    Chief Complaint  Patient presents with  . Follow-up     History of Present Illness:    [name] is a 69 y.o. male.  Patient has past medical history of nonobstructive coronary artery disease, mixed dyslipidemia and sleep apnea.  He denies any problems at this time and takes care of activities of daily living.  He also has abdominal obesity.  No chest pain orthopnea or PND.  He does try  to exercise on a regular basis 3-4 times a week.  He also has orthopedic issues affecting the knee.  At the time of my evaluation, the patient is alert awake oriented and in no distress.  Past Medical History:  Diagnosis Date  . Actinic keratosis   . Allergy   . Colonic polyp   . COLONIC POLYPS, HX OF   . CONTACT DERMATITIS DUE TO SOLVENTS   . DOE (dyspnea on exertion) 07-25-12   related to eating shrimp  . GERD (gastroesophageal reflux disease)   . HAND PAIN   . Heart murmur   . HERPES, GENITAL NOS   . Hyperlipidemia   . Ingrowing toenail of left foot 07-25-12   is resolved now 07-25-12  . KNEE PAIN   . Neoplasm of uncertain behavior of skin   . OA (osteoarthritis) of knee   . ONYCHOMYCOSIS   . OSTEOARTHRITIS   . Preop exam for internal medicine   . SINUSITIS, ACUTE   . Well adult exam     Past Surgical History:  Procedure Laterality Date  . COLONOSCOPY  04/24/2019  . JOINT REPLACEMENT  9/13   L TKR  . KNEE ARTHROSCOPY     Left  . LEFT HEART CATH AND CORONARY ANGIOGRAPHY N/A 08/31/2017   Procedure: LEFT HEART CATH AND CORONARY ANGIOGRAPHY;  Surgeon: Nelva Bush, MD;  Location: [facility];  Service: Cardiovascular;  Laterality: N/A;  .  TOTAL KNEE ARTHROPLASTY  07/31/2012   Procedure: TOTAL KNEE ARTHROPLASTY;  Surgeon: Sydnee Cabal, MD;  Location: [facility];  Service: Orthopedics;  Laterality: Left;  . UMBILICAL HERNIA REPAIR  07-25-12   10 yrs ago  . UPPER GASTROINTESTINAL ENDOSCOPY  04/24/2019    Current Medications: Current Meds  Medication Sig  . acetaminophen (TYLENOL) 500 MG tablet Take 500 mg by mouth every 6 (six) hours as needed for headache.  . Alum Hydroxide-Mag Carbonate (GAVISCON PO) Take 1 tablet by mouth daily as needed (indigestion).  . Ascorbic Acid (VITAMIN C) 1000 MG tablet Take 1,000 mg by mouth daily.  [name] aspirin 81 MG tablet Take 81 mg by mouth at bedtime.   . cholecalciferol (VITAMIN D) 1000 units tablet Take 1,000 Units by mouth daily.    . Clotrimazole (LOTRIMIN AF JOCK ITCH EX) Apply 1 application topically daily as needed (sweat).  . Cyanocobalamin (VITAMIN B-12) 2500 MCG SUBL Place 5,000 mcg under the tongue daily.  . EQ LORATADINE 10 MG tablet TAKE 1 TABLET BY MOUTH ONCE DAILY AS NEEDED FOR  ALLERGIES  . escitalopram (LEXAPRO) 10 MG tablet Take 1 tablet by mouth once daily  . ezetimibe (ZETIA) 10 MG tablet Take 1 tablet (10 mg total) by mouth daily.  . fluticasone (FLONASE) 50 MCG/ACT nasal spray Use 1 spray(s) in each nostril once daily  . Ginger, Zingiber officinalis, (GINGER ROOT PO) Take by mouth.  . meloxicam (MOBIC) 7.5 MG tablet Take 2 tablets by mouth once daily  . Misc Natural Products (TART CHERRY ADVANCED PO) Take by mouth.  . Multiple Vitamin (MULTIVITAMIN WITH MINERALS) TABS tablet Take 1 tablet by mouth daily.  . nitroGLYCERIN (NITROSTAT) 0.4 MG SL tablet Place 1 tablet (0.4 mg total) under the tongue every 5 (five) minutes as needed for chest pain.  [name] ondansetron (ZOFRAN) 4 MG tablet TAKE 1 TO 2 TABLETS BY MOUTH EVERY 4 TO 6 HOURS AS NEEDED FOR NAUSEA (PATIENT  NEEDS  OFFICE  VISIT  FOR  FURTHER  REFILLS)  . OVER THE COUNTER MEDICATION Flax Seed Oil, One capsule daily.  [name] OVER THE COUNTER MEDICATION Fish Oil , one capsule daily.  . pantoprazole (PROTONIX) 40 MG tablet Take 1 tablet by mouth twice daily  . Red Yeast Rice 600 MG TABS Take 1,200 mg by mouth daily.  . traMADol (ULTRAM) 50 MG tablet TAKE 1 TO 2 TABLETS BY MOUTH THREE TIMES DAILY AS NEEDED  . triamcinolone ointment (KENALOG) 0.5 % APPLY OINTMENT TOPICALLY TWICE DAILY  . Turmeric 500 MG TABS Take 500 mg by mouth 2 (two) times daily.  . valACYclovir (VALTREX) 500 MG tablet Take 1 tablet (500 mg total) by mouth daily. Annual appt due in Sept must see provider for future refills     Allergies:   Hytrin [terazosin], Levaquin [levofloxacin in d5w], Lipitor [atorvastatin], Penicillins, Reglan [metoclopramide], and Xanax [alprazolam]   Social History    Socioeconomic History  . Marital status: Married    Spouse name: Not on file  . Number of children: 2  . Years of education: Not on file  . Highest education level: Not on file  Occupational History  . Occupation: retired  Tobacco Use  . Smoking status: Former Smoker    Types: Cigars    Quit date: 08/30/2007    Years since quitting: 12.7  . Smokeless tobacco: Never Used  . Tobacco comment: occ. cigar  Vaping Use  . Vaping Use: Never used  Substance and Sexual Activity  . Alcohol use:  Yes    Comment: occ.   . Drug use: Yes    Types: Marijuana    Comment: last marijuana this am, 2 "hits" per patient  . Sexual activity: Yes    Partners: Female    Birth control/protection: None  Other Topics Concern  . Not on file  Social History Narrative  . Not on file   Social Determinants of Health   Financial Resource Strain:   . Difficulty of Paying Living Expenses:   Food Insecurity:   . Worried About Charity fundraiser in the Last Year:   . Arboriculturist in the Last Year:   Transportation Needs:   . Film/video editor (Medical):   [name] Lack of Transportation (Non-Medical):   Physical Activity:   . Days of Exercise per Week:   . Minutes of Exercise per Session:   Stress:   . Feeling of Stress :   Social Connections:   . Frequency of Communication with Friends and Family:   . Frequency of Social Gatherings with Friends and Family:   . Attends Religious Services:   . Active Member of Clubs or Organizations:   . Attends Archivist Meetings:   [name] Marital Status:      Family History: The patient's family history includes Colon cancer in his brother; Colon polyps in his brother; Coronary artery disease in an other family member; Diabetes (age of onset: 5) in his mother; Healthy in his daughter; Heart disease (age of onset: 22) in his father; Heart disease (age of onset: 48) in his brother; Lung cancer in his brother. There is no history of Stomach cancer, Rectal  cancer, or Esophageal cancer.  ROS:   Please see the history of present illness.    All other systems reviewed and are negative.  EKGs/Labs/Other Studies Reviewed:    The following studies were reviewed today: I discussed my findings with the patient at extensive length   Recent Labs: 10/04/2019: ALT 48; BUN 23; Creatinine, Ser 0.82; Hemoglobin 15.2; Platelets 230; Potassium 4.0; Sodium 138  Recent Lipid Panel    Component Value Date/Time   CHOL 186 10/07/2019 0910   TRIG 369 (H) 10/07/2019 0910   HDL 30 (L) 10/07/2019 0910   CHOLHDL 6.2 (H) 10/07/2019 0910   CHOLHDL 6 04/25/2018 0942   VLDL 51.0 (H) 04/25/2018 0942   LDLCALC 94 10/07/2019 0910   LDLDIRECT 110 (H) 10/07/2019 0910   LDLDIRECT 151.0 04/25/2018 0942    Physical Exam:    VS:  BP 124/80   Pulse 84   Ht 6' (1.829 m)   Wt 233 lb (105.7 kg)   SpO2 97%   BMI 31.60 kg/m     Wt Readings from Last 3 Encounters:  05/13/20 233 lb (105.7 kg)  05/06/20 235 lb (106.6 kg)  12/06/19 237 lb (107.5 kg)     GEN: Patient is in no acute distress HEENT: Normal NECK: No JVD; No carotid bruits LYMPHATICS: No lymphadenopathy CARDIAC: Hear sounds regular, 2/6 systolic murmur at the apex. RESPIRATORY:  Clear to auscultation without rales, wheezing or rhonchi  ABDOMEN: Soft, non-tender, non-distended MUSCULOSKELETAL:  No edema; No deformity  SKIN: Warm and dry NEUROLOGIC:  Alert and oriented x 3 PSYCHIATRIC:  Normal affect   Signed, Jenean Lindau, MD  05/13/2020 9:16 AM    [place]

## 2020-05-13 NOTE — Patient Instructions (Signed)
Medication Instructions:  Your physician has recommended you make the following change in your medication:   Start Vascepa 2 gms twice daily.  *If you need a refill on your cardiac medications before your next appointment, please call your pharmacy*   Lab Work: None ordered If you have labs (blood work) drawn today and your tests are completely normal, you will receive your results only by: [name] MyChart Message (if you have MyChart) OR . A paper copy in the mail If you have any lab test that is abnormal or we need to change your treatment, we will call you to review the results.   Testing/Procedures: None ordered   Follow-Up: At [facility], you and your health needs are our priority.  As part of our continuing mission to provide you with exceptional heart care, we have created designated Provider Care Teams.  These Care Teams include your primary Cardiologist (physician) and Advanced Practice Providers (APPs -  Physician Assistants and Nurse Practitioners) who all work together to provide you with the care you need, when you need it.  We recommend signing up for the patient portal called "MyChart".  Sign up information is provided on this After Visit Summary.  MyChart is used to connect with patients for Virtual Visits (Telemedicine).  Patients are able to view lab/test results, encounter notes, upcoming appointments, etc.  Non-urgent messages can be sent to your provider as well.   To learn more about what you can do with MyChart, go to [email or web address].    Your next appointment:   6 month(s)  The format for your next appointment:   In Person  Provider:   Jyl Heinz, MD   Other Instructions Icosapent ethyl capsules What is this medicine? ICOSAPENT ETHYL (eye KOE sa pent eth il) contains essential fats. It is used to treat high triglyceride levels. Diet and lifestyle changes are often used with this drug. This medicine may be used for other purposes; ask your  health care provider or pharmacist if you have questions. COMMON BRAND NAME(S): VASCEPA What should I tell my health care provider before I take this medicine? They need to know if you have any of these conditions:  bleeding disorders  liver disease  an unusual or allergic reaction to icosapent ethyl, fish, shellfish, other medicines, foods, dyes, or preservatives  history of irregular heartbeat  pregnant or trying to get pregnant  breast-feeding How should I use this medicine? Take this medicine by mouth with a glass of water. Follow the directions on the prescription label. Take this medicine with food. Do not cut, crush, dissolve, or chew this medicine. Take your medicine at regular intervals. Do not take it more often than directed. Do not stop taking except on your doctor's advice. Talk to your pediatrician regarding the use of this medicine in children. Special care may be needed. Overdosage: If you think you have taken too much of this medicine contact a poison control center or emergency room at once. NOTE: This medicine is only for you. Do not share this medicine with others. What if I miss a dose? If you miss a dose, take it as soon as you can. If it is almost time for your next dose, take only that dose. Do not take double or extra doses. What may interact with this medicine? This medicine may interact with the following medications:  aspirin and aspirin-like medicines  beta-blockers like metoprolol and propranolol  certain medicines that treat or prevent blood clots like warfarin, enoxaparin, dalteparin, apixaban,  dabigatran, and rivaroxaban  diuretics  male hormones, like estrogens and birth control pills This list may not describe all possible interactions. Give your health care provider a list of all the medicines, herbs, non-prescription drugs, or dietary supplements you use. Also tell them if you smoke, drink alcohol, or use illegal drugs. Some items may interact  with your medicine. What should I watch for while using this medicine? You may need blood work done while you are taking this medicine. Follow a good diet and exercise plan. Taking this medicine does not replace a healthy lifestyle. Some foods that have omega-3 fatty acids naturally are fatty fish like albacore tuna, halibut, herring, mackerel, lake trout, salmon, and sardines. If you are scheduled for any medical or dental procedure, tell your healthcare provider that you are taking this medicine. You may need to stop taking this medicine before the procedure. What side effects may I notice from receiving this medicine? Side effects that you should report to your doctor or health care professional as soon as possible:  allergic reactions like skin rash, itching or hives, swelling of the face, lips, or tongue  breathing problems  unusual bleeding or bruising  fast, irregular heartbeat Side effects that usually do not require medical attention (report to your doctor or health care professional if they continue or are bothersome):  muscle or joint pain  sore throat  swelling of the ankles, feet, hands  constipation  Gout This list may not describe all possible side effects. Call your doctor for medical advice about side effects. You may report side effects to FDA at 1-800-FDA-1088. Where should I keep my medicine? Keep out of the reach of children. Store at room temperature between 15 and 30 degrees C (59 and 86 degrees F). Throw away any unused medicine after the expiration date. NOTE: This sheet is a summary. It may not cover all possible information. If you have questions about this medicine, talk to your doctor, pharmacist, or health care provider.  2020 Elsevier/Gold Standard (2018-10-30 18:35:46)

## 2020-05-19 ENCOUNTER — Other Ambulatory Visit: Payer: Self-pay | Admitting: Internal Medicine

## 2020-06-15 ENCOUNTER — Other Ambulatory Visit: Payer: Self-pay | Admitting: Internal Medicine

## 2020-06-16 NOTE — Telephone Encounter (Signed)
[place] Controlled Database Checked Last filled: 05/14/2020 (180) LOV w/you: 05/06/2020 Next appt w/you: 08/11/2020

## 2020-07-08 ENCOUNTER — Other Ambulatory Visit: Payer: Self-pay | Admitting: Internal Medicine

## 2020-07-10 ENCOUNTER — Other Ambulatory Visit: Payer: Self-pay | Admitting: Family

## 2020-07-10 DIAGNOSIS — E785 Hyperlipidemia, unspecified: Secondary | ICD-10-CM

## 2020-07-13 ENCOUNTER — Other Ambulatory Visit: Payer: Self-pay | Admitting: Internal Medicine

## 2020-07-21 ENCOUNTER — Other Ambulatory Visit: Payer: Self-pay | Admitting: Internal Medicine

## 2020-07-28 ENCOUNTER — Telehealth: Payer: Self-pay

## 2020-07-28 ENCOUNTER — Other Ambulatory Visit (INDEPENDENT_AMBULATORY_CARE_PROVIDER_SITE_OTHER): Payer: Medicare HMO

## 2020-07-28 DIAGNOSIS — Z0000 Encounter for general adult medical examination without abnormal findings: Secondary | ICD-10-CM | POA: Diagnosis not present

## 2020-07-28 LAB — CBC WITH DIFFERENTIAL/PLATELET
Basophils Absolute: 0.1 10*3/uL (ref 0.0–0.1)
Basophils Relative: 0.8 % (ref 0.0–3.0)
Eosinophils Absolute: 0.2 10*3/uL (ref 0.0–0.7)
Eosinophils Relative: 3.4 % (ref 0.0–5.0)
HCT: 43.7 % (ref 39.0–52.0)
Hemoglobin: 14.9 g/dL (ref 13.0–17.0)
Lymphocytes Relative: 44.2 % (ref 12.0–46.0)
Lymphs Abs: 2.9 10*3/uL (ref 0.7–4.0)
MCHC: 34 g/dL (ref 30.0–36.0)
MCV: 94.1 fl (ref 78.0–100.0)
Monocytes Absolute: 0.8 10*3/uL (ref 0.1–1.0)
Monocytes Relative: 11.6 % (ref 3.0–12.0)
Neutro Abs: 2.6 10*3/uL (ref 1.4–7.7)
Neutrophils Relative %: 40 % — ABNORMAL LOW (ref 43.0–77.0)
Platelets: 221 10*3/uL (ref 150.0–400.0)
RBC: 4.64 Mil/uL (ref 4.22–5.81)
RDW: 13.6 % (ref 11.5–15.5)
WBC: 6.6 10*3/uL (ref 4.0–10.5)

## 2020-07-28 LAB — HEPATIC FUNCTION PANEL
ALT: 32 U/L (ref 0–53)
AST: 25 U/L (ref 0–37)
Albumin: 4.4 g/dL (ref 3.5–5.2)
Alkaline Phosphatase: 65 U/L (ref 39–117)
Bilirubin, Direct: 0.1 mg/dL (ref 0.0–0.3)
Total Bilirubin: 0.8 mg/dL (ref 0.2–1.2)
Total Protein: 7.6 g/dL (ref 6.0–8.3)

## 2020-07-28 LAB — URINALYSIS, ROUTINE W REFLEX MICROSCOPIC
Bilirubin Urine: NEGATIVE
Ketones, ur: NEGATIVE
Leukocytes,Ua: NEGATIVE
Nitrite: NEGATIVE
Specific Gravity, Urine: <=1.005 — AB (ref 1.000–1.030)
Total Protein, Urine: NEGATIVE
Urine Glucose: NEGATIVE
Urobilinogen, UA: 0.2 (ref 0.0–1.0)
WBC, UA: NONE SEEN (ref 0–?)
pH: 6 (ref 5.0–8.0)

## 2020-07-28 LAB — LIPID PANEL
Cholesterol: 175 mg/dL (ref 0–200)
HDL: 38.2 mg/dL — ABNORMAL LOW (ref >39.00)
LDL Cholesterol: 111 mg/dL — ABNORMAL HIGH (ref 0–99)
NonHDL: 136.41
Total CHOL/HDL Ratio: 5
Triglycerides: 129 mg/dL (ref 0.0–149.0)
VLDL: 25.8 mg/dL (ref 0.0–40.0)

## 2020-07-28 LAB — BASIC METABOLIC PANEL
BUN: 17 mg/dL (ref 6–23)
CO2: 26 mEq/L (ref 19–32)
Calcium: 9.4 mg/dL (ref 8.4–10.5)
Chloride: 104 mEq/L (ref 96–112)
Creatinine, Ser: 0.81 mg/dL (ref 0.40–1.50)
GFR: 95.03 mL/min (ref >60.00)
Glucose, Bld: 113 mg/dL — ABNORMAL HIGH (ref 70–99)
Potassium: 4 mEq/L (ref 3.5–5.1)
Sodium: 138 mEq/L (ref 135–145)

## 2020-07-28 LAB — HEMOGLOBIN A1C: Hgb A1c MFr Bld: 6 % (ref 4.6–6.5)

## 2020-07-28 LAB — PSA: PSA: 1.18 ng/mL (ref 0.10–4.00)

## 2020-07-28 LAB — TSH: TSH: 3.51 u[IU]/mL (ref 0.35–4.50)

## 2020-07-28 NOTE — Telephone Encounter (Signed)
Labs ordered.

## 2020-07-28 NOTE — Addendum Note (Signed)
Addended by: Jacob Moores on: 07/28/2020 08:39 AM   Modules accepted: Orders

## 2020-08-03 DIAGNOSIS — Z20828 Contact with and (suspected) exposure to other viral communicable diseases: Secondary | ICD-10-CM | POA: Diagnosis not present

## 2020-08-03 DIAGNOSIS — J0100 Acute maxillary sinusitis, unspecified: Secondary | ICD-10-CM | POA: Diagnosis not present

## 2020-08-03 DIAGNOSIS — R05 Cough: Secondary | ICD-10-CM | POA: Diagnosis not present

## 2020-08-11 ENCOUNTER — Encounter: Payer: Self-pay | Admitting: Internal Medicine

## 2020-08-11 ENCOUNTER — Other Ambulatory Visit: Payer: Self-pay

## 2020-08-11 ENCOUNTER — Ambulatory Visit (INDEPENDENT_AMBULATORY_CARE_PROVIDER_SITE_OTHER): Payer: Medicare HMO | Admitting: Internal Medicine

## 2020-08-11 VITALS — BP 148/80 | HR 64 | Temp 98.1°F | Ht 72.0 in | Wt 233.0 lb

## 2020-08-11 DIAGNOSIS — R251 Tremor, unspecified: Secondary | ICD-10-CM

## 2020-08-11 DIAGNOSIS — F419 Anxiety disorder, unspecified: Secondary | ICD-10-CM

## 2020-08-11 DIAGNOSIS — R739 Hyperglycemia, unspecified: Secondary | ICD-10-CM | POA: Diagnosis not present

## 2020-08-11 DIAGNOSIS — I2510 Atherosclerotic heart disease of native coronary artery without angina pectoris: Secondary | ICD-10-CM | POA: Diagnosis not present

## 2020-08-11 DIAGNOSIS — M5440 Lumbago with sciatica, unspecified side: Secondary | ICD-10-CM

## 2020-08-11 DIAGNOSIS — Z0000 Encounter for general adult medical examination without abnormal findings: Secondary | ICD-10-CM | POA: Diagnosis not present

## 2020-08-11 DIAGNOSIS — Z23 Encounter for immunization: Secondary | ICD-10-CM

## 2020-08-11 DIAGNOSIS — E785 Hyperlipidemia, unspecified: Secondary | ICD-10-CM

## 2020-08-11 NOTE — Assessment & Plan Note (Signed)
Labs

## 2020-08-11 NOTE — Assessment & Plan Note (Signed)
Stable

## 2020-08-11 NOTE — Assessment & Plan Note (Signed)

## 2020-08-11 NOTE — Assessment & Plan Note (Addendum)
Vascepa, Zetia

## 2020-08-11 NOTE — Assessment & Plan Note (Signed)
Tramadol  Potential benefits of a long term opioids use as well as potential risks (i.e. addiction risk, apnea etc) and complications (i.e. Somnolence, constipation and others) were explained to the patient and were aknowledged.   

## 2020-08-11 NOTE — Assessment & Plan Note (Signed)
Xanax prn - d/c  Potential benefits of a long term benzodiazepines  use as well as potential risks  and complications were explained to the patient and were aknowledged.

## 2020-08-11 NOTE — Progress Notes (Signed)
Subjective:  Patient ID: [name], male    DOB: 1951-07-20  Age: 69 y.o. MRN: [id number]  CC: No chief complaint on file.   HPI [name] presents for CAD, dyslipidemia, AO, LBP f/u Well exam  Outpatient Medications Prior to Visit  Medication Sig Dispense Refill  . acetaminophen (TYLENOL) 500 MG tablet Take 500 mg by mouth every 6 (six) hours as needed for headache.    . Alum Hydroxide-Mag Carbonate (GAVISCON PO) Take 1 tablet by mouth daily as needed (indigestion).    . Ascorbic Acid (VITAMIN C) 1000 MG tablet Take 1,000 mg by mouth daily.    [name] aspirin 81 MG tablet Take 81 mg by mouth at bedtime.     . cholecalciferol (VITAMIN D) 1000 units tablet Take 1,000 Units by mouth daily.    . Clotrimazole (LOTRIMIN AF JOCK ITCH EX) Apply 1 application topically daily as needed (sweat).    . Cyanocobalamin (VITAMIN B-12) 2500 MCG SUBL Place 5,000 mcg under the tongue daily.    . EQ LORATADINE 10 MG tablet TAKE 1 TABLET BY MOUTH ONCE DAILY AS NEEDED FOR  ALLERGIES 90 tablet 3  . escitalopram (LEXAPRO) 10 MG tablet Take 1 tablet by mouth once daily 90 tablet 1  . ezetimibe (ZETIA) 10 MG tablet Take 1 tablet by mouth once daily 90 tablet 2  . fluticasone (FLONASE) 50 MCG/ACT nasal spray Use 1 spray(s) in each nostril once daily 48 g 3  . Ginger, Zingiber officinalis, (GINGER ROOT PO) Take by mouth.    . icosapent Ethyl (VASCEPA) 1 g capsule Take 2 capsules (2 g total) by mouth 2 (two) times daily. 120 capsule 12  . meloxicam (MOBIC) 7.5 MG tablet Take 2 tablets by mouth once daily 180 tablet 0  . Misc Natural Products (TART CHERRY ADVANCED PO) Take by mouth.    . Multiple Vitamin (MULTIVITAMIN WITH MINERALS) TABS tablet Take 1 tablet by mouth daily.    . ondansetron (ZOFRAN) 4 MG tablet TAKE 1 TO 2 TABLETS BY MOUTH EVERY 4 TO 6 HOURS AS NEEDED FOR NAUSEA (PATIENT  NEEDS  OFFICE  VISIT  FOR  FURTHER  REFILLS) 30 tablet 2  . OVER THE COUNTER MEDICATION Flax Seed Oil, One capsule daily.     [name] OVER THE COUNTER MEDICATION Fish Oil , one capsule daily.    . pantoprazole (PROTONIX) 40 MG tablet Take 1 tablet (40 mg total) by mouth 2 (two) times daily. Annual appt w/labs due in Sept must see provider for future refills 180 tablet 0  . Red Yeast Rice 600 MG TABS Take 1,200 mg by mouth daily.    . traMADol (ULTRAM) 50 MG tablet TAKE 1 TO 2 TABLETS BY MOUTH THREE TIMES DAILY AS NEEDED 180 tablet 0  . triamcinolone ointment (KENALOG) 0.5 % APPLY OINTMENT TOPICALLY TWICE DAILY 45 g 0  . Turmeric 500 MG TABS Take 500 mg by mouth 2 (two) times daily.    . valACYclovir (VALTREX) 500 MG tablet Take 1 tablet (500 mg total) by mouth daily. Annual appt due in Sept must see provider for future refills 90 tablet 1  . nitroGLYCERIN (NITROSTAT) 0.4 MG SL tablet Place 1 tablet (0.4 mg total) under the tongue every 5 (five) minutes as needed for chest pain. 11 tablet 6   No facility-administered medications prior to visit.    ROS: Review of Systems  Constitutional: Negative for appetite change, fatigue and unexpected weight change.  HENT: Negative for congestion, nosebleeds, sneezing,  sore throat and trouble swallowing.   Eyes: Negative for itching and visual disturbance.  Respiratory: Negative for cough.   Cardiovascular: Negative for chest pain, palpitations and leg swelling.  Gastrointestinal: Negative for abdominal distention, blood in stool, diarrhea and nausea.  Genitourinary: Negative for frequency and hematuria.  Musculoskeletal: Positive for arthralgias and back pain. Negative for gait problem, joint swelling and neck pain.  Skin: Negative for rash.  Neurological: Negative for dizziness, tremors, speech difficulty and weakness.  Psychiatric/Behavioral: Negative for agitation, dysphoric mood and sleep disturbance. The patient is nervous/anxious.     Objective:  BP (!) 148/80 (BP Location: Right Arm, Patient Position: Sitting, Cuff Size: Large)   Pulse 64   Temp 98.1 F (36.7 C) (Oral)    Ht 6' (1.829 m)   Wt 233 lb (105.7 kg)   SpO2 98%   BMI 31.60 kg/m   BP Readings from Last 3 Encounters:  08/11/20 (!) 148/80  05/13/20 124/80  05/06/20 126/70    Wt Readings from Last 3 Encounters:  08/11/20 233 lb (105.7 kg)  05/13/20 233 lb (105.7 kg)  05/06/20 235 lb (106.6 kg)    Physical Exam Constitutional:      General: He is not in acute distress.    Appearance: He is well-developed.     Comments: NAD  Eyes:     Conjunctiva/sclera: Conjunctivae normal.     Pupils: Pupils are equal, round, and reactive to light.  Neck:     Thyroid: No thyromegaly.     Vascular: No JVD.  Cardiovascular:     Rate and Rhythm: Normal rate and regular rhythm.     Heart sounds: Murmur heard.  No friction rub. No gallop.   Pulmonary:     Effort: Pulmonary effort is normal. No respiratory distress.     Breath sounds: Normal breath sounds. No wheezing or rales.  Chest:     Chest wall: No tenderness.  Abdominal:     General: Bowel sounds are normal. There is no distension.     Palpations: Abdomen is soft. There is no mass.     Tenderness: There is no abdominal tenderness. There is no guarding or rebound.  Musculoskeletal:        General: Tenderness present. Normal range of motion.     Cervical back: Normal range of motion.  Lymphadenopathy:     Cervical: No cervical adenopathy.  Skin:    General: Skin is warm and dry.     Findings: No rash.  Neurological:     Mental Status: He is alert and oriented to person, place, and time.     Cranial Nerves: No cranial nerve deficit.     Motor: No abnormal muscle tone.     Coordination: Coordination normal.     Gait: Gait normal.     Deep Tendon Reflexes: Reflexes are normal and symmetric.  Psychiatric:        Behavior: Behavior normal.        Thought Content: Thought content normal.        Judgment: Judgment normal.   LS, knees w/pain Rectal per GI  Lab Results  Component Value Date   WBC 6.6 07/28/2020   HGB 14.9 07/28/2020     HCT 43.7 07/28/2020   PLT 221.0 07/28/2020   GLUCOSE 113 (H) 07/28/2020   CHOL 175 07/28/2020   TRIG 129.0 07/28/2020   HDL 38.20 (L) 07/28/2020   LDLDIRECT 110 (H) 10/07/2019   LDLCALC 111 (H) 07/28/2020   ALT 32 07/28/2020  AST 25 07/28/2020   NA 138 07/28/2020   K 4.0 07/28/2020   CL 104 07/28/2020   CREATININE 0.81 07/28/2020   BUN 17 07/28/2020   CO2 26 07/28/2020   TSH 3.51 07/28/2020   PSA 1.18 07/28/2020   INR 1.0 08/29/2017   HGBA1C 6.0 07/28/2020    DG Chest 2 View  Result Date: 10/04/2019 CLINICAL DATA:  Chest pain and shortness of breath associated with dizziness for 3 days, worsening, sternal chest pressure, former smoker EXAM: CHEST - 2 VIEW COMPARISON:  08/21/2017 Correlation: CT angio chest 11/12/2015 FINDINGS: Normal heart size, mediastinal contours, and pulmonary vascularity. Lungs clear. No pulmonary infiltrate, pleural effusion or pneumothorax. Bone island at anterior RIGHT fifth rib unchanged since CT. No acute osseous findings. IMPRESSION: No acute abnormalities. Electronically Signed   By: Lavonia Dana M.D.   On: 10/04/2019 10:14   US Abdomen Limited RUQ  Result Date: 10/04/2019 CLINICAL DATA:  Right upper quadrant pain for 1 day. EXAM: ULTRASOUND ABDOMEN LIMITED RIGHT UPPER QUADRANT COMPARISON:  None. FINDINGS: Gallbladder: No gallstones or wall thickening visualized. No sonographic Murphy sign noted by sonographer. Common bile duct: Diameter: 2 mm, within normal limits. Liver: Diffusely increased in echogenicity. Portal vein is patent on color Doppler imaging with normal direction of blood flow towards the liver. Other: None. IMPRESSION: 1. No acute findings. 2. Hepatic steatosis. Electronically Signed   By: Lorin Picket M.D.   On: 10/04/2019 12:49    Assessment & Plan:   There are no diagnoses linked to this encounter.   No orders of the defined types were placed in this encounter.    Follow-up: No follow-ups on file.  Walker Kehr, MD

## 2020-08-21 ENCOUNTER — Other Ambulatory Visit: Payer: Self-pay | Admitting: Internal Medicine

## 2020-08-24 NOTE — Telephone Encounter (Signed)
Check [place] last filled 09/10/202. MD is out of the office until 10/22. Pls advise.Marland KitchenJohny Gill

## 2020-08-25 ENCOUNTER — Ambulatory Visit (INDEPENDENT_AMBULATORY_CARE_PROVIDER_SITE_OTHER): Payer: Medicare HMO

## 2020-08-25 DIAGNOSIS — Z0000 Encounter for general adult medical examination without abnormal findings: Secondary | ICD-10-CM

## 2020-08-25 NOTE — Progress Notes (Signed)
I connected with [name] today by telephone and verified that I am speaking with the correct person using two identifiers. Location patient: home Location provider: work Persons participating in the virtual visit: Mekhai Venuto and Lisette Abu, LPN.   I discussed the limitations, risks, security and privacy concerns of performing an evaluation and management service by telephone and the availability of in person appointments. I also discussed with the patient that there may be a patient responsible charge related to this service. The patient expressed understanding and verbally consented to this telephonic visit.    Interactive audio and video telecommunications were attempted between this provider and patient, however failed, due to patient having technical difficulties OR patient did not have access to video capability.  We continued and completed visit with audio only.  Some vital signs may be absent or patient reported.   Time Spent with patient on telephone encounter: 25 minutes  Subjective:   [name] is a 69 y.o. male who presents for Medicare Annual/Subsequent preventive examination.  Review of Systems    NO ROS. Medicare Wellness Visit. Cardiac Risk Factors include: advanced age (>25men, >33 women);dyslipidemia;family history of premature cardiovascular disease;hypertension;male gender;obesity (BMI >30kg/m2);smoking/ tobacco exposure     Objective:    Today's Vitals   08/25/20 1010  PainSc: 8    There is no height or weight on file to calculate BMI.  Advanced Directives 08/25/2020 10/04/2019 04/15/2019 12/25/2017 08/31/2017 08/21/2017 09/26/2016  Does Patient Have a Medical Advance Directive? No No No No No No No  Would patient like information on creating a medical advance directive? No - Patient declined Yes (ED - Information included in AVS) Yes (ED - Information included in AVS) Yes (ED - Information included in AVS) No - Patient declined No - Patient  declined No - patient declined information  Pre-existing out of facility DNR order (yellow form or pink MOST form) - - - - - - -    Current Medications (verified) Outpatient Encounter Medications as of 08/25/2020  Medication Sig  . acetaminophen (TYLENOL) 500 MG tablet Take 500 mg by mouth every 6 (six) hours as needed for headache.  . Alum Hydroxide-Mag Carbonate (GAVISCON PO) Take 1 tablet by mouth daily as needed (indigestion).  . Ascorbic Acid (VITAMIN C) 1000 MG tablet Take 1,000 mg by mouth daily.  [name] aspirin 81 MG tablet Take 81 mg by mouth at bedtime.   . cholecalciferol (VITAMIN D) 1000 units tablet Take 1,000 Units by mouth daily.  . Clotrimazole (LOTRIMIN AF JOCK ITCH EX) Apply 1 application topically daily as needed (sweat).  . Cyanocobalamin (VITAMIN B-12) 2500 MCG SUBL Place 5,000 mcg under the tongue daily.  . EQ LORATADINE 10 MG tablet TAKE 1 TABLET BY MOUTH ONCE DAILY AS NEEDED FOR  ALLERGIES  . escitalopram (LEXAPRO) 10 MG tablet Take 1 tablet by mouth once daily  . ezetimibe (ZETIA) 10 MG tablet Take 1 tablet by mouth once daily  . fluticasone (FLONASE) 50 MCG/ACT nasal spray Use 1 spray(s) in each nostril once daily  . Ginger, Zingiber officinalis, (GINGER ROOT PO) Take by mouth.  . icosapent Ethyl (VASCEPA) 1 g capsule Take 2 capsules (2 g total) by mouth 2 (two) times daily.  . meloxicam (MOBIC) 7.5 MG tablet Take 2 tablets by mouth once daily  . Misc Natural Products (TART CHERRY ADVANCED PO) Take by mouth. (Patient not taking: Reported on 08/25/2020)  . Multiple Vitamin (MULTIVITAMIN WITH MINERALS) TABS tablet Take 1 tablet by mouth daily.  [name]  nitroGLYCERIN (NITROSTAT) 0.4 MG SL tablet Place 1 tablet (0.4 mg total) under the tongue every 5 (five) minutes as needed for chest pain.  [name] ondansetron (ZOFRAN) 4 MG tablet TAKE 1 TO 2 TABLETS BY MOUTH EVERY 4 TO 6 HOURS AS NEEDED FOR NAUSEA (PATIENT  NEEDS  OFFICE  VISIT  FOR  FURTHER  REFILLS)  . OVER THE COUNTER MEDICATION  Flax Seed Oil, One capsule daily.  [name] OVER THE COUNTER MEDICATION Fish Oil , one capsule daily. (Patient not taking: Reported on 08/25/2020)  . pantoprazole (PROTONIX) 40 MG tablet Take 1 tablet by mouth twice daily  . traMADol (ULTRAM) 50 MG tablet TAKE 1 TO 2 TABLETS BY MOUTH THREE TIMES DAILY AS NEEDED  . triamcinolone ointment (KENALOG) 0.5 % APPLY OINTMENT TOPICALLY TWICE DAILY  . Turmeric 500 MG TABS Take 500 mg by mouth 2 (two) times daily.  . valACYclovir (VALTREX) 500 MG tablet Take 1 tablet (500 mg total) by mouth daily. Annual appt due in Sept must see provider for future refills   No facility-administered encounter medications on file as of 08/25/2020.    Allergies (verified) Hytrin [terazosin], Levaquin [levofloxacin in d5w], Lipitor [atorvastatin], Penicillins, Reglan [metoclopramide], and Xanax [alprazolam]   History: Past Medical History:  Diagnosis Date  . Actinic keratosis   . Allergy   . Colonic polyp   . COLONIC POLYPS, HX OF   . CONTACT DERMATITIS DUE TO SOLVENTS   . DOE (dyspnea on exertion) 07-25-12   related to eating shrimp  . GERD (gastroesophageal reflux disease)   . HAND PAIN   . Heart murmur   . HERPES, GENITAL NOS   . Hyperlipidemia   . Ingrowing toenail of left foot 07-25-12   is resolved now 07-25-12  . KNEE PAIN   . Neoplasm of uncertain behavior of skin   . OA (osteoarthritis) of knee   . ONYCHOMYCOSIS   . OSTEOARTHRITIS   . Preop exam for internal medicine   . SINUSITIS, ACUTE   . Well adult exam    Past Surgical History:  Procedure Laterality Date  . COLONOSCOPY  04/24/2019  . JOINT REPLACEMENT  9/13   L TKR  . KNEE ARTHROSCOPY     Left  . LEFT HEART CATH AND CORONARY ANGIOGRAPHY N/A 08/31/2017   Procedure: LEFT HEART CATH AND CORONARY ANGIOGRAPHY;  Surgeon: Nelva Bush, MD;  Location: [facility];  Service: Cardiovascular;  Laterality: N/A;  . TOTAL KNEE ARTHROPLASTY  07/31/2012   Procedure: TOTAL KNEE ARTHROPLASTY;   Surgeon: Sydnee Cabal, MD;  Location: [facility];  Service: Orthopedics;  Laterality: Left;  . UMBILICAL HERNIA REPAIR  07-25-12   10 yrs ago  . UPPER GASTROINTESTINAL ENDOSCOPY  04/24/2019   Family History  Problem Relation Age of Onset  . Diabetes Mother 60  . Heart disease Father 32       CAD, MI, CHF  . Lung cancer Brother        smoker  . Coronary artery disease Other   . Colon polyps Brother   . Heart disease Brother 64       CHF  . Colon cancer Brother   . Healthy Daughter   . Stomach cancer Neg Hx   . Rectal cancer Neg Hx   . Esophageal cancer Neg Hx    Social History   Socioeconomic History  . Marital status: Married    Spouse name: Not on file  . Number of children: 2  . Years of education: Not on  file  . Highest education level: Not on file  Occupational History  . Occupation: retired  Tobacco Use  . Smoking status: Former Smoker    Types: Cigars    Quit date: 08/30/2007    Years since quitting: 12.9  . Smokeless tobacco: Never Used  . Tobacco comment: occ. cigar  Vaping Use  . Vaping Use: Never used  Substance and Sexual Activity  . Alcohol use: Yes    Comment: occ.   . Drug use: Yes    Types: Marijuana    Comment: last marijuana this am, 2 "hits" per patient  . Sexual activity: Yes    Partners: Female    Birth control/protection: None  Other Topics Concern  . Not on file  Social History Narrative  . Not on file   Social Determinants of Health   Financial Resource Strain: Low Risk   . Difficulty of Paying Living Expenses: Not hard at all  Food Insecurity: No Food Insecurity  . Worried About Charity fundraiser in the Last Year: Never true  . Ran Out of Food in the Last Year: Never true  Transportation Needs: No Transportation Needs  . Lack of Transportation (Medical): No  . Lack of Transportation (Non-Medical): No  Physical Activity: Sufficiently Active  . Days of Exercise per Week: 5 days  . Minutes of Exercise per Session: 30 min  Stress:  No Stress Concern Present  . Feeling of Stress : Not at all  Social Connections: Unknown  . Frequency of Communication with Friends and Family: More than three times a week  . Frequency of Social Gatherings with Friends and Family: More than three times a week  . Attends Religious Services: Patient refused  . Active Member of Clubs or Organizations: Patient refused  . Attends Archivist Meetings: Patient refused  . Marital Status: Married    Tobacco Counseling Counseling given: Not Answered Comment: occ. cigar   Clinical Intake:  Pre-visit preparation completed: Yes  Pain : 0-10 Pain Score: 8  Pain Type: Chronic pain Pain Location: Knee Pain Orientation: Right Pain Descriptors / Indicators: Constant, Throbbing, Discomfort Pain Onset: More than a month ago Pain Frequency: Constant Pain Relieving Factors: Tramadol, Meloxicam, Voltaren Gel, Knee brace Effect of Pain on Daily Activities: Pain produces disability and affects the quality of life.  Pain Relieving Factors: Tramadol, Meloxicam, Voltaren Gel, Knee brace  Nutritional Risks: None Diabetes: No  How often do you need to have someone help you when you read instructions, pamphlets, or other written materials from your doctor or pharmacy?: 1 - Never What is the last grade level you completed in school?: HSG; Industrial Management Classes at [employer]  Diabetic? no  Interpreter Needed?: No  Information entered by :: [employer]. Lakendrick Paradis, LPN   Activities of Daily Living In your present state of health, do you have any difficulty performing the following activities: 08/25/2020  Hearing? N  Vision? N  Difficulty concentrating or making decisions? N  Walking or climbing stairs? N  Dressing or bathing? N  Doing errands, shopping? N  Preparing Food and eating ? N  Using the Toilet? N  In the past six months, have you accidently leaked urine? N  Do you have problems with loss of bowel control? N    Managing your Medications? N  Managing your Finances? N  Housekeeping or managing your Housekeeping? N  Some recent data might be hidden    Patient Care Team: Plotnikov, Evie Lacks, MD as PCP -  General Revankar, Reita Cliche, MD as PCP - Cardiology (Cardiology) Josue Hector, MD (Cardiology) Sydnee Cabal, MD (Orthopedic Surgery) Irene Shipper, MD as Consulting Physician (Gastroenterology) Revankar, Reita Cliche, MD as Consulting Physician (Cardiology) Tat, Eustace Quail, DO as Consulting Physician (Neurology)  Indicate any recent Medical Services you may have received from other than [facility] providers in the past year (date may be approximate).     Assessment:   This is a routine wellness examination for Adron.  Hearing/Vision screen No exam data present  Dietary issues and exercise activities discussed: Current Exercise Habits: Home exercise routine, Type of exercise: strength training/weights;treadmill;walking;Other - see comments (riding bike, elliptical), Time (Minutes): 30, Frequency (Times/Week): 5, Weekly Exercise (Minutes/Week): 150, Intensity: Moderate, Exercise limited by: orthopedic condition(s)  Goals    . Patient Stated     I want to continue to exercise, eat healthy, enjoy family and camp      Depression Screen PHQ 2/9 Scores 08/25/2020 05/06/2020 04/15/2019 12/25/2017 09/29/2017 07/18/2017  PHQ - 2 Score 0 0 1 1 5  -  PHQ- 9 Score - - 3 1 20  -  Exception Documentation - - - - - Other- indicate reason in comment box    Fall Risk Fall Risk  08/25/2020 05/06/2020 04/15/2019 12/25/2017 09/25/2017  Falls in the past year? 1 0 1 Yes Yes  Number falls in past yr: 0 0 0 - 1  Injury with Fall? 0 0 0 No No  Risk for fall due to : No Fall Risks - - - -  Follow up Falls evaluation completed - Falls prevention discussed - Falls evaluation completed    Any stairs in or around the home? Yes  If so, are there any without handrails? No  Home free of loose throw rugs in walkways, pet beds,  electrical cords, etc? Yes  Adequate lighting in your home to reduce risk of falls? Yes   ASSISTIVE DEVICES UTILIZED TO PREVENT FALLS:  Life alert? No  Use of a cane, walker or w/c? Yes  Grab bars in the bathroom? No  Shower chair or bench in shower? No  Elevated toilet seat or a handicapped toilet? No   TIMED UP AND GO:  Was the test performed? No .  Length of time to ambulate 10 feet: 0 sec.   Gait steady and fast with assistive device  Cognitive Function: Patient is cogitatively intact.        Immunizations Immunization History  Administered Date(s) Administered  . Fluad Quad(high Dose 65+) 07/25/2019, 08/11/2020  . Influenza Split 07/20/2012  . Influenza Whole 08/15/2011  . Influenza, High Dose Seasonal PF 07/27/2016, 07/27/2018  . Influenza,inj,Quad PF,6+ Mos 08/12/2013, 07/18/2017  . Influenza-Unspecified 07/15/2014, 10/15/2015, 08/02/2016  . PFIZER SARS-COV-2 Vaccination 01/16/2020, 02/12/2020  . Pneumococcal Conjugate-13 11/01/2018  . Pneumococcal Polysaccharide-23 08/11/2020  . Td 01/28/2010  . Tdap 08/11/2020  . Zoster 12/23/2013    TDAP status: Up to date Flu Vaccine status: Up to date Pneumococcal vaccine status: Up to date Covid-19 vaccine status: Completed vaccines  Qualifies for Shingles Vaccine? Yes   Zostavax completed Yes   Shingrix Completed?: No.    Education has been provided regarding the importance of this vaccine. Patient has been advised to call insurance company to determine out of pocket expense if they have not yet received this vaccine. Advised may also receive vaccine at local pharmacy or Health Dept. Verbalized acceptance and understanding.  Screening Tests Health Maintenance  Topic Date Due  . COLONOSCOPY  12/05/2021  . TETANUS/TDAP  08/11/2030  . INFLUENZA VACCINE  Completed  . COVID-19 Vaccine  Completed  . Hepatitis C Screening  Completed  . PNA vac Low Risk Adult  Completed    Health Maintenance  There are no preventive  care reminders to display for this patient.  Colorectal cancer screening: Completed 12/06/2019. Repeat every 2 years  Lung Cancer Screening: (Low Dose CT Chest recommended if Age 28-80 years, 30 pack-year currently smoking OR have quit w/in 15years.) does qualify.   Lung Cancer Screening Referral: no  Additional Screening:  Hepatitis C Screening: does qualify; Completed yes  Vision Screening: Recommended annual ophthalmology exams for early detection of glaucoma and other disorders of the eye. Is the patient up to date with their annual eye exam?  Yes  Who is the provider or what is the name of the office in which the patient attends annual eye exams? Lynnell Dike, OD If pt is not established with a provider, would they like to be referred to a provider to establish care? No .   Dental Screening: Recommended annual dental exams for proper oral hygiene  Community Resource Referral / Chronic Care Management: CRR required this visit?  No   CCM required this visit?  No      Plan:     I have personally reviewed and noted the following in the patient's chart:   . Medical and social history . Use of alcohol, tobacco or illicit drugs  . Current medications and supplements . Functional ability and status . Nutritional status . Physical activity . Advanced directives . List of other physicians . Hospitalizations, surgeries, and ER visits in previous 12 months . Vitals . Screenings to include cognitive, depression, and falls . Referrals and appointments  In addition, I have reviewed and discussed with patient certain preventive protocols, quality metrics, and best practice recommendations. A written personalized care plan for preventive services as well as general preventive health recommendations were provided to patient.     Sheral Flow, LPN   02/77/[id number]   Nurse Notes:  Patient is cogitatively intact. There were no vitals filed for this visit. There is no height or  weight on file to calculate BMI. Patient stated that he is using a cane to improve his gait and balance due to knee pain.

## 2020-08-25 NOTE — Patient Instructions (Addendum)
[name] , Thank you for taking time to come for your Medicare Wellness Visit. I appreciate your ongoing commitment to your health goals. Please review the following plan we discussed and let me know if I can assist you in the future.   Screening recommendations/referrals: Colonoscopy: 12/06/2019; due every 2 years Recommended yearly ophthalmology/optometry visit for glaucoma screening and checkup Recommended yearly dental visit for hygiene and checkup  Vaccinations: Influenza vaccine: 08/11/2020 Pneumococcal vaccine: completed Tdap vaccine: 08/11/2020 Shingles vaccine: check with [facility] for documentation   Covid-19: completed  Advanced directives: Advance directive discussed with you today. Even though you declined this today please call our office should you change your mind and we can give you the proper paperwork for you to fill out.  Conditions/risks identified: Yes; Reviewed health maintenance screenings with patient today and relevant education, vaccines, and/or referrals were provided. Please continue to do your personal lifestyle choices by: daily care of teeth and gums, regular physical activity (goal should be 5 days a week for 30 minutes), eat a healthy diet, avoid tobacco and drug use, limiting any alcohol intake, taking a low-dose aspirin (if not allergic or have been advised by your provider otherwise) and taking vitamins and minerals as recommended by your provider. Continue doing brain stimulating activities (puzzles, reading, adult coloring books, staying active) to keep memory sharp. Continue to eat heart healthy diet (full of fruits, vegetables, whole grains, lean protein, water--limit salt, fat, and sugar intake) and increase physical activity as tolerated.  Next appointment: Please schedule your next Medicare Wellness Visit with your Nurse Health Advisor in 1 year by calling [phone].  Preventive Care 14 Years and Older, Male Preventive care refers to lifestyle choices  and visits with your health care provider that can promote health and wellness. What does preventive care include?  A yearly physical exam. This is also called an annual well check.  Dental exams once or twice a year.  Routine eye exams. Ask your health care provider how often you should have your eyes checked.  Personal lifestyle choices, including:  Daily care of your teeth and gums.  Regular physical activity.  Eating a healthy diet.  Avoiding tobacco and drug use.  Limiting alcohol use.  Practicing safe sex.  Taking low doses of aspirin every day.  Taking vitamin and mineral supplements as recommended by your health care provider. What happens during an annual well check? The services and screenings done by your health care provider during your annual well check will depend on your age, overall health, lifestyle risk factors, and family history of disease. Counseling  Your health care provider may ask you questions about your:  Alcohol use.  Tobacco use.  Drug use.  Emotional well-being.  Home and relationship well-being.  Sexual activity.  Eating habits.  History of falls.  Memory and ability to understand (cognition).  Work and work Statistician. Screening  You may have the following tests or measurements:  Height, weight, and BMI.  Blood pressure.  Lipid and cholesterol levels. These may be checked every 5 years, or more frequently if you are over 43 years old.  Skin check.  Lung cancer screening. You may have this screening every year starting at age 56 if you have a 30-pack-year history of smoking and currently smoke or have quit within the past 15 years.  Fecal occult blood test (FOBT) of the stool. You may have this test every year starting at age 37.  Flexible sigmoidoscopy or colonoscopy. You may have a sigmoidoscopy  every 5 years or a colonoscopy every 10 years starting at age 48.  Prostate cancer screening. Recommendations will vary  depending on your family history and other risks.  Hepatitis C blood test.  Hepatitis B blood test.  Sexually transmitted disease (STD) testing.  Diabetes screening. This is done by checking your blood sugar (glucose) after you have not eaten for a while (fasting). You may have this done every 1-3 years.  Abdominal aortic aneurysm (AAA) screening. You may need this if you are a current or former smoker.  Osteoporosis. You may be screened starting at age 82 if you are at high risk. Talk with your health care provider about your test results, treatment options, and if necessary, the need for more tests. Vaccines  Your health care provider may recommend certain vaccines, such as:  Influenza vaccine. This is recommended every year.  Tetanus, diphtheria, and acellular pertussis (Tdap, Td) vaccine. You may need a Td booster every 10 years.  Zoster vaccine. You may need this after age 42.  Pneumococcal 13-valent conjugate (PCV13) vaccine. One dose is recommended after age 63.  Pneumococcal polysaccharide (PPSV23) vaccine. One dose is recommended after age 24. Talk to your health care provider about which screenings and vaccines you need and how often you need them. This information is not intended to replace advice given to you by your health care provider. Make sure you discuss any questions you have with your health care provider. Document Released: 11/27/2015 Document Revised: 07/20/2016 Document Reviewed: 09/01/2015 Elsevier Interactive Patient Education  2017 [place] Prevention in the Home Falls can cause injuries. They can happen to people of all ages. There are many things you can do to make your home safe and to help prevent falls. What can I do on the outside of my home?  Regularly fix the edges of walkways and driveways and fix any cracks.  Remove anything that might make you trip as you walk through a door, such as a raised step or threshold.  Trim any bushes  or trees on the path to your home.  Use bright outdoor lighting.  Clear any walking paths of anything that might make someone trip, such as rocks or tools.  Regularly check to see if handrails are loose or broken. Make sure that both sides of any steps have handrails.  Any raised decks and porches should have guardrails on the edges.  Have any leaves, snow, or ice cleared regularly.  Use sand or salt on walking paths during winter.  Clean up any spills in your garage right away. This includes oil or grease spills. What can I do in the bathroom?  Use night lights.  Install grab bars by the toilet and in the tub and shower. Do not use towel bars as grab bars.  Use non-skid mats or decals in the tub or shower.  If you need to sit down in the shower, use a plastic, non-slip stool.  Keep the floor dry. Clean up any water that spills on the floor as soon as it happens.  Remove soap buildup in the tub or shower regularly.  Attach bath mats securely with double-sided non-slip rug tape.  Do not have throw rugs and other things on the floor that can make you trip. What can I do in the bedroom?  Use night lights.  Make sure that you have a light by your bed that is easy to reach.  Do not use any sheets or blankets that are too  big for your bed. They should not hang down onto the floor.  Have a firm chair that has side arms. You can use this for support while you get dressed.  Do not have throw rugs and other things on the floor that can make you trip. What can I do in the kitchen?  Clean up any spills right away.  Avoid walking on wet floors.  Keep items that you use a lot in easy-to-reach places.  If you need to reach something above you, use a strong step stool that has a grab bar.  Keep electrical cords out of the way.  Do not use floor polish or wax that makes floors slippery. If you must use wax, use non-skid floor wax.  Do not have throw rugs and other things on  the floor that can make you trip. What can I do with my stairs?  Do not leave any items on the stairs.  Make sure that there are handrails on both sides of the stairs and use them. Fix handrails that are broken or loose. Make sure that handrails are as long as the stairways.  Check any carpeting to make sure that it is firmly attached to the stairs. Fix any carpet that is loose or worn.  Avoid having throw rugs at the top or bottom of the stairs. If you do have throw rugs, attach them to the floor with carpet tape.  Make sure that you have a light switch at the top of the stairs and the bottom of the stairs. If you do not have them, ask someone to add them for you. What else can I do to help prevent falls?  Wear shoes that:  Do not have high heels.  Have rubber bottoms.  Are comfortable and fit you well.  Are closed at the toe. Do not wear sandals.  If you use a stepladder:  Make sure that it is fully opened. Do not climb a closed stepladder.  Make sure that both sides of the stepladder are locked into place.  Ask someone to hold it for you, if possible.  Clearly mark and make sure that you can see:  Any grab bars or handrails.  First and last steps.  Where the edge of each step is.  Use tools that help you move around (mobility aids) if they are needed. These include:  Canes.  Walkers.  Scooters.  Crutches.  Turn on the lights when you go into a dark area. Replace any light bulbs as soon as they burn out.  Set up your furniture so you have a clear path. Avoid moving your furniture around.  If any of your floors are uneven, fix them.  If there are any pets around you, be aware of where they are.  Review your medicines with your doctor. Some medicines can make you feel dizzy. This can increase your chance of falling. Ask your doctor what other things that you can do to help prevent falls. This information is not intended to replace advice given to you by  your health care provider. Make sure you discuss any questions you have with your health care provider. Document Released: 08/27/2009 Document Revised: 04/07/2016 Document Reviewed: 12/05/2014 Elsevier Interactive Patient Education  2017 [employer].

## 2020-09-28 ENCOUNTER — Other Ambulatory Visit: Payer: Self-pay | Admitting: Internal Medicine

## 2020-10-01 ENCOUNTER — Other Ambulatory Visit: Payer: Self-pay | Admitting: Internal Medicine

## 2020-10-05 ENCOUNTER — Other Ambulatory Visit: Payer: Self-pay | Admitting: Internal Medicine

## 2020-10-06 ENCOUNTER — Other Ambulatory Visit: Payer: Self-pay | Admitting: Internal Medicine

## 2020-10-19 ENCOUNTER — Telehealth (INDEPENDENT_AMBULATORY_CARE_PROVIDER_SITE_OTHER): Payer: Medicare HMO | Admitting: Internal Medicine

## 2020-10-19 ENCOUNTER — Other Ambulatory Visit: Payer: Self-pay

## 2020-10-19 ENCOUNTER — Encounter: Payer: Self-pay | Admitting: Internal Medicine

## 2020-10-19 DIAGNOSIS — J069 Acute upper respiratory infection, unspecified: Secondary | ICD-10-CM | POA: Diagnosis not present

## 2020-10-19 MED ORDER — DOXYCYCLINE HYCLATE 100 MG PO TABS: 100.0000 mg | ORAL_TABLET | Freq: Two times a day (BID) | ORAL | 0 refills | Status: DC

## 2020-10-19 NOTE — Progress Notes (Signed)
Virtual Visit via Telephone Note  I connected with [name] on 10/19/20 at  2:40 PM EST by telephone and verified that I am speaking with the correct person using two identifiers.  Location: Patient: Home Provider: Office   I discussed the limitations, risks, security and privacy concerns of performing an evaluation and management service by telephone and the availability of in person appointments. I also discussed with the patient that there may be a patient responsible charge related to this service. The patient expressed understanding and agreed to proceed.   History of Present Illness: C/o HA, L sinus pain, L earache, ST x 2 weeks   Observations/Objective:  He sounds congested  NAD  Assessment and Plan: See plan  Follow Up Instructions:    I discussed the assessment and treatment plan with the patient. The patient was provided an opportunity to ask questions and all were answered. The patient agreed with the plan and demonstrated an understanding of the instructions.   The patient was advised to call back or seek an in-person evaluation if the symptoms worsen or if the condition fails to improve as anticipated.  I provided 15 minutes of non-face-to-face time during this encounter.   Walker Kehr, MD

## 2020-10-19 NOTE — Telephone Encounter (Signed)
MD called patient.Marland KitchenJohny Gill

## 2020-10-19 NOTE — Assessment & Plan Note (Signed)
L OM, sinusitis Doxy x 10 d

## 2020-11-09 ENCOUNTER — Other Ambulatory Visit: Payer: Self-pay

## 2020-11-10 ENCOUNTER — Encounter: Payer: Self-pay | Admitting: Internal Medicine

## 2020-11-10 ENCOUNTER — Ambulatory Visit (INDEPENDENT_AMBULATORY_CARE_PROVIDER_SITE_OTHER): Payer: Medicare HMO | Admitting: Internal Medicine

## 2020-11-10 DIAGNOSIS — M25561 Pain in right knee: Secondary | ICD-10-CM

## 2020-11-10 DIAGNOSIS — M5440 Lumbago with sciatica, unspecified side: Secondary | ICD-10-CM | POA: Diagnosis not present

## 2020-11-10 DIAGNOSIS — M25562 Pain in left knee: Secondary | ICD-10-CM | POA: Diagnosis not present

## 2020-11-10 DIAGNOSIS — R5382 Chronic fatigue, unspecified: Secondary | ICD-10-CM

## 2020-11-10 DIAGNOSIS — E785 Hyperlipidemia, unspecified: Secondary | ICD-10-CM

## 2020-11-10 DIAGNOSIS — J320 Chronic maxillary sinusitis: Secondary | ICD-10-CM

## 2020-11-10 MED ORDER — DOXYCYCLINE HYCLATE 100 MG PO TABS
100.0000 mg | ORAL_TABLET | Freq: Two times a day (BID) | ORAL | 0 refills | Status: DC
Start: 2020-11-10 — End: 2021-01-07

## 2020-11-10 NOTE — Assessment & Plan Note (Signed)
Not resolved Doxy x 4 wks

## 2020-11-10 NOTE — Progress Notes (Signed)
Subjective:  Patient ID: [name], male    DOB: 08/02/1951  Age: 69 y.o. MRN: [id number]  CC: Follow-up   HPI [name] presents for OA, anxiety, dyslipidemia C/o ongoing sinusitis problems. Doxy po helped 40%  Outpatient Medications Prior to Visit  Medication Sig Dispense Refill  . acetaminophen (TYLENOL) 500 MG tablet Take 500 mg by mouth every 6 (six) hours as needed for headache.    . Alum Hydroxide-Mag Carbonate (GAVISCON PO) Take 1 tablet by mouth daily as needed (indigestion).    . Ascorbic Acid (VITAMIN C) 1000 MG tablet Take 1,000 mg by mouth daily.    [name] aspirin 81 MG tablet Take 81 mg by mouth at bedtime.    . cholecalciferol (VITAMIN D) 1000 units tablet Take 1,000 Units by mouth daily.    . Clotrimazole (LOTRIMIN AF JOCK ITCH EX) Apply 1 application topically daily as needed (sweat).    . Cyanocobalamin (VITAMIN B-12) 2500 MCG SUBL Place 5,000 mcg under the tongue daily.    [name] doxycycline (VIBRA-TABS) 100 MG tablet Take 1 tablet (100 mg total) by mouth 2 (two) times daily. 20 tablet 0  . EQ LORATADINE 10 MG tablet TAKE 1 TABLET BY MOUTH ONCE DAILY AS NEEDED FOR  ALLERGIES 90 tablet 3  . escitalopram (LEXAPRO) 10 MG tablet Take 1 tablet by mouth once daily 90 tablet 1  . ezetimibe (ZETIA) 10 MG tablet Take 1 tablet by mouth once daily 90 tablet 2  . fluticasone (FLONASE) 50 MCG/ACT nasal spray Use 1 spray(s) in each nostril once daily 48 g 3  . Ginger, Zingiber officinalis, (GINGER ROOT PO) Take by mouth.    . icosapent Ethyl (VASCEPA) 1 g capsule Take 2 capsules (2 g total) by mouth 2 (two) times daily. 120 capsule 12  . meloxicam (MOBIC) 7.5 MG tablet Take 2 tablets by mouth once daily 180 tablet 1  . Misc Natural Products (TART CHERRY ADVANCED PO) Take by mouth.    . Multiple Vitamin (MULTIVITAMIN WITH MINERALS) TABS tablet Take 1 tablet by mouth daily.    . ondansetron (ZOFRAN) 4 MG tablet TAKE 1 TO 2 TABLETS BY MOUTH EVERY 4 TO 6 HOURS AS NEEDED FOR NAUSEA  (PATIENT  NEEDS  OFFICE  VISIT  FOR  FURTHER  REFILLS) 30 tablet 2  . OVER THE COUNTER MEDICATION Flax Seed Oil, One capsule daily.    [name] OVER THE COUNTER MEDICATION Fish Oil , one capsule daily.    . pantoprazole (PROTONIX) 40 MG tablet Take 1 tablet by mouth twice daily 180 tablet 0  . traMADol (ULTRAM) 50 MG tablet TAKE 1 TO 2 TABLETS BY MOUTH THREE TIMES DAILY AS NEEDED 180 tablet 0  . triamcinolone ointment (KENALOG) 0.5 % APPLY OINTMENT TOPICALLY TWICE DAILY 45 g 0  . Turmeric 500 MG TABS Take 500 mg by mouth 2 (two) times daily.    . valACYclovir (VALTREX) 500 MG tablet Take 1 tablet by mouth once daily 90 tablet 2  . nitroGLYCERIN (NITROSTAT) 0.4 MG SL tablet Place 1 tablet (0.4 mg total) under the tongue every 5 (five) minutes as needed for chest pain. 11 tablet 6   No facility-administered medications prior to visit.    ROS: Review of Systems  Constitutional: Negative for appetite change, fatigue and unexpected weight change.  HENT: Positive for congestion, postnasal drip, rhinorrhea and sinus pressure. Negative for nosebleeds, sneezing, sore throat and trouble swallowing.   Eyes: Negative for itching and visual disturbance.  Respiratory: Negative  for cough.   Cardiovascular: Negative for chest pain, palpitations and leg swelling.  Gastrointestinal: Negative for abdominal distention, blood in stool, diarrhea and nausea.  Genitourinary: Negative for frequency and hematuria.  Musculoskeletal: Positive for arthralgias and gait problem. Negative for back pain, joint swelling and neck pain.  Skin: Negative for rash.  Neurological: Negative for dizziness, tremors, speech difficulty and weakness.  Psychiatric/Behavioral: Negative for agitation, dysphoric mood and sleep disturbance. The patient is nervous/anxious.     Objective:  BP 140/88   Pulse 79   Temp 98.1 F (36.7 C) (Oral)   Ht 6' (1.829 m)   Wt 241 lb (109.3 kg)   SpO2 97%   BMI 32.69 kg/m   BP Readings from Last 3  Encounters:  11/10/20 140/88  08/11/20 (!) 148/80  05/13/20 124/80    Wt Readings from Last 3 Encounters:  11/10/20 241 lb (109.3 kg)  08/11/20 233 lb (105.7 kg)  05/13/20 233 lb (105.7 kg)    Physical Exam Constitutional:      General: He is not in acute distress.    Appearance: He is well-developed.     Comments: NAD  HENT:     Mouth/Throat:     Mouth: Oropharynx is clear and moist.  Eyes:     Conjunctiva/sclera: Conjunctivae normal.     Pupils: Pupils are equal, round, and reactive to light.  Neck:     Thyroid: No thyromegaly.     Vascular: No JVD.  Cardiovascular:     Rate and Rhythm: Normal rate and regular rhythm.     Pulses: Intact distal pulses.     Heart sounds: Normal heart sounds. No murmur heard. No friction rub. No gallop.   Pulmonary:     Effort: Pulmonary effort is normal. No respiratory distress.     Breath sounds: Normal breath sounds. No wheezing or rales.  Chest:     Chest wall: No tenderness.  Abdominal:     General: Bowel sounds are normal. There is no distension.     Palpations: Abdomen is soft. There is no mass.     Tenderness: There is no abdominal tenderness. There is no guarding or rebound.  Musculoskeletal:        General: No tenderness or edema. Normal range of motion.     Cervical back: Normal range of motion.  Lymphadenopathy:     Cervical: No cervical adenopathy.  Skin:    General: Skin is warm and dry.     Findings: No rash.  Neurological:     Mental Status: He is alert and oriented to person, place, and time.     Cranial Nerves: No cranial nerve deficit.     Motor: No abnormal muscle tone.     Coordination: He displays a negative Romberg sign. Coordination normal.     Gait: Gait normal.     Deep Tendon Reflexes: Reflexes are normal and symmetric.  Psychiatric:        Mood and Affect: Mood and affect normal.        Behavior: Behavior normal.        Thought Content: Thought content normal.        Judgment: Judgment normal.    swollen nasal mucosa  Lab Results  Component Value Date   WBC 6.6 07/28/2020   HGB 14.9 07/28/2020   HCT 43.7 07/28/2020   PLT 221.0 07/28/2020   GLUCOSE 113 (H) 07/28/2020   CHOL 175 07/28/2020   TRIG 129.0 07/28/2020   HDL 38.20 (L) 07/28/2020  LDLDIRECT 110 (H) 10/07/2019   LDLCALC 111 (H) 07/28/2020   ALT 32 07/28/2020   AST 25 07/28/2020   NA 138 07/28/2020   K 4.0 07/28/2020   CL 104 07/28/2020   CREATININE 0.81 07/28/2020   BUN 17 07/28/2020   CO2 26 07/28/2020   TSH 3.51 07/28/2020   PSA 1.18 07/28/2020   INR 1.0 08/29/2017   HGBA1C 6.0 07/28/2020    DG Chest 2 View  Result Date: 10/04/2019 CLINICAL DATA:  Chest pain and shortness of breath associated with dizziness for 3 days, worsening, sternal chest pressure, former smoker EXAM: CHEST - 2 VIEW COMPARISON:  08/21/2017 Correlation: CT angio chest 11/12/2015 FINDINGS: Normal heart size, mediastinal contours, and pulmonary vascularity. Lungs clear. No pulmonary infiltrate, pleural effusion or pneumothorax. Bone island at anterior RIGHT fifth rib unchanged since CT. No acute osseous findings. IMPRESSION: No acute abnormalities. Electronically Signed   By: Lavonia Dana M.D.   On: 10/04/2019 10:14   US Abdomen Limited RUQ  Result Date: 10/04/2019 CLINICAL DATA:  Right upper quadrant pain for 1 day. EXAM: ULTRASOUND ABDOMEN LIMITED RIGHT UPPER QUADRANT COMPARISON:  None. FINDINGS: Gallbladder: No gallstones or wall thickening visualized. No sonographic Murphy sign noted by sonographer. Common bile duct: Diameter: 2 mm, within normal limits. Liver: Diffusely increased in echogenicity. Portal vein is patent on color Doppler imaging with normal direction of blood flow towards the liver. Other: None. IMPRESSION: 1. No acute findings. 2. Hepatic steatosis. Electronically Signed   By: Lorin Picket M.D.   On: 10/04/2019 12:49    Assessment & Plan:    Walker Kehr, MD

## 2020-11-10 NOTE — Assessment & Plan Note (Signed)
Vascepa, Zetia 

## 2020-11-10 NOTE — Assessment & Plan Note (Signed)
Tramadol  Potential benefits of a long term opioids use as well as potential risks (i.e. addiction risk, apnea etc) and complications (i.e. Somnolence, constipation and others) were explained to the patient and were aknowledged.   

## 2020-11-10 NOTE — Assessment & Plan Note (Signed)
Tramadol prn  Potential benefits of a long term tramadol use as well as potential risks (i.e. addiction risk, apnea etc) and complications (i.e. Somnolence, constipation and others) were explained to the patient and were aknowledged. 

## 2020-11-10 NOTE — Assessment & Plan Note (Signed)
CFS - better w/abx for sinusitis

## 2020-11-25 ENCOUNTER — Other Ambulatory Visit: Payer: Self-pay | Admitting: Internal Medicine

## 2020-12-02 ENCOUNTER — Other Ambulatory Visit: Payer: Self-pay | Admitting: Internal Medicine

## 2020-12-03 DIAGNOSIS — T7840XA Allergy, unspecified, initial encounter: Secondary | ICD-10-CM | POA: Insufficient documentation

## 2020-12-03 DIAGNOSIS — M179 Osteoarthritis of knee, unspecified: Secondary | ICD-10-CM | POA: Insufficient documentation

## 2020-12-03 DIAGNOSIS — J019 Acute sinusitis, unspecified: Secondary | ICD-10-CM | POA: Insufficient documentation

## 2020-12-03 DIAGNOSIS — K219 Gastro-esophageal reflux disease without esophagitis: Secondary | ICD-10-CM | POA: Insufficient documentation

## 2020-12-03 DIAGNOSIS — K635 Polyp of colon: Secondary | ICD-10-CM | POA: Insufficient documentation

## 2020-12-03 DIAGNOSIS — M1710 Unilateral primary osteoarthritis, unspecified knee: Secondary | ICD-10-CM | POA: Insufficient documentation

## 2020-12-03 DIAGNOSIS — E785 Hyperlipidemia, unspecified: Secondary | ICD-10-CM | POA: Insufficient documentation

## 2020-12-07 ENCOUNTER — Other Ambulatory Visit: Payer: Self-pay

## 2020-12-07 ENCOUNTER — Ambulatory Visit: Payer: Medicare HMO | Admitting: Cardiology

## 2020-12-07 ENCOUNTER — Encounter: Payer: Self-pay | Admitting: Cardiology

## 2020-12-07 ENCOUNTER — Telehealth (HOSPITAL_COMMUNITY): Payer: Self-pay | Admitting: *Deleted

## 2020-12-07 VITALS — BP 134/82 | HR 93 | Ht 71.0 in | Wt 241.1 lb

## 2020-12-07 DIAGNOSIS — G4733 Obstructive sleep apnea (adult) (pediatric): Secondary | ICD-10-CM

## 2020-12-07 DIAGNOSIS — E782 Mixed hyperlipidemia: Secondary | ICD-10-CM | POA: Diagnosis not present

## 2020-12-07 DIAGNOSIS — I2510 Atherosclerotic heart disease of native coronary artery without angina pectoris: Secondary | ICD-10-CM

## 2020-12-07 DIAGNOSIS — I209 Angina pectoris, unspecified: Secondary | ICD-10-CM

## 2020-12-07 DIAGNOSIS — R079 Chest pain, unspecified: Secondary | ICD-10-CM | POA: Insufficient documentation

## 2020-12-07 DIAGNOSIS — I259 Chronic ischemic heart disease, unspecified: Secondary | ICD-10-CM

## 2020-12-07 HISTORY — DX: Chest pain, unspecified: R07.9

## 2020-12-07 MED ORDER — METOPROLOL SUCCINATE ER 25 MG PO TB24: 25.0000 mg | ORAL_TABLET | Freq: Every day | ORAL | 6 refills | Status: DC

## 2020-12-07 MED ORDER — NITROGLYCERIN 0.4 MG SL SUBL: 0.4000 mg | SUBLINGUAL_TABLET | SUBLINGUAL | 6 refills | Status: DC | PRN

## 2020-12-07 NOTE — Progress Notes (Signed)
ekg 

## 2020-12-07 NOTE — Telephone Encounter (Signed)
Patient given detailed instructions per Myocardial Perfusion Study Information Sheet for the test on 12/16/20. Patient notified to arrive 15 minutes early and that it is imperative to arrive on time for appointment to keep from having the test rescheduled.  If you need to cancel or reschedule your appointment, please call the office within 24 hours of your appointment. . Patient verbalized understanding. Kerstin Crusoe Jacqueline    

## 2020-12-07 NOTE — Patient Instructions (Signed)
Medication Instructions:  Your physician has recommended you make the following change in your medication:   Stop Zetia. Use Nitroglycerin as needed for chest pain. Take Toprol XL 25 mg daily.  *If you need a refill on your cardiac medications before your next appointment, please call your pharmacy*   Lab Work: None ordered If you have labs (blood work) drawn today and your tests are completely normal, you will receive your results only by: [name] MyChart Message (if you have MyChart) OR . A paper copy in the mail If you have any lab test that is abnormal or we need to change your treatment, we will call you to review the results.   Testing/Procedures: Your physician has requested that you have a lexiscan myoview. For further information please visit [email or web address]. Please follow instruction sheet, as given.  The test will take approximately 3 to 4 hours to complete; you may bring reading material.  If someone comes with you to your appointment, they will need to remain in the main lobby due to limited space in the testing area. **If you are pregnant or breastfeeding, please notify the nuclear lab prior to your appointment**  How to prepare for your Myocardial Perfusion Test: . Do not eat or drink 3 hours prior to your test, except you may have water. . Do not consume products containing caffeine (regular or decaffeinated) 12 hours prior to your test. (ex: coffee, chocolate, sodas, tea). Do bring a list of your current medications with you.  If not listed below, you may take your medications as normal. . Do not take metoprolol (Lopressor, Toprol) for 24 hours prior to the test.  Bring the medication to your appointment as you may be required to take it once the test is complete. . Do wear comfortable clothes (no dresses or overalls) and walking shoes, tennis shoes preferred (No heels or open toe shoes are allowed). . Do NOT wear cologne, perfume, aftershave, or lotions (deodorant is  allowed). . If these instructions are not followed, your test will have to be rescheduled.    Follow-Up: At [facility], you and your health needs are our priority.  As part of our continuing mission to provide you with exceptional heart care, we have created designated Provider Care Teams.  These Care Teams include your primary Cardiologist (physician) and Advanced Practice Providers (APPs -  Physician Assistants and Nurse Practitioners) who all work together to provide you with the care you need, when you need it.  We recommend signing up for the patient portal called "MyChart".  Sign up information is provided on this After Visit Summary.  MyChart is used to connect with patients for Virtual Visits (Telemedicine).  Patients are able to view lab/test results, encounter notes, upcoming appointments, etc.  Non-urgent messages can be sent to your provider as well.   To learn more about what you can do with MyChart, go to [email or web address].    Your next appointment:   1 month(s)  The format for your next appointment:   In Person  Provider:   Jyl Heinz, MD   Other Instructions Metoprolol Extended-Release Tablets What is this medicine? METOPROLOL (me TOE proe lole) is a beta blocker. It decreases the amount of work your heart has to do and helps your heart beat regularly. It treats high blood pressure and/or prevent chest pain (also called angina). It also treats heart failure. This medicine may be used for other purposes; ask your health care provider or pharmacist if you have questions. COMMON  BRAND NAME(S): toprol, Toprol XL What should I tell my health care provider before I take this medicine? They need to know if you have any of these conditions:  diabetes  heart or vessel disease like slow heart rate, worsening heart failure, heart block, sick sinus syndrome or Raynaud's disease  kidney disease  liver disease  lung or breathing disease, like asthma or  emphysema  pheochromocytoma  thyroid disease  an unusual or allergic reaction to metoprolol, other beta-blockers, medicines, foods, dyes, or preservatives  pregnant or trying to get pregnant  breast-feeding How should I use this medicine? Take this drug by mouth. Take it as directed on the prescription label at the same time every day. Take it with food. You may cut the tablet in half if it is scored (has a line in the middle of it). This may help you swallow the tablet if the whole tablet is too big. Be sure to take both halves. Do not take just one-half of the tablet. Keep taking it unless your health care provider tells you to stop. Talk to your health care provider about the use of this drug in children. While it may be prescribed for children as young as 6 for selected conditions, precautions do apply. Overdosage: If you think you have taken too much of this medicine contact a poison control center or emergency room at once. NOTE: This medicine is only for you. Do not share this medicine with others. What if I miss a dose? If you miss a dose, take it as soon as you can. If it is almost time for your next dose, take only that dose. Do not take double or extra doses. What may interact with this medicine? This medicine may interact with the following medications:  certain medicines for blood pressure, heart disease, irregular heart beat  certain medicines for depression, like monoamine oxidase (MAO) inhibitors, fluoxetine, or paroxetine  clonidine  dobutamine  epinephrine  isoproterenol  reserpine This list may not describe all possible interactions. Give your health care provider a list of all the medicines, herbs, non-prescription drugs, or dietary supplements you use. Also tell them if you smoke, drink alcohol, or use illegal drugs. Some items may interact with your medicine. What should I watch for while using this medicine? Visit your doctor or health care professional for  regular check ups. Contact your doctor right away if your symptoms worsen. Check your blood pressure and pulse rate regularly. Ask your health care professional what your blood pressure and pulse rate should be, and when you should contact them. You may get drowsy or dizzy. Do not drive, use machinery, or do anything that needs mental alertness until you know how this medicine affects you. Do not sit or stand up quickly, especially if you are an older patient. This reduces the risk of dizzy or fainting spells. Contact your doctor if these symptoms continue. Alcohol may interfere with the effect of this medicine. Avoid alcoholic drinks. This medicine may increase blood sugar. Ask your healthcare provider if changes in diet or medicines are needed if you have diabetes. What side effects may I notice from receiving this medicine? Side effects that you should report to your doctor or health care professional as soon as possible:  allergic reactions like skin rash, itching or hives  cold or numb hands or feet  depression  difficulty breathing  faint  fever with sore throat  irregular heartbeat, chest pain  rapid weight gain  signs and symptoms of  high blood sugar such as being more thirsty or hungry or having to urinate more than normal. You may also feel very tired or have blurry vision.  swollen legs or ankles Side effects that usually do not require medical attention (report to your doctor or health care professional if they continue or are bothersome):  anxiety or nervousness  change in sex drive or performance  dry skin  headache  nightmares or trouble sleeping  short term memory loss  stomach upset or diarrhea This list may not describe all possible side effects. Call your doctor for medical advice about side effects. You may report side effects to FDA at 1-800-FDA-1088. Where should I keep my medicine? Keep out of the reach of children and pets. Store at room temperature  between 20 and 25 degrees C (68 and 77 degrees F). Throw away any unused drug after the expiration date. NOTE: This sheet is a summary. It may not cover all possible information. If you have questions about this medicine, talk to your doctor, pharmacist, or health care provider.  2021 Elsevier/Gold Standard (2019-06-13 18:23:00) Nitroglycerin sublingual tablets What is this medicine? NITROGLYCERIN (nye troe GLI ser in) is a type of vasodilator. It relaxes blood vessels, increasing the blood and oxygen supply to your heart. This medicine is used to relieve chest pain caused by angina. It is also used to prevent chest pain before activities like climbing stairs, going outdoors in cold weather, or sexual activity. This medicine may be used for other purposes; ask your health care provider or pharmacist if you have questions. COMMON BRAND NAME(S): Nitroquick, Nitrostat, Nitrotab What should I tell my health care provider before I take this medicine? They need to know if you have any of these conditions:  anemia  head injury, recent stroke, or bleeding in the brain  liver disease  previous heart attack  an unusual or allergic reaction to nitroglycerin, other medicines, foods, dyes, or preservatives  pregnant or trying to get pregnant  breast-feeding How should I use this medicine? Take this medicine by mouth as needed. Use at the first sign of an angina attack (chest pain or tightness). You can also take this medicine 5 to 10 minutes before an event likely to produce chest pain. Follow the directions exactly as written on the prescription label. Place one tablet under your tongue and let it dissolve. Do not swallow whole. Replace the dose if you accidentally swallow it. It will help if your mouth is not dry. Saliva around the tablet will help it to dissolve more quickly. Do not eat or drink, smoke or chew tobacco while a tablet is dissolving. Sit down when taking this medicine. In an angina  attack, you should feel better within 5 minutes after your first dose. You can take a dose every 5 minutes up to a total of 3 doses. If you do not feel better or feel worse after 1 dose, call 9-1-1 at once. Do not take more than 3 doses in 15 minutes. Your health care provider might give you other directions. Follow those directions if he or she does. Do not take your medicine more often than directed. Talk to your health care provider about the use of this medicine in children. Special care may be needed. Overdosage: If you think you have taken too much of this medicine contact a poison control center or emergency room at once. NOTE: This medicine is only for you. Do not share this medicine with others. What if I miss  a dose? This does not apply. This medicine is only used as needed. What may interact with this medicine? Do not take this medicine with any of the following medications:  certain migraine medicines like ergotamine and dihydroergotamine (DHE)  medicines used to treat erectile dysfunction like sildenafil, tadalafil, and vardenafil  riociguat This medicine may also interact with the following medications:  alteplase  aspirin  heparin  medicines for high blood pressure  medicines for mental depression  other medicines used to treat angina  phenothiazines like chlorpromazine, mesoridazine, prochlorperazine, thioridazine This list may not describe all possible interactions. Give your health care provider a list of all the medicines, herbs, non-prescription drugs, or dietary supplements you use. Also tell them if you smoke, drink alcohol, or use illegal drugs. Some items may interact with your medicine. What should I watch for while using this medicine? Tell your doctor or health care professional if you feel your medicine is no longer working. Keep this medicine with you at all times. Sit or lie down when you take your medicine to prevent falling if you feel dizzy or faint  after using it. Try to remain calm. This will help you to feel better faster. If you feel dizzy, take several deep breaths and lie down with your feet propped up, or bend forward with your head resting between your knees. You may get drowsy or dizzy. Do not drive, use machinery, or do anything that needs mental alertness until you know how this drug affects you. Do not stand or sit up quickly, especially if you are an older patient. This reduces the risk of dizzy or fainting spells. Alcohol can make you more drowsy and dizzy. Avoid alcoholic drinks. Do not treat yourself for coughs, colds, or pain while you are taking this medicine without asking your doctor or health care professional for advice. Some ingredients may increase your blood pressure. What side effects may I notice from receiving this medicine? Side effects that you should report to your doctor or health care professional as soon as possible:  allergic reactions (skin rash, itching or hives; swelling of the face, lips, or tongue)  low blood pressure (dizziness; feeling faint or lightheaded, falls; unusually weak or tired)  low red blood cell counts (trouble breathing; feeling faint; lightheaded, falls; unusually weak or tired) Side effects that usually do not require medical attention (report to your doctor or health care professional if they continue or are bothersome):  facial flushing (redness)  headache  nausea, vomiting This list may not describe all possible side effects. Call your doctor for medical advice about side effects. You may report side effects to FDA at 1-800-FDA-1088. Where should I keep my medicine? Keep out of the reach of children. Store at room temperature between 20 and 25 degrees C (68 and 77 degrees F). Store in [employer]. Protect from light and moisture. Keep tightly closed. Throw away any unused medicine after the expiration date. NOTE: This sheet is a summary. It may not cover all possible  information. If you have questions about this medicine, talk to your doctor, pharmacist, or health care provider.  2021 Elsevier/Gold Standard (2018-08-01 16:46:32)  Exercise Stress Test An exercise stress test is a test to check how your heart works during exercise. You will need to walk on a treadmill or ride an exercise bike for this test. An electrocardiogram (ECG) will record your heartbeat when you are at rest and when you are exercising. You may have an ultrasound or nuclear test  after the exercise test. The test is done to check for coronary artery disease (CAD). It is also done to:  See how well you can exercise.  Watch for high blood pressure during exercise.  Test how well you can exercise after treatment.  Check the blood flow to your arms and legs. If your test result is not normal, more testing may be needed. What happens before the procedure?  Follow instructions from your doctor about what you cannot eat or drink. ? Do not have any drinks or foods that have caffeine in them for 24 hours before the test, or as told by your doctor. This includes coffee, tea (even decaf tea), sodas, chocolate, and cocoa.  Ask your doctor about changing or stopping your normal medicines. This is important if you: ? Take diabetes medicines. ? Take beta-blocker medicines. ? Wear a nitroglycerin patch.  If you use an inhaler, bring it with you to the test.  Do not put lotions, powders, creams, or oils on your chest before the test.  Wear comfortable shoes and clothing.  Do not use any products that have nicotine or tobacco in them, such as cigarettes and e-cigarettes. Stop using them at least 4 hours before the test. If you need help quitting, ask your doctor. What happens during the procedure?  Patches (electrodes) will be put on your chest.  Wires will be connected to the patches. The wires will send signals to a machine to record your heartbeat.  Your heart rate will be watched while  you are resting and while you are exercising. Your blood pressure will also be watched during the test.  You will walk on a treadmill or use an exercise bike. If you cannot use these, you may be asked to turn a crank with your hands.  The activity will get harder and will raise your heart rate.  You may be asked to breathe into a tube a few times during the test. This measures the gases that you breathe out.  You will be asked how you are feeling throughout the test.  You will exercise until your heart reaches a target heart rate. You will stop early if: ? You feel dizzy. ? You have chest pain. ? You are out of breath. ? Your blood pressure is too high or too low. ? You have an irregular heartbeat. ? You have pain or aching in your arms or legs. The procedure may vary among doctors and hospitals.   What happens after the procedure?  Your blood pressure, heart rate, breathing rate, and blood oxygen level will be watched after the test.  You may return to your normal diet and activities as told by your doctor.  It is up to you to get the results of your test. Ask your doctor, or the department that is doing the test, when your results will be ready. Summary  An exercise stress test is a test to check how your heart works during exercise.  This test is done to check for coronary artery disease.  Your heart rate will be watched while you are resting and while you are exercising.  Follow instructions from your doctor about what you cannot eat or drink before the test. This information is not intended to replace advice given to you by your health care provider. Make sure you discuss any questions you have with your health care provider. Document Revised: 07/03/2020 Document Reviewed: 07/03/2020 Elsevier Patient Education  [place].

## 2020-12-07 NOTE — Progress Notes (Signed)
Cardiology Office Note:    Date:  12/07/2020   ID:  [name], DOB January 05, 1951, MRN [id number]  PCP:  Cassandria Anger, MD  Cardiologist:  Jenean Lindau, MD   Referring MD: Cassandria Anger, MD    ASSESSMENT:    1. Coronary artery disease involving native coronary artery of native heart without angina pectoris   2. OSA (obstructive sleep apnea)   3. Mixed hyperlipidemia    PLAN:    In order of problems listed above:  1. Coronary artery disease: Chest pain: Secondary prevention stressed with the patient.  Importance of compliance with diet medication stressed any vocalized understanding.  His symptoms are atypical.  He has multiple risk factors and existing coronary artery disease so following recommendations were made to him.  Sublingual nitroglycerin prescription was sent, its protocol and 911 protocol explained and the patient vocalized understanding questions were answered to the patient's satisfaction.  He was also advised to take Toprol-XL 25 mg every morning.  He knows to go to the nearest emergency room for any concerning symptoms.  To evaluate this in a physiological manner I will do an exercise Cardiolite. 2. Mixed dyslipidemia: Diet was emphasized.  He cannot tolerate Zetia and he has muscle aches.  I told him to stop the medication for now.  Diet was emphasized.  [place] lab work was reviewed.  Hemoglobin A1c was mildly elevated and I discussed this with him and advised him to talk about it with his primary care physician. 3. Obesity: Weight reduction was stressed and he promises to do better. 4. Sleep apnea: Sleep health issues were discussed. 5. Patient will be seen in follow-up appointment in 4 weeks or earlier if the patient has any concerns    Medication Adjustments/Labs and Tests Ordered: Current medicines are reviewed at length with the patient today.  Concerns regarding medicines are outlined above.  No orders of the defined types were placed in this  encounter.  No orders of the defined types were placed in this encounter.    No chief complaint on file.    History of Present Illness:    [name] is a 70 y.o. male.  Patient has past medical history of coronary artery disease, nonobstructive by nature in 2018.  He denies any orthopnea or PND.  He tells me that he has a fluttering sensation in his chest and at that time he has chest pain.  No orthopnea no PND no pressure-like sensation no radiation to any part of the body.  At the time of my evaluation, the patient is alert awake oriented and in no distress.  Past Medical History:  Diagnosis Date  . Actinic keratosis   . Acute sinusitis 06/21/2010   9/14, 10/19   . Allergy   . Anxiety 08/24/2017   Chronic  Worse in 10/18 Xanax prn - d/c  Potential benefits of a long term benzodiazepines  use as well as potential risks  and complications were explained to the patient and were aknowledged. 11/18 Lexapro    . Arthralgia 04/14/2017   2018 2019 Rheum consult; Gluten free trial Dr Estanislado Pandy 12[facility] Depo-medrol IM Increase Tramadol w/caution  Potential benefits of a long term opioids use as well as potential risks (i.e. addiction risk, apnea etc) and complications (i.e. Somnolence, constipation and others) were explained to the patient and were aknowledged.  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 Bladder neck obstruction 02/03/2015   3/16   . CAD (coronary artery disease) 07/19/2012   Nonobstructive - CT 9/13 Dr Johnsie Cancel Nl Dob ECHO - 8/13  Statins discussed (he is on red rice yeast) - he declined Rx 2017 Pravastatin d/c On ASA 10/18 Dr Geraldo Pitter - heart cath w/CAD. Pravastatin - pt stopped due to side effects 1/21 Zetia - causing pain too Krill oil d/c.  Repatha option discussed.  9/21 Vascepa, Zetia  .  Chondrocalcinosis 07/06/2018  . Colonic polyp   . COLONIC POLYPS, HX OF   . CONTACT DERMATITIS DUE TO SOLVENTS   . DDD (degenerative disc disease), cervical 07/06/2018  . DDD (degenerative disc disease), lumbar 07/06/2018   and facet joint arthropathy  . Dizzinesses 07/18/2017   9/18 I suggested a brain MRI to r/o acustic neuroma etc: he will think about it and check on cost ENT ref suggested  . DOE (dyspnea on exertion) 07-25-12   related to eating shrimp  . Dyslipidemia 06/21/2013   Chronic  Elev TG, low HDL, nl LDL Pravastatin - pt stopped due to side effects 1/21 Repatha option discussed. On Zetia - c/o side effeects 9/21 Vascepa, Zetia  . Fatigue 06/09/2015   2016 no OSA  2018 much better (60%) off Reglan.   . Folliculitis 04/19/629  . GERD 05/09/2008   Chronic  -- Protonix 5/18 We discussed his GERD/Barrett's treated at [facility] (Dr Ferdinand Lango). Reglan was d/c'd due to CP. He was dx'd w/pancreatitis and started on Zenpep He had an abd CT, [place], EGD and colonoscopy. He would like to have a 2nd opinion here - will refer  . GERD (gastroesophageal reflux disease)   . 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 HAND PAIN   . Heart murmur   . HERPES, GENITAL NOS   . Hyperglycemia 06/09/2015   Mild    . Hyperlipidemia   . Hypogonadism in male 04/25/2018   Declined testosterone Rx  . Ingrowing toenail of left foot 07-25-12   is resolved now 07-25-12  . KNEE PAIN   . Laryngopharyngeal reflux (LPR) 10/02/2018  . Localized swelling, mass or lump of neck 09/05/2018  . Low back pain 06/09/2015   Per Dr Alvan Dame 3/17 - planning to see Dr Annette Stable Body pains are much better (60%) off Reglan. Off Tramadol 2019 Rheum consult; Gluten free trial Tramadol  Potential benefits of a long term opioids use as well as potential risks (i.e. addiction risk, apnea etc) and complications (i.e. Somnolence, constipation and others) were explained to the patient and were aknowledged.  . Nausea 07/18/2017   Chronic  w/dizziness I suggested a brain MRI to r/o acustic neuroma etc: he will think about it and check on cost 9/18  . Neoplasm of uncertain behavior of skin   . OA (osteoarthritis) of knee   . ONYCHOMYCOSIS   . OSA (obstructive sleep apnea) 08/21/2013   2014 no need for CPAP   . OSTEOARTHRITIS   . Preop exam for internal medicine   . Primary osteoarthritis of both hands 11/21/2008   Chronic and severe Tramadol  Potential benefits of a long term opioids use as well as potential risks (i.e. addiction risk, apnea etc) and complications (i.e. Somnolence, constipation and others) were explained to the patient and were aknowledged.   10/18 Body pains are much better (60%) off Reglan. Off  Tramadol  . Rash and nonspecific skin eruption 07/27/2018   2019 x1 year  Gluten free trial  . Rosacea 11/19/2018  . 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 Scrotal mass 06/09/2015   L side ?varicocele 2016   . SINUSITIS, ACUTE   . Sinusitis, chronic 08/12/2013    Sinus congestion, SOB - better after surgery - nov 2014 9/14 worse CT IMPRESSION:  Minimal sinus mucosal thickening, primarily in the frontal recesses.  Small right maxillary mucous retention cyst. No CT evidence of acute  sinusitis.  Electronically Signed  By: Lars Pinks M.D.  On: 08/15/2013 09:19  Doxy x 4 wks  . Tinnitus 12/13/2018   2020 S/p ENT eval x2  . Tremor 09/15/2017   10/18 sx's the pt is attributing to Reglan withdrawal (he had his last Reglan dose in early September 2018): tremor, twitching, anxiety, depression, fatigue, pounding heart, blinking, lip smacking etc... Xanax prn - d/c  Potential benefits of a long term benzodiazepines  use as well as potential risks  and complications were explained to the patient and were aknowledged. Neurol ref - Dr Tat:   "Hi  . Upper respiratory infection 07/27/2018   9/19, 12/21  . Well adult exam     Past Surgical History:   Procedure Laterality Date  . COLONOSCOPY  04/24/2019  . JOINT REPLACEMENT  9/13   L TKR  . KNEE ARTHROSCOPY     Left  . LEFT HEART CATH AND CORONARY ANGIOGRAPHY N/A 08/31/2017   Procedure: LEFT HEART CATH AND CORONARY ANGIOGRAPHY;  Surgeon: Nelva Bush, MD;  Location: [facility];  Service: Cardiovascular;  Laterality: N/A;  . TOTAL KNEE ARTHROPLASTY  07/31/2012   Procedure: TOTAL KNEE ARTHROPLASTY;  Surgeon: Sydnee Cabal, MD;  Location: [facility];  Service: Orthopedics;  Laterality: Left;  . UMBILICAL HERNIA REPAIR  07-25-12   10 yrs ago  . UPPER GASTROINTESTINAL ENDOSCOPY  04/24/2019    Current Medications: Current Meds  Medication Sig  . acetaminophen (TYLENOL) 500 MG tablet Take 500 mg by mouth every 6 (six) hours as needed for headache.  . Alum Hydroxide-Mag Carbonate (GAVISCON PO) Take 1 tablet by mouth daily as needed (indigestion).  . Ascorbic Acid (VITAMIN C) 1000 MG tablet Take 1,000 mg by mouth daily.  [name] aspirin 81 MG tablet Take 81 mg by mouth at bedtime.  . cholecalciferol (VITAMIN D) 1000 units tablet Take 1,000 Units by mouth daily.  . Clotrimazole (LOTRIMIN AF JOCK ITCH EX) Apply 1 application topically daily as needed (sweat).  . Cyanocobalamin (VITAMIN B-12) 2500 MCG SUBL Place 5,000 mcg under the tongue daily.  [name] doxycycline (VIBRA-TABS) 100 MG tablet Take 1 tablet (100 mg total) by mouth 2 (two) times daily.  . EQ LORATADINE 10 MG tablet TAKE 1 TABLET BY MOUTH ONCE DAILY AS NEEDED FOR  ALLERGIES  . escitalopram (LEXAPRO) 10 MG tablet Take 1 tablet by mouth once daily  . ezetimibe (ZETIA) 10 MG tablet Take 1 tablet by mouth once daily  . fluticasone (FLONASE) 50 MCG/ACT nasal spray Use 1 spray(s) in each nostril once daily  . Ginger, Zingiber officinalis, (GINGER ROOT PO) Take by mouth.  . icosapent Ethyl (VASCEPA) 1 g capsule Take 2 capsules (2 g total) by mouth 2 (two) times daily.  . meloxicam (MOBIC) 7.5 MG tablet Take 2 tablets by mouth once daily   . Misc Natural Products (  TART CHERRY ADVANCED PO) Take by mouth.  . Multiple Vitamin (MULTIVITAMIN WITH MINERALS) TABS tablet Take 1 tablet by mouth daily.  . ondansetron (ZOFRAN) 4 MG tablet TAKE 1 TO 2 TABLETS BY MOUTH EVERY 4 TO 6 HOURS AS NEEDED FOR NAUSEA (PATIENT  NEEDS  OFFICE  VISIT  FOR  FURTHER  REFILLS)  . OVER THE COUNTER MEDICATION Flax Seed Oil, One capsule daily.  [name] OVER THE COUNTER MEDICATION Fish Oil , one capsule daily.  . pantoprazole (PROTONIX) 40 MG tablet Take 1 tablet by mouth twice daily  . traMADol (ULTRAM) 50 MG tablet TAKE 1 TO 2 TABLETS BY MOUTH THREE TIMES DAILY AS NEEDED  . triamcinolone ointment (KENALOG) 0.5 % APPLY OINTMENT TOPICALLY TWICE DAILY  . Turmeric 500 MG TABS Take 500 mg by mouth 2 (two) times daily.  . valACYclovir (VALTREX) 500 MG tablet Take 1 tablet by mouth once daily     Allergies:   Hytrin [terazosin], Levaquin [levofloxacin in d5w], Lipitor [atorvastatin], Penicillins, Reglan [metoclopramide], and Xanax [alprazolam]   Social History   Socioeconomic History  . Marital status: Married    Spouse name: Not on file  . Number of children: 2  . Years of education: Not on file  . Highest education level: Not on file  Occupational History  . Occupation: retired  Tobacco Use  . Smoking status: Former Smoker    Types: Cigars    Quit date: 08/30/2007    Years since quitting: 13.2  . Smokeless tobacco: Never Used  . Tobacco comment: occ. cigar  Vaping Use  . Vaping Use: Never used  Substance and Sexual Activity  . Alcohol use: Yes    Comment: occ.   . Drug use: Yes    Types: Marijuana    Comment: last marijuana this am, 2 "hits" per patient  . Sexual activity: Yes    Partners: Female    Birth control/protection: None  Other Topics Concern  . Not on file  Social History Narrative  . Not on file   Social Determinants of Health   Financial Resource Strain: Low Risk   . Difficulty of Paying Living Expenses: Not hard at all  Food  Insecurity: No Food Insecurity  . Worried About Charity fundraiser in the Last Year: Never true  . Ran Out of Food in the Last Year: Never true  Transportation Needs: No Transportation Needs  . Lack of Transportation (Medical): No  . Lack of Transportation (Non-Medical): No  Physical Activity: Sufficiently Active  . Days of Exercise per Week: 5 days  . Minutes of Exercise per Session: 30 min  Stress: No Stress Concern Present  . Feeling of Stress : Not at all  Social Connections: Unknown  . Frequency of Communication with Friends and Family: More than three times a week  . Frequency of Social Gatherings with Friends and Family: More than three times a week  . Attends Religious Services: Patient refused  . Active Member of Clubs or Organizations: Patient refused  . Attends Archivist Meetings: Patient refused  . Marital Status: Married     Family History: The patient's family history includes Colon cancer in his brother; Colon polyps in his brother; Coronary artery disease in an other family member; Diabetes (age of onset: 36) in his mother; Healthy in his daughter; Heart disease (age of onset: 62) in his father; Heart disease (age of onset: 74) in his brother; Lung cancer in his brother. There is no history of Stomach cancer,  Rectal cancer, or Esophageal cancer.  ROS:   Please see the history of present illness.    All other systems reviewed and are negative.  EKGs/Labs/Other Studies Reviewed:    The following studies were reviewed today: EKG reveals sinus rhythm and nonspecific ST-T changes   Recent Labs: 07/28/2020: ALT 32; BUN 17; Creatinine, Ser 0.81; Hemoglobin 14.9; Platelets 221.0; Potassium 4.0; Sodium 138; TSH 3.51  Recent Lipid Panel    Component Value Date/Time   CHOL 175 07/28/2020 0840   CHOL 186 10/07/2019 0910   TRIG 129.0 07/28/2020 0840   HDL 38.20 (L) 07/28/2020 0840   HDL 30 (L) 10/07/2019 0910   CHOLHDL 5 07/28/2020 0840   VLDL 25.8 07/28/2020  0840   LDLCALC 111 (H) 07/28/2020 0840   LDLCALC 94 10/07/2019 0910   LDLDIRECT 110 (H) 10/07/2019 0910   LDLDIRECT 151.0 04/25/2018 0942    Physical Exam:    VS:  BP 134/82   Pulse 93   Ht 5\' 11"  (1.803 m)   Wt 241 lb 1.3 oz (109.4 kg)   SpO2 97%   BMI 33.62 kg/m     Wt Readings from Last 3 Encounters:  12/07/20 241 lb 1.3 oz (109.4 kg)  11/10/20 241 lb (109.3 kg)  08/11/20 233 lb (105.7 kg)     GEN: Patient is in no acute distress HEENT: Normal NECK: No JVD; No carotid bruits LYMPHATICS: No lymphadenopathy CARDIAC: Hear sounds regular, 2/6 systolic murmur at the apex. RESPIRATORY:  Clear to auscultation without rales, wheezing or rhonchi  ABDOMEN: Soft, non-tender, non-distended MUSCULOSKELETAL:  No edema; No deformity  SKIN: Warm and dry NEUROLOGIC:  Alert and oriented x 3 PSYCHIATRIC:  Normal affect   Signed, Jenean Lindau, MD  12/07/2020 8:37 AM    [place]

## 2020-12-07 NOTE — Addendum Note (Signed)
Addended by: Truddie Hidden on: 12/07/2020 12:47 PM   Modules accepted: Orders

## 2020-12-11 NOTE — Addendum Note (Signed)
Addended by: Jyl Heinz R on: 12/11/2020 04:07 PM   Modules accepted: Orders

## 2020-12-14 ENCOUNTER — Other Ambulatory Visit (HOSPITAL_COMMUNITY)
Admission: RE | Admit: 2020-12-14 | Discharge: 2020-12-14 | Disposition: A | Discharge status: Home / Self Care | Payer: Medicare HMO | Source: Ambulatory Visit | Attending: Cardiology | Admitting: Cardiology

## 2020-12-14 DIAGNOSIS — Z20822 Contact with and (suspected) exposure to covid-19: Secondary | ICD-10-CM | POA: Insufficient documentation

## 2020-12-14 DIAGNOSIS — Z01812 Encounter for preprocedural laboratory examination: Secondary | ICD-10-CM | POA: Insufficient documentation

## 2020-12-14 LAB — SARS CORONAVIRUS 2 (TAT 6-24 HRS): SARS Coronavirus 2: NEGATIVE

## 2020-12-16 ENCOUNTER — Other Ambulatory Visit: Payer: Self-pay

## 2020-12-16 ENCOUNTER — Ambulatory Visit (HOSPITAL_COMMUNITY): Payer: Medicare HMO | Attending: Cardiovascular Disease

## 2020-12-16 DIAGNOSIS — I2510 Atherosclerotic heart disease of native coronary artery without angina pectoris: Secondary | ICD-10-CM

## 2020-12-16 DIAGNOSIS — I209 Angina pectoris, unspecified: Secondary | ICD-10-CM | POA: Diagnosis not present

## 2020-12-16 DIAGNOSIS — I259 Chronic ischemic heart disease, unspecified: Secondary | ICD-10-CM

## 2020-12-16 DIAGNOSIS — R079 Chest pain, unspecified: Secondary | ICD-10-CM | POA: Diagnosis not present

## 2020-12-16 LAB — MYOCARDIAL PERFUSION IMAGING
Estimated workload: 8.5 METS
Exercise duration (min): 7 min
Exercise duration (sec): 1 s
LV dias vol: 92 mL (ref 62–150)
LV sys vol: 38 mL
MPHR: 153 {beats}/min
Peak HR: 144 {beats}/min
Percent HR: 94 %
Rest HR: 80 {beats}/min
SDS: 1
SRS: 0
SSS: 1
TID: 0.87

## 2020-12-16 MED ORDER — TECHNETIUM TC 99M TETROFOSMIN IV KIT
30.5000 | PACK | Freq: Once | INTRAVENOUS | Status: AC | PRN
  Administered 2020-12-16: 30.5 via INTRAVENOUS
  Filled 2020-12-16: qty 31

## 2020-12-16 MED ORDER — TECHNETIUM TC 99M TETROFOSMIN IV KIT
11.0000 | PACK | Freq: Once | INTRAVENOUS | Status: AC | PRN
  Administered 2020-12-16: 11 via INTRAVENOUS
  Filled 2020-12-16: qty 11

## 2020-12-19 ENCOUNTER — Other Ambulatory Visit: Payer: Self-pay | Admitting: Internal Medicine

## 2020-12-19 DIAGNOSIS — J320 Chronic maxillary sinusitis: Secondary | ICD-10-CM

## 2020-12-24 DIAGNOSIS — H6122 Impacted cerumen, left ear: Secondary | ICD-10-CM

## 2020-12-24 DIAGNOSIS — R0989 Other specified symptoms and signs involving the circulatory and respiratory systems: Secondary | ICD-10-CM | POA: Diagnosis not present

## 2020-12-24 DIAGNOSIS — H903 Sensorineural hearing loss, bilateral: Secondary | ICD-10-CM

## 2020-12-24 HISTORY — DX: Impacted cerumen, left ear: H61.22

## 2020-12-24 HISTORY — DX: Sensorineural hearing loss, bilateral: H90.3

## 2020-12-28 DIAGNOSIS — J329 Chronic sinusitis, unspecified: Secondary | ICD-10-CM | POA: Diagnosis not present

## 2020-12-28 DIAGNOSIS — J984 Other disorders of lung: Secondary | ICD-10-CM | POA: Diagnosis not present

## 2020-12-28 DIAGNOSIS — J330 Polyp of nasal cavity: Secondary | ICD-10-CM | POA: Diagnosis not present

## 2020-12-28 DIAGNOSIS — J342 Deviated nasal septum: Secondary | ICD-10-CM | POA: Diagnosis not present

## 2020-12-28 DIAGNOSIS — J321 Chronic frontal sinusitis: Secondary | ICD-10-CM | POA: Diagnosis not present

## 2021-01-04 ENCOUNTER — Other Ambulatory Visit: Payer: Self-pay | Admitting: Internal Medicine

## 2021-01-07 ENCOUNTER — Other Ambulatory Visit: Payer: Self-pay

## 2021-01-07 ENCOUNTER — Ambulatory Visit: Payer: Medicare HMO | Admitting: Cardiology

## 2021-01-07 ENCOUNTER — Encounter: Payer: Self-pay | Admitting: Cardiology

## 2021-01-07 VITALS — BP 132/82 | HR 88 | Ht 71.0 in | Wt 248.1 lb

## 2021-01-07 DIAGNOSIS — I2510 Atherosclerotic heart disease of native coronary artery without angina pectoris: Secondary | ICD-10-CM

## 2021-01-07 DIAGNOSIS — E66811 Obesity, class 1: Secondary | ICD-10-CM | POA: Insufficient documentation

## 2021-01-07 DIAGNOSIS — E669 Obesity, unspecified: Secondary | ICD-10-CM | POA: Diagnosis not present

## 2021-01-07 DIAGNOSIS — E782 Mixed hyperlipidemia: Secondary | ICD-10-CM | POA: Diagnosis not present

## 2021-01-07 HISTORY — DX: Obesity, class 1: E66.811

## 2021-01-07 HISTORY — DX: Obesity, unspecified: E66.9

## 2021-01-07 NOTE — Progress Notes (Signed)
Cardiology Office Note:    Date:  01/07/2021   ID:  [name], DOB 1951/11/03, MRN [id number]  PCP:  Cassandria Anger, MD  Cardiologist:  Jenean Lindau, MD   Referring MD: Cassandria Anger, MD    ASSESSMENT:    1. Coronary artery disease involving native coronary artery of native heart without angina pectoris   2. Mixed hyperlipidemia   3. Obesity (BMI 30.0-34.9)    PLAN:    In order of problems listed above:  1. Coronary artery disease: Secondary prevention stressed with the patient.  Importance of compliance with diet medication stressed and he vocalized understanding.  I told him to walk at least half an hour a day 5 days a week and he promises to do so. 2. Essential hypertension: Blood pressure stable and diet was emphasized.  Lifestyle modification was urged. 3. Mixed dyslipidemia: Lipids were reviewed: He will come back in the next few days for fasting blood work.  He is okay with starting Zetia 5 mg daily.  He tells me the 10 mg does not work with him.  He does not want referral because it gives him flutter sensation in the heart.  I will review his medications and consider medications like Nexletol or injectable PCSK9 medications 4. Obesity: Weight reduction was stressed and diet was emphasized.  He promises to do better. 5. Patient will be seen in follow-up appointment in 6 months or earlier if the patient has any concerns    Medication Adjustments/Labs and Tests Ordered: Current medicines are reviewed at length with the patient today.  Concerns regarding medicines are outlined above.  Orders Placed This Encounter  Procedures  . Basic metabolic panel  . CBC  . TSH  . Hepatic function panel  . Lipid panel  . Hemoglobin A1c   No orders of the defined types were placed in this encounter.    No chief complaint on file.    History of Present Illness:    [name] is a 70 y.o. male.  Patient has past medical history of essential hypertension,  dyslipidemia, obesity and nonobstructive coronary artery disease.  He denies any problems at this time and takes care of activities of daily living.  No chest pain orthopnea or PND.  He has dropped fish oil and lipid-lowering medications because of intolerance.  He mentions to me that he has tried several lipid-lowering medications in the past.  At the time of my evaluation, the patient is alert awake oriented and in no distress.   Past Medical History:  Diagnosis Date  . Actinic keratosis   . Acute sinusitis 06/21/2010   9/14, 10/19   . Allergy   . Anxiety 08/24/2017   Chronic  Worse in 10/18 Xanax prn - d/c  Potential benefits of a long term benzodiazepines  use as well as potential risks  and complications were explained to the patient and were aknowledged. 11/18 Lexapro    . Arthralgia 04/14/2017   2018 2019 Rheum consult; Gluten free trial Dr Estanislado Pandy 12[facility] Depo-medrol IM Increase Tramadol w/caution  Potential benefits of a long term opioids use as well as potential risks (i.e. addiction risk, apnea etc) and complications (i.e. Somnolence, constipation and others) were explained to the patient and were aknowledged.  . 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 Barrett esophagus 07/20/2012   Chronic  Dr Ferdinand Lango - stopped seeing Dr Harlow Asa, Reglan d/c CP in December 2017 req  ER visit (due to taking too much Metoclopramide for GERD) f/u  . Bladder neck obstruction 02/03/2015   3/16   . CAD (coronary artery disease) 07/19/2012   Nonobstructive - CT 9/13 Dr Johnsie Cancel Nl Dob ECHO - 8/13  Statins discussed (he is on red rice yeast) - he declined Rx 2017 Pravastatin d/c On ASA 10/18 Dr Geraldo Pitter - heart cath w/CAD. Pravastatin - pt stopped due to side effects 1/21 Zetia - causing pain too Krill oil d/c.  Repatha option discussed.  9/21 Vascepa, Zetia  . Chest pain 12/07/2020  . Chondrocalcinosis 07/06/2018  . Chronic sinusitis 08/12/2013     Sinus congestion, SOB - better after surgery - nov 2014 9/14 worse CT IMPRESSION:  Minimal sinus mucosal thickening, primarily in the frontal recesses.  Small right maxillary mucous retention cyst. No CT evidence of acute  sinusitis.  Electronically Signed  By: Lars Pinks M.D.  On: 08/15/2013 09:19  Doxy x 4 wks  . Colonic polyp   . COLONIC POLYPS, HX OF   . CONTACT DERMATITIS DUE TO SOLVENTS   . DDD (degenerative disc disease), cervical 07/06/2018  . DDD (degenerative disc disease), lumbar 07/06/2018   and facet joint arthropathy  . Dizzinesses 07/18/2017   9/18 I suggested a brain MRI to r/o acustic neuroma etc: he will think about it and check on cost ENT ref suggested  . DOE (dyspnea on exertion) 07-25-12   related to eating shrimp  . Dyslipidemia 06/21/2013   Chronic  Elev TG, low HDL, nl LDL Pravastatin - pt stopped due to side effects 1/21 Repatha option discussed. On Zetia - c/o side effeects 9/21 Vascepa, Zetia  . Fatigue 06/09/2015   2016 no OSA  2018 much better (60%) off Reglan.   . Folliculitis 5/0/5397  . GERD 05/09/2008   Chronic  -- Protonix 5/18 We discussed his GERD/Barrett's treated at [facility] (Dr Ferdinand Lango). Reglan was d/c'd due to CP. He was dx'd w/pancreatitis and started on Zenpep He had an abd CT, [place], EGD and colonoscopy. He would like to have a 2nd opinion here - will refer  . GERD (gastroesophageal reflux disease)   . 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 HAND PAIN   . Heart murmur   . HERPES, GENITAL NOS   . Hyperglycemia 06/09/2015   Mild    . Hyperlipidemia   . Hypogonadism in male 04/25/2018   Declined testosterone Rx  . Impacted cerumen of left ear 12/24/2020  . Ingrowing toenail of left foot 07-25-12   is resolved now 07-25-12  . KNEE PAIN   . Laryngopharyngeal reflux (LPR) 10/02/2018  . Localized swelling, mass or lump of neck 09/05/2018  . Low back pain 06/09/2015   Per Dr Alvan Dame 3/17 - planning to see Dr Annette Stable Body pains are much better (60%)  off Reglan. Off Tramadol 2019 Rheum consult; Gluten free trial Tramadol  Potential benefits of a long term opioids use as well as potential risks (i.e. addiction risk, apnea etc) and complications (i.e. Somnolence, constipation and others) were explained to the patient and were aknowledged.  . Nausea 07/18/2017   Chronic w/dizziness I suggested a brain MRI to r/o acustic neuroma etc: he will think about it and check on cost 9/18  . Neoplasm of uncertain behavior of skin   . OA (osteoarthritis) of knee   . ONYCHOMYCOSIS   . OSA (obstructive sleep apnea) 08/21/2013   2014 no need for CPAP   . OSTEOARTHRITIS   .  Preop exam for internal medicine   . Primary osteoarthritis of both hands 11/21/2008   Chronic and severe Tramadol  Potential benefits of a long term opioids use as well as potential risks (i.e. addiction risk, apnea etc) and complications (i.e. Somnolence, constipation and others) were explained to the patient and were aknowledged.   10/18 Body pains are much better (60%) off Reglan. Off Tramadol  . Rash and nonspecific skin eruption 07/27/2018   2019 x1 year  Gluten free trial  . Rosacea 11/19/2018  . 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 Scrotal mass 06/09/2015   L side ?varicocele 2016   . SINUSITIS, ACUTE   . Sinusitis, chronic 08/12/2013    Sinus congestion, SOB - better after surgery - nov 2014 9/14 worse CT IMPRESSION:  Minimal sinus mucosal thickening, primarily in the frontal recesses.  Small right maxillary mucous retention cyst. No CT evidence of acute  sinusitis.  Electronically Signed  By: Lars Pinks M.D.  On: 08/15/2013 09:19  Doxy x 4 wks  . Tinnitus 12/13/2018   2020 S/p ENT eval x2  . Tinnitus of both ears 12/13/2018   2020 S/p ENT eval x2  . Tremor 09/15/2017   10/18 sx's the pt is attributing to Reglan withdrawal (he had his last Reglan dose in early September 2018): tremor, twitching,  anxiety, depression, fatigue, pounding heart, blinking, lip smacking etc... Xanax prn - d/c  Potential benefits of a long term benzodiazepines  use as well as potential risks  and complications were explained to the patient and were aknowledged. Neurol ref - Dr Tat:   "Hi  . Upper respiratory infection 07/27/2018   9/19, 12/21  . Well adult exam     Past Surgical History:  Procedure Laterality Date  . COLONOSCOPY  04/24/2019  . JOINT REPLACEMENT  9/13   L TKR  . KNEE ARTHROSCOPY     Left  . LEFT HEART CATH AND CORONARY ANGIOGRAPHY N/A 08/31/2017   Procedure: LEFT HEART CATH AND CORONARY ANGIOGRAPHY;  Surgeon: Nelva Bush, MD;  Location: [facility];  Service: Cardiovascular;  Laterality: N/A;  . TOTAL KNEE ARTHROPLASTY  07/31/2012   Procedure: TOTAL KNEE ARTHROPLASTY;  Surgeon: Sydnee Cabal, MD;  Location: [facility];  Service: Orthopedics;  Laterality: Left;  . UMBILICAL HERNIA REPAIR  07-25-12   10 yrs ago  . UPPER GASTROINTESTINAL ENDOSCOPY  04/24/2019    Current Medications: Current Meds  Medication Sig  . acetaminophen (TYLENOL) 500 MG tablet Take 500 mg by mouth every 6 (six) hours as needed for headache.  [name] aspirin 81 MG tablet Take 81 mg by mouth at bedtime.  . Cyanocobalamin (VITAMIN B-12) 2500 MCG SUBL Place 5,000 mcg under the tongue daily.  [name] escitalopram (LEXAPRO) 10 MG tablet Take 1 tablet by mouth once daily  . fluticasone (FLONASE) 50 MCG/ACT nasal spray Use 1 spray(s) in each nostril once daily  . meloxicam (MOBIC) 7.5 MG tablet Take 2 tablets by mouth once daily  . Multiple Vitamin (MULTIVITAMIN WITH MINERALS) TABS tablet Take 1 tablet by mouth daily.  . nitroGLYCERIN (NITROSTAT) 0.4 MG SL tablet Place 1 tablet (0.4 mg total) under the tongue every 5 (five) minutes as needed for chest pain.  . pantoprazole (PROTONIX) 40 MG tablet Take 1 tablet by mouth twice daily  . traMADol (ULTRAM) 50 MG tablet TAKE 1 TO 2 TABLETS BY MOUTH THREE  TIMES DAILY AS NEEDED  .  triamcinolone ointment (KENALOG) 0.5 % APPLY OINTMENT TOPICALLY TWICE DAILY  . valACYclovir (VALTREX) 500 MG tablet Take 1 tablet by mouth once daily     Allergies:   Hytrin [terazosin], Levaquin [levofloxacin in d5w], Lipitor [atorvastatin], Penicillins, Reglan [metoclopramide], and Xanax [alprazolam]   Social History   Socioeconomic History  . Marital status: Married    Spouse name: Not on file  . Number of children: 2  . Years of education: Not on file  . Highest education level: Not on file  Occupational History  . Occupation: retired  Tobacco Use  . Smoking status: Former Smoker    Types: Cigars    Quit date: 08/30/2007    Years since quitting: 13.3  . Smokeless tobacco: Never Used  . Tobacco comment: occ. cigar  Vaping Use  . Vaping Use: Never used  Substance and Sexual Activity  . Alcohol use: Yes    Comment: occ.   . Drug use: Yes    Types: Marijuana    Comment: last marijuana this am, 2 "hits" per patient  . Sexual activity: Yes    Partners: Female    Birth control/protection: None  Other Topics Concern  . Not on file  Social History Narrative  . Not on file   Social Determinants of Health   Financial Resource Strain: Low Risk   . Difficulty of Paying Living Expenses: Not hard at all  Food Insecurity: No Food Insecurity  . Worried About Charity fundraiser in the Last Year: Never true  . Ran Out of Food in the Last Year: Never true  Transportation Needs: No Transportation Needs  . Lack of Transportation (Medical): No  . Lack of Transportation (Non-Medical): No  Physical Activity: Sufficiently Active  . Days of Exercise per Week: 5 days  . Minutes of Exercise per Session: 30 min  Stress: No Stress Concern Present  . Feeling of Stress : Not at all  Social Connections: Unknown  . Frequency of Communication with Friends and Family: More than three times a week  . Frequency of Social Gatherings with Friends and Family: More than three times a week  .  Attends Religious Services: Patient refused  . Active Member of Clubs or Organizations: Patient refused  . Attends Archivist Meetings: Patient refused  . Marital Status: Married     Family History: The patient's family history includes Colon cancer in his brother; Colon polyps in his brother; Coronary artery disease in an other family member; Diabetes (age of onset: 84) in his mother; Healthy in his daughter; Heart disease (age of onset: 54) in his father; Heart disease (age of onset: 4) in his brother; Lung cancer in his brother. There is no history of Stomach cancer, Rectal cancer, or Esophageal cancer.  ROS:   Please see the history of present illness.    All other systems reviewed and are negative.  EKGs/Labs/Other Studies Reviewed:    The following studies were reviewed today: I discussed my findings with the patient at length.  Lipids were reviewed.   Recent Labs: 07/28/2020: ALT 32; BUN 17; Creatinine, Ser 0.81; Hemoglobin 14.9; Platelets 221.0; Potassium 4.0; Sodium 138; TSH 3.51  Recent Lipid Panel    Component Value Date/Time   CHOL 175 07/28/2020 0840   CHOL 186 10/07/2019 0910   TRIG 129.0 07/28/2020 0840   HDL 38.20 (L) 07/28/2020 0840   HDL 30 (L) 10/07/2019 0910   CHOLHDL 5 07/28/2020 0840   VLDL  25.8 07/28/2020 0840   LDLCALC 111 (H) 07/28/2020 0840   LDLCALC 94 10/07/2019 0910   LDLDIRECT 110 (H) 10/07/2019 0910   LDLDIRECT 151.0 04/25/2018 0942    Physical Exam:    VS:  BP 132/82   Pulse 88   Ht 5\' 11"  (1.803 m)   Wt 248 lb 1.3 oz (112.5 kg)   SpO2 97%   BMI 34.60 kg/m     Wt Readings from Last 3 Encounters:  01/07/21 248 lb 1.3 oz (112.5 kg)  12/16/20 241 lb (109.3 kg)  12/07/20 241 lb 1.3 oz (109.4 kg)     GEN: Patient is in no acute distress HEENT: Normal NECK: No JVD; No carotid bruits LYMPHATICS: No lymphadenopathy CARDIAC: Hear sounds regular, 2/6 systolic murmur at the apex. RESPIRATORY:  Clear to auscultation without  rales, wheezing or rhonchi  ABDOMEN: Soft, non-tender, non-distended MUSCULOSKELETAL:  No edema; No deformity  SKIN: Warm and dry NEUROLOGIC:  Alert and oriented x 3 PSYCHIATRIC:  Normal affect   Signed, Jenean Lindau, MD  01/07/2021 8:36 AM    [place]

## 2021-01-07 NOTE — Patient Instructions (Signed)
Medication Instructions:  Your physician recommends that you continue on your current medications as directed. Please refer to the Current Medication list given to you today.  *If you need a refill on your cardiac medications before your next appointment, please call your pharmacy*   Lab Work: Your physician recommends that you return for lab work today: bmp, cbc, tsh, lft, lipids, hemoglobin a 1 c  If you have labs (blood work) drawn today and your tests are completely normal, you will receive your results only by: [name] MyChart Message (if you have MyChart) OR . A paper copy in the mail If you have any lab test that is abnormal or we need to change your treatment, we will call you to review the results.   Testing/Procedures: None   Follow-Up: At [facility], you and your health needs are our priority.  As part of our continuing mission to provide you with exceptional heart care, we have created designated Provider Care Teams.  These Care Teams include your primary Cardiologist (physician) and Advanced Practice Providers (APPs -  Physician Assistants and Nurse Practitioners) who all work together to provide you with the care you need, when you need it.  We recommend signing up for the patient portal called "MyChart".  Sign up information is provided on this After Visit Summary.  MyChart is used to connect with patients for Virtual Visits (Telemedicine).  Patients are able to view lab/test results, encounter notes, upcoming appointments, etc.  Non-urgent messages can be sent to your provider as well.   To learn more about what you can do with MyChart, go to [email or web address].    Your next appointment:   6 month(s)  The format for your next appointment:   In Person  Provider:   Jyl Heinz, MD   Other Instructions

## 2021-01-08 DIAGNOSIS — E782 Mixed hyperlipidemia: Secondary | ICD-10-CM | POA: Diagnosis not present

## 2021-01-08 DIAGNOSIS — I2510 Atherosclerotic heart disease of native coronary artery without angina pectoris: Secondary | ICD-10-CM | POA: Diagnosis not present

## 2021-01-09 LAB — CBC
Hematocrit: 43 % (ref 37.5–51.0)
Hemoglobin: 14.5 g/dL (ref 13.0–17.7)
MCH: 31 pg (ref 26.6–33.0)
MCHC: 33.7 g/dL (ref 31.5–35.7)
MCV: 92 fL (ref 79–97)
Platelets: 222 10*3/uL (ref 150–450)
RBC: 4.67 x10E6/uL (ref 4.14–5.80)
RDW: 13.1 % (ref 11.6–15.4)
WBC: 6 10*3/uL (ref 3.4–10.8)

## 2021-01-09 LAB — HEPATIC FUNCTION PANEL
ALT: 37 IU/L (ref 0–44)
AST: 26 IU/L (ref 0–40)
Albumin: 4.4 g/dL (ref 3.8–4.8)
Alkaline Phosphatase: 65 IU/L (ref 44–121)
Bilirubin Total: 0.6 mg/dL (ref 0.0–1.2)
Bilirubin, Direct: 0.14 mg/dL (ref 0.00–0.40)
Total Protein: 7 g/dL (ref 6.0–8.5)

## 2021-01-09 LAB — LIPID PANEL
Chol/HDL Ratio: 5.2 ratio — ABNORMAL HIGH (ref 0.0–5.0)
Cholesterol, Total: 217 mg/dL — ABNORMAL HIGH (ref 100–199)
HDL: 42 mg/dL (ref >39)
LDL Chol Calc (NIH): 141 mg/dL — ABNORMAL HIGH (ref 0–99)
Triglycerides: 191 mg/dL — ABNORMAL HIGH (ref 0–149)
VLDL Cholesterol Cal: 34 mg/dL (ref 5–40)

## 2021-01-09 LAB — BASIC METABOLIC PANEL
BUN/Creatinine Ratio: 21 (ref 10–24)
BUN: 16 mg/dL (ref 8–27)
CO2: 20 mmol/L (ref 20–29)
Calcium: 9.5 mg/dL (ref 8.6–10.2)
Chloride: 102 mmol/L (ref 96–106)
Creatinine, Ser: 0.77 mg/dL (ref 0.76–1.27)
GFR calc Af Amer: 109 mL/min/{1.73_m2} (ref >59)
GFR calc non Af Amer: 94 mL/min/{1.73_m2} (ref >59)
Glucose: 114 mg/dL — ABNORMAL HIGH (ref 65–99)
Potassium: 4.1 mmol/L (ref 3.5–5.2)
Sodium: 139 mmol/L (ref 134–144)

## 2021-01-09 LAB — TSH: TSH: 5.98 u[IU]/mL — ABNORMAL HIGH (ref 0.450–4.500)

## 2021-01-09 LAB — HEMOGLOBIN A1C
Est. average glucose Bld gHb Est-mCnc: 117 mg/dL
Hgb A1c MFr Bld: 5.7 % — ABNORMAL HIGH (ref 4.8–5.6)

## 2021-01-13 ENCOUNTER — Telehealth: Payer: Self-pay

## 2021-01-13 MED ORDER — EZETIMIBE 10 MG PO TABS: 5.0000 mg | ORAL_TABLET | Freq: Every day | ORAL | 3 refills | Status: DC

## 2021-01-13 MED ORDER — NEXLETOL 180 MG PO TABS: 1.0000 | ORAL_TABLET | Freq: Every day | ORAL | 6 refills | Status: DC

## 2021-01-13 MED ORDER — FISH OIL 1000 MG PO CAPS: 2.0000 | ORAL_CAPSULE | Freq: Two times a day (BID) | ORAL | 12 refills | Status: DC

## 2021-01-13 NOTE — Telephone Encounter (Signed)
PA approved for Nexletol 180 mg until 11-13-21

## 2021-01-13 NOTE — Telephone Encounter (Signed)
PA started on CMM for Nexletol 180 mg. Key  [id number]

## 2021-01-13 NOTE — Addendum Note (Signed)
Addended by: Truddie Hidden on: 01/13/2021 09:30 AM   Modules accepted: Orders

## 2021-02-08 ENCOUNTER — Other Ambulatory Visit: Payer: Self-pay

## 2021-02-09 ENCOUNTER — Encounter: Payer: Self-pay | Admitting: Internal Medicine

## 2021-02-09 ENCOUNTER — Ambulatory Visit (INDEPENDENT_AMBULATORY_CARE_PROVIDER_SITE_OTHER): Payer: Medicare HMO | Admitting: Internal Medicine

## 2021-02-09 DIAGNOSIS — E782 Mixed hyperlipidemia: Secondary | ICD-10-CM | POA: Diagnosis not present

## 2021-02-09 DIAGNOSIS — F419 Anxiety disorder, unspecified: Secondary | ICD-10-CM

## 2021-02-09 DIAGNOSIS — I2510 Atherosclerotic heart disease of native coronary artery without angina pectoris: Secondary | ICD-10-CM

## 2021-02-09 DIAGNOSIS — J320 Chronic maxillary sinusitis: Secondary | ICD-10-CM | POA: Diagnosis not present

## 2021-02-09 DIAGNOSIS — M5440 Lumbago with sciatica, unspecified side: Secondary | ICD-10-CM | POA: Diagnosis not present

## 2021-02-09 DIAGNOSIS — E785 Hyperlipidemia, unspecified: Secondary | ICD-10-CM | POA: Diagnosis not present

## 2021-02-09 DIAGNOSIS — K22719 Barrett's esophagus with dysplasia, unspecified: Secondary | ICD-10-CM | POA: Diagnosis not present

## 2021-02-09 MED ORDER — ESCITALOPRAM OXALATE 10 MG PO TABS
10.0000 mg | ORAL_TABLET | Freq: Every day | ORAL | 3 refills | Status: DC
Start: 2021-02-09 — End: 2022-03-10

## 2021-02-09 MED ORDER — TRAMADOL HCL 50 MG PO TABS: ORAL_TABLET | ORAL | 1 refills | Status: DC

## 2021-02-09 NOTE — Assessment & Plan Note (Addendum)
Cont w/Zetia, Omega 3, Nexletol - c/o side effects  Vascepa d/c - it caused fluttering, CP He will discuss Repatha option w/Dr Revankar

## 2021-02-09 NOTE — Assessment & Plan Note (Signed)
Tramadol  Potential benefits of a long term opioids use as well as potential risks (i.e. addiction risk, apnea etc) and complications (i.e. Somnolence, constipation and others) were explained to the patient and were aknowledged.   

## 2021-02-09 NOTE — Assessment & Plan Note (Signed)
Cont w/Zetia, Omega 3, Nexletol - c/o side effects  Vascepa d/c - it caused fluttering, CP He will discuss Repatha option w/Dr Revankar

## 2021-02-09 NOTE — Progress Notes (Signed)
Subjective:  Patient ID: [name], male    DOB: Mar 19, 1951  Age: 70 y.o. MRN: [id number]  CC: No chief complaint on file.   HPI [name] presents for OA, anxiety, dyslipidemia and CAD  Nexletol - c/o side effects  Outpatient Medications Prior to Visit  Medication Sig Dispense Refill  . acetaminophen (TYLENOL) 500 MG tablet Take 500 mg by mouth every 6 (six) hours as needed for headache.    [name] aspirin 81 MG tablet Take 81 mg by mouth at bedtime.    . Bempedoic Acid (NEXLETOL) 180 MG TABS Take 1 tablet by mouth daily in the afternoon. 30 tablet 6  . Cyanocobalamin (VITAMIN B-12) 2500 MCG SUBL Place 5,000 mcg under the tongue daily.    [name] escitalopram (LEXAPRO) 10 MG tablet Take 1 tablet by mouth once daily 90 tablet 2  . ezetimibe (ZETIA) 10 MG tablet Take 0.5 tablets (5 mg total) by mouth daily. 45 tablet 3  . fluticasone (FLONASE) 50 MCG/ACT nasal spray Use 1 spray(s) in each nostril once daily 48 g 3  . meloxicam (MOBIC) 7.5 MG tablet Take 2 tablets by mouth once daily 180 tablet 1  . Multiple Vitamin (MULTIVITAMIN WITH MINERALS) TABS tablet Take 1 tablet by mouth daily.    . nitroGLYCERIN (NITROSTAT) 0.4 MG SL tablet Place 1 tablet (0.4 mg total) under the tongue every 5 (five) minutes as needed for chest pain. 11 tablet 6  . Omega-3 Fatty Acids (FISH OIL) 1000 MG CAPS Take 2 capsules (2,000 mg total) by mouth 2 (two) times daily. 180 capsule 12  . pantoprazole (PROTONIX) 40 MG tablet Take 1 tablet by mouth twice daily 180 tablet 0  . traMADol (ULTRAM) 50 MG tablet TAKE 1 TO 2 TABLETS BY MOUTH THREE TIMES DAILY AS NEEDED 180 tablet 1  . triamcinolone ointment (KENALOG) 0.5 % APPLY OINTMENT TOPICALLY TWICE DAILY 45 g 0  . valACYclovir (VALTREX) 500 MG tablet Take 1 tablet by mouth once daily 90 tablet 2   No facility-administered medications prior to visit.    ROS: Review of Systems  Constitutional: Positive for fatigue. Negative for appetite change and unexpected  weight change.  HENT: Positive for rhinorrhea, sinus pressure, sinus pain and tinnitus. Negative for congestion, nosebleeds, sneezing, sore throat and trouble swallowing.   Eyes: Negative for itching and visual disturbance.  Respiratory: Negative for cough.   Cardiovascular: Negative for chest pain, palpitations and leg swelling.  Gastrointestinal: Negative for abdominal distention, blood in stool, diarrhea and nausea.  Genitourinary: Negative for frequency and hematuria.  Musculoskeletal: Positive for arthralgias, back pain and gait problem. Negative for joint swelling and neck pain.  Skin: Positive for rash.  Neurological: Negative for dizziness, tremors, speech difficulty and weakness.  Psychiatric/Behavioral: Positive for dysphoric mood. Negative for agitation, sleep disturbance and suicidal ideas. The patient is nervous/anxious.     Objective:  There were no vitals taken for this visit.  BP Readings from Last 3 Encounters:  01/07/21 132/82  12/07/20 134/82  11/10/20 140/88    Wt Readings from Last 3 Encounters:  01/07/21 248 lb 1.3 oz (112.5 kg)  12/16/20 241 lb (109.3 kg)  12/07/20 241 lb 1.3 oz (109.4 kg)    Physical Exam Constitutional:      General: He is not in acute distress.    Appearance: He is well-developed. He is obese.     Comments: NAD  Eyes:     Conjunctiva/sclera: Conjunctivae normal.     Pupils: Pupils are  equal, round, and reactive to light.  Neck:     Thyroid: No thyromegaly.     Vascular: No JVD.  Cardiovascular:     Rate and Rhythm: Normal rate and regular rhythm.     Heart sounds: Normal heart sounds. No murmur heard. No friction rub. No gallop.   Pulmonary:     Effort: Pulmonary effort is normal. No respiratory distress.     Breath sounds: Normal breath sounds. No wheezing or rales.  Chest:     Chest wall: No tenderness.  Abdominal:     General: Bowel sounds are normal. There is no distension.     Palpations: Abdomen is soft. There is no  mass.     Tenderness: There is no abdominal tenderness. There is no guarding or rebound.  Musculoskeletal:        General: Tenderness present. Normal range of motion.     Cervical back: Normal range of motion.  Lymphadenopathy:     Cervical: No cervical adenopathy.  Skin:    General: Skin is warm and dry.     Findings: No rash.  Neurological:     Mental Status: He is alert and oriented to person, place, and time.     Cranial Nerves: No cranial nerve deficit.     Motor: No abnormal muscle tone.     Coordination: Coordination abnormal.     Gait: Gait abnormal.     Deep Tendon Reflexes: Reflexes are normal and symmetric.  Psychiatric:        Behavior: Behavior normal.        Thought Content: Thought content normal.        Judgment: Judgment normal.     Knees w/pain   Lab Results  Component Value Date   WBC 6.0 01/08/2021   HGB 14.5 01/08/2021   HCT 43.0 01/08/2021   PLT 222 01/08/2021   GLUCOSE 114 (H) 01/08/2021   CHOL 217 (H) 01/08/2021   TRIG 191 (H) 01/08/2021   HDL 42 01/08/2021   LDLDIRECT 110 (H) 10/07/2019   LDLCALC 141 (H) 01/08/2021   ALT 37 01/08/2021   AST 26 01/08/2021   NA 139 01/08/2021   K 4.1 01/08/2021   CL 102 01/08/2021   CREATININE 0.77 01/08/2021   BUN 16 01/08/2021   CO2 20 01/08/2021   TSH 5.980 (H) 01/08/2021   PSA 1.18 07/28/2020   INR 1.0 08/29/2017   HGBA1C 5.7 (H) 01/08/2021    No results found.  Assessment & Plan:    Walker Kehr, MD

## 2021-02-09 NOTE — Assessment & Plan Note (Signed)
On Protonix 

## 2021-02-09 NOTE — Assessment & Plan Note (Signed)
S/p ENT eval Better after he quit using Vicks

## 2021-02-09 NOTE — Assessment & Plan Note (Signed)
On Lexapro 

## 2021-03-01 ENCOUNTER — Other Ambulatory Visit: Payer: Self-pay | Admitting: Internal Medicine

## 2021-03-18 ENCOUNTER — Telehealth: Payer: Self-pay | Admitting: Cardiology

## 2021-03-18 NOTE — Telephone Encounter (Signed)
Spoke to Madison just now and let her know that the patient is statin intolerant and can not take a statin. He is on Zetia daily now. She verbalizes understanding and thanks me for the call back.

## 2021-03-18 NOTE — Telephone Encounter (Signed)
    Crystal with [facility], she said they sent a request for pt to start statin therapy. She said they faxed it last 03/01/21 

## 2021-04-14 ENCOUNTER — Other Ambulatory Visit: Payer: Self-pay | Admitting: Internal Medicine

## 2021-05-11 ENCOUNTER — Other Ambulatory Visit: Payer: Self-pay

## 2021-05-11 ENCOUNTER — Ambulatory Visit (INDEPENDENT_AMBULATORY_CARE_PROVIDER_SITE_OTHER): Payer: Medicare HMO | Admitting: Internal Medicine

## 2021-05-11 ENCOUNTER — Encounter: Payer: Self-pay | Admitting: Internal Medicine

## 2021-05-11 DIAGNOSIS — E785 Hyperlipidemia, unspecified: Secondary | ICD-10-CM

## 2021-05-11 DIAGNOSIS — F419 Anxiety disorder, unspecified: Secondary | ICD-10-CM

## 2021-05-11 DIAGNOSIS — M5440 Lumbago with sciatica, unspecified side: Secondary | ICD-10-CM | POA: Diagnosis not present

## 2021-05-11 DIAGNOSIS — I2510 Atherosclerotic heart disease of native coronary artery without angina pectoris: Secondary | ICD-10-CM

## 2021-05-11 NOTE — Progress Notes (Signed)
Subjective:  Patient ID: [name], male    DOB: 17-Dec-1950  Age: 70 y.o. MRN: [id number]  CC: Follow-up (3 month f/u)   HPI [name] presents for chronic pain, anxiety, depression, B12 def  Outpatient Medications Prior to Visit  Medication Sig Dispense Refill   acetaminophen (TYLENOL) 500 MG tablet Take 500 mg by mouth every 6 (six) hours as needed for headache.     ALPRAZolam (XANAX) 0.5 MG tablet Take 0.5 mg by mouth 2 (two) times daily as needed for anxiety.     aspirin 81 MG tablet Take 81 mg by mouth at bedtime.     Bempedoic Acid (NEXLETOL) 180 MG TABS Take 1 tablet by mouth daily in the afternoon. 30 tablet 6   Cyanocobalamin (VITAMIN B-12) 2500 MCG SUBL Place 5,000 mcg under the tongue daily.     escitalopram (LEXAPRO) 10 MG tablet Take 1 tablet (10 mg total) by mouth daily. 90 tablet 3   fluticasone (FLONASE) 50 MCG/ACT nasal spray Use 1 spray(s) in each nostril once daily 16 g 0   loratadine (CLARITIN) 10 MG tablet Take 10 mg by mouth daily.     meloxicam (MOBIC) 7.5 MG tablet Take 2 tablets by mouth once daily 180 tablet 0   Multiple Vitamin (MULTIVITAMIN WITH MINERALS) TABS tablet Take 1 tablet by mouth daily.     Omega-3 Fatty Acids (FISH OIL) 1000 MG CAPS Take 2 capsules (2,000 mg total) by mouth 2 (two) times daily. 180 capsule 12   OVER THE COUNTER MEDICATION Flexnow take 3 capsule every morning     pantoprazole (PROTONIX) 40 MG tablet Take 1 tablet by mouth twice daily 180 tablet 3   traMADol (ULTRAM) 50 MG tablet TAKE 1 TO 2 TABLETS BY MOUTH THREE TIMES DAILY AS NEEDED 180 tablet 0   valACYclovir (VALTREX) 500 MG tablet Take 1 tablet by mouth once daily 90 tablet 2   ezetimibe (ZETIA) 10 MG tablet Take 0.5 tablets (5 mg total) by mouth daily. 45 tablet 3   nitroGLYCERIN (NITROSTAT) 0.4 MG SL tablet Place 1 tablet (0.4 mg total) under the tongue every 5 (five) minutes as needed for chest pain. 11 tablet 6   Multiple Vitamin (MULTIVITAMIN) tablet Take 1  tablet by mouth daily.     triamcinolone ointment (KENALOG) 0.5 % APPLY OINTMENT TOPICALLY TWICE DAILY 45 g 0   No facility-administered medications prior to visit.    ROS: Review of Systems  Constitutional:  Positive for fatigue. Negative for appetite change and unexpected weight change.  HENT:  Negative for congestion, nosebleeds, sneezing, sore throat and trouble swallowing.   Eyes:  Negative for itching and visual disturbance.  Respiratory:  Negative for cough.   Cardiovascular:  Negative for chest pain, palpitations and leg swelling.  Gastrointestinal:  Negative for abdominal distention, blood in stool, diarrhea and nausea.  Genitourinary:  Negative for frequency and hematuria.  Musculoskeletal:  Positive for arthralgias, gait problem and myalgias. Negative for back pain, joint swelling and neck pain.  Skin:  Negative for rash.  Neurological:  Negative for dizziness, tremors, speech difficulty and weakness.  Psychiatric/Behavioral:  Negative for agitation, dysphoric mood, sleep disturbance and suicidal ideas. The patient is nervous/anxious.    Objective:  BP 128/70 (BP Location: Left Arm)   Pulse 73   Temp 98.3 F (36.8 C) (Oral)   Ht 5\' 11"  (1.803 m)   Wt 247 lb (112 kg)   SpO2 96%   BMI 34.45 kg/m   BP  Readings from Last 3 Encounters:  05/11/21 128/70  02/09/21 140/82  01/07/21 132/82    Wt Readings from Last 3 Encounters:  05/11/21 247 lb (112 kg)  02/09/21 245 lb (111.1 kg)  01/07/21 248 lb 1.3 oz (112.5 kg)    Physical Exam Constitutional:      General: He is not in acute distress.    Appearance: He is well-developed. He is obese.     Comments: NAD  Eyes:     Conjunctiva/sclera: Conjunctivae normal.     Pupils: Pupils are equal, round, and reactive to light.  Neck:     Thyroid: No thyromegaly.     Vascular: No JVD.  Cardiovascular:     Rate and Rhythm: Normal rate and regular rhythm.     Heart sounds: Normal heart sounds. No murmur heard.   No  friction rub. No gallop.  Pulmonary:     Effort: Pulmonary effort is normal. No respiratory distress.     Breath sounds: Normal breath sounds. No wheezing or rales.  Chest:     Chest wall: No tenderness.  Abdominal:     General: Bowel sounds are normal. There is no distension.     Palpations: Abdomen is soft. There is no mass.     Tenderness: There is no abdominal tenderness. There is no guarding or rebound.  Musculoskeletal:        General: Tenderness present. Normal range of motion.     Cervical back: Normal range of motion. Tenderness present.  Lymphadenopathy:     Cervical: No cervical adenopathy.  Skin:    General: Skin is warm and dry.     Findings: No rash.  Neurological:     Mental Status: He is alert and oriented to person, place, and time.     Cranial Nerves: No cranial nerve deficit.     Motor: No abnormal muscle tone.     Coordination: Coordination normal.     Gait: Gait abnormal.     Deep Tendon Reflexes: Reflexes are normal and symmetric.  Psychiatric:        Behavior: Behavior normal.        Thought Content: Thought content normal.        Judgment: Judgment normal.    Lab Results  Component Value Date   WBC 6.0 01/08/2021   HGB 14.5 01/08/2021   HCT 43.0 01/08/2021   PLT 222 01/08/2021   GLUCOSE 114 (H) 01/08/2021   CHOL 217 (H) 01/08/2021   TRIG 191 (H) 01/08/2021   HDL 42 01/08/2021   LDLDIRECT 110 (H) 10/07/2019   LDLCALC 141 (H) 01/08/2021   ALT 37 01/08/2021   AST 26 01/08/2021   NA 139 01/08/2021   K 4.1 01/08/2021   CL 102 01/08/2021   CREATININE 0.77 01/08/2021   BUN 16 01/08/2021   CO2 20 01/08/2021   TSH 5.980 (H) 01/08/2021   PSA 1.18 07/28/2020   INR 1.0 08/29/2017   HGBA1C 5.7 (H) 01/08/2021    No results found.  Assessment & Plan:    Walker Kehr, MD

## 2021-05-11 NOTE — Assessment & Plan Note (Addendum)
On Nexletol, Zetia, Fish oil for lipids. F/u w/dr Revankar. Labs this week.

## 2021-05-11 NOTE — Assessment & Plan Note (Signed)
Xanax prn  Potential benefits of a long term benzodiazepines  use as well as potential risks  and complications were explained to the patient and were aknowledged. 

## 2021-05-11 NOTE — Assessment & Plan Note (Signed)
Tramadol  Potential benefits of a long term opioids use as well as potential risks (i.e. addiction risk, apnea etc) and complications (i.e. Somnolence, constipation and others) were explained to the patient and were aknowledged.   

## 2021-05-11 NOTE — Assessment & Plan Note (Signed)
On Nexletol, Zetia, Fish oil for lipids. F/u w/dr Revankar. Labs this week.

## 2021-06-03 ENCOUNTER — Other Ambulatory Visit: Payer: Self-pay | Admitting: Internal Medicine

## 2021-07-06 ENCOUNTER — Other Ambulatory Visit: Payer: Self-pay | Admitting: Internal Medicine

## 2021-07-13 DIAGNOSIS — E782 Mixed hyperlipidemia: Secondary | ICD-10-CM | POA: Diagnosis not present

## 2021-07-13 DIAGNOSIS — I2510 Atherosclerotic heart disease of native coronary artery without angina pectoris: Secondary | ICD-10-CM | POA: Diagnosis not present

## 2021-07-14 ENCOUNTER — Other Ambulatory Visit: Payer: Self-pay

## 2021-07-14 DIAGNOSIS — E782 Mixed hyperlipidemia: Secondary | ICD-10-CM

## 2021-07-14 LAB — HEPATIC FUNCTION PANEL
ALT: 35 [IU]/L (ref 0–44)
AST: 30 [IU]/L (ref 0–40)
Albumin: 4.7 g/dL (ref 3.8–4.8)
Alkaline Phosphatase: 60 [IU]/L (ref 44–121)
Bilirubin Total: 0.7 mg/dL (ref 0.0–1.2)
Bilirubin, Direct: 0.16 mg/dL (ref 0.00–0.40)
Total Protein: 7.5 g/dL (ref 6.0–8.5)

## 2021-07-14 LAB — LIPID PANEL
Chol/HDL Ratio: 5.2 ratio — ABNORMAL HIGH (ref 0.0–5.0)
Cholesterol, Total: 181 mg/dL (ref 100–199)
HDL: 35 mg/dL — ABNORMAL LOW (ref >39)
LDL Chol Calc (NIH): 98 mg/dL (ref 0–99)
Triglycerides: 280 mg/dL — ABNORMAL HIGH (ref 0–149)
VLDL Cholesterol Cal: 48 mg/dL — ABNORMAL HIGH (ref 5–40)

## 2021-07-14 MED ORDER — ICOSAPENT ETHYL 1 G PO CAPS: 2.0000 g | ORAL_CAPSULE | Freq: Two times a day (BID) | ORAL | 2 refills | Status: DC

## 2021-07-15 ENCOUNTER — Other Ambulatory Visit: Payer: Self-pay | Admitting: Internal Medicine

## 2021-07-16 ENCOUNTER — Other Ambulatory Visit: Payer: Self-pay

## 2021-07-16 ENCOUNTER — Telehealth: Payer: Self-pay | Admitting: Cardiology

## 2021-07-16 DIAGNOSIS — I2510 Atherosclerotic heart disease of native coronary artery without angina pectoris: Secondary | ICD-10-CM

## 2021-07-16 NOTE — Telephone Encounter (Signed)
Pt is returning call from Wednesday. Please advise pt further

## 2021-07-27 ENCOUNTER — Other Ambulatory Visit: Payer: Self-pay

## 2021-07-29 ENCOUNTER — Ambulatory Visit: Payer: Medicare HMO | Admitting: Cardiology

## 2021-07-29 ENCOUNTER — Encounter: Payer: Self-pay | Admitting: Cardiology

## 2021-07-29 ENCOUNTER — Other Ambulatory Visit: Payer: Self-pay

## 2021-07-29 VITALS — BP 132/76 | HR 76 | Ht 72.0 in | Wt 235.1 lb

## 2021-07-29 DIAGNOSIS — E785 Hyperlipidemia, unspecified: Secondary | ICD-10-CM | POA: Diagnosis not present

## 2021-07-29 DIAGNOSIS — I10 Essential (primary) hypertension: Secondary | ICD-10-CM

## 2021-07-29 DIAGNOSIS — I2510 Atherosclerotic heart disease of native coronary artery without angina pectoris: Secondary | ICD-10-CM | POA: Diagnosis not present

## 2021-07-29 DIAGNOSIS — E782 Mixed hyperlipidemia: Secondary | ICD-10-CM | POA: Diagnosis not present

## 2021-07-29 HISTORY — DX: Essential (primary) hypertension: I10

## 2021-07-29 MED ORDER — NITROGLYCERIN 0.4 MG SL SUBL: 0.4000 mg | SUBLINGUAL_TABLET | SUBLINGUAL | 6 refills | Status: DC | PRN

## 2021-07-29 MED ORDER — NEXLETOL 180 MG PO TABS: 1.0000 | ORAL_TABLET | Freq: Every day | ORAL | 12 refills | Status: DC

## 2021-07-29 MED ORDER — ICOSAPENT ETHYL 1 G PO CAPS: 2.0000 g | ORAL_CAPSULE | Freq: Two times a day (BID) | ORAL | 2 refills | Status: DC

## 2021-07-29 MED ORDER — EZETIMIBE 10 MG PO TABS: 5.0000 mg | ORAL_TABLET | Freq: Every day | ORAL | 3 refills | Status: DC

## 2021-07-29 NOTE — Patient Instructions (Signed)
Medication Instructions:  No medication changes. *If you need a refill on your cardiac medications before your next appointment, please call your pharmacy*   Lab Work: Your physician recommends that you return for lab work in: 4 months (11/2021). You need to have labs done when you are fasting.  You can come Monday through Friday 8:30 am to 12:00 pm and 1:15 to 4:30. You do not need to make an appointment as the order has already been placed. The labs you are going to have done are BMET, CBC, TSH, LFT and Lipids.  If you have labs (blood work) drawn today and your tests are completely normal, you will receive your results only by: [place] (if you have MyChart) OR A paper copy in the mail If you have any lab test that is abnormal or we need to change your treatment, we will call you to review the results.   Testing/Procedures: None ordered   Follow-Up: At [facility], you and your health needs are our priority.  As part of our continuing mission to provide you with exceptional heart care, we have created designated Provider Care Teams.  These Care Teams include your primary Cardiologist (physician) and Advanced Practice Providers (APPs -  Physician Assistants and Nurse Practitioners) who all work together to provide you with the care you need, when you need it.  We recommend signing up for the patient portal called "MyChart".  Sign up information is provided on this After Visit Summary.  MyChart is used to connect with patients for Virtual Visits (Telemedicine).  Patients are able to view lab/test results, encounter notes, upcoming appointments, etc.  Non-urgent messages can be sent to your provider as well.   To learn more about what you can do with MyChart, go to [email or web address].    Your next appointment:   9 month(s)  The format for your next appointment:   In Person  Provider:   Jyl Heinz, MD   Other Instructions NA

## 2021-07-29 NOTE — Addendum Note (Signed)
Addended by: Truddie Hidden on: 07/29/2021 03:17 PM   Modules accepted: Orders

## 2021-07-29 NOTE — Progress Notes (Signed)
Cardiology Office Note:    Date:  07/29/2021   ID:  [name], DOB [birth year], MRN [id number]  PCP:  Cassandria Anger, MD  Cardiologist:  Jenean Lindau, MD   Referring MD: Cassandria Anger, MD    ASSESSMENT:    1. Coronary artery disease involving native coronary artery of native heart without angina pectoris   2. Dyslipidemia   3. Essential hypertension    PLAN:    In order of problems listed above:  Coronary artery disease: Mild nonobstructive in nature by coronary angiography in 2018.  Secondary prevention stressed with the patient.  Importance of compliance with diet medication stressed and vocalized understanding.  He was advised to exercise to the best of his ability. Essential hypertension: Blood pressure stable and diet was emphasized.  Lifestyle modification urged. Mixed dyslipidemia: Lipids reviewed.  Markedly elevated triglycerides.  Compliance with diet urged and he promises to do better.  He is lax with his diet and promises to do better.  He will get his blood work rechecked in 4 months and follow-up appointment in 9 months or earlier if he has any concerns. Obesity: Predominantly abdominal obesity: Risks emphasized and he understands and promises to do better.  Patient had multiple questions which were answered to his satisfaction.   Medication Adjustments/Labs and Tests Ordered: Current medicines are reviewed at length with the patient today.  Concerns regarding medicines are outlined above.  No orders of the defined types were placed in this encounter.  No orders of the defined types were placed in this encounter.    No chief complaint on file.    History of Present Illness:    [name] is a 70 y.o. male.  Patient has past medical history of nonobstructive coronary artery disease, essential hypertension, dyslipidemia and obesity.  He denies any problems at this time and takes care of activities of daily living.  No chest pain orthopnea or  PND.  He cannot exercise much because of orthopedic issues.  At the time of my evaluation, the patient is alert awake oriented and in no distress.  Past Medical History:  Diagnosis Date   Actinic keratosis    Acute sinusitis 06/21/2010   9/14, 10/19    Allergy    Anxiety 08/24/2017   Chronic  Worse in 10/18 Xanax prn - d/c  Potential benefits of a long term benzodiazepines  use as well as potential risks  and complications were explained to the patient and were aknowledged. 11/18 Lexapro     Arthralgia 04/14/2017   2018 2019 Rheum consult; Gluten free trial Dr Estanislado Pandy 12[facility] Depo-medrol IM Increase Tramadol w/caution  Potential benefits of a long term opioids use as well as potential risks (i.e. addiction risk, apnea etc) and complications (i.e. Somnolence, constipation and others) were explained to the patient and were aknowledged.   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 Pravastatin - pt stopped due to side  effects 1/21 Zetia - causing pain too Krill oil d/c.  Repatha option discussed.  9/21 Vascepa, Zetia   Chest pain 12/07/2020   Chondrocalcinosis 07/06/2018   Chronic sinusitis 08/12/2013    Sinus congestion, SOB - better after surgery - nov 2014 9/14 worse CT IMPRESSION:  Minimal sinus mucosal thickening, primarily in the frontal recesses.  Small right maxillary mucous retention cyst. No CT evidence of acute  sinusitis.  Electronically Signed  By: Lars Pinks M.D.  On: 08/15/2013  09:19  Doxy x 4 wks   Colonic polyp    COLONIC POLYPS, HX OF    CONTACT DERMATITIS DUE TO SOLVENTS    DDD (degenerative disc disease), cervical 07/06/2018   DDD (degenerative disc disease), lumbar 07/06/2018   and facet joint arthropathy   Dizzinesses 07/18/2017   9/18 I suggested a brain MRI to r/o acustic neuroma etc: he will think about it and check on cost ENT ref suggested   DOE (dyspnea on exertion) 07/25/2012   related to eating shrimp   Dyslipidemia 06/21/2013   Chronic  Elev TG, low HDL, nl LDL Pravastatin - pt stopped due to side effects 1/21 Repatha option discussed. On Zetia - c/o side effeects 9/21 Vascepa, Zetia   Fatigue 06/09/2015   2016 no OSA  2018 much better (60%) off Reglan.    Folliculitis [id number]   GERD 05/09/2008   Chronic  -- Protonix 5/18 We discussed his GERD/Barrett's treated at [facility] (Dr Ferdinand Lango). Reglan was d/c'd due to CP. He was dx'd w/pancreatitis and started on Zenpep He had an abd CT, [place], EGD and colonoscopy. He would like to have a 2nd opinion here - will refer   GERD (gastroesophageal reflux disease)    Hand eczema 07/07/2015   8/16 dyshidrotic eczema B    HAND PAIN    Heart murmur    HERPES, GENITAL NOS    Hyperglycemia 06/09/2015   Mild     Hypogonadism in male 04/25/2018   Declined testosterone Rx   Impacted cerumen of left ear 12/24/2020   Ingrowing toenail of left foot 07/25/2012   is resolved now 07-25-12   KNEE PAIN    Laryngopharyngeal reflux (LPR) 10/02/2018   Localized swelling, mass or lump of neck 09/05/2018   Low back pain 06/09/2015   Per Dr Alvan Dame 3/17 - planning to see Dr Annette Stable Body pains are much better (60%) off Reglan. Off Tramadol 2019 Rheum consult; Gluten free trial Tramadol  Potential benefits of a long term opioids use as well as potential risks (i.e. addiction risk, apnea etc) and complications (i.e. Somnolence, constipation and others) were explained to the patient and were aknowledged.   Nausea 07/18/2017    Chronic w/dizziness I suggested a brain MRI to r/o acustic neuroma etc: he will think about it and check on cost 9/18   Neoplasm of uncertain behavior of skin    OA (osteoarthritis) of knee    Obesity (BMI 30.0-34.9) 01/07/2021   ONYCHOMYCOSIS    OSA (obstructive sleep apnea) 08/21/2013   2014 no need for CPAP    Preop exam for internal medicine    Primary osteoarthritis of both hands 11/21/2008   Chronic and severe Tramadol  Potential benefits of a long term opioids use as well as potential risks (i.e. addiction risk, apnea etc) and complications (i.e. Somnolence, constipation and others) were explained to the patient and were aknowledged.   10/18 Body pains are much better (60%) off Reglan. 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 Xanax prn - d/c  Potential benefits of a long term benzodiazepines  use as well as potential risks  and complications were explained to the patient and were aknowledged. Neurol ref - Dr Tat:   "Hi   Upper respiratory infection 07/27/2018   9/19, 12/21   Well adult exam     Past Surgical History:  Procedure Laterality Date   COLONOSCOPY  04/24/2019   JOINT REPLACEMENT  9/13   L TKR   KNEE ARTHROSCOPY     Left   LEFT HEART CATH AND CORONARY ANGIOGRAPHY N/A 08/31/2017   Procedure: LEFT HEART CATH AND CORONARY ANGIOGRAPHY;  Surgeon: Nelva Bush, MD;  Location: [facility];  Service:  Cardiovascular;  Laterality: N/A;   TOTAL KNEE ARTHROPLASTY  07/31/2012   Procedure: TOTAL KNEE ARTHROPLASTY;  Surgeon: Sydnee Cabal, MD;  Location: [facility];  Service: Orthopedics;  Laterality: Left;   UMBILICAL HERNIA REPAIR  07-25-12   10 yrs ago   UPPER GASTROINTESTINAL ENDOSCOPY  04/24/2019    Current Medications: Current Meds  Medication Sig   acetaminophen (TYLENOL) 500 MG tablet Take 500 mg by mouth every 6 (six) hours as needed for headache.   ALPRAZolam (XANAX) 0.5 MG tablet Take 0.5 mg by mouth 2 (two) times daily as needed for anxiety.   aspirin 81 MG tablet Take 81 mg by mouth at bedtime.   Bempedoic Acid (NEXLETOL) 180 MG TABS Take 1 tablet by mouth daily in the afternoon.   Cyanocobalamin (VITAMIN B-12) 2500 MCG SUBL Place 5,000 mcg under the tongue daily.   escitalopram (LEXAPRO) 10 MG tablet Take 1 tablet (10 mg total) by mouth daily.   ezetimibe (ZETIA) 10 MG tablet Take 5 mg by mouth daily.   fluticasone (FLONASE) 50 MCG/ACT nasal spray Place 1 spray into both nostrils daily.   icosapent Ethyl (VASCEPA) 1 g capsule Take 2 capsules (2 g total) by mouth 2 (two) times daily.   loratadine (CLARITIN) 10 MG tablet Take 10 mg by mouth daily.   meloxicam (MOBIC) 7.5 MG tablet Take 2 tablets by mouth once daily   Multiple Vitamin (MULTIVITAMIN WITH MINERALS) TABS tablet Take 1 tablet by mouth daily.   nitroGLYCERIN (NITROSTAT) 0.4 MG SL tablet Place 0.4 mg under the tongue every 5 (five) minutes as needed for chest pain.   Omega-3 Fatty Acids (FISH OIL) 1000 MG CAPS Take 2 capsules (2,000 mg total) by mouth 2 (two) times daily.   OVER THE COUNTER MEDICATION Take 3 capsules by mouth every morning. Flexnow    pantoprazole (PROTONIX) 40 MG tablet Take 1 tablet by mouth twice daily   traMADol (ULTRAM) 50 MG tablet Take 50-100 mg by mouth 3 (three) times daily as needed for pain.   valACYclovir (VALTREX) 500 MG tablet Take 1 tablet by mouth once daily     Allergies:   Hytrin  [terazosin],  Levaquin [levofloxacin in d5w], Lipitor [atorvastatin], Penicillins, Reglan [metoclopramide], and Xanax [alprazolam]   Social History   Socioeconomic History   Marital status: Married    Spouse name: Not on file   Number of children: 2   Years of education: Not on file   Highest education level: Not on file  Occupational History   Occupation: retired  Tobacco Use   Smoking status: Former    Types: Cigars    Quit date: 08/30/2007    Years since quitting: 13.9   Smokeless tobacco: Never   Tobacco comments:    occ. cigar  Vaping Use   Vaping Use: Never used  Substance and Sexual Activity   Alcohol use: Yes    Comment: occ.    Drug use: Yes    Types: Marijuana    Comment: last marijuana this am, 2 "hits" per patient   Sexual activity: Yes    Partners: Female    Birth control/protection: None  Other Topics Concern   Not on file  Social History Narrative   Not on file   Social Determinants of Health   Financial Resource Strain: Low Risk    Difficulty of Paying Living Expenses: Not hard at all  Food Insecurity: No Food Insecurity   Worried About Charity fundraiser in the Last Year: Never true   [place] in the Last Year: Never true  Transportation Needs: No Transportation Needs   Lack of Transportation (Medical): No   Lack of Transportation (Non-Medical): No  Physical Activity: Sufficiently Active   Days of Exercise per Week: 5 days   Minutes of Exercise per Session: 30 min  Stress: No Stress Concern Present   Feeling of Stress : Not at all  Social Connections: Unknown   Frequency of Communication with Friends and Family: More than three times a week   Frequency of Social Gatherings with Friends and Family: More than three times a week   Attends Religious Services: Patient refused   Marine scientist or Organizations: Patient refused   Attends Music therapist: Patient refused   Marital Status: Married     Family  History: The patient's family history includes Colon cancer in his brother; Colon polyps in his brother; Coronary artery disease in an other family member; Diabetes (age of onset: 87) in his mother; Healthy in his daughter; Heart disease (age of onset: 5) in his father; Heart disease (age of onset: 9) in his brother; Lung cancer in his brother. There is no history of Stomach cancer, Rectal cancer, or Esophageal cancer.  ROS:   Please see the history of present illness.    All other systems reviewed and are negative.  EKGs/Labs/Other Studies Reviewed:    The following studies were reviewed today: I reviewed lab work with the patient in extensive length   Recent Labs: 01/08/2021: BUN 16; Creatinine, Ser 0.77; Hemoglobin 14.5; Platelets 222; Potassium 4.1; Sodium 139; TSH 5.980 07/13/2021: ALT 35  Recent Lipid Panel    Component Value Date/Time   CHOL 181 07/13/2021 0847   TRIG 280 (H) 07/13/2021 0847   HDL 35 (L) 07/13/2021 0847   CHOLHDL 5.2 (H) 07/13/2021 0847   CHOLHDL 5 07/28/2020 0840   VLDL 25.8 07/28/2020 0840   LDLCALC 98 07/13/2021 0847   LDLDIRECT 110 (H) 10/07/2019 0910   LDLDIRECT 151.0 04/25/2018 0942    Physical Exam:    VS:  BP 132/76   Pulse 76   Ht 6' (1.829 m)  Wt 235 lb 1.3 oz (106.6 kg)   SpO2 96%   BMI 31.88 kg/m     Wt Readings from Last 3 Encounters:  07/29/21 235 lb 1.3 oz (106.6 kg)  05/11/21 247 lb (112 kg)  02/09/21 245 lb (111.1 kg)     GEN: Patient is in no acute distress HEENT: Normal NECK: No JVD; No carotid bruits LYMPHATICS: No lymphadenopathy CARDIAC: Hear sounds regular, 2/6 systolic murmur at the apex. RESPIRATORY:  Clear to auscultation without rales, wheezing or rhonchi  ABDOMEN: Soft, non-tender, non-distended MUSCULOSKELETAL:  No edema; No deformity  SKIN: Warm and dry NEUROLOGIC:  Alert and oriented x 3 PSYCHIATRIC:  Normal affect   Signed, Jenean Lindau, MD  07/29/2021 8:19 AM    [place]

## 2021-08-05 ENCOUNTER — Telehealth: Payer: Self-pay | Admitting: Cardiology

## 2021-08-05 DIAGNOSIS — E782 Mixed hyperlipidemia: Secondary | ICD-10-CM

## 2021-08-05 NOTE — Telephone Encounter (Signed)
Pt states that since he has stopped the Vascepa all sx have resolved. Pt states this is the same side effects he had before when he tried the medication.  Pt also states that he cannot afford the Nexletol as it is $400.00. Should we refer him to the lipid clinic?How do you advise?

## 2021-08-05 NOTE — Telephone Encounter (Signed)
Message added to previous message regarding medications.

## 2021-08-05 NOTE — Telephone Encounter (Signed)
New Message:     Patient says his insurance will no longer pay for his Nexletol. He says he would like for Dr Geraldo Pitter to prescribe another medicine in the place of it, that will be the most helpful for him.

## 2021-08-05 NOTE — Telephone Encounter (Signed)
Pt c/o medication issue:  1. Name of Medication: Vascepa  2. How are you currently taking this medication (dosage and times per day)?  2 times a day  3. Are you having a reaction (difficulty breathing--STAT)? no  4. What is your medication issue?  Heart racing, no energy, feels terrible and spasm in his chest - stop taking it 2 days ago

## 2021-08-06 NOTE — Addendum Note (Signed)
Addended by: Truddie Hidden on: 08/06/2021 02:32 PM   Modules accepted: Orders

## 2021-08-06 NOTE — Telephone Encounter (Signed)
Recommendations reviewed with pt as per Dr. Revankar's note.  Pt verbalized understanding and had no additional questions.   

## 2021-08-10 ENCOUNTER — Ambulatory Visit: Payer: Medicare HMO | Admitting: Internal Medicine

## 2021-08-13 ENCOUNTER — Ambulatory Visit (INDEPENDENT_AMBULATORY_CARE_PROVIDER_SITE_OTHER): Payer: Medicare HMO | Admitting: Internal Medicine

## 2021-08-13 ENCOUNTER — Encounter: Payer: Self-pay | Admitting: Internal Medicine

## 2021-08-13 ENCOUNTER — Other Ambulatory Visit: Payer: Self-pay

## 2021-08-13 VITALS — BP 130/72 | HR 74 | Temp 98.7°F | Ht 72.0 in | Wt 236.2 lb

## 2021-08-13 DIAGNOSIS — M25561 Pain in right knee: Secondary | ICD-10-CM

## 2021-08-13 DIAGNOSIS — M5440 Lumbago with sciatica, unspecified side: Secondary | ICD-10-CM | POA: Diagnosis not present

## 2021-08-13 DIAGNOSIS — E785 Hyperlipidemia, unspecified: Secondary | ICD-10-CM | POA: Diagnosis not present

## 2021-08-13 DIAGNOSIS — I2510 Atherosclerotic heart disease of native coronary artery without angina pectoris: Secondary | ICD-10-CM

## 2021-08-13 DIAGNOSIS — Z23 Encounter for immunization: Secondary | ICD-10-CM | POA: Diagnosis not present

## 2021-08-13 DIAGNOSIS — M25562 Pain in left knee: Secondary | ICD-10-CM | POA: Diagnosis not present

## 2021-08-13 NOTE — Assessment & Plan Note (Signed)
On Nexletol, Zetia, Fish oil for lipids. F/u w/dr Revankar.

## 2021-08-13 NOTE — Progress Notes (Signed)
Subjective:  Patient ID: [name], male    DOB: 1951-08-20  Age: 70 y.o. MRN: [id number]  CC: Follow-up (3 MONTH F/U- FLU SHOT)   HPI CORVIN SORBO presents for knee OA, CAD, dyslipidemia f/u Loosing wt on diet  Outpatient Medications Prior to Visit  Medication Sig Dispense Refill   acetaminophen (TYLENOL) 500 MG tablet Take 500 mg by mouth every 6 (six) hours as needed for headache.     ALPRAZolam (XANAX) 0.5 MG tablet Take 0.5 mg by mouth 2 (two) times daily as needed for anxiety.     aspirin 81 MG tablet Take 81 mg by mouth at bedtime.     Cyanocobalamin (VITAMIN B-12) 2500 MCG SUBL Place 5,000 mcg under the tongue daily.     escitalopram (LEXAPRO) 10 MG tablet Take 1 tablet (10 mg total) by mouth daily. 90 tablet 3   ezetimibe (ZETIA) 10 MG tablet Take 0.5 tablets (5 mg total) by mouth daily. 45 tablet 3   fluticasone (FLONASE) 50 MCG/ACT nasal spray Place 1 spray into both nostrils daily.     loratadine (CLARITIN) 10 MG tablet Take 10 mg by mouth daily.     meloxicam (MOBIC) 7.5 MG tablet Take 2 tablets by mouth once daily 180 tablet 0   Multiple Vitamin (MULTIVITAMIN WITH MINERALS) TABS tablet Take 1 tablet by mouth daily.     nitroGLYCERIN (NITROSTAT) 0.4 MG SL tablet Place 1 tablet (0.4 mg total) under the tongue every 5 (five) minutes as needed for chest pain. 25 tablet 6   OVER THE COUNTER MEDICATION Take 3 capsules by mouth every morning. Flexnow      pantoprazole (PROTONIX) 40 MG tablet Take 1 tablet by mouth twice daily 180 tablet 3   traMADol (ULTRAM) 50 MG tablet Take 50-100 mg by mouth 3 (three) times daily as needed for pain.     valACYclovir (VALTREX) 500 MG tablet Take 1 tablet by mouth once daily 90 tablet 1   Omega-3 Fatty Acids (FISH OIL) 1000 MG CAPS Take 2 capsules (2,000 mg total) by mouth 2 (two) times daily. 180 capsule 12   Bempedoic Acid (NEXLETOL) 180 MG TABS Take 1 tablet by mouth daily in the afternoon. (Patient not taking: Reported on 08/13/2021)  30 tablet 12   icosapent Ethyl (VASCEPA) 1 g capsule Take 2 capsules (2 g total) by mouth 2 (two) times daily. (Patient not taking: Reported on 08/13/2021) 120 capsule 2   No facility-administered medications prior to visit.    ROS: Review of Systems  Constitutional:  Negative for appetite change, fatigue and unexpected weight change.  HENT:  Negative for congestion, nosebleeds, sneezing, sore throat and trouble swallowing.   Eyes:  Negative for itching and visual disturbance.  Respiratory:  Negative for cough.   Cardiovascular:  Negative for chest pain, palpitations and leg swelling.  Gastrointestinal:  Negative for abdominal distention, blood in stool, diarrhea and nausea.  Genitourinary:  Negative for frequency and hematuria.  Musculoskeletal:  Positive for arthralgias, back pain and gait problem. Negative for joint swelling and neck pain.  Skin:  Negative for rash.  Neurological:  Negative for dizziness, tremors, speech difficulty and weakness.  Psychiatric/Behavioral:  Negative for agitation, dysphoric mood and sleep disturbance. The patient is not nervous/anxious.    Objective:  BP 130/72 (BP Location: Left Arm)   Pulse 74   Temp 98.7 F (37.1 C) (Oral)   Ht 6' (1.829 m)   Wt 236 lb 3.2 oz (107.1 kg)   SpO2  98%   BMI 32.03 kg/m   BP Readings from Last 3 Encounters:  08/13/21 130/72  07/29/21 132/76  05/11/21 128/70    Wt Readings from Last 3 Encounters:  08/13/21 236 lb 3.2 oz (107.1 kg)  07/29/21 235 lb 1.3 oz (106.6 kg)  05/11/21 247 lb (112 kg)    Physical Exam Constitutional:      General: He is not in acute distress.    Appearance: He is well-developed. He is obese.     Comments: NAD  Eyes:     Conjunctiva/sclera: Conjunctivae normal.     Pupils: Pupils are equal, round, and reactive to light.  Neck:     Thyroid: No thyromegaly.     Vascular: No JVD.  Cardiovascular:     Rate and Rhythm: Normal rate and regular rhythm.     Heart sounds: Normal heart  sounds. No murmur heard.   No friction rub. No gallop.  Pulmonary:     Effort: Pulmonary effort is normal. No respiratory distress.     Breath sounds: Normal breath sounds. No wheezing or rales.  Chest:     Chest wall: No tenderness.  Abdominal:     General: Bowel sounds are normal. There is no distension.     Palpations: Abdomen is soft. There is no mass.     Tenderness: There is no abdominal tenderness. There is no guarding or rebound.  Musculoskeletal:        General: Tenderness present. Normal range of motion.     Cervical back: Normal range of motion.  Lymphadenopathy:     Cervical: No cervical adenopathy.  Skin:    General: Skin is warm and dry.     Findings: No rash.  Neurological:     Mental Status: He is alert and oriented to person, place, and time.     Cranial Nerves: No cranial nerve deficit.     Motor: No abnormal muscle tone.     Coordination: Coordination normal.     Gait: Gait abnormal.     Deep Tendon Reflexes: Reflexes are normal and symmetric.  Psychiatric:        Behavior: Behavior normal.        Thought Content: Thought content normal.        Judgment: Judgment normal.    Lab Results  Component Value Date   WBC 6.0 01/08/2021   HGB 14.5 01/08/2021   HCT 43.0 01/08/2021   PLT 222 01/08/2021   GLUCOSE 114 (H) 01/08/2021   CHOL 181 07/13/2021   TRIG 280 (H) 07/13/2021   HDL 35 (L) 07/13/2021   LDLDIRECT 110 (H) 10/07/2019   LDLCALC 98 07/13/2021   ALT 35 07/13/2021   AST 30 07/13/2021   NA 139 01/08/2021   K 4.1 01/08/2021   CL 102 01/08/2021   CREATININE 0.77 01/08/2021   BUN 16 01/08/2021   CO2 20 01/08/2021   TSH 5.980 (H) 01/08/2021   PSA 1.18 07/28/2020   INR 1.0 08/29/2017   HGBA1C 5.7 (H) 01/08/2021    No results found.  Assessment & Plan:   Problem List Items Addressed This Visit     CAD (coronary artery disease)    Cont on Nexletol, Zetia, Fish oil for lipids. F/u w/Dr Revankar.      Dyslipidemia    On Nexletol, Zetia,  Fish oil for lipids. F/u w/dr Revankar.      KNEE PAIN    Continue on Tramadol prn  Potential benefits of a long term tramadol use as  well as potential risks (i.e. addiction risk, apnea etc) and complications (i.e. Somnolence, constipation and others) were explained to the patient and were aknowledged.      Low back pain    Cont on Tramadol  Potential benefits of a long term opioids use as well as potential risks (i.e. addiction risk, apnea etc) and complications (i.e. Somnolence, constipation and others) were explained to the patient and were aknowledged.      Other Visit Diagnoses     Needs flu shot    -  Primary   Relevant Orders   Flu Vaccine QUAD High Dose(Fluad) (Completed)         Follow-up: Return in about 3 months (around 11/12/2021) for a follow-up visit.  Walker Kehr, MD

## 2021-08-13 NOTE — Assessment & Plan Note (Signed)
Continue on Tramadol prn  Potential benefits of a long term tramadol use as well as potential risks (i.e. addiction risk, apnea etc) and complications (i.e. Somnolence, constipation and others) were explained to the patient and were aknowledged.

## 2021-08-13 NOTE — Assessment & Plan Note (Signed)
Cont on Nexletol, Zetia, Fish oil for lipids. F/u w/Dr Revankar.

## 2021-08-13 NOTE — Assessment & Plan Note (Signed)
Cont on Tramadol  Potential benefits of a long term opioids use as well as potential risks (i.e. addiction risk, apnea etc) and complications (i.e. Somnolence, constipation and others) were explained to the patient and were aknowledged. 

## 2021-08-24 ENCOUNTER — Ambulatory Visit: Payer: Medicare HMO | Admitting: Pharmacist Clinician (PhC)/ Clinical Pharmacy Specialist

## 2021-08-24 ENCOUNTER — Other Ambulatory Visit: Payer: Self-pay

## 2021-08-24 DIAGNOSIS — E785 Hyperlipidemia, unspecified: Secondary | ICD-10-CM

## 2021-08-24 NOTE — Patient Instructions (Addendum)
Your Results:             Your most recent labs Goal  Total Cholesterol 181 < 200  Triglycerides 280 < 150  HDL (happy/good cholesterol) 35 > 40  LDL (lousy/bad cholesterol 98 < 70      Medication changes:  Continue with ezetimibe.  Start Red yeast rice (Nature Made)  Start fish oil - omega 3 (Nature Made) - 2 capsules twice daily  Increase fiber consumption - consider FiberCon or Benefiber (drink plenty of water)  Try eating almonds daily as well as oatmeal.  Lab orders:  Repeat labs in January, at that time if not at goal we can consider other options.   Thank you for choosing [facility]

## 2021-08-24 NOTE — Progress Notes (Signed)
09/10/2021 [name] 30-Jun-1951 [id number]   HPI:  [name] is a 70 y.o. male patient of [name], who presents today for a lipid clinic evaluation.  See pertinent past medical history below.  Patient has been intolerant to multiple statin drugs as well as bempedoic acid in the past.   Past Medical History: CAD Mild non-obstructive disease by angiography in 2018  hypertension BP currently on no medications    Current Medications: ezetimibe 5 mg daily   Cholesterol Goals: LDL < 70   Intolerant/previously tried: Vascepa - palpitations Statins - myalgias Nexletol - stomach cramps, jitteriness, intensifies knee pains  Diet: Totally changed diet since late summer- had been eating fast food x 2 weeks straight before last blood draw.  Stopped this and now 4 small meals  - oatmeal for breakfast (was 3 eggs, [place] bacon), gave up soda and most sweets, still just ice cream every few days;  stopped red meat, mostly veggies, chicken [place] and salad.  Has lost 18 pounds in past 2 months  Exercise:  uses elliptical 20 minutes, 5 days per week, then other resistance exercises  Labs: 8/22:  TC 181, TG 280, HDL 35, LDL 98   Current Outpatient Medications  Medication Sig Dispense Refill   acetaminophen (TYLENOL) 500 MG tablet Take 500 mg by mouth every 6 (six) hours as needed for headache.     ALPRAZolam (XANAX) 0.5 MG tablet Take 0.5 mg by mouth 2 (two) times daily as needed for anxiety.     aspirin 81 MG tablet Take 81 mg by mouth at bedtime.     Cyanocobalamin (VITAMIN B-12) 2500 MCG SUBL Place 5,000 mcg under the tongue daily.     escitalopram (LEXAPRO) 10 MG tablet Take 1 tablet (10 mg total) by mouth daily. 90 tablet 3   ezetimibe (ZETIA) 10 MG tablet Take 0.5 tablets (5 mg total) by mouth daily. 45 tablet 3   fluticasone (FLONASE) 50 MCG/ACT nasal spray Place 1 spray into both nostrils daily.     loratadine (CLARITIN) 10 MG tablet Take 10 mg by mouth daily.     meloxicam  (MOBIC) 7.5 MG tablet Take 2 tablets by mouth once daily 180 tablet 0   Multiple Vitamin (MULTIVITAMIN WITH MINERALS) TABS tablet Take 1 tablet by mouth daily.     nitroGLYCERIN (NITROSTAT) 0.4 MG SL tablet Place 1 tablet (0.4 mg total) under the tongue every 5 (five) minutes as needed for chest pain. 25 tablet 6   pantoprazole (PROTONIX) 40 MG tablet Take 1 tablet by mouth twice daily 180 tablet 3   traMADol (ULTRAM) 50 MG tablet Take 50-100 mg by mouth 3 (three) times daily as needed for pain.     valACYclovir (VALTREX) 500 MG tablet Take 1 tablet by mouth once daily 90 tablet 1   OVER THE COUNTER MEDICATION Take 3 capsules by mouth every morning. Flexnow  (Patient not taking: Reported on 08/24/2021)     No current facility-administered medications for this visit.    Allergies  Allergen Reactions   Hytrin [Terazosin] Other (See Comments)    "Makes me jittery"   Icosapent Ethyl     Can't take Vascepa - palpitations   Levaquin [Levofloxacin In D5w]     Cramps    Lipitor [Atorvastatin]     myalgias   Nexletol [Bempedoic Acid] Nausea And Vomiting   Penicillins Other (See Comments)    Convulsions Has patient had a PCN reaction causing immediate rash, facial/tongue/throat swelling, SOB or lightheadedness  with hypotension: No Has patient had a PCN reaction causing severe rash involving mucus membranes or skin necrosis: No Has patient had a PCN reaction that required hospitalization: Yes Has patient had a PCN reaction occurring within the last 10 years: No If all of the above answers are "NO", then may proceed with Cephalosporin use.    Reglan [Metoclopramide]     Jerking, pains, CP   Xanax [Alprazolam]     "drunk" feeling    Past Medical History:  Diagnosis Date   Actinic keratosis    Acute sinusitis 06/21/2010   9/14, 10/19    Allergy    Anxiety 08/24/2017   Chronic  Worse in 10/18 Xanax prn - d/c  Potential benefits of a long term benzodiazepines  use as well as potential  risks  and complications were explained to the patient and were aknowledged. 11/18 Lexapro     Arthralgia 04/14/2017   2018 2019 Rheum consult; Gluten free trial Andre Estanislado Pandy 12/[facility] Depo-medrol IM Increase Tramadol w/caution  Potential benefits of a long term opioids use as well as potential risks (i.e. addiction risk, apnea etc) and complications (i.e. Somnolence, constipation and others) were explained to the patient and were aknowledged.   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 Pravastatin - pt stopped due to side effects 1/21 Zetia - causing pain too Krill oil d/c.  Repatha option discussed.  9/21 Vascepa, Zetia   Chest pain 12/07/2020   Chondrocalcinosis 07/06/2018   Chronic sinusitis 08/12/2013    Sinus congestion, SOB - better after surgery - nov 2014 9/14 worse CT IMPRESSION:  Minimal sinus mucosal thickening, primarily in the frontal recesses.  Small right maxillary mucous retention cyst. No CT evidence of acute  sinusitis.  Electronically Signed  By: Lars Pinks M.D.  On: 08/15/2013 09:19  Doxy x 4 wks   Colonic polyp    COLONIC POLYPS, HX OF    CONTACT DERMATITIS DUE TO SOLVENTS    DDD (degenerative disc disease), cervical 07/06/2018   DDD (degenerative disc disease), lumbar 07/06/2018   and facet joint arthropathy   Dizzinesses 07/18/2017   9/18 I suggested a brain MRI to r/o acustic neuroma etc: he will think about it and check on cost ENT ref suggested   DOE (dyspnea on  exertion) 07/25/2012   related to eating shrimp   Dyslipidemia 06/21/2013   Chronic  Elev TG, low HDL, nl LDL Pravastatin - pt stopped due to side effects 1/21 Repatha option discussed. On Zetia - c/o side effeects 9/21 Vascepa, Zetia   Fatigue 06/09/2015   2016 no OSA  2018 much better (60%) off Reglan.    Folliculitis 67/67/2094   GERD 05/09/2008   Chronic  -- Protonix 5/18 We discussed his GERD/Barrett's treated at [facility] (Andre Ferdinand Lango). Reglan was d/c'd due to CP. He was dx'd w/pancreatitis and started on Zenpep He had an abd CT, [place], EGD and colonoscopy. 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 Off Tramadol 2019 Rheum consult; Gluten free trial Tramadol  Potential benefits of a long term opioids use as well as potential risks (i.e. addiction risk, apnea etc) and complications (i.e. Somnolence, constipation and others) were explained to the patient and were aknowledged.   Nausea 07/18/2017   Chronic w/dizziness I suggested a brain MRI to r/o acustic neuroma etc: he will think about it and check on cost 9/18   Neoplasm of uncertain behavior of skin    OA (osteoarthritis) of knee    Obesity (BMI 30.0-34.9) 01/07/2021   ONYCHOMYCOSIS    OSA (obstructive sleep apnea) 08/21/2013   2014 no need for CPAP    Preop exam for internal medicine    Primary osteoarthritis of both hands 11/21/2008    Chronic and severe Tramadol  Potential benefits of a long term opioids use as well as potential risks (i.e. addiction risk, apnea etc) and complications (i.e. Somnolence, constipation and others) were explained to the patient and were aknowledged.   10/18 Body pains are much better (60%) off Reglan. 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 Xanax prn - d/c  Potential benefits of a long term benzodiazepines  use as well as potential risks  and complications were explained to the patient and were aknowledged. Neurol ref - Andre Tat:   "Hi   Upper respiratory infection 07/27/2018   9/19, 12/21   Well adult exam     Blood pressure (!) 152/86, pulse 80, resp. rate 17, height 6' (1.829 m), weight 234 lb (106.1 kg), SpO2 96 %.   Dyslipidemia Patient with noted CAD and LDL currently at 98 on ezetimibe 10 mg daily.  Reviewed options for lowering LDL cholesterol, including PCSK-9 inhibitors and inclisiran.  Discussed mechanisms of action, dosing, side effects and potential decreases in LDL cholesterol.  Also reviewed cost information and potential options for patient assistance.  Answered all patient questions.  Based on this information, patient would prefer to continue working on dietary changes at this time.  He would prefer to check labs in another 2-3 months and if still elevated, we can  talk via phone and discuss PCSK-9 inhibitor vs inclisiran.   He will have labs drawn at the beginning of 2023.     Tommy Medal PharmD CPP [facility] [address] [place], [place] [address] [phone]

## 2021-09-10 ENCOUNTER — Encounter: Payer: Self-pay | Admitting: Pharmacist Clinician (PhC)/ Clinical Pharmacy Specialist

## 2021-09-10 NOTE — Assessment & Plan Note (Signed)
Patient with noted CAD and LDL currently at 98 on ezetimibe 10 mg daily.  Reviewed options for lowering LDL cholesterol, including PCSK-9 inhibitors and inclisiran.  Discussed mechanisms of action, dosing, side effects and potential decreases in LDL cholesterol.  Also reviewed cost information and potential options for patient assistance.  Answered all patient questions.  Based on this information, patient would prefer to continue working on dietary changes at this time.  He would prefer to check labs in another 2-3 months and if still elevated, we can talk via phone and discuss PCSK-9 inhibitor vs inclisiran.  He will have labs drawn at the beginning of 2023.

## 2021-09-15 ENCOUNTER — Other Ambulatory Visit: Payer: Self-pay | Admitting: Internal Medicine

## 2021-09-15 MED ORDER — MOLNUPIRAVIR EUA 200MG CAPSULE: 4.0000 | ORAL_CAPSULE | Freq: Two times a day (BID) | ORAL | 0 refills | Status: AC

## 2021-09-15 NOTE — Progress Notes (Signed)
COVID-19

## 2021-10-03 ENCOUNTER — Other Ambulatory Visit: Payer: Self-pay | Admitting: Internal Medicine

## 2021-10-14 ENCOUNTER — Other Ambulatory Visit: Payer: Self-pay | Admitting: Internal Medicine

## 2021-10-29 ENCOUNTER — Ambulatory Visit (INDEPENDENT_AMBULATORY_CARE_PROVIDER_SITE_OTHER): Payer: Medicare HMO

## 2021-10-29 ENCOUNTER — Other Ambulatory Visit: Payer: Self-pay

## 2021-10-29 DIAGNOSIS — Z0000 Encounter for general adult medical examination without abnormal findings: Secondary | ICD-10-CM

## 2021-10-29 NOTE — Patient Instructions (Signed)
[name] , Thank you for taking time to come for your Medicare Wellness Visit. I appreciate your ongoing commitment to your health goals. Please review the following plan we discussed and let me know if I can assist you in the future.   Screening recommendations/referrals: Colonoscopy: 12/06/2019  due 11/2021  patient aware will schedule  Recommended yearly ophthalmology/optometry visit for glaucoma screening and checkup Recommended yearly dental visit for hygiene and checkup  Vaccinations: Influenza vaccine: completed  Pneumococcal vaccine: completed  Tdap vaccine: 09/258/2021 Shingles vaccine: will consider     Advanced directives: none   Conditions/risks identified: none   Next appointment: none   Preventive Care 68 Years and Older, Male Preventive care refers to lifestyle choices and visits with your health care provider that can promote health and wellness. What does preventive care include? A yearly physical exam. This is also called an annual well check. Dental exams once or twice a year. Routine eye exams. Ask your health care provider how often you should have your eyes checked. Personal lifestyle choices, including: Daily care of your teeth and gums. Regular physical activity. Eating a healthy diet. Avoiding tobacco and drug use. Limiting alcohol use. Practicing safe sex. Taking low doses of aspirin every day. Taking vitamin and mineral supplements as recommended by your health care provider. What happens during an annual well check? The services and screenings done by your health care provider during your annual well check will depend on your age, overall health, lifestyle risk factors, and family history of disease. Counseling  Your health care provider may ask you questions about your: Alcohol use. Tobacco use. Drug use. Emotional well-being. Home and relationship well-being. Sexual activity. Eating habits. History of falls. Memory and ability to understand  (cognition). Work and work Statistician. Screening  You may have the following tests or measurements: Height, weight, and BMI. Blood pressure. Lipid and cholesterol levels. These may be checked every 5 years, or more frequently if you are over 57 years old. Skin check. Lung cancer screening. You may have this screening every year starting at age 35 if you have a 30-pack-year history of smoking and currently smoke or have quit within the past 15 years. Fecal occult blood test (FOBT) of the stool. You may have this test every year starting at age 41. Flexible sigmoidoscopy or colonoscopy. You may have a sigmoidoscopy every 5 years or a colonoscopy every 10 years starting at age 54. Prostate cancer screening. Recommendations will vary depending on your family history and other risks. Hepatitis C blood test. Hepatitis B blood test. Sexually transmitted disease (STD) testing. Diabetes screening. This is done by checking your blood sugar (glucose) after you have not eaten for a while (fasting). You may have this done every 1-3 years. Abdominal aortic aneurysm (AAA) screening. You may need this if you are a current or former smoker. Osteoporosis. You may be screened starting at age 63 if you are at high risk. Talk with your health care provider about your test results, treatment options, and if necessary, the need for more tests. Vaccines  Your health care provider may recommend certain vaccines, such as: Influenza vaccine. This is recommended every year. Tetanus, diphtheria, and acellular pertussis (Tdap, Td) vaccine. You may need a Td booster every 10 years. Zoster vaccine. You may need this after age 15. Pneumococcal 13-valent conjugate (PCV13) vaccine. One dose is recommended after age 66. Pneumococcal polysaccharide (PPSV23) vaccine. One dose is recommended after age 38. Talk to your health care provider about which  screenings and vaccines you need and how often you need them. This  information is not intended to replace advice given to you by your health care provider. Make sure you discuss any questions you have with your health care provider. Document Released: 11/27/2015 Document Revised: 07/20/2016 Document Reviewed: 09/01/2015 Elsevier Interactive Patient Education  2017 [employer] Prevention in the Home Falls can cause injuries. They can happen to people of all ages. There are many things you can do to make your home safe and to help prevent falls. What can I do on the outside of my home? Regularly fix the edges of walkways and driveways and fix any cracks. Remove anything that might make you trip as you walk through a door, such as a raised step or threshold. Trim any bushes or trees on the path to your home. Use bright outdoor lighting. Clear any walking paths of anything that might make someone trip, such as rocks or tools. Regularly check to see if handrails are loose or broken. Make sure that both sides of any steps have handrails. Any raised decks and porches should have guardrails on the edges. Have any leaves, snow, or ice cleared regularly. Use sand or salt on walking paths during winter. Clean up any spills in your garage right away. This includes oil or grease spills. What can I do in the bathroom? Use night lights. Install grab bars by the toilet and in the tub and shower. Do not use towel bars as grab bars. Use non-skid mats or decals in the tub or shower. If you need to sit down in the shower, use a plastic, non-slip stool. Keep the floor dry. Clean up any water that spills on the floor as soon as it happens. Remove soap buildup in the tub or shower regularly. Attach bath mats securely with double-sided non-slip rug tape. Do not have throw rugs and other things on the floor that can make you trip. What can I do in the bedroom? Use night lights. Make sure that you have a light by your bed that is easy to reach. Do not use any sheets or  blankets that are too big for your bed. They should not hang down onto the floor. Have a firm chair that has side arms. You can use this for support while you get dressed. Do not have throw rugs and other things on the floor that can make you trip. What can I do in the kitchen? Clean up any spills right away. Avoid walking on wet floors. Keep items that you use a lot in easy-to-reach places. If you need to reach something above you, use a strong step stool that has a grab bar. Keep electrical cords out of the way. Do not use floor polish or wax that makes floors slippery. If you must use wax, use non-skid floor wax. Do not have throw rugs and other things on the floor that can make you trip. What can I do with my stairs? Do not leave any items on the stairs. Make sure that there are handrails on both sides of the stairs and use them. Fix handrails that are broken or loose. Make sure that handrails are as long as the stairways. Check any carpeting to make sure that it is firmly attached to the stairs. Fix any carpet that is loose or worn. Avoid having throw rugs at the top or bottom of the stairs. If you do have throw rugs, attach them to the floor with carpet  tape. Make sure that you have a light switch at the top of the stairs and the bottom of the stairs. If you do not have them, ask someone to add them for you. What else can I do to help prevent falls? Wear shoes that: Do not have high heels. Have rubber bottoms. Are comfortable and fit you well. Are closed at the toe. Do not wear sandals. If you use a stepladder: Make sure that it is fully opened. Do not climb a closed stepladder. Make sure that both sides of the stepladder are locked into place. Ask someone to hold it for you, if possible. Clearly mark and make sure that you can see: Any grab bars or handrails. First and last steps. Where the edge of each step is. Use tools that help you move around (mobility aids) if they are  needed. These include: Canes. Walkers. Scooters. Crutches. Turn on the lights when you go into a dark area. Replace any light bulbs as soon as they burn out. Set up your furniture so you have a clear path. Avoid moving your furniture around. If any of your floors are uneven, fix them. If there are any pets around you, be aware of where they are. Review your medicines with your doctor. Some medicines can make you feel dizzy. This can increase your chance of falling. Ask your doctor what other things that you can do to help prevent falls. This information is not intended to replace advice given to you by your health care provider. Make sure you discuss any questions you have with your health care provider. Document Released: 08/27/2009 Document Revised: 04/07/2016 Document Reviewed: 12/05/2014 Elsevier Interactive Patient Education  2017 [employer].

## 2021-10-29 NOTE — Progress Notes (Addendum)
Subjective:   [name] is a 70 y.o. male who presents for an Subsequent Medicare Annual Wellness Visit.  I connected with [name] today by telephone and verified that I am speaking with the correct person using two identifiers. Location patient: home Location provider: work Persons participating in the virtual visit: patient, provider.   I discussed the limitations, risks, security and privacy concerns of performing an evaluation and management service by telephone and the availability of in person appointments. I also discussed with the patient that there may be a patient responsible charge related to this service. The patient expressed understanding and verbally consented to this telephonic visit.    Interactive audio and video telecommunications were attempted between this provider and patient, however failed, due to patient having technical difficulties OR patient did not have access to video capability.  We continued and completed visit with audio only.    Review of Systems     Cardiac Risk Factors include: advanced age (>68men, >58 women)     Objective:    Today's Vitals   There is no height or weight on file to calculate BMI.  Advanced Directives 10/29/2021 08/25/2020 10/04/2019 04/15/2019 12/25/2017 08/31/2017 08/21/2017  Does Patient Have a Medical Advance Directive? No No No No No No No  Would patient like information on creating a medical advance directive? No - Patient declined No - Patient declined Yes (ED - Information included in AVS) Yes (ED - Information included in AVS) Yes (ED - Information included in AVS) No - Patient declined No - Patient declined  Pre-existing out of facility DNR order (yellow form or pink MOST form) - - - - - - -    Current Medications (verified) Outpatient Encounter Medications as of 10/29/2021  Medication Sig   acetaminophen (TYLENOL) 500 MG tablet Take 500 mg by mouth every 6 (six) hours as needed for headache.   ALPRAZolam (XANAX)  0.5 MG tablet Take 0.5 mg by mouth 2 (two) times daily as needed for anxiety.   aspirin 81 MG tablet Take 81 mg by mouth at bedtime.   Cyanocobalamin (VITAMIN B-12) 2500 MCG SUBL Place 5,000 mcg under the tongue daily.   escitalopram (LEXAPRO) 10 MG tablet Take 1 tablet (10 mg total) by mouth daily.   ezetimibe (ZETIA) 10 MG tablet Take 0.5 tablets (5 mg total) by mouth daily.   fluticasone (FLONASE) 50 MCG/ACT nasal spray Use 1 spray(s) in each nostril once daily   loratadine (CLARITIN) 10 MG tablet Take 10 mg by mouth daily.   meloxicam (MOBIC) 7.5 MG tablet Take 2 tablets by mouth once daily   Multiple Vitamin (MULTIVITAMIN WITH MINERALS) TABS tablet Take 1 tablet by mouth daily.   nitroGLYCERIN (NITROSTAT) 0.4 MG SL tablet Place 1 tablet (0.4 mg total) under the tongue every 5 (five) minutes as needed for chest pain.   OVER THE COUNTER MEDICATION Take 3 capsules by mouth every morning. Flexnow   pantoprazole (PROTONIX) 40 MG tablet Take 1 tablet by mouth twice daily   traMADol (ULTRAM) 50 MG tablet Take 50-100 mg by mouth 3 (three) times daily as needed for pain.   valACYclovir (VALTREX) 500 MG tablet Take 1 tablet by mouth once daily   No facility-administered encounter medications on file as of 10/29/2021.    Allergies (verified) Hytrin [terazosin], Icosapent ethyl, Levaquin [levofloxacin in d5w], Lipitor [atorvastatin], Nexletol [bempedoic acid], Penicillins, Reglan [metoclopramide], and Xanax [alprazolam]   History: Past Medical History:  Diagnosis Date   Actinic keratosis  Acute sinusitis 06/21/2010   9/14, 10/19    Allergy    Anxiety 08/24/2017   Chronic  Worse in 10/18 Xanax prn - d/c  Potential benefits of a long term benzodiazepines  use as well as potential risks  and complications were explained to the patient and were aknowledged. 11/18 Lexapro     Arthralgia 04/14/2017   2018 2019 Rheum consult; Gluten free trial Dr Estanislado Pandy [facility]  [facility] offered Depo-medrol IM Increase Tramadol w/caution  Potential benefits of a long term opioids use as well as potential risks (i.e. addiction risk, apnea etc) and complications (i.e. Somnolence, constipation and others) were explained to the patient and were aknowledged.   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 Pravastatin - pt stopped due to side effects 1/21 Zetia - causing pain too Krill oil d/c.  Repatha option discussed.  9/21 Vascepa, Zetia   Chest pain 12/07/2020   Chondrocalcinosis 07/06/2018   Chronic sinusitis 08/12/2013    Sinus congestion, SOB - better after surgery - nov 2014 9/14 worse CT IMPRESSION:  Minimal sinus mucosal thickening, primarily in the frontal recesses.  Small right maxillary mucous retention cyst. No CT evidence of acute  sinusitis.  Electronically Signed  By: Lars Pinks M.D.  On: 08/15/2013 09:19  Doxy x 4 wks   Colonic polyp    COLONIC POLYPS, HX OF    CONTACT DERMATITIS DUE TO SOLVENTS    DDD (degenerative disc disease), cervical 07/06/2018   DDD (degenerative disc disease), lumbar 07/06/2018   and facet joint arthropathy   Dizzinesses 07/18/2017   9/18 I suggested a brain MRI to r/o acustic neuroma etc: he will think about it and check on cost ENT ref suggested   DOE (dyspnea on exertion) 07/25/2012   related to eating shrimp   Dyslipidemia 06/21/2013   Chronic  Elev TG, low HDL, nl LDL Pravastatin - pt stopped due to side effects 1/21 Repatha option discussed. On Zetia - c/o side effeects  9/21 Vascepa, Zetia   Fatigue 06/09/2015   2016 no OSA  2018 much better (60%) off Reglan.    Folliculitis 29/93/7169   GERD 05/09/2008   Chronic  -- Protonix 5/18 We discussed his GERD/Barrett's treated at [facility] (Dr Ferdinand Lango). Reglan was d/c'd due to CP. He was dx'd w/pancreatitis and started on Zenpep He had an abd CT, [place], EGD and colonoscopy. He would like to have a 2nd opinion here - will refer   GERD (gastroesophageal reflux disease)    Hand eczema 07/07/2015   8/16 dyshidrotic eczema B    HAND PAIN    Heart murmur    HERPES, GENITAL NOS    Hyperglycemia 06/09/2015   Mild     Hypogonadism in male 04/25/2018   Declined testosterone Rx   Impacted cerumen of left ear 12/24/2020   Ingrowing toenail of left foot 07/25/2012   is resolved now 07-25-12   KNEE PAIN    Laryngopharyngeal reflux (  LPR) 10/02/2018   Localized swelling, mass or lump of neck 09/05/2018   Low back pain 06/09/2015   Per Dr Alvan Dame 3/17 - planning to see Dr Annette Stable Body pains are much better (60%) off Reglan. Off Tramadol 2019 Rheum consult; Gluten free trial Tramadol  Potential benefits of a long term opioids use as well as potential risks (i.e. addiction risk, apnea etc) and complications (i.e. Somnolence, constipation and others) were explained to the patient and were aknowledged.   Nausea 07/18/2017   Chronic w/dizziness I suggested a brain MRI to r/o acustic neuroma etc: he will think about it and check on cost 9/18   Neoplasm of uncertain behavior of skin    OA (osteoarthritis) of knee    Obesity (BMI 30.0-34.9) 01/07/2021   ONYCHOMYCOSIS    OSA (obstructive sleep apnea) 08/21/2013   2014 no need for CPAP    Preop exam for internal medicine    Primary osteoarthritis of both hands 11/21/2008   Chronic and severe Tramadol  Potential benefits of a long term opioids use as well as potential risks (i.e. addiction risk, apnea etc) and complications (i.e. Somnolence, constipation and others) were explained to  the patient and were aknowledged.   10/18 Body pains are much better (60%) off Reglan. 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 Xanax prn - d/c  Potential benefits of a long term benzodiazepines  use as well as potential risks  and complications were explained to the patient and were aknowledged. Neurol ref - Dr Tat:   "Hi   Upper respiratory infection 07/27/2018   9/19, 12/21   Well adult exam    Past Surgical History:  Procedure Laterality Date   COLONOSCOPY  04/24/2019   JOINT REPLACEMENT  9/13   L TKR   KNEE ARTHROSCOPY     Left   LEFT HEART CATH AND CORONARY ANGIOGRAPHY N/A 08/31/2017   Procedure: LEFT HEART CATH AND CORONARY ANGIOGRAPHY;  Surgeon: Nelva Bush, MD;  Location: [place] [facility];  Service: Cardiovascular;  Laterality: N/A;   TOTAL KNEE ARTHROPLASTY  07/31/2012   Procedure: TOTAL KNEE ARTHROPLASTY;  Surgeon: Sydnee Cabal, MD;  Location: [facility];  Service: Orthopedics;  Laterality: Left;   UMBILICAL HERNIA REPAIR  07-25-12   10 yrs ago   UPPER GASTROINTESTINAL ENDOSCOPY  04/24/2019   Family History  Problem Relation Age of Onset   Diabetes Mother 18   Heart disease Father 70       CAD, MI, CHF   Lung cancer Brother        smoker   Coronary artery disease Other    Colon polyps Brother    Heart disease Brother 23       CHF   Colon cancer Brother    Healthy Daughter    Stomach cancer Neg Hx     Rectal cancer Neg Hx  Esophageal cancer Neg Hx    Social History   Socioeconomic History   Marital status: Married    Spouse name: Not on file   Number of children: 2   Years of education: Not on file   Highest education level: Not on file  Occupational History   Occupation: retired  Tobacco Use   Smoking status: Former    Types: Cigars    Quit date: 08/30/2007    Years since quitting: 14.1   Smokeless tobacco: Never   Tobacco comments:    occ. cigar  Vaping Use   Vaping Use: Never used  Substance and Sexual Activity   Alcohol use: Yes    Comment: occ.    Drug use: Yes    Types: Marijuana    Comment: last marijuana this am, 2 "hits" per patient   Sexual activity: Yes    Partners: Female    Birth control/protection: None  Other Topics Concern   Not on file  Social History Narrative   Not on file   Social Determinants of Health   Financial Resource Strain: Low Risk    Difficulty of Paying Living Expenses: Not hard at all  Food Insecurity: No Food Insecurity   Worried About Charity fundraiser in the Last Year: Never true   [place] in the Last Year: Never true  Transportation Needs: No Transportation Needs   Lack of Transportation (Medical): No   Lack of Transportation (Non-Medical): No  Physical Activity: Sufficiently Active   Days of Exercise per Week: 5 days   Minutes of Exercise per Session: 30 min  Stress: No Stress Concern Present   Feeling of Stress : Not at all  Social Connections: Moderately Integrated   Frequency of Communication with Friends and Family: Twice a week   Frequency of Social Gatherings with Friends and Family: Twice a week   Attends Religious Services: 1 to 4 times per year   Active Member of [employer] or Organizations: No   Attends Music therapist: Never   Marital Status: Married    Tobacco Counseling Counseling given: Not Answered Tobacco comments: occ. cigar   Clinical Intake:  Pre-visit preparation  completed: Yes  Pain : No/denies pain     Nutritional Risks: None Diabetes: No  How often do you need to have someone help you when you read instructions, pamphlets, or other written materials from your doctor or pharmacy?: 1 - Never What is the last grade level you completed in school?: High school  Diabetic?no   Interpreter Needed?: No  Information entered by :: L.Kourtlyn Charlet,LPN   Activities of Daily Living In your present state of health, do you have any difficulty performing the following activities: 10/29/2021  Hearing? N  Vision? N  Difficulty concentrating or making decisions? N  Walking or climbing stairs? N  Dressing or bathing? N  Doing errands, shopping? N  Preparing Food and eating ? N  Using the Toilet? N  In the past six months, have you accidently leaked urine? N  Do you have problems with loss of bowel control? N  Managing your Medications? N  Managing your Finances? N  Housekeeping or managing your Housekeeping? N  Some recent data might be hidden    Patient Care Team: Plotnikov, Evie Lacks, MD as PCP - General Revankar, Reita Cliche, MD as PCP - Cardiology (Cardiology) Josue Hector, MD (Cardiology) Sydnee Cabal, MD (Orthopedic Surgery) Irene Shipper, MD as Consulting Physician (Gastroenterology) Revankar, Reita Cliche, MD as  Consulting Physician (Cardiology) Tat, Eustace Quail, DO as Consulting Physician (Neurology)  Indicate any recent Medical Services you may have received from other than [facility] providers in the past year (date may be approximate).     Assessment:   This is a routine wellness examination for Sahir.  Hearing/Vision screen Vision Screening - Comments:: Annual eye wears glasses   Dietary issues and exercise activities discussed: Current Exercise Habits: Home exercise routine, Type of exercise: walking, Time (Minutes): 30, Frequency (Times/Week): 5, Weekly Exercise (Minutes/Week): 150, Intensity: Mild, Exercise limited by: orthopedic  condition(s)   Goals Addressed             This Visit's Progress    Patient Stated   On track    I want to continue to exercise, eat healthy, enjoy family and camp       Depression Screen PHQ 2/9 Scores 10/29/2021 10/29/2021 08/25/2020 05/06/2020 04/15/2019 12/25/2017 09/29/2017  PHQ - 2 Score 0 0 0 0 1 1 5   PHQ- 9 Score - - - - 3 1 20   Exception Documentation - - - - - - -    Fall Risk Fall Risk  10/29/2021 08/25/2020 05/06/2020 04/15/2019 12/25/2017  Falls in the past year? 0 1 0 1 Yes  Number falls in past yr: 0 0 0 0 -  Injury with Fall? 0 0 0 0 No  Risk for fall due to : - No Fall Risks - - -  Follow up Falls evaluation completed Falls evaluation completed - Falls prevention discussed -    FALL RISK PREVENTION PERTAINING TO THE HOME:  Any stairs in or around the home? Yes  If so, are there any without handrails? No  Home free of loose throw rugs in walkways, pet beds, electrical cords, etc? Yes  Adequate lighting in your home to reduce risk of falls? Yes   ASSISTIVE DEVICES UTILIZED TO PREVENT FALLS:  Life alert? No  Use of a cane, walker or w/c? Yes  Grab bars in the bathroom? Yes  Shower chair or bench in shower? Yes  Elevated toilet seat or a handicapped toilet? No    Cognitive Function:  Normal cognitive status assessed by direct observation by this Nurse Health Advisor. No abnormalities found.        Immunizations Immunization History  Administered Date(s) Administered   Fluad Quad(high Dose 65+) 07/25/2019, 08/11/2020, 08/13/2021   Influenza Split 07/20/2012   Influenza Whole 08/15/2011   Influenza, High Dose Seasonal PF 07/27/2016, 07/27/2018   Influenza,inj,Quad PF,6+ Mos 08/12/2013, 07/18/2017   Influenza-Unspecified 07/15/2014, 10/15/2015, 08/02/2016   PFIZER(Purple Top)SARS-COV-2 Vaccination 01/16/2020, 02/12/2020   Pneumococcal Conjugate-13 11/01/2018   Pneumococcal Polysaccharide-23 08/11/2020   Td 01/28/2010   Tdap 08/11/2020   Zoster, Live  12/23/2013    TDAP status: Up to date  Flu Vaccine status: Up to date  Pneumococcal vaccine status: Up to date  Covid-19 vaccine status: Completed vaccines  Qualifies for Shingles Vaccine? Yes   Zostavax completed No   Shingrix Completed?: No.    Education has been provided regarding the importance of this vaccine. Patient has been advised to call insurance company to determine out of pocket expense if they have not yet received this vaccine. Advised may also receive vaccine at local pharmacy or Health Dept. Verbalized acceptance and understanding.  Screening Tests Health Maintenance  Topic Date Due   Zoster Vaccines- Shingrix (1 of 2) Never done   COVID-19 Vaccine (3 - Pfizer risk series) 03/11/2020   COLONOSCOPY (Pts 45-20yrs Insurance coverage will  need to be confirmed)  12/05/2021   TETANUS/TDAP  08/11/2030   Pneumonia Vaccine 50+ Years old  Completed   INFLUENZA VACCINE  Completed   Hepatitis C Screening  Completed   HPV VACCINES  Aged Out    Health Maintenance  Health Maintenance Due  Topic Date Due   Zoster Vaccines- Shingrix (1 of 2) Never done   COVID-19 Vaccine (3 - Pfizer risk series) 03/11/2020    Colorectal cancer screening: Type of screening: Colonoscopy. Completed 12/06/2019. Repeat every 2 years  Lung Cancer Screening: (Low Dose CT Chest recommended if Age 66-80 years, 30 pack-year currently smoking OR have quit w/in 15years.) does not qualify.   Lung Cancer Screening Referral: n/a  Additional Screening:  Hepatitis C Screening: does not qualify; Completed 06/16/2016  Vision Screening: Recommended annual ophthalmology exams for early detection of glaucoma and other disorders of the eye. Is the patient up to date with their annual eye exam?  Yes  Who is the provider or what is the name of the office in which the patient attends annual eye exams? Dr.Snipps  If pt is not established with a provider, would they like to be referred to a provider to establish  care? No .   Dental Screening: Recommended annual dental exams for proper oral hygiene  Community Resource Referral / Chronic Care Management: CRR required this visit?  No   CCM required this visit?  No      Plan:     I have personally reviewed and noted the following in the patients chart:   Medical and social history Use of alcohol, tobacco or illicit drugs  Current medications and supplements including opioid prescriptions. Patient is currently taking opioid prescriptions. Information provided to patient regarding non-opioid alternatives. Patient advised to discuss non-opioid treatment plan with their provider. Functional ability and status Nutritional status Physical activity Advanced directives List of other physicians Hospitalizations, surgeries, and ER visits in previous 12 months Vitals Screenings to include cognitive, depression, and falls Referrals and appointments  In addition, I have reviewed and discussed with patient certain preventive protocols, quality metrics, and best practice recommendations. A written personalized care plan for preventive services as well as general preventive health recommendations were provided to patient.     Randel Pigg, LPN   [id number]   Nurse Notes: none   Medical screening examination/treatment/procedure(s) were performed by non-physician practitioner and as supervising physician I was immediately available for consultation/collaboration.  I agree with above. Lew Dawes, MD

## 2021-11-04 ENCOUNTER — Other Ambulatory Visit: Payer: Self-pay | Admitting: Internal Medicine

## 2021-11-22 ENCOUNTER — Ambulatory Visit: Payer: Medicare HMO | Admitting: Internal Medicine

## 2021-12-09 DIAGNOSIS — I2510 Atherosclerotic heart disease of native coronary artery without angina pectoris: Secondary | ICD-10-CM | POA: Diagnosis not present

## 2021-12-09 DIAGNOSIS — E785 Hyperlipidemia, unspecified: Secondary | ICD-10-CM | POA: Diagnosis not present

## 2021-12-09 DIAGNOSIS — I10 Essential (primary) hypertension: Secondary | ICD-10-CM | POA: Diagnosis not present

## 2021-12-10 LAB — BASIC METABOLIC PANEL
BUN/Creatinine Ratio: 18 (ref 10–24)
BUN: 16 mg/dL (ref 8–27)
CO2: 23 mmol/L (ref 20–29)
Calcium: 9.6 mg/dL (ref 8.6–10.2)
Chloride: 105 mmol/L (ref 96–106)
Creatinine, Ser: 0.87 mg/dL (ref 0.76–1.27)
Glucose: 116 mg/dL — ABNORMAL HIGH (ref 70–99)
Potassium: 4.6 mmol/L (ref 3.5–5.2)
Sodium: 143 mmol/L (ref 134–144)
eGFR: 94 mL/min/{1.73_m2} (ref >59)

## 2021-12-10 LAB — LIPID PANEL
Chol/HDL Ratio: 4.7 ratio (ref 0.0–5.0)
Cholesterol, Total: 218 mg/dL — ABNORMAL HIGH (ref 100–199)
HDL: 46 mg/dL (ref >39)
LDL Chol Calc (NIH): 149 mg/dL — ABNORMAL HIGH (ref 0–99)
Triglycerides: 130 mg/dL (ref 0–149)
VLDL Cholesterol Cal: 23 mg/dL (ref 5–40)

## 2021-12-10 LAB — CBC WITH DIFFERENTIAL/PLATELET
Basophils Absolute: 0.1 10*3/uL (ref 0.0–0.2)
Basos: 1 %
EOS (ABSOLUTE): 0.3 10*3/uL (ref 0.0–0.4)
Eos: 4 %
Hematocrit: 45.2 % (ref 37.5–51.0)
Hemoglobin: 15.4 g/dL (ref 13.0–17.7)
Immature Grans (Abs): 0 10*3/uL (ref 0.0–0.1)
Immature Granulocytes: 0 %
Lymphocytes Absolute: 3.3 10*3/uL — ABNORMAL HIGH (ref 0.7–3.1)
Lymphs: 48 %
MCH: 32 pg (ref 26.6–33.0)
MCHC: 34.1 g/dL (ref 31.5–35.7)
MCV: 94 fL (ref 79–97)
Monocytes Absolute: 0.6 10*3/uL (ref 0.1–0.9)
Monocytes: 9 %
Neutrophils Absolute: 2.6 10*3/uL (ref 1.4–7.0)
Neutrophils: 38 %
Platelets: 259 10*3/uL (ref 150–450)
RBC: 4.82 x10E6/uL (ref 4.14–5.80)
RDW: 12.4 % (ref 11.6–15.4)
WBC: 6.8 10*3/uL (ref 3.4–10.8)

## 2021-12-10 LAB — TSH: TSH: 3.59 u[IU]/mL (ref 0.450–4.500)

## 2021-12-10 LAB — HEPATIC FUNCTION PANEL
ALT: 27 IU/L (ref 0–44)
AST: 23 IU/L (ref 0–40)
Albumin: 4.7 g/dL (ref 3.8–4.8)
Alkaline Phosphatase: 73 IU/L (ref 44–121)
Bilirubin Total: 0.4 mg/dL (ref 0.0–1.2)
Bilirubin, Direct: 0.12 mg/dL (ref 0.00–0.40)
Total Protein: 7.5 g/dL (ref 6.0–8.5)

## 2021-12-13 ENCOUNTER — Other Ambulatory Visit: Payer: Self-pay

## 2021-12-13 ENCOUNTER — Ambulatory Visit (INDEPENDENT_AMBULATORY_CARE_PROVIDER_SITE_OTHER): Payer: Medicare HMO | Admitting: Internal Medicine

## 2021-12-13 ENCOUNTER — Encounter: Payer: Self-pay | Admitting: Internal Medicine

## 2021-12-13 DIAGNOSIS — M5440 Lumbago with sciatica, unspecified side: Secondary | ICD-10-CM | POA: Diagnosis not present

## 2021-12-13 DIAGNOSIS — E785 Hyperlipidemia, unspecified: Secondary | ICD-10-CM | POA: Diagnosis not present

## 2021-12-13 DIAGNOSIS — M25562 Pain in left knee: Secondary | ICD-10-CM | POA: Diagnosis not present

## 2021-12-13 DIAGNOSIS — K219 Gastro-esophageal reflux disease without esophagitis: Secondary | ICD-10-CM | POA: Diagnosis not present

## 2021-12-13 DIAGNOSIS — M25561 Pain in right knee: Secondary | ICD-10-CM | POA: Diagnosis not present

## 2021-12-13 NOTE — Assessment & Plan Note (Signed)
Cont on Tramadol  Potential benefits of a long term opioids use as well as potential risks (i.e. addiction risk, apnea etc) and complications (i.e. Somnolence, constipation and others) were explained to the patient and were aknowledged. 

## 2021-12-13 NOTE — Assessment & Plan Note (Signed)
Hold Meloxicam if worse

## 2021-12-13 NOTE — Progress Notes (Signed)
Subjective:  Patient ID: [name], male    DOB: 11-08-1951  Age: 71 y.o. MRN: [id number]  CC: Follow-up (3 month f/u)   HPI [name] presents for CAD, OA, hyperglycemia  Outpatient Medications Prior to Visit  Medication Sig Dispense Refill   acetaminophen (TYLENOL) 500 MG tablet Take 500 mg by mouth every 6 (six) hours as needed for headache.     ALPRAZolam (XANAX) 0.5 MG tablet Take 0.5 mg by mouth 2 (two) times daily as needed for anxiety.     aspirin 81 MG tablet Take 81 mg by mouth at bedtime.     Cyanocobalamin (VITAMIN B-12) 2500 MCG SUBL Place 5,000 mcg under the tongue daily.     escitalopram (LEXAPRO) 10 MG tablet Take 1 tablet (10 mg total) by mouth daily. 90 tablet 3   ezetimibe (ZETIA) 10 MG tablet Take 0.5 tablets (5 mg total) by mouth daily. 45 tablet 3   fluticasone (FLONASE) 50 MCG/ACT nasal spray Use 1 spray(s) in each nostril once daily 16 g 3   loratadine (CLARITIN) 10 MG tablet Take 10 mg by mouth daily.     meloxicam (MOBIC) 7.5 MG tablet Take 2 tablets by mouth once daily 180 tablet 0   Multiple Vitamin (MULTIVITAMIN WITH MINERALS) TABS tablet Take 1 tablet by mouth daily.     nitroGLYCERIN (NITROSTAT) 0.4 MG SL tablet Place 1 tablet (0.4 mg total) under the tongue every 5 (five) minutes as needed for chest pain. 25 tablet 6   OVER THE COUNTER MEDICATION Take 3 capsules by mouth every morning. Flexnow     pantoprazole (PROTONIX) 40 MG tablet Take 1 tablet by mouth twice daily 180 tablet 3   traMADol (ULTRAM) 50 MG tablet TAKE 1 TO 2 TABLETS BY MOUTH THREE TIMES DAILY AS NEEDED 180 tablet 1   valACYclovir (VALTREX) 500 MG tablet Take 1 tablet by mouth once daily 90 tablet 1   No facility-administered medications prior to visit.    ROS: Review of Systems  Constitutional:  Negative for appetite change, fatigue and unexpected weight change.  HENT:  Negative for congestion, nosebleeds, sneezing, sore throat and trouble swallowing.   Eyes:  Negative for  itching and visual disturbance.  Respiratory:  Negative for cough.   Cardiovascular:  Negative for chest pain, palpitations and leg swelling.  Gastrointestinal:  Negative for abdominal distention, blood in stool, diarrhea and nausea.  Genitourinary:  Negative for frequency and hematuria.  Musculoskeletal:  Positive for arthralgias and back pain. Negative for gait problem, joint swelling and neck pain.  Skin:  Negative for rash.  Neurological:  Negative for dizziness, tremors, speech difficulty and weakness.  Psychiatric/Behavioral:  Negative for agitation, dysphoric mood and sleep disturbance. The patient is not nervous/anxious.    Objective:  BP 122/70 (BP Location: Left Arm)    Pulse 79    Temp 98.6 F (37 C) (Oral)    Ht 6' (1.829 m)    Wt 226 lb 3.2 oz (102.6 kg)    SpO2 96%    BMI 30.68 kg/m   BP Readings from Last 3 Encounters:  12/13/21 122/70  08/24/21 (!) 152/86  08/13/21 130/72    Wt Readings from Last 3 Encounters:  12/13/21 226 lb 3.2 oz (102.6 kg)  08/24/21 234 lb (106.1 kg)  08/13/21 236 lb 3.2 oz (107.1 kg)    Physical Exam Constitutional:      General: He is not in acute distress.    Appearance: He is well-developed.  Comments: NAD  Eyes:     Conjunctiva/sclera: Conjunctivae normal.     Pupils: Pupils are equal, round, and reactive to light.  Neck:     Thyroid: No thyromegaly.     Vascular: No JVD.  Cardiovascular:     Rate and Rhythm: Normal rate and regular rhythm.     Heart sounds: Normal heart sounds. No murmur heard.   No friction rub. No gallop.  Pulmonary:     Effort: Pulmonary effort is normal. No respiratory distress.     Breath sounds: Normal breath sounds. No wheezing or rales.  Chest:     Chest wall: No tenderness.  Abdominal:     General: Bowel sounds are normal. There is no distension.     Palpations: Abdomen is soft. There is no mass.     Tenderness: There is no abdominal tenderness. There is no guarding or rebound.   Musculoskeletal:        General: Tenderness present. Normal range of motion.     Cervical back: Normal range of motion.  Lymphadenopathy:     Cervical: No cervical adenopathy.  Skin:    General: Skin is warm and dry.     Findings: No rash.  Neurological:     Mental Status: He is alert and oriented to person, place, and time.     Cranial Nerves: No cranial nerve deficit.     Motor: No abnormal muscle tone.     Coordination: Coordination normal.     Gait: Gait abnormal.     Deep Tendon Reflexes: Reflexes are normal and symmetric.  Psychiatric:        Behavior: Behavior normal.        Thought Content: Thought content normal.        Judgment: Judgment normal.  LS and knees w/pain Sebaceous cysts on skin  Lab Results  Component Value Date   WBC 6.8 12/09/2021   HGB 15.4 12/09/2021   HCT 45.2 12/09/2021   PLT 259 12/09/2021   GLUCOSE 116 (H) 12/09/2021   CHOL 218 (H) 12/09/2021   TRIG 130 12/09/2021   HDL 46 12/09/2021   LDLDIRECT 110 (H) 10/07/2019   LDLCALC 149 (H) 12/09/2021   ALT 27 12/09/2021   AST 23 12/09/2021   NA 143 12/09/2021   K 4.6 12/09/2021   CL 105 12/09/2021   CREATININE 0.87 12/09/2021   BUN 16 12/09/2021   CO2 23 12/09/2021   TSH 3.590 12/09/2021   PSA 1.18 07/28/2020   INR 1.0 08/29/2017   HGBA1C 5.7 (H) 01/08/2021    No results found.  Assessment & Plan:   Problem List Items Addressed This Visit     Dyslipidemia    Off statin Taking very little Zetia F/u w/Dr Revankar.      GERD (gastroesophageal reflux disease)    Hold Meloxicam if worse      KNEE PAIN    Cont on Tramadol  Potential benefits of a long term opioids use as well as potential risks (i.e. addiction risk, apnea etc) and complications (i.e. Somnolence, constipation and others) were explained to the patient and were aknowledged.      Low back pain    Cont on Tramadol  Potential benefits of a long term opioids use as well as potential risks (i.e. addiction risk, apnea  etc) and complications (i.e. Somnolence, constipation and others) were explained to the patient and were aknowledged.         No orders of the defined types were placed in  this encounter.     Follow-up: Return in about 4 months (around 04/12/2022) for a follow-up visit.  Walker Kehr, MD

## 2021-12-13 NOTE — Assessment & Plan Note (Signed)
Off statin Taking very little Zetia F/u w/Dr Revankar.

## 2022-01-03 ENCOUNTER — Other Ambulatory Visit: Payer: Self-pay | Admitting: Internal Medicine

## 2022-01-04 DIAGNOSIS — L570 Actinic keratosis: Secondary | ICD-10-CM | POA: Diagnosis not present

## 2022-01-04 DIAGNOSIS — D485 Neoplasm of uncertain behavior of skin: Secondary | ICD-10-CM | POA: Diagnosis not present

## 2022-01-04 DIAGNOSIS — D2272 Melanocytic nevi of left lower limb, including hip: Secondary | ICD-10-CM | POA: Diagnosis not present

## 2022-01-04 DIAGNOSIS — D225 Melanocytic nevi of trunk: Secondary | ICD-10-CM | POA: Diagnosis not present

## 2022-01-04 DIAGNOSIS — L821 Other seborrheic keratosis: Secondary | ICD-10-CM | POA: Diagnosis not present

## 2022-01-04 DIAGNOSIS — D2271 Melanocytic nevi of right lower limb, including hip: Secondary | ICD-10-CM | POA: Diagnosis not present

## 2022-01-04 DIAGNOSIS — L720 Epidermal cyst: Secondary | ICD-10-CM | POA: Diagnosis not present

## 2022-01-14 ENCOUNTER — Other Ambulatory Visit: Payer: Self-pay | Admitting: Internal Medicine

## 2022-01-19 ENCOUNTER — Other Ambulatory Visit: Payer: Self-pay | Admitting: Internal Medicine

## 2022-01-25 ENCOUNTER — Other Ambulatory Visit: Payer: Self-pay

## 2022-01-25 MED ORDER — TRAMADOL HCL 50 MG PO TABS: ORAL_TABLET | ORAL | 1 refills | Status: DC

## 2022-02-07 ENCOUNTER — Other Ambulatory Visit: Payer: Self-pay | Admitting: Internal Medicine

## 2022-03-03 ENCOUNTER — Other Ambulatory Visit: Payer: Self-pay | Admitting: Internal Medicine

## 2022-03-09 ENCOUNTER — Other Ambulatory Visit: Payer: Self-pay | Admitting: Internal Medicine

## 2022-03-18 ENCOUNTER — Encounter: Payer: Self-pay | Admitting: Internal Medicine

## 2022-03-28 ENCOUNTER — Other Ambulatory Visit: Payer: Self-pay | Admitting: Internal Medicine

## 2022-03-29 ENCOUNTER — Other Ambulatory Visit: Payer: Self-pay | Admitting: Internal Medicine

## 2022-04-04 ENCOUNTER — Telehealth: Payer: Self-pay | Admitting: Internal Medicine

## 2022-04-04 NOTE — Telephone Encounter (Signed)
1.Medication Requested: traMADol (ULTRAM) 50 MG tablet 2. Pharmacy (Name, Street, [place]): [place], [place] Phone:  [phone]  Fax:  [phone]     3. On Med List: yes   4. Last Visit with PCP:  5. Next visit date with PCP:  States he has been out for a couple days now.    Agent: Please be advised that RX refills may take up to 3 business days. We ask that you follow-up with your pharmacy.

## 2022-04-05 NOTE — Telephone Encounter (Signed)
Pt called in and is needing to have this refill sent to pharmacy.   Pt was irritable and states that he needs to speak with someone because it is not being sent when it should.

## 2022-04-06 MED ORDER — TRAMADOL HCL 50 MG PO TABS: ORAL_TABLET | ORAL | 1 refills | Status: DC

## 2022-04-06 NOTE — Telephone Encounter (Signed)
Done. Thx.

## 2022-04-14 ENCOUNTER — Other Ambulatory Visit: Payer: Self-pay | Admitting: *Deleted

## 2022-04-14 MED ORDER — PANTOPRAZOLE SODIUM 40 MG PO TBEC: 40.0000 mg | DELAYED_RELEASE_TABLET | Freq: Two times a day (BID) | ORAL | 2 refills | Status: DC

## 2022-04-18 ENCOUNTER — Ambulatory Visit (INDEPENDENT_AMBULATORY_CARE_PROVIDER_SITE_OTHER): Payer: Medicare HMO | Admitting: Internal Medicine

## 2022-04-18 ENCOUNTER — Encounter: Payer: Self-pay | Admitting: Internal Medicine

## 2022-04-18 DIAGNOSIS — R5382 Chronic fatigue, unspecified: Secondary | ICD-10-CM

## 2022-04-18 DIAGNOSIS — G4700 Insomnia, unspecified: Secondary | ICD-10-CM

## 2022-04-18 DIAGNOSIS — E785 Hyperlipidemia, unspecified: Secondary | ICD-10-CM | POA: Diagnosis not present

## 2022-04-18 DIAGNOSIS — M5440 Lumbago with sciatica, unspecified side: Secondary | ICD-10-CM | POA: Diagnosis not present

## 2022-04-18 DIAGNOSIS — F419 Anxiety disorder, unspecified: Secondary | ICD-10-CM | POA: Diagnosis not present

## 2022-04-18 DIAGNOSIS — G4701 Insomnia due to medical condition: Secondary | ICD-10-CM

## 2022-04-18 HISTORY — DX: Insomnia, unspecified: G47.00

## 2022-04-18 MED ORDER — CYCLOBENZAPRINE HCL 5 MG PO TABS: 5.0000 mg | ORAL_TABLET | Freq: Every evening | ORAL | 3 refills | Status: DC | PRN

## 2022-04-18 NOTE — Progress Notes (Signed)
Subjective:  Patient ID: [name], male    DOB: [birth year]  Age: 71 y.o. MRN: [id number]  CC: No chief complaint on file.   HPI [name] presents for OA, severe B hoip and knee pain - can't sleep.... C/o LBP F/u GERD, CAD  Outpatient Medications Prior to Visit  Medication Sig Dispense Refill   acetaminophen (TYLENOL) 500 MG tablet Take 500 mg by mouth every 6 (six) hours as needed for headache.     ALPRAZolam (XANAX) 0.5 MG tablet Take 0.5 mg by mouth 2 (two) times daily as needed for anxiety.     aspirin 81 MG tablet Take 81 mg by mouth at bedtime.     Bempedoic Acid (NEXLETOL) 180 MG TABS Take by mouth.     Cyanocobalamin (VITAMIN B-12) 2500 MCG SUBL Place 5,000 mcg under the tongue daily.     escitalopram (LEXAPRO) 10 MG tablet Take 1 tablet by mouth once daily 90 tablet 2   ezetimibe (ZETIA) 10 MG tablet Take 0.5 tablets (5 mg total) by mouth daily. 45 tablet 3   fluticasone (FLONASE) 50 MCG/ACT nasal spray Use 1 spray(s) in each nostril once daily 16 g 3   loratadine (EQ LORATADINE) 10 MG tablet TAKE 1 TABLET BY MOUTH ONCE DAILY AS NEEDED FOR ALLERGIES 90 tablet 3   meloxicam (MOBIC) 7.5 MG tablet Take 2 tablets by mouth once daily 180 tablet 0   Multiple Vitamin (MULTIVITAMIN WITH MINERALS) TABS tablet Take 1 tablet by mouth daily.     nitroGLYCERIN (NITROSTAT) 0.4 MG SL tablet Place 1 tablet (0.4 mg total) under the tongue every 5 (five) minutes as needed for chest pain. 25 tablet 6   OVER THE COUNTER MEDICATION Take 3 capsules by mouth every morning. Flexnow     pantoprazole (PROTONIX) 40 MG tablet Take 1 tablet (40 mg total) by mouth 2 (two) times daily. 180 tablet 2   traMADol (ULTRAM) 50 MG tablet TAKE 1 TO 2 TABLETS BY MOUTH THREE TIMES DAILY AS NEEDED 180 tablet 1   valACYclovir (VALTREX) 500 MG tablet Take 1 tablet by mouth once daily 90 tablet 0   No facility-administered medications prior to visit.    ROS: Review of Systems  Constitutional:  Positive  for fatigue. Negative for appetite change and unexpected weight change.  HENT:  Negative for congestion, nosebleeds, sneezing, sore throat and trouble swallowing.   Eyes:  Negative for itching and visual disturbance.  Respiratory:  Negative for cough.   Cardiovascular:  Negative for chest pain, palpitations and leg swelling.  Gastrointestinal:  Negative for abdominal distention, blood in stool, diarrhea and nausea.  Genitourinary:  Negative for frequency and hematuria.  Musculoskeletal:  Positive for arthralgias, back pain and gait problem. Negative for joint swelling and neck pain.  Skin:  Negative for rash.  Neurological:  Negative for dizziness, tremors, speech difficulty and weakness.  Psychiatric/Behavioral:  Negative for agitation, dysphoric mood and sleep disturbance. The patient is not nervous/anxious.    Objective:  BP 118/82 (BP Location: Left Arm, Patient Position: Sitting, Cuff Size: Large)   Pulse 69   Temp 98.3 F (36.8 C) (Oral)   Ht 6' (1.829 m)   Wt 229 lb (103.9 kg)   SpO2 96%   BMI 31.06 kg/m   BP Readings from Last 3 Encounters:  04/18/22 118/82  12/13/21 122/70  08/24/21 (!) 152/86    Wt Readings from Last 3 Encounters:  04/18/22 229 lb (103.9 kg)  12/13/21 226 lb 3.2 oz (  102.6 kg)  08/24/21 234 lb (106.1 kg)    Physical Exam Constitutional:      General: He is not in acute distress.    Appearance: He is well-developed.     Comments: NAD  Eyes:     Conjunctiva/sclera: Conjunctivae normal.     Pupils: Pupils are equal, round, and reactive to light.  Neck:     Thyroid: No thyromegaly.     Vascular: No JVD.  Cardiovascular:     Rate and Rhythm: Normal rate and regular rhythm.     Heart sounds: Normal heart sounds. No murmur heard.   No friction rub. No gallop.  Pulmonary:     Effort: Pulmonary effort is normal. No respiratory distress.     Breath sounds: Normal breath sounds. No wheezing or rales.  Chest:     Chest wall: No tenderness.   Abdominal:     General: Bowel sounds are normal. There is no distension.     Palpations: Abdomen is soft. There is no mass.     Tenderness: There is no abdominal tenderness. There is no guarding or rebound.  Musculoskeletal:        General: No tenderness. Normal range of motion.     Cervical back: Normal range of motion.  Lymphadenopathy:     Cervical: No cervical adenopathy.  Skin:    General: Skin is warm and dry.     Findings: No rash.  Neurological:     Mental Status: He is alert and oriented to person, place, and time.     Cranial Nerves: No cranial nerve deficit.     Motor: No abnormal muscle tone.     Coordination: Coordination normal.     Gait: Gait normal.     Deep Tendon Reflexes: Reflexes are normal and symmetric.  Psychiatric:        Behavior: Behavior normal.        Thought Content: Thought content normal.        Judgment: Judgment normal.   B hips, knees w/pain LS w/pain   Lab Results  Component Value Date   WBC 6.8 12/09/2021   HGB 15.4 12/09/2021   HCT 45.2 12/09/2021   PLT 259 12/09/2021   GLUCOSE 116 (H) 12/09/2021   CHOL 218 (H) 12/09/2021   TRIG 130 12/09/2021   HDL 46 12/09/2021   LDLDIRECT 110 (H) 10/07/2019   LDLCALC 149 (H) 12/09/2021   ALT 27 12/09/2021   AST 23 12/09/2021   NA 143 12/09/2021   K 4.6 12/09/2021   CL 105 12/09/2021   CREATININE 0.87 12/09/2021   BUN 16 12/09/2021   CO2 23 12/09/2021   TSH 3.590 12/09/2021   PSA 1.18 07/28/2020   INR 1.0 08/29/2017   HGBA1C 5.7 (H) 01/08/2021    No results found.  Assessment & Plan:   Problem List Items Addressed This Visit     Dyslipidemia    Cont on Zetia       Relevant Medications   Bempedoic Acid (NEXLETOL) 180 MG TABS   Fatigue    Will treat insomnia       Insomnia    Worse due to pain. Try Flexeril at hs.      Low back pain    Worse. Other options discussed. Try Flexeril at hs. Cont on Tramadol  Potential benefits of a long term opioids use as well as  potential risks (i.e. addiction risk, apnea etc) and complications (i.e. Somnolence, constipation and others) were explained to the patient and  were aknowledged. Blue-Emu cream was recommended to use 2-3 times a day       Relevant Medications   cyclobenzaprine (FLEXERIL) 5 MG tablet      Meds ordered this encounter  Medications   cyclobenzaprine (FLEXERIL) 5 MG tablet    Sig: Take 1-2 tablets (5-10 mg total) by mouth at bedtime as needed.    Dispense:  60 tablet    Refill:  3      Follow-up: No follow-ups on file.  Walker Kehr, MD

## 2022-04-18 NOTE — Assessment & Plan Note (Signed)
Cont on Zetia

## 2022-04-18 NOTE — Assessment & Plan Note (Signed)
Worse due to pain. Try Flexeril at hs.

## 2022-04-18 NOTE — Assessment & Plan Note (Signed)
Worse. Other options discussed. Try Flexeril at hs. Cont on Tramadol  Potential benefits of a long term opioids use as well as potential risks (i.e. addiction risk, apnea etc) and complications (i.e. Somnolence, constipation and others) were explained to the patient and were aknowledged. Blue-Emu cream was recommended to use 2-3 times a day

## 2022-04-18 NOTE — Assessment & Plan Note (Signed)
Cont on Lexapro 

## 2022-04-18 NOTE — Assessment & Plan Note (Signed)
Will treat insomnia

## 2022-04-18 NOTE — Patient Instructions (Signed)
Blue-Emu cream -- use 2-3 times a day ? ?

## 2022-04-19 ENCOUNTER — Other Ambulatory Visit: Payer: Self-pay | Admitting: *Deleted

## 2022-04-19 MED ORDER — VALACYCLOVIR HCL 500 MG PO TABS: 500.0000 mg | ORAL_TABLET | Freq: Every day | ORAL | 1 refills | Status: DC

## 2022-05-09 ENCOUNTER — Other Ambulatory Visit: Payer: Self-pay | Admitting: Internal Medicine

## 2022-05-24 ENCOUNTER — Ambulatory Visit (INDEPENDENT_AMBULATORY_CARE_PROVIDER_SITE_OTHER): Payer: Medicare HMO | Admitting: Internal Medicine

## 2022-05-24 ENCOUNTER — Ambulatory Visit: Payer: Medicare HMO | Admitting: Cardiology

## 2022-05-24 ENCOUNTER — Encounter: Payer: Self-pay | Admitting: Internal Medicine

## 2022-05-24 DIAGNOSIS — K429 Umbilical hernia without obstruction or gangrene: Secondary | ICD-10-CM

## 2022-05-24 HISTORY — DX: Umbilical hernia without obstruction or gangrene: K42.9

## 2022-05-24 NOTE — Progress Notes (Signed)
Subjective:  Patient ID: [name], male    DOB: July 06, 1951  Age: 71 y.o. MRN: [id number]  CC: No chief complaint on file.   HPI [name] presents for hernia came back  Outpatient Medications Prior to Visit  Medication Sig Dispense Refill   acetaminophen (TYLENOL) 500 MG tablet Take 500 mg by mouth every 6 (six) hours as needed for headache.     ALPRAZolam (XANAX) 0.5 MG tablet Take 0.5 mg by mouth 2 (two) times daily as needed for anxiety.     aspirin 81 MG tablet Take 81 mg by mouth at bedtime.     Bempedoic Acid (NEXLETOL) 180 MG TABS Take by mouth.     Cyanocobalamin (VITAMIN B-12) 2500 MCG SUBL Place 5,000 mcg under the tongue daily.     cyclobenzaprine (FLEXERIL) 5 MG tablet Take 1-2 tablets (5-10 mg total) by mouth at bedtime as needed. 60 tablet 3   escitalopram (LEXAPRO) 10 MG tablet Take 1 tablet by mouth once daily 90 tablet 2   ezetimibe (ZETIA) 10 MG tablet Take 0.5 tablets (5 mg total) by mouth daily. 45 tablet 3   fluticasone (FLONASE) 50 MCG/ACT nasal spray Use 1 spray(s) in each nostril once daily 16 g 3   loratadine (EQ LORATADINE) 10 MG tablet TAKE 1 TABLET BY MOUTH ONCE DAILY AS NEEDED FOR ALLERGIES 90 tablet 3   meloxicam (MOBIC) 7.5 MG tablet Take 2 tablets by mouth once daily 180 tablet 0   Multiple Vitamin (MULTIVITAMIN WITH MINERALS) TABS tablet Take 1 tablet by mouth daily.     nitroGLYCERIN (NITROSTAT) 0.4 MG SL tablet Place 1 tablet (0.4 mg total) under the tongue every 5 (five) minutes as needed for chest pain. 25 tablet 6   OVER THE COUNTER MEDICATION Take 3 capsules by mouth every morning. Flexnow     pantoprazole (PROTONIX) 40 MG tablet Take 1 tablet by mouth twice daily 180 tablet 1   traMADol (ULTRAM) 50 MG tablet TAKE 1 TO 2 TABLETS BY MOUTH THREE TIMES DAILY AS NEEDED 180 tablet 1   valACYclovir (VALTREX) 500 MG tablet Take 1 tablet (500 mg total) by mouth daily. 90 tablet 1   No facility-administered medications prior to visit.     ROS: Review of Systems  Constitutional:  Negative for appetite change, fatigue and unexpected weight change.  HENT:  Negative for congestion, nosebleeds, sneezing, sore throat and trouble swallowing.   Eyes:  Negative for itching and visual disturbance.  Respiratory:  Negative for cough.   Cardiovascular:  Negative for chest pain, palpitations and leg swelling.  Gastrointestinal:  Negative for abdominal distention, blood in stool, diarrhea and nausea.  Genitourinary:  Negative for frequency and hematuria.  Musculoskeletal:  Negative for back pain, gait problem, joint swelling and neck pain.  Skin:  Negative for rash.  Neurological:  Negative for dizziness, tremors, speech difficulty and weakness.  Psychiatric/Behavioral:  Negative for agitation, dysphoric mood and sleep disturbance. The patient is not nervous/anxious.     Objective:  BP 130/70 (BP Location: Left Arm, Patient Position: Sitting, Cuff Size: Normal)   Pulse 70   Temp 99.2 F (37.3 C) (Oral)   Ht 6' (1.829 m)   Wt 226 lb (102.5 kg)   SpO2 95%   BMI 30.65 kg/m   BP Readings from Last 3 Encounters:  05/24/22 130/70  04/18/22 118/82  12/13/21 122/70    Wt Readings from Last 3 Encounters:  05/24/22 226 lb (102.5 kg)  04/18/22 229 lb (103.9  kg)  12/13/21 226 lb 3.2 oz (102.6 kg)    Physical Exam Constitutional:      General: He is not in acute distress.    Appearance: He is well-developed. He is obese.     Comments: NAD  Eyes:     Conjunctiva/sclera: Conjunctivae normal.     Pupils: Pupils are equal, round, and reactive to light.  Neck:     Thyroid: No thyromegaly.     Vascular: No JVD.  Cardiovascular:     Rate and Rhythm: Normal rate and regular rhythm.     Heart sounds: Normal heart sounds. No murmur heard.    No friction rub. No gallop.  Pulmonary:     Effort: Pulmonary effort is normal. No respiratory distress.     Breath sounds: Normal breath sounds. No wheezing or rales.  Chest:     Chest  wall: No tenderness.  Abdominal:     General: Bowel sounds are normal. There is no distension.     Palpations: Abdomen is soft. There is no mass.     Tenderness: There is no abdominal tenderness. There is no guarding or rebound.  Musculoskeletal:        General: No tenderness. Normal range of motion.     Cervical back: Normal range of motion.  Lymphadenopathy:     Cervical: No cervical adenopathy.  Skin:    General: Skin is warm and dry.     Findings: No rash.  Neurological:     Mental Status: He is alert and oriented to person, place, and time.     Cranial Nerves: No cranial nerve deficit.     Motor: No abnormal muscle tone.     Coordination: Coordination normal.     Gait: Gait normal.     Deep Tendon Reflexes: Reflexes are normal and symmetric.  Psychiatric:        Behavior: Behavior normal.        Thought Content: Thought content normal.        Judgment: Judgment normal.   Mid abd hernia, sensitive  Lab Results  Component Value Date   WBC 6.8 12/09/2021   HGB 15.4 12/09/2021   HCT 45.2 12/09/2021   PLT 259 12/09/2021   GLUCOSE 116 (H) 12/09/2021   CHOL 218 (H) 12/09/2021   TRIG 130 12/09/2021   HDL 46 12/09/2021   LDLDIRECT 110 (H) 10/07/2019   LDLCALC 149 (H) 12/09/2021   ALT 27 12/09/2021   AST 23 12/09/2021   NA 143 12/09/2021   K 4.6 12/09/2021   CL 105 12/09/2021   CREATININE 0.87 12/09/2021   BUN 16 12/09/2021   CO2 23 12/09/2021   TSH 3.590 12/09/2021   PSA 1.18 07/28/2020   INR 1.0 08/29/2017   HGBA1C 5.7 (H) 01/08/2021    No results found.  Assessment & Plan:   Problem List Items Addressed This Visit     Umbilical hernia    Relapsed Surgical ref Dr Redmond Pulling      Relevant Orders   Ambulatory referral to General Surgery      No orders of the defined types were placed in this encounter.     Follow-up: Return in about 3 months (around 08/24/2022) for a follow-up visit.  Walker Kehr, MD

## 2022-05-24 NOTE — Assessment & Plan Note (Addendum)
Relapsed Surgical ref Dr Redmond Pulling

## 2022-06-08 ENCOUNTER — Other Ambulatory Visit: Payer: Self-pay | Admitting: Internal Medicine

## 2022-06-08 NOTE — Telephone Encounter (Signed)
Check [place] registry last filled 05/09/2022. Md out of the office until 06/13/22. Pls advise on refill.Marland KitchenJohny Chess

## 2022-06-13 ENCOUNTER — Ambulatory Visit: Payer: Medicare HMO | Admitting: Cardiology

## 2022-07-04 ENCOUNTER — Other Ambulatory Visit: Payer: Self-pay | Admitting: Internal Medicine

## 2022-07-12 ENCOUNTER — Other Ambulatory Visit: Payer: Self-pay

## 2022-07-13 ENCOUNTER — Ambulatory Visit: Payer: Medicare HMO | Attending: Cardiology | Admitting: Cardiology

## 2022-07-13 ENCOUNTER — Encounter: Payer: Self-pay | Admitting: Cardiology

## 2022-07-13 VITALS — BP 118/68 | HR 73 | Ht 72.0 in | Wt 230.1 lb

## 2022-07-13 DIAGNOSIS — E669 Obesity, unspecified: Secondary | ICD-10-CM | POA: Diagnosis not present

## 2022-07-13 DIAGNOSIS — I2510 Atherosclerotic heart disease of native coronary artery without angina pectoris: Secondary | ICD-10-CM | POA: Diagnosis not present

## 2022-07-13 DIAGNOSIS — E785 Hyperlipidemia, unspecified: Secondary | ICD-10-CM

## 2022-07-13 DIAGNOSIS — I10 Essential (primary) hypertension: Secondary | ICD-10-CM | POA: Diagnosis not present

## 2022-07-13 DIAGNOSIS — R739 Hyperglycemia, unspecified: Secondary | ICD-10-CM

## 2022-07-13 DIAGNOSIS — Z1321 Encounter for screening for nutritional disorder: Secondary | ICD-10-CM | POA: Diagnosis not present

## 2022-07-13 NOTE — Patient Instructions (Signed)
Medication Instructions:  Your physician recommends that you continue on your current medications as directed. Please refer to the Current Medication list given to you today.  *If you need a refill on your cardiac medications before your next appointment, please call your pharmacy*   Lab Work: Your physician recommends that you have labs done in the office today. Your test included  basic metabolic panel, complete blood count, TSH, A1C, vitamin D, liver function and lipids.  [address] in [place]. They also close daily for lunch for 12-1.   [address] [address] 2nd floor M-W 8-11:30 and 1-4:30 and Thursday and Friday 8-11:30.  If you have labs (blood work) drawn today and your tests are completely normal, you will receive your results only by: [place] (if you have MyChart) OR A paper copy in the mail If you have any lab test that is abnormal or we need to change your treatment, we will call you to review the results.   Testing/Procedures: Your physician has requested that you have an echocardiogram. Echocardiography is a painless test that uses sound waves to create images of your heart. It provides your doctor with information about the size and shape of your heart and how well your heart's chambers and valves are working. This procedure takes approximately one hour. There are no restrictions for this procedure.    Follow-Up: At [facility], you and your health needs are our priority.  As part of our continuing mission to provide you with exceptional heart care, we have created designated Provider Care Teams.  These Care Teams include your primary Cardiologist (physician) and Advanced Practice Providers (APPs -  Physician Assistants and Nurse Practitioners) who all work together to provide you with the care you need, when you need it.  We recommend signing up for the patient portal called "MyChart".  Sign up information is provided on this  After Visit Summary.  MyChart is used to connect with patients for Virtual Visits (Telemedicine).  Patients are able to view lab/test results, encounter notes, upcoming appointments, etc.  Non-urgent messages can be sent to your provider as well.   To learn more about what you can do with MyChart, go to [email or web address].    Your next appointment:   12 month(s)  The format for your next appointment:   In Person  Provider:   Jyl Heinz, MD   Other Instructions Echocardiogram An echocardiogram is a test that uses sound waves (ultrasound) to produce images of the heart. Images from an echocardiogram can provide important information about: Heart size and shape. The size and thickness and movement of your heart's walls. Heart muscle function and strength. Heart valve function or if you have stenosis. Stenosis is when the heart valves are too narrow. If blood is flowing backward through the heart valves (regurgitation). A tumor or infectious growth around the heart valves. Areas of heart muscle that are not working well because of poor blood flow or injury from a heart attack. Aneurysm detection. An aneurysm is a weak or damaged part of an artery wall. The wall bulges out from the normal force of blood pumping through the body. Tell a health care provider about: Any allergies you have. All medicines you are taking, including vitamins, herbs, eye drops, creams, and over-the-counter medicines. Any blood disorders you have. Any surgeries you have had. Any medical conditions you have. Whether you are pregnant or may be pregnant. What are the risks? Generally, this is a safe  test. However, problems may occur, including an allergic reaction to dye (contrast) that may be used during the test. What happens before the test? No specific preparation is needed. You may eat and drink normally. What happens during the test? You will take off your clothes from the waist up and put on a  hospital gown. Electrodes or electrocardiogram (ECG)patches may be placed on your chest. The electrodes or patches are then connected to a device that monitors your heart rate and rhythm. You will lie down on a table for an ultrasound exam. A gel will be applied to your chest to help sound waves pass through your skin. A handheld device, called a transducer, will be pressed against your chest and moved over your heart. The transducer produces sound waves that travel to your heart and bounce back (or "echo" back) to the transducer. These sound waves will be captured in real-time and changed into images of your heart that can be viewed on a video monitor. The images will be recorded on a computer and reviewed by your health care provider. You may be asked to change positions or hold your breath for a short time. This makes it easier to get different views or better views of your heart. In some cases, you may receive contrast through an IV in one of your veins. This can improve the quality of the pictures from your heart. The procedure may vary among health care providers and hospitals.   What can I expect after the test? You may return to your normal, everyday life, including diet, activities, and medicines, unless your health care provider tells you not to do that. Follow these instructions at home: It is up to you to get the results of your test. Ask your health care provider, or the department that is doing the test, when your results will be ready. Keep all follow-up visits. This is important. Summary An echocardiogram is a test that uses sound waves (ultrasound) to produce images of the heart. Images from an echocardiogram can provide important information about the size and shape of your heart, heart muscle function, heart valve function, and other possible heart problems. You do not need to do anything to prepare before this test. You may eat and drink normally. After the echocardiogram is  completed, you may return to your normal, everyday life, unless your health care provider tells you not to do that. This information is not intended to replace advice given to you by your health care provider. Make sure you discuss any questions you have with your health care provider. Document Revised: 06/23/2020 Document Reviewed: 06/23/2020 Elsevier Patient Education  2021 [employer].

## 2022-07-13 NOTE — Progress Notes (Signed)
Cardiology Office Note:    Date:  07/13/2022   ID:  [name], DOB [birth year], MRN [id number]  PCP:  Cassandria Anger, MD  Cardiologist:  Jenean Lindau, MD   Referring MD: Cassandria Anger, MD    ASSESSMENT:    1. Coronary artery disease involving native coronary artery of native heart without angina pectoris   2. Essential hypertension   3. Dyslipidemia   4. Obesity (BMI 30.0-34.9)   5. Encounter for vitamin deficiency screening   6. Hyperglycemia    PLAN:    In order of problems listed above:  Coronary artery disease: Secondary prevention stressed with the patient.  Importance of compliance with diet and medication stressed and he vocalized understanding.  He cannot ambulate much because of orthopedic issues. Essential hypertension: Blood pressure stable and diet was emphasized. Mixed dyslipidemia and obesity: I discussed with him at extensive length.  We will try to see patient qualify for PCSK9.  We will send a message to the lipid clinic to get in touch with him.  Meanwhile he will continue his Nexletol and Zetia.  He will have complete blood work today. We will do hemoglobin A1c.  He has history of vitamin D deficiency.  We will check this also. Obesity: Weight reduction stressed diet was emphasized importance of stopping and he plans to do better. Patient will be seen in follow-up appointment in 9 months or earlier if the patient has any concerns    Medication Adjustments/Labs and Tests Ordered: Current medicines are reviewed at length with the patient today.  Concerns regarding medicines are outlined above.  Orders Placed This Encounter  Procedures   Basic metabolic panel   CBC   Hemoglobin A1c   Hepatic function panel   Lipid panel   TSH   VITAMIN D 25 Hydroxy (Vit-D Deficiency, Fractures)   EKG 12-Lead   ECHOCARDIOGRAM COMPLETE   No orders of the defined types were placed in this encounter.    No chief complaint on file.    History of  Present Illness:    [name] is a 71 y.o. male.  Patient has past medical history of coronary artery disease, essential hypertension, dyslipidemia and obesity.  He leads a sedentary lifestyle because of orthopedic issues.  He tells me that he cannot tolerate statins and now he cannot tolerate Nexletol and Zetia also.  He denies any chest pain orthopnea or PND.  He has been in touch with the lipid clinic in the past.  At the time of my evaluation, the patient is alert awake oriented and in no distress.  Past Medical History:  Diagnosis Date   Actinic keratosis    Acute sinusitis 06/21/2010   9/14, 10/19    Allergy    Anxiety 08/24/2017   Chronic  Worse in 10/18 Xanax prn - d/c  Potential benefits of a long term benzodiazepines  use as well as potential risks  and complications were explained to the patient and were aknowledged. 11/18 Lexapro     Arthralgia 04/14/2017   2018 2019 Rheum consult; Gluten free trial Dr Estanislado Pandy 12[facility] Depo-medrol IM Increase Tramadol w/caution  Potential benefits of a long term opioids use as well as potential risks (i.e. addiction risk, apnea etc) and complications (i.e. Somnolence, constipation and others) were explained to the patient and were aknowledged.   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 Pravastatin - pt stopped due to side effects 1/21 Zetia - causing pain too Krill oil d/c.  Repatha option discussed.  9/21 Vascepa, Zetia   Chest pain  12/07/2020   Chondrocalcinosis 07/06/2018   Chronic sinusitis 08/12/2013    Sinus congestion, SOB - better after surgery - nov 2014 9/14 worse CT IMPRESSION:  Minimal sinus mucosal thickening, primarily in the frontal recesses.  Small right maxillary mucous retention cyst. No CT evidence of acute  sinusitis.  Electronically Signed  By: Lars Pinks M.D.  On: 08/15/2013 09:19  Doxy x 4 wks   Colonic polyp    COLONIC POLYPS, HX OF    CONTACT DERMATITIS DUE TO SOLVENTS    DDD (degenerative disc disease), cervical 07/06/2018   DDD (degenerative disc disease), lumbar 07/06/2018   and facet joint arthropathy   Dizzinesses 07/18/2017   9/18 I suggested a brain MRI to r/o acustic neuroma etc: he will think about it and check on cost ENT ref suggested   DOE (dyspnea on exertion) 07/25/2012   related to eating shrimp   Dyslipidemia 06/21/2013   Chronic  Elev TG, low HDL, nl LDL Pravastatin - pt stopped due to side effects 1/21 Repatha option discussed. On Zetia - c/o side effeects 9/21 Vascepa, Zetia   Dyspnea 07/25/2012   related to eating shrimp   Essential hypertension 07/29/2021   Fatigue 06/09/2015   2016 no OSA  2018 much better (60%) off Reglan.    Folliculitis 60/73/7106   GERD 05/09/2008   Chronic  -- Protonix 5/18 We discussed his GERD/Barrett's treated at [facility] (Dr Ferdinand Lango). Reglan was d/c'd due to CP. He was dx'd w/pancreatitis and started on Zenpep He had an abd CT, [place], EGD and colonoscopy. 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 Try Flexeril at hs.   KNEE  PAIN    Laryngopharyngeal reflux (LPR) 10/02/2018   Localized swelling, mass or lump of neck 09/05/2018   Low back pain 06/09/2015   Per Dr Alvan Dame 3/17 - planning to see Dr Annette Stable Body pains are much better (60%) off Reglan. Off Tramadol 2019 Rheum consult; Gluten free trial Tramadol  Potential benefits of a long term opioids use as well as potential risks (i.e. addiction risk, apnea etc) and complications (i.e. Somnolence, constipation and others) were explained to the patient and were aknowledged.   Nausea 07/18/2017   Chronic w/dizziness I suggested a brain MRI to r/o acustic neuroma etc: he will think about it and check on cost 9/18  Neoplasm of uncertain behavior of skin    OA (osteoarthritis) of knee    Obesity (BMI 30.0-34.9) 01/07/2021   ONYCHOMYCOSIS    OSA (obstructive sleep apnea) 08/21/2013   2014 no need for CPAP    Preop exam for internal medicine    Primary osteoarthritis of both hands 11/21/2008   Chronic and severe Tramadol  Potential benefits of a long term opioids use as well as potential risks (i.e. addiction risk, apnea etc) and complications (i.e. Somnolence, constipation and others) were explained to the patient and were aknowledged.   10/18 Body pains are much better (60%) off Reglan. 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 Xanax prn - d/c  Potential benefits of a long term benzodiazepines  use as well as  potential risks  and complications were explained to the patient and were aknowledged. Neurol ref - Dr Tat:   "Hi   Umbilical hernia 4/94/4967   Relapsed Surgical ref   Upper respiratory infection 07/27/2018   9/19, 12/21   Well adult exam     Past Surgical History:  Procedure Laterality Date   COLONOSCOPY  04/24/2019   JOINT REPLACEMENT  9/13   L TKR   KNEE ARTHROSCOPY     Left   LEFT HEART CATH AND CORONARY ANGIOGRAPHY N/A 08/31/2017   Procedure: LEFT HEART CATH AND CORONARY ANGIOGRAPHY;  Surgeon: Nelva Bush, MD;  Location: [facility];  Service: Cardiovascular;  Laterality: N/A;   TOTAL KNEE ARTHROPLASTY  07/31/2012   Procedure: TOTAL KNEE ARTHROPLASTY;  Surgeon: Sydnee Cabal, MD;  Location: [facility];  Service: Orthopedics;  Laterality: Left;   UMBILICAL HERNIA REPAIR  07-25-12   10 yrs ago   UPPER GASTROINTESTINAL ENDOSCOPY  04/24/2019    Current Medications: Current Meds  Medication Sig   acetaminophen (TYLENOL) 500 MG tablet Take 500 mg by mouth every 6 (six) hours as needed for headache.   ALPRAZolam (XANAX) 0.5 MG tablet Take 0.5 mg by mouth 2 (two) times daily as needed for anxiety.   aspirin 81 MG tablet Take 81 mg by mouth at bedtime.   Bempedoic Acid (NEXLETOL) 180 MG TABS Take 180 mg by mouth daily.   Cyanocobalamin (VITAMIN B-12) 2500 MCG SUBL Place 5,000 mcg under the tongue daily.   cyclobenzaprine (FLEXERIL) 5 MG tablet Take 1-2 tablets (5-10 mg total) by mouth at bedtime as needed.   escitalopram (LEXAPRO) 10 MG tablet Take 1 tablet by mouth once daily   ezetimibe (ZETIA) 10 MG tablet Take 0.5 tablets (5 mg total) by mouth daily.   fluticasone (FLONASE) 50 MCG/ACT nasal spray Use 1 spray(s) in each nostril once daily   loratadine (EQ LORATADINE) 10 MG tablet TAKE 1 TABLET BY MOUTH ONCE DAILY AS NEEDED FOR ALLERGIES   meloxicam (MOBIC) 7.5 MG tablet Take 2 tablets by mouth once daily   Multiple Vitamin (MULTIVITAMIN  WITH MINERALS) TABS tablet Take 1  tablet by mouth daily.   nitroGLYCERIN (NITROSTAT) 0.4 MG SL tablet Place 1 tablet (0.4 mg total) under the tongue every 5 (five) minutes as needed for chest pain.   OVER THE COUNTER MEDICATION Take 3 capsules by mouth every morning. Flexnow   pantoprazole (PROTONIX) 40 MG tablet Take 1 tablet by mouth twice daily   traMADol (ULTRAM) 50 MG tablet TAKE 1 TO 2 TABLETS BY MOUTH THREE TIMES DAILY AS NEEDED   valACYclovir (VALTREX) 500 MG tablet Take 1 tablet by mouth once daily     Allergies:   Hytrin [terazosin], Icosapent ethyl, Levaquin [levofloxacin in d5w], Lipitor [atorvastatin], Nexletol [bempedoic acid], Penicillins, Reglan [metoclopramide], and Xanax [alprazolam]   Social History   Socioeconomic History   Marital status: Married    Spouse name: Not on file   Number of children: 2   Years of education: Not on file   Highest education level: Not on file  Occupational History   Occupation: retired  Tobacco Use   Smoking status: Former    Types: Cigars    Quit date: 08/30/2007    Years since quitting: 14.8   Smokeless tobacco: Never   Tobacco comments:    occ. cigar  Vaping Use   Vaping Use: Never used  Substance and Sexual Activity   Alcohol use: Yes    Comment: occ.    Drug use: Yes    Types: Marijuana    Comment: last marijuana this am, 2 "hits" per patient   Sexual activity: Yes    Partners: Female    Birth control/protection: None  Other Topics Concern   Not on file  Social History Narrative   Not on file   Social Determinants of Health   Financial Resource Strain: Low Risk  (10/29/2021)   Overall Financial Resource Strain (CARDIA)    Difficulty of Paying Living Expenses: Not hard at all  Food Insecurity: No Food Insecurity (10/29/2021)   Hunger Vital Sign    Worried About Running Out of Food in the Last Year: Never true    [place] in the Last Year: Never true  Transportation Needs: No Transportation Needs (10/29/2021)   PRAPARE - Armed forces logistics/support/administrative officer (Medical): No    Lack of Transportation (Non-Medical): No  Physical Activity: Sufficiently Active (10/29/2021)   Exercise Vital Sign    Days of Exercise per Week: 5 days    Minutes of Exercise per Session: 30 min  Stress: No Stress Concern Present (10/29/2021)   [place]    Feeling of Stress : Not at all  Social Connections: Moderately Integrated (10/29/2021)   Social Connection and Isolation Panel [NHANES]    Frequency of Communication with Friends and Family: Twice a week    Frequency of Social Gatherings with Friends and Family: Twice a week    Attends Religious Services: 1 to 4 times per year    Active Member of [employer] or Organizations: No    Attends Music therapist: Never    Marital Status: Married     Family History: The patient's family history includes Colon cancer in his brother; Colon polyps in his brother; Coronary artery disease in an other family member; Diabetes (age of onset: 8) in his mother; Healthy in his daughter; Heart disease (age of onset: 46) in his father; Heart disease (age of onset: 72) in his brother; Lung cancer in his brother. There  is no history of Stomach cancer, Rectal cancer, or Esophageal cancer.  ROS:   Please see the history of present illness.    All other systems reviewed and are negative.  EKGs/Labs/Other Studies Reviewed:    The following studies were reviewed today: EKG reveals sinus rhythm and nonspecific ST-T changes   Recent Labs: 12/09/2021: ALT 27; BUN 16; Creatinine, Ser 0.87; Hemoglobin 15.4; Platelets 259; Potassium 4.6; Sodium 143; TSH 3.590  Recent Lipid Panel    Component Value Date/Time   CHOL 218 (H) 12/09/2021 0821   TRIG 130 12/09/2021 0821   HDL 46 12/09/2021 0821   CHOLHDL 4.7 12/09/2021 0821   CHOLHDL 5 07/28/2020 0840   VLDL 25.8 07/28/2020 0840   LDLCALC 149 (H) 12/09/2021 0821   LDLDIRECT 110 (H) 10/07/2019  0910   LDLDIRECT 151.0 04/25/2018 0942    Physical Exam:    VS:  BP 118/68   Pulse 73   Ht 6' (1.829 m)   Wt 230 lb 1.9 oz (104.4 kg)   SpO2 95%   BMI 31.21 kg/m     Wt Readings from Last 3 Encounters:  07/13/22 230 lb 1.9 oz (104.4 kg)  05/24/22 226 lb (102.5 kg)  04/18/22 229 lb (103.9 kg)     GEN: Patient is in no acute distress HEENT: Normal NECK: No JVD; No carotid bruits LYMPHATICS: No lymphadenopathy CARDIAC: Hear sounds regular, 2/6 systolic murmur at the apex. RESPIRATORY:  Clear to auscultation without rales, wheezing or rhonchi  ABDOMEN: Soft, non-tender, non-distended MUSCULOSKELETAL:  No edema; No deformity  SKIN: Warm and dry NEUROLOGIC:  Alert and oriented x 3 PSYCHIATRIC:  Normal affect   Signed, Jenean Lindau, MD  07/13/2022 9:44 AM    [place]

## 2022-07-14 LAB — CBC
Hematocrit: 43.3 % (ref 37.5–51.0)
Hemoglobin: 15 g/dL (ref 13.0–17.7)
MCH: 32.1 pg (ref 26.6–33.0)
MCHC: 34.6 g/dL (ref 31.5–35.7)
MCV: 93 fL (ref 79–97)
Platelets: 269 10*3/uL (ref 150–450)
RBC: 4.68 x10E6/uL (ref 4.14–5.80)
RDW: 12.5 % (ref 11.6–15.4)
WBC: 6.5 10*3/uL (ref 3.4–10.8)

## 2022-07-14 LAB — BASIC METABOLIC PANEL
BUN/Creatinine Ratio: 20 (ref 10–24)
BUN: 17 mg/dL (ref 8–27)
CO2: 24 mmol/L (ref 20–29)
Calcium: 10 mg/dL (ref 8.6–10.2)
Chloride: 101 mmol/L (ref 96–106)
Creatinine, Ser: 0.83 mg/dL (ref 0.76–1.27)
Glucose: 112 mg/dL — ABNORMAL HIGH (ref 70–99)
Potassium: 4.9 mmol/L (ref 3.5–5.2)
Sodium: 140 mmol/L (ref 134–144)
eGFR: 95 mL/min/{1.73_m2} (ref >59)

## 2022-07-14 LAB — HEPATIC FUNCTION PANEL
ALT: 24 IU/L (ref 0–44)
AST: 23 IU/L (ref 0–40)
Albumin: 4.9 g/dL (ref 3.9–4.9)
Alkaline Phosphatase: 65 IU/L (ref 44–121)
Bilirubin Total: 0.6 mg/dL (ref 0.0–1.2)
Bilirubin, Direct: 0.15 mg/dL (ref 0.00–0.40)
Total Protein: 7.8 g/dL (ref 6.0–8.5)

## 2022-07-14 LAB — HEMOGLOBIN A1C
Est. average glucose Bld gHb Est-mCnc: 126 mg/dL
Hgb A1c MFr Bld: 6 % — ABNORMAL HIGH (ref 4.8–5.6)

## 2022-07-14 LAB — VITAMIN D 25 HYDROXY (VIT D DEFICIENCY, FRACTURES): Vit D, 25-Hydroxy: 46.4 ng/mL (ref 30.0–100.0)

## 2022-07-14 LAB — TSH: TSH: 3.65 u[IU]/mL (ref 0.450–4.500)

## 2022-07-14 LAB — LIPID PANEL
Chol/HDL Ratio: 4.3 ratio (ref 0.0–5.0)
Cholesterol, Total: 191 mg/dL (ref 100–199)
HDL: 44 mg/dL (ref >39)
LDL Chol Calc (NIH): 107 mg/dL — ABNORMAL HIGH (ref 0–99)
Triglycerides: 231 mg/dL — ABNORMAL HIGH (ref 0–149)
VLDL Cholesterol Cal: 40 mg/dL (ref 5–40)

## 2022-07-19 ENCOUNTER — Ambulatory Visit (HOSPITAL_BASED_OUTPATIENT_CLINIC_OR_DEPARTMENT_OTHER)
Admission: RE | Admit: 2022-07-19 | Discharge: 2022-07-19 | Disposition: A | Discharge status: Home / Self Care | Payer: Medicare HMO | Source: Ambulatory Visit | Attending: Cardiology | Admitting: Cardiology

## 2022-07-19 DIAGNOSIS — I2510 Atherosclerotic heart disease of native coronary artery without angina pectoris: Secondary | ICD-10-CM

## 2022-07-19 LAB — ECHOCARDIOGRAM COMPLETE
AR max vel: 1.26 cm2
AV Area VTI: 1.21 cm2
AV Area mean vel: 1.1 cm2
AV Mean grad: 20 mmHg
AV Peak grad: 29.8 mmHg
Ao pk vel: 2.73 m/s
Area-P 1/2: 3.01 cm2
P 1/2 time: 833 msec
S' Lateral: 2.8 cm

## 2022-07-19 NOTE — Progress Notes (Signed)
  Echocardiogram 2D Echocardiogram has been performed.  Merrie Roof F 07/19/2022, 8:45 AM

## 2022-07-20 ENCOUNTER — Telehealth: Payer: Self-pay

## 2022-07-20 DIAGNOSIS — M6208 Separation of muscle (nontraumatic), other site: Secondary | ICD-10-CM | POA: Diagnosis not present

## 2022-07-20 DIAGNOSIS — I77810 Thoracic aortic ectasia: Secondary | ICD-10-CM

## 2022-07-20 DIAGNOSIS — E6609 Other obesity due to excess calories: Secondary | ICD-10-CM | POA: Diagnosis not present

## 2022-07-20 DIAGNOSIS — K429 Umbilical hernia without obstruction or gangrene: Secondary | ICD-10-CM | POA: Diagnosis not present

## 2022-07-20 NOTE — Telephone Encounter (Signed)
-----   Message from Jenean Lindau, MD sent at 07/19/2022  1:33 PM EDT ----- Mild to moderate aortic stenosis.  Medical management.  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  Copy primary care. Jenean Lindau, MD 07/19/2022 1:33 PM

## 2022-07-20 NOTE — Telephone Encounter (Signed)
Pt aware that he needs a chest CT. Pt verbalized understanding and had no additional questions.

## 2022-07-26 ENCOUNTER — Ambulatory Visit (INDEPENDENT_AMBULATORY_CARE_PROVIDER_SITE_OTHER): Payer: Medicare HMO | Admitting: Internal Medicine

## 2022-07-26 ENCOUNTER — Encounter: Payer: Self-pay | Admitting: Internal Medicine

## 2022-07-26 VITALS — BP 130/78 | HR 76 | Temp 98.6°F | Ht 72.0 in | Wt 232.5 lb

## 2022-07-26 DIAGNOSIS — I2510 Atherosclerotic heart disease of native coronary artery without angina pectoris: Secondary | ICD-10-CM

## 2022-07-26 DIAGNOSIS — M5440 Lumbago with sciatica, unspecified side: Secondary | ICD-10-CM | POA: Diagnosis not present

## 2022-07-26 DIAGNOSIS — Z23 Encounter for immunization: Secondary | ICD-10-CM

## 2022-07-26 DIAGNOSIS — I7121 Aneurysm of the ascending aorta, without rupture: Secondary | ICD-10-CM

## 2022-07-26 DIAGNOSIS — I350 Nonrheumatic aortic (valve) stenosis: Secondary | ICD-10-CM

## 2022-07-26 DIAGNOSIS — K429 Umbilical hernia without obstruction or gangrene: Secondary | ICD-10-CM | POA: Diagnosis not present

## 2022-07-26 DIAGNOSIS — Z789 Other specified health status: Secondary | ICD-10-CM

## 2022-07-26 HISTORY — DX: Nonrheumatic aortic (valve) stenosis: I35.0

## 2022-07-26 HISTORY — DX: Other specified health status: Z78.9

## 2022-07-26 HISTORY — DX: Aneurysm of the ascending aorta, without rupture: I71.21

## 2022-07-26 NOTE — Assessment & Plan Note (Signed)
F/u w/Dr Revankar - discussing Repatha On Nexletol, Zetia, Fish oil for lipids

## 2022-07-26 NOTE — Assessment & Plan Note (Signed)
Mild to moderate aortic stenosis. Medical management.

## 2022-07-26 NOTE — Assessment & Plan Note (Signed)
On Nexletol, Zetia, Fish oil for lipids

## 2022-07-26 NOTE — Progress Notes (Signed)
Subjective:  Patient ID: [name], male    DOB: 1951-04-20  Age: 71 y.o. MRN: [id number]  CC: Follow-up (3 month f/u- Flu shot)   HPI [name] presents for OA, LBP, AS, ascending aorta dilatation  Outpatient Medications Prior to Visit  Medication Sig Dispense Refill   acetaminophen (TYLENOL) 500 MG tablet Take 500 mg by mouth every 6 (six) hours as needed for headache.     ALPRAZolam (XANAX) 0.5 MG tablet Take 0.5 mg by mouth 2 (two) times daily as needed for anxiety.     aspirin 81 MG tablet Take 81 mg by mouth at bedtime.     Bempedoic Acid (NEXLETOL) 180 MG TABS Take 180 mg by mouth daily.     Cyanocobalamin (VITAMIN B-12) 2500 MCG SUBL Place 5,000 mcg under the tongue daily.     cyclobenzaprine (FLEXERIL) 5 MG tablet Take 1-2 tablets (5-10 mg total) by mouth at bedtime as needed. 60 tablet 3   escitalopram (LEXAPRO) 10 MG tablet Take 1 tablet by mouth once daily 90 tablet 2   ezetimibe (ZETIA) 10 MG tablet Take 0.5 tablets (5 mg total) by mouth daily. 45 tablet 3   fluticasone (FLONASE) 50 MCG/ACT nasal spray Use 1 spray(s) in each nostril once daily 16 g 3   loratadine (EQ LORATADINE) 10 MG tablet TAKE 1 TABLET BY MOUTH ONCE DAILY AS NEEDED FOR ALLERGIES 90 tablet 3   meloxicam (MOBIC) 7.5 MG tablet Take 2 tablets by mouth once daily 180 tablet 0   Multiple Vitamin (MULTIVITAMIN WITH MINERALS) TABS tablet Take 1 tablet by mouth daily.     nitroGLYCERIN (NITROSTAT) 0.4 MG SL tablet Place 1 tablet (0.4 mg total) under the tongue every 5 (five) minutes as needed for chest pain. 25 tablet 6   OVER THE COUNTER MEDICATION Take 3 capsules by mouth every morning. Flexnow     pantoprazole (PROTONIX) 40 MG tablet Take 1 tablet by mouth twice daily 180 tablet 1   traMADol (ULTRAM) 50 MG tablet TAKE 1 TO 2 TABLETS BY MOUTH THREE TIMES DAILY AS NEEDED 180 tablet 0   valACYclovir (VALTREX) 500 MG tablet Take 1 tablet by mouth once daily 90 tablet 1   No facility-administered  medications prior to visit.    ROS: Review of Systems  Constitutional:  Positive for fatigue. Negative for appetite change and unexpected weight change.  HENT:  Negative for congestion, nosebleeds, sneezing, sore throat and trouble swallowing.   Eyes:  Negative for itching and visual disturbance.  Respiratory:  Positive for shortness of breath. Negative for cough.   Cardiovascular:  Negative for chest pain, palpitations and leg swelling.  Gastrointestinal:  Positive for abdominal pain. Negative for abdominal distention, blood in stool, diarrhea and nausea.  Genitourinary:  Negative for frequency and hematuria.  Musculoskeletal:  Positive for arthralgias, back pain and gait problem. Negative for joint swelling and neck pain.  Skin:  Negative for rash.  Neurological:  Negative for dizziness, tremors, speech difficulty and weakness.  Psychiatric/Behavioral:  Negative for agitation, decreased concentration, dysphoric mood and sleep disturbance. The patient is not nervous/anxious.     Objective:  BP 130/78 (BP Location: Left Arm)   Pulse 76   Temp 98.6 F (37 C) (Oral)   Ht 6' (1.829 m)   Wt 232 lb 8 oz (105.5 kg)   SpO2 96%   BMI 31.53 kg/m   BP Readings from Last 3 Encounters:  07/26/22 130/78  07/13/22 118/68  05/24/22 130/70  Wt Readings from Last 3 Encounters:  07/26/22 232 lb 8 oz (105.5 kg)  07/13/22 230 lb 1.9 oz (104.4 kg)  05/24/22 226 lb (102.5 kg)    Physical Exam Constitutional:      General: He is not in acute distress.    Appearance: He is well-developed. He is obese.     Comments: NAD  Eyes:     Conjunctiva/sclera: Conjunctivae normal.     Pupils: Pupils are equal, round, and reactive to light.  Neck:     Thyroid: No thyromegaly.     Vascular: No JVD.  Cardiovascular:     Rate and Rhythm: Normal rate. Rhythm irregular.     Heart sounds: Murmur heard.     No friction rub. No gallop.  Pulmonary:     Effort: Pulmonary effort is normal. No respiratory  distress.     Breath sounds: Normal breath sounds. No wheezing or rales.  Chest:     Chest wall: No tenderness.  Abdominal:     General: Bowel sounds are normal. There is no distension.     Palpations: Abdomen is soft. There is no mass.     Tenderness: There is no abdominal tenderness. There is no guarding or rebound.     Hernia: A hernia is present.  Musculoskeletal:        General: Tenderness present. Normal range of motion.     Cervical back: Normal range of motion.  Lymphadenopathy:     Cervical: No cervical adenopathy.  Skin:    General: Skin is warm and dry.     Findings: No rash.  Neurological:     Mental Status: He is alert and oriented to person, place, and time.     Cranial Nerves: No cranial nerve deficit.     Motor: No abnormal muscle tone.     Coordination: Coordination normal.     Gait: Gait abnormal.     Deep Tendon Reflexes: Reflexes are normal and symmetric.  Psychiatric:        Behavior: Behavior normal.        Thought Content: Thought content normal.        Judgment: Judgment normal.   Antalgic gait LS w/pain  Lab Results  Component Value Date   WBC 6.5 07/13/2022   HGB 15.0 07/13/2022   HCT 43.3 07/13/2022   PLT 269 07/13/2022   GLUCOSE 112 (H) 07/13/2022   CHOL 191 07/13/2022   TRIG 231 (H) 07/13/2022   HDL 44 07/13/2022   LDLDIRECT 110 (H) 10/07/2019   LDLCALC 107 (H) 07/13/2022   ALT 24 07/13/2022   AST 23 07/13/2022   NA 140 07/13/2022   K 4.9 07/13/2022   CL 101 07/13/2022   CREATININE 0.83 07/13/2022   BUN 17 07/13/2022   CO2 24 07/13/2022   TSH 3.650 07/13/2022   PSA 1.18 07/28/2020   INR 1.0 08/29/2017   HGBA1C 6.0 (H) 07/13/2022    ECHOCARDIOGRAM COMPLETE  Result Date: 07/19/2022    ECHOCARDIOGRAM REPORT   Patient Name:   [name] Date of Exam: 07/19/2022 Medical Rec #:  [id number]      Height:       72.0 in Accession #:    [id number]     Weight:       230.1 lb Date of Birth:  [birth year]      BSA:          2.261 m Patient Age:     38 years  BP:           106/68 mmHg Patient Gender: M              HR:           72 bpm. Exam Location:  [facility] Procedure: 2D Echo, Cardiac Doppler, Color Doppler and 3D Echo Indications:    CAD  History:        Patient has prior history of Echocardiogram examinations, most                 recent 06/15/2015. CAD, Signs/Symptoms:Murmur; Risk                 Factors:Hypertension and Dyslipidemia.  Sonographer:    Merrie Roof RDCS Referring Phys: [place]  1. Left ventricular ejection fraction, by estimation, is 60 to 65%. The left ventricle has normal function. The left ventricle has no regional wall motion abnormalities. There is mild left ventricular hypertrophy. Left ventricular diastolic parameters are consistent with Grade I diastolic dysfunction (impaired relaxation).  2. Right ventricular systolic function is normal. The right ventricular size is normal.  3. Left atrial size was mildly dilated.  4. The mitral valve is normal in structure. No evidence of mitral valve regurgitation. No evidence of mitral stenosis.  5. The aortic valve is calcified. Aortic valve regurgitation is mild. Mild to moderate aortic valve stenosis. Aortic valve area, by VTI measures 1.21 cm. Aortic valve mean gradient measures 20.0 mmHg.  6. Aneurysm of the ascending aorta, measuring 41 mm.  7. The inferior vena cava is normal in size with greater than 50% respiratory variability, suggesting right atrial pressure of 3 mmHg. FINDINGS  Left Ventricle: Left ventricular ejection fraction, by estimation, is 60 to 65%. The left ventricle has normal function. The left ventricle has no regional wall motion abnormalities. The left ventricular internal cavity size was normal in size. There is  mild left ventricular hypertrophy. Left ventricular diastolic parameters are consistent with Grade I diastolic dysfunction (impaired relaxation). Right Ventricle: The right ventricular size is normal. No increase in right  ventricular wall thickness. Right ventricular systolic function is normal. Left Atrium: Left atrial size was mildly dilated. Right Atrium: Right atrial size was normal in size. Pericardium: There is no evidence of pericardial effusion. Mitral Valve: The mitral valve is normal in structure. No evidence of mitral valve regurgitation. No evidence of mitral valve stenosis. Tricuspid Valve: The tricuspid valve is normal in structure. Tricuspid valve regurgitation is not demonstrated. No evidence of tricuspid stenosis. Aortic Valve: The aortic valve is calcified. Aortic valve regurgitation is mild. Aortic regurgitation PHT measures 833 msec. Mild to moderate aortic stenosis is present. Aortic valve mean gradient measures 20.0 mmHg. Aortic valve peak gradient measures 29.8 mmHg. Aortic valve area, by VTI measures 1.21 cm. Pulmonic Valve: The pulmonic valve was normal in structure. Pulmonic valve regurgitation is not visualized. No evidence of pulmonic stenosis. Aorta: The aortic root is normal in size and structure. There is an aneurysm involving the ascending aorta measuring 41 mm. Venous: The inferior vena cava is normal in size with greater than 50% respiratory variability, suggesting right atrial pressure of 3 mmHg. IAS/Shunts: No atrial level shunt detected by color flow Doppler.  LEFT VENTRICLE PLAX 2D LVIDd:         4.00 cm   Diastology LVIDs:         2.80 cm   LV e' medial:    7.29 cm/s LV PW:  1.20 cm   LV E/e' medial:  9.1 LV IVS:        1.20 cm   LV e' lateral:   7.94 cm/s LVOT diam:     2.00 cm   LV E/e' lateral: 8.4 LV SV:         72 LV SV Index:   32 LVOT Area:     3.14 cm                           3D Volume EF:                          3D EF:        59 %                          LV EDV:       168 ml                          LV ESV:       70 ml                          LV SV:        99 ml RIGHT VENTRICLE RV Basal diam:  3.30 cm RV S prime:     13.40 cm/s TAPSE (M-mode): 3.4 cm LEFT ATRIUM              Index        RIGHT ATRIUM           Index LA diam:        4.60 cm 2.03 cm/m   RA Area:     19.70 cm LA Vol (A2C):   82.6 ml 36.53 ml/m  RA Volume:   51.00 ml  22.55 ml/m LA Vol (A4C):   69.6 ml 30.78 ml/m LA Biplane Vol: 79.8 ml 35.29 ml/m  AORTIC VALVE AV Area (Vmax):    1.26 cm AV Area (Vmean):   1.10 cm AV Area (VTI):     1.21 cm AV Vmax:           273.00 cm/s AV Vmean:          211.000 cm/s AV VTI:            0.596 m AV Peak Grad:      29.8 mmHg AV Mean Grad:      20.0 mmHg LVOT Vmax:         109.43 cm/s LVOT Vmean:        74.033 cm/s LVOT VTI:          0.230 m LVOT/AV VTI ratio: 0.39 AI PHT:            833 msec  AORTA Ao Root diam: 3.40 cm Ao Asc diam:  4.10 cm Ao Arch diam: 3.6 cm MITRAL VALVE MV Area (PHT): 3.01 cm     SHUNTS MV Decel Time: 252 msec     Systemic VTI:  0.23 m MV E velocity: 66.40 cm/s   Systemic Diam: 2.00 cm MV A velocity: 100.00 cm/s MV E/A ratio:  0.66 Jenne Campus MD Electronically signed by Jenne Campus MD Signature Date/Time: 07/19/2022/12:46:24 PM    Final     Assessment & Plan:   Problem List Items Addressed This Visit     Aortic  stenosis, moderate    Mild to moderate aortic stenosis.  Medical management.      Ascending aortic aneurysm (HCC)    2023 ECHO 4.1 cm Chest CT is pending      CAD (coronary artery disease)    F/u w/Dr Revankar - discussing Repatha On Nexletol, Zetia, Fish oil for lipids      Low back pain    Cont on Tramadol  Potential benefits of a long term opioids use as well as potential risks (i.e. addiction risk, apnea etc) and complications (i.e. Somnolence, constipation and others) were explained to the patient and were aknowledged.      Statin intolerance    On Nexletol, Zetia, Fish oil for lipids      Umbilical hernia    S/p surgical consult - surgery after colonoscopy      Other Visit Diagnoses     Needs flu shot    -  Primary   Relevant Orders   Flu Vaccine QUAD High Dose(Fluad) (Completed)         No  orders of the defined types were placed in this encounter.     Follow-up: Return in about 3 months (around 10/25/2022) for a follow-up visit.  Walker Kehr, MD

## 2022-07-26 NOTE — Assessment & Plan Note (Addendum)
2023 ECHO 4.1 cm Chest CT is pending

## 2022-07-26 NOTE — Assessment & Plan Note (Signed)
Cont on Tramadol  Potential benefits of a long term opioids use as well as potential risks (i.e. addiction risk, apnea etc) and complications (i.e. Somnolence, constipation and others) were explained to the patient and were aknowledged. 

## 2022-07-26 NOTE — Assessment & Plan Note (Signed)
S/p surgical consult - surgery after colonoscopy

## 2022-07-29 ENCOUNTER — Encounter: Payer: Self-pay | Admitting: Cardiology

## 2022-08-01 ENCOUNTER — Other Ambulatory Visit: Payer: Self-pay | Admitting: Internal Medicine

## 2022-08-03 ENCOUNTER — Ambulatory Visit (HOSPITAL_BASED_OUTPATIENT_CLINIC_OR_DEPARTMENT_OTHER)
Admission: RE | Admit: 2022-08-03 | Discharge: 2022-08-03 | Disposition: A | Discharge status: Home / Self Care | Payer: Medicare HMO | Source: Ambulatory Visit | Attending: Cardiology | Admitting: Cardiology

## 2022-08-03 DIAGNOSIS — I77810 Thoracic aortic ectasia: Secondary | ICD-10-CM | POA: Insufficient documentation

## 2022-08-03 DIAGNOSIS — I700 Atherosclerosis of aorta: Secondary | ICD-10-CM | POA: Diagnosis not present

## 2022-08-03 DIAGNOSIS — J439 Emphysema, unspecified: Secondary | ICD-10-CM | POA: Diagnosis not present

## 2022-08-12 ENCOUNTER — Telehealth: Payer: Self-pay

## 2022-08-12 NOTE — Telephone Encounter (Signed)
Spoke with pt. Reviewed CT results and discussed lipid clinic and the reason he was referred there. Pt verbalized understanding and had no further questions.

## 2022-08-16 ENCOUNTER — Ambulatory Visit: Payer: Medicare HMO | Attending: Cardiology | Admitting: Pharmacist

## 2022-08-16 ENCOUNTER — Encounter: Payer: Self-pay | Admitting: Pharmacist

## 2022-08-16 ENCOUNTER — Telehealth: Payer: Self-pay | Admitting: Pharmacist

## 2022-08-16 DIAGNOSIS — I700 Atherosclerosis of aorta: Secondary | ICD-10-CM

## 2022-08-16 DIAGNOSIS — E785 Hyperlipidemia, unspecified: Secondary | ICD-10-CM

## 2022-08-16 DIAGNOSIS — I2510 Atherosclerotic heart disease of native coronary artery without angina pectoris: Secondary | ICD-10-CM

## 2022-08-16 DIAGNOSIS — M791 Myalgia, unspecified site: Secondary | ICD-10-CM

## 2022-08-16 DIAGNOSIS — I350 Nonrheumatic aortic (valve) stenosis: Secondary | ICD-10-CM

## 2022-08-16 DIAGNOSIS — T466X5A Adverse effect of antihyperlipidemic and antiarteriosclerotic drugs, initial encounter: Secondary | ICD-10-CM

## 2022-08-16 DIAGNOSIS — I25119 Atherosclerotic heart disease of native coronary artery with unspecified angina pectoris: Secondary | ICD-10-CM

## 2022-08-16 HISTORY — DX: Atherosclerosis of aorta: I70.0

## 2022-08-16 HISTORY — DX: Adverse effect of antihyperlipidemic and antiarteriosclerotic drugs, initial encounter: T46.6X5A

## 2022-08-16 MED ORDER — REPATHA SURECLICK 140 MG/ML ~~LOC~~ SOAJ: 1.0000 mL | SUBCUTANEOUS | 11 refills | Status: DC

## 2022-08-16 NOTE — Progress Notes (Signed)
Patient ID: [name]                 DOB: 16-Nov-1950                    MRN: [id number]     HPI: [name] is a 71 y.o. male patient referred to lipid clinic by [name]. PMH is significant for CAD, pre DM, HTN, OSA, degenerative disc disease, knee replacement, and statin myalgia. Currently managed on Nexletol and Zetia which also cause myalgias.  Patient presents today to discuss cholesterol. Previously seen in lipid clinic one year ago where he expressed desire to try to improve LDL through diet and exercise.  Has been on and off Nexletol in past year.  Came back from 2 weeks in [place] where he reported he was not eating or drinking healthy.  Has significant back, leg, and knee pain which he treats with Bengay at night and uses a Xanax to help sleep. Also has meloxicam and tramadol at home.  Recent chest CT showed aortic atherosclerosis and possible pulmonary hypertension.  Current Medications:  Nexletol '180mg'$  daily Zetia '5mg'$ mg daily  Intolerances:  Atovastatin Vascepa Pravastatin  Risk Factors:  CAD HTN Smoking history  LDL goal: <70  Labs: HDL 44, LDL 107, Trigs 231, TC 191 (07/13/22 on Zetia '5mg'$  and Nexletol '180mg'$ )  Past Medical History:  Diagnosis Date   Actinic keratosis    Acute sinusitis 06/21/2010   9/14, 10/19    Allergy    Anxiety 08/24/2017   Chronic  Worse in 10/18 Xanax prn - d/c  Potential benefits of a long term benzodiazepines  use as well as potential risks  and complications were explained to the patient and were aknowledged. 11/18 Lexapro     Arthralgia 04/14/2017   2018 2019 Rheum consult; Gluten free trial Andre [name] 12/[facility] Depo-medrol IM Increase Tramadol w/caution  Potential benefits of a long term opioids use as well as potential risks (i.e. addiction risk, apnea etc) and complications (i.e. Somnolence, constipation and others) were explained to the patient and were aknowledged.    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 Pravastatin - pt stopped due to side effects 1/21 Zetia - causing pain too Krill oil d/c.  Repatha option discussed.  9/21 Vascepa, Zetia   Chest pain 12/07/2020   Chondrocalcinosis 07/06/2018   Chronic sinusitis 08/12/2013    Sinus congestion, SOB - better after surgery - nov 2014 9/14 worse CT IMPRESSION:  Minimal sinus mucosal thickening, primarily in the frontal recesses.  Small right maxillary mucous retention cyst. No CT evidence of acute  sinusitis.  Electronically Signed  By: [name] M.D.  On: 08/15/2013 09:19  Doxy x 4 wks   Colonic polyp    COLONIC POLYPS, HX OF    CONTACT DERMATITIS DUE TO SOLVENTS    DDD (degenerative disc disease), cervical 07/06/2018   DDD (degenerative disc disease), lumbar 07/06/2018  and facet joint arthropathy   Dizzinesses 07/18/2017   9/18 I suggested a brain MRI to r/o acustic neuroma etc: he will think about it and check on cost ENT ref suggested   DOE (dyspnea on exertion) 07/25/2012   related to eating shrimp   Dyslipidemia 06/21/2013   Chronic  Elev TG, low HDL, nl LDL Pravastatin - pt stopped due to side effects 1/21 Repatha option discussed. On Zetia - c/o side effeects 9/21 Vascepa, Zetia   Dyspnea 07/25/2012   related to eating shrimp   Essential hypertension 07/29/2021   Fatigue 06/09/2015   2016 no OSA  2018 much better (60%) off Reglan.    Folliculitis 44/11/270   GERD 05/09/2008   Chronic  -- Protonix 5/18 We discussed his  GERD/Barrett's treated at [facility] ([name]). Reglan was d/c'd due to CP. He was dx'd w/pancreatitis and started on Zenpep He had an abd CT, [place], EGD and colonoscopy. 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 Try Flexeril at hs.   KNEE PAIN    Laryngopharyngeal reflux (LPR) 10/02/2018   Localized swelling, mass or lump of neck 09/05/2018   Low back pain 06/09/2015   Per Andre [name] 3/17 - planning to see Andre [name] Body pains are much better (60%) off Reglan. Off Tramadol 2019 Rheum consult; Gluten free trial Tramadol  Potential benefits of a long term opioids use as well as potential risks (i.e. addiction risk, apnea etc) and complications (i.e. Somnolence, constipation and others) were explained to the patient and were aknowledged.   Nausea 07/18/2017   Chronic w/dizziness I suggested a brain MRI to r/o acustic neuroma etc: he will think about it and check on cost 9/18   Neoplasm of uncertain behavior of skin    OA (osteoarthritis) of knee    Obesity (BMI 30.0-34.9) 01/07/2021   ONYCHOMYCOSIS    OSA (obstructive sleep apnea) 08/21/2013   2014 no need for CPAP    Preop exam for internal medicine    Primary osteoarthritis of both hands 11/21/2008   Chronic and severe Tramadol  Potential benefits of a long term opioids use as well as potential risks (i.e. addiction risk, apnea etc) and complications (i.e. Somnolence, constipation and others) were explained to the patient and were aknowledged.   10/18 Body pains are much better (60%) off Reglan. 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 Xanax prn - d/c  Potential benefits of a long term benzodiazepines  use as well as potential risks  and complications were explained to the patient and were aknowledged. Neurol ref - Andre Tat:   "Hi   Umbilical hernia 2/77/4128   Relapsed Surgical ref   Upper respiratory infection 07/27/2018   9/19, 12/21   Well adult exam     Current Outpatient Medications on File Prior to Visit  Medication Sig Dispense Refill   acetaminophen (TYLENOL) 500 MG tablet Take 500 mg by mouth every 6 (six) hours as needed for headache.     ALPRAZolam (XANAX) 0.5 MG tablet Take 0.5 mg by mouth 2 (two) times daily as needed for anxiety.     aspirin 81 MG tablet Take 81 mg by mouth at bedtime.     Bempedoic Acid (NEXLETOL) 180 MG TABS Take 180 mg by mouth daily.     Cyanocobalamin (VITAMIN B-12) 2500 MCG SUBL Place 5,000 mcg under the tongue daily.     cyclobenzaprine (FLEXERIL) 5 MG tablet Take 1-2 tablets (5-10 mg total) by mouth at bedtime as needed. 60 tablet 3   escitalopram (LEXAPRO) 10 MG tablet Take 1 tablet by mouth once daily 90 tablet 2   ezetimibe (ZETIA) 10 MG tablet Take 0.5 tablets (5 mg total) by mouth daily. 45 tablet 3   fluticasone (FLONASE) 50 MCG/ACT nasal spray Use 1 spray(s) in each nostril once daily 16 g 3   loratadine (EQ LORATADINE) 10 MG tablet TAKE 1 TABLET BY MOUTH ONCE DAILY AS NEEDED FOR ALLERGIES 90 tablet 3    meloxicam (MOBIC) 7.5 MG tablet Take 2 tablets by mouth once daily 180 tablet 0   Multiple Vitamin (MULTIVITAMIN WITH MINERALS) TABS tablet Take 1 tablet by mouth daily.     nitroGLYCERIN (NITROSTAT) 0.4 MG SL tablet Place 1 tablet (0.4 mg total) under the tongue every 5 (five) minutes as needed for chest pain. 25 tablet 6   OVER THE COUNTER MEDICATION Take 3 capsules by mouth every morning. Flexnow     pantoprazole (PROTONIX) 40 MG tablet Take 1 tablet by mouth twice daily 180 tablet 1   traMADol (ULTRAM) 50 MG tablet TAKE 1 TO 2 TABLETS BY MOUTH THREE TIMES DAILY AS NEEDED 180 tablet 0   valACYclovir (VALTREX) 500 MG tablet Take 1 tablet by mouth once daily 90 tablet 1   No current facility-administered medications on file prior to visit.    Allergies  Allergen Reactions   Hytrin [Terazosin] Other (See Comments)    "Makes me jittery"   Icosapent Ethyl     Can't take Vascepa - palpitations   Levaquin [Levofloxacin In D5w]     Cramps    Lipitor [Atorvastatin]     myalgias   Nexletol [Bempedoic Acid] Nausea And Vomiting   Penicillins Other (See Comments)    Convulsions Has patient had a PCN reaction causing immediate rash, facial/tongue/throat swelling, SOB or lightheadedness with hypotension: No Has patient had a PCN reaction causing severe rash involving mucus membranes or skin necrosis: No Has patient had a PCN reaction that required hospitalization: Yes Has patient had a PCN reaction occurring within the last 10 years: No If all of the above answers are "NO", then may proceed with Cephalosporin use.    Reglan [Metoclopramide]     Jerking, pains, CP   Xanax [Alprazolam]     "drunk" feeling    Assessment/Plan:  1. Hyperlipidemia -  Patient recent LDL 107 which is improved, however still above goal of <70. Continues to have myalgias from Nexletol and Zetia. Recommend starting PCSK9i.  Using [employer], educated patient on mechanism of action, storage, site selection,  administration, and possible side effects. Patient voiced understanding although he expressed fear of needles. Will complete PA and contact patient when approved. Patient worried he will enter donut hole. Advised if that occurs then the next step would be a patient assistance application. Patient voiced understanding. Recheck lipid panel in 2-3 months.  If Repatha approved and affordable, patient can stop Nexletol and Zetia at that time since they are causing myalgias.  Karren Cobble, PharmD, BCACP, [place], [place], [place], [place], [address] Phone: [phone], Fax: [phone]

## 2022-08-16 NOTE — Patient Instructions (Addendum)
It was nice meeting you today  We would like your LDL (bad cholesterol) to be less than 70  We recommend starting a new medication called Repatha which you will inject once every 2 weeks  I will complete the prior authorization for you and contact you when it is approved  Once you start the medication we will recheck your cholesterol levels in 2-3 months  You can then stop your Nexletol and ezetimibe  Please call with any questions   Karren Cobble, PharmD, [place], [place], [place], [place], [place], [address] Phone: [phone], Fax: [phone]

## 2022-08-16 NOTE — Telephone Encounter (Signed)
Repatha approved through 11/14/23

## 2022-08-16 NOTE — Telephone Encounter (Signed)
PA for Repatha submitted. Key: [id number]

## 2022-08-16 NOTE — Addendum Note (Signed)
Addended by: Rollen Sox on: 08/16/2022 05:43 PM   Modules accepted: Orders

## 2022-08-25 ENCOUNTER — Other Ambulatory Visit: Payer: Self-pay

## 2022-08-25 DIAGNOSIS — I2510 Atherosclerotic heart disease of native coronary artery without angina pectoris: Secondary | ICD-10-CM

## 2022-08-25 MED ORDER — NITROGLYCERIN 0.4 MG SL SUBL: 0.4000 mg | SUBLINGUAL_TABLET | SUBLINGUAL | 9 refills | Status: DC | PRN

## 2022-09-11 ENCOUNTER — Other Ambulatory Visit: Payer: Self-pay | Admitting: Internal Medicine

## 2022-09-15 NOTE — Telephone Encounter (Signed)
Patient called to follow up on this because he is out.

## 2022-10-09 ENCOUNTER — Other Ambulatory Visit: Payer: Self-pay | Admitting: Internal Medicine

## 2022-10-21 ENCOUNTER — Ambulatory Visit: Payer: Medicare HMO | Admitting: Internal Medicine

## 2022-10-21 ENCOUNTER — Encounter: Payer: Self-pay | Admitting: Internal Medicine

## 2022-10-21 VITALS — BP 100/82 | HR 59 | Ht 72.0 in | Wt 233.2 lb

## 2022-10-21 DIAGNOSIS — R1310 Dysphagia, unspecified: Secondary | ICD-10-CM

## 2022-10-21 DIAGNOSIS — Z86010 Personal history of colonic polyps: Secondary | ICD-10-CM | POA: Diagnosis not present

## 2022-10-21 DIAGNOSIS — K219 Gastro-esophageal reflux disease without esophagitis: Secondary | ICD-10-CM | POA: Diagnosis not present

## 2022-10-21 MED ORDER — NA SULFATE-K SULFATE-MG SULF 17.5-3.13-1.6 GM/177ML PO SOLN
1.0000 | Freq: Once | ORAL | 0 refills | Status: AC
Start: 2022-10-21 — End: 2022-10-21

## 2022-10-21 NOTE — Patient Instructions (Signed)
_______________________________________________________  If you are age 69 or older, your body mass index should be between 23-30. Your Body mass index is 31.63 kg/m. If this is out of the aforementioned range listed, please consider follow up with your Primary Care Provider.  If you are age 24 or younger, your body mass index should be between 19-25. Your Body mass index is 31.63 kg/m. If this is out of the aformentioned range listed, please consider follow up with your Primary Care Provider.   ________________________________________________________  The [facility] providers would like to encourage you to use [facility] to communicate with providers for non-urgent requests or questions.  Due to long hold times on the telephone, sending your provider a message by [facility] may be a faster and more efficient way to get a response.  Please allow 48 business hours for a response.  Please remember that this is for non-urgent requests.  _______________________________________________________  [name] have been scheduled for an endoscopy and colonoscopy. Please follow the written instructions given to you at your visit today. Please pick up your prep supplies at the pharmacy within the next 1-3 days. If you use inhalers (even only as needed), please bring them with you on the day of your procedure.

## 2022-10-21 NOTE — Progress Notes (Signed)
HISTORY OF PRESENT ILLNESS:  [name] is a 71 y.o. male with multiple significant medical problems as listed below who presents today regarding surveillance colonoscopy and ongoing management of chronic GERD.  He was last evaluated in the office August 2018.  See that dictation for details.  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  Follow-up colonoscopy January 2021 revealed a diminutive adenoma which was removed.  Biopsies from the polypectomy scar were negative for residual adenoma.  Follow-up in 2 years recommended.  Patient denies lower abdominal complaints.  He will have occasional diarrhea and irritated hemorrhoids.  Terms of his reflux, he takes pantoprazole 40 mg twice daily.  Lower dosages resulted in breakthrough symptoms.  He does report some mild dysphagia for which she is interested in endoscopic assessment.  His last upper endoscopy was performed June 2020.  Mild esophagitis.  No endoscopic evidence of Barrett's.  Review of outside blood work from August 2023 shows unremarkable comprehensive metabolic panel with normal liver test.  Normal CBC with hemoglobin 15.0.  Abdominal ultrasound November 2020 revealed fatty liver.  He does have mild to moderate aortic stenosis for which she has recently been seen by cardiology including echo with normal EF and chest CT.  REVIEW OF SYSTEMS:  All non-GI ROS negative as otherwise stated in the HPI except for sinus and allergy, arthritis, back pain, cough, fatigue, hearing problems, heart murmur, sleeping problems, excessive urination, shortness of breath  Past Medical History:  Diagnosis Date   Actinic keratosis    Acute sinusitis 06/21/2010   9/14, 10/19    Allergy    Anxiety 08/24/2017   Chronic  Worse in 10/18 Xanax prn - d/c  Potential benefits of a long term benzodiazepines  use as well as potential risks  and complications were explained to the patient  and were aknowledged. 11/18 Lexapro     Arthralgia 04/14/2017   2018 2019 Rheum consult; Gluten free trial Dr Estanislado Pandy 12[facility] Depo-medrol IM Increase Tramadol w/caution  Potential benefits of a long term opioids use as well as potential risks (i.e. addiction risk, apnea etc) and complications (i.e. Somnolence, constipation and others) were explained to the patient and were aknowledged.   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 Pravastatin - pt stopped due to side effects 1/21 Zetia - causing pain too Krill oil d/c.  Repatha option discussed.  9/21 Vascepa, Zetia   Chest pain 12/07/2020   Chondrocalcinosis 07/06/2018   Chronic sinusitis 08/12/2013    Sinus congestion, SOB - better after surgery - nov 2014 9/14 worse CT IMPRESSION:  Minimal sinus mucosal thickening, primarily in the frontal recesses.  Small right maxillary mucous retention cyst. No CT evidence of acute  sinusitis.  Electronically Signed  By: Lars Pinks M.D.  On: 08/15/2013 09:19  Doxy x 4 wks   Colonic polyp  COLONIC POLYPS, HX OF    CONTACT DERMATITIS DUE TO SOLVENTS    DDD (degenerative disc disease), cervical 07/06/2018   DDD (degenerative disc disease), lumbar 07/06/2018   and facet joint arthropathy   Dizzinesses 07/18/2017   9/18 I suggested a brain MRI to r/o acustic neuroma etc: he will think about it and check on cost ENT ref suggested   DOE (dyspnea on exertion) 07/25/2012   related to eating shrimp    Dyslipidemia 06/21/2013   Chronic  Elev TG, low HDL, nl LDL Pravastatin - pt stopped due to side effects 1/21 Repatha option discussed. On Zetia - c/o side effeects 9/21 Vascepa, Zetia   Dyspnea 07/25/2012   related to eating shrimp   Essential hypertension 07/29/2021   Fatigue 06/09/2015   2016 no OSA  2018 much better (60%) off Reglan.    Folliculitis 02/58/5277   GERD 05/09/2008   Chronic  -- Protonix 5/18 We discussed his GERD/Barrett's treated at [facility] (Dr Ferdinand Lango). Reglan was d/c'd due to CP. He was dx'd w/pancreatitis and started on Zenpep He had an abd CT, [place], EGD and colonoscopy. 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 Try Flexeril at hs.   KNEE PAIN    Laryngopharyngeal reflux (LPR) 10/02/2018   Localized swelling, mass or lump of neck 09/05/2018   Low back pain 06/09/2015   Per Dr Alvan Dame 3/17 - planning to see Dr Annette Stable Body pains are much better (60%) off Reglan. Off Tramadol 2019 Rheum consult; Gluten free trial Tramadol  Potential benefits of a long term opioids use as well as potential risks (i.e. addiction risk, apnea etc) and complications (i.e. Somnolence, constipation and others) were explained to the patient and were aknowledged.   Nausea 07/18/2017   Chronic w/dizziness I suggested a brain MRI to r/o acustic neuroma etc: he will think about it and check on cost 9/18   Neoplasm of uncertain behavior of skin    OA (osteoarthritis) of knee    Obesity (BMI 30.0-34.9) 01/07/2021   ONYCHOMYCOSIS    OSA (obstructive sleep apnea) 08/21/2013   2014 no  need for CPAP    Preop exam for internal medicine    Primary osteoarthritis of both hands 11/21/2008   Chronic and severe Tramadol  Potential benefits of a long term opioids use as well as potential risks (i.e. addiction risk, apnea etc) and complications (i.e. Somnolence, constipation and others) were explained to the patient and were aknowledged.   10/18 Body pains are much better (60%) off Reglan. 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 Xanax prn - d/c  Potential benefits of a long term benzodiazepines  use as well as potential risks  and complications were explained to the patient and were aknowledged. Neurol ref - Dr Tat:   "Hi   Umbilical hernia 9/73/5329   Relapsed Surgical ref   Upper respiratory infection 07/27/2018   9/19, 12/21   Well adult exam     Past Surgical History:  Procedure Laterality Date   COLONOSCOPY  04/24/2019   JOINT REPLACEMENT  9/13   L TKR   KNEE ARTHROSCOPY     Left   LEFT HEART CATH AND CORONARY ANGIOGRAPHY N/A 08/31/2017   Procedure: LEFT HEART CATH AND CORONARY ANGIOGRAPHY;  Surgeon: Nelva Bush, MD;  Location: [facility];  Service: Cardiovascular;  Laterality: N/A;   TOTAL KNEE ARTHROPLASTY  07/31/2012   Procedure: TOTAL KNEE ARTHROPLASTY;  Surgeon: Sydnee Cabal, MD;  Location: [facility];  Service: Orthopedics;  Laterality: Left;   UMBILICAL HERNIA REPAIR  07-25-12   10 yrs ago    UPPER GASTROINTESTINAL ENDOSCOPY  04/24/2019    Social History [name]  reports that he quit smoking about 15 years ago. His smoking use included cigars. He has never used smokeless tobacco. He reports current alcohol use. He reports current drug use. Drug: Marijuana.  family history includes Colon cancer in his brother; Colon polyps in his brother; Coronary artery disease in an other family member; Diabetes (age of onset: 83) in his mother; Healthy in his daughter; Heart disease (age of onset: 41) in his father; Heart disease (age of onset: 17) in his brother; Lung cancer in his brother.  Allergies  Allergen Reactions   Hytrin [Terazosin] Other (See Comments)    "Makes me jittery"   Icosapent Ethyl     Can't take Vascepa - palpitations   Levaquin [Levofloxacin In D5w]     Cramps    Lipitor [Atorvastatin]     myalgias   Nexletol [Bempedoic Acid] Nausea And Vomiting   Penicillins Other (See Comments)    Convulsions Has patient had a PCN reaction causing immediate rash, facial/tongue/throat swelling, SOB or lightheadedness with hypotension: No Has patient had a PCN reaction causing severe rash involving mucus membranes or skin necrosis: No Has patient had a PCN reaction that required hospitalization: Yes Has patient had a PCN reaction occurring within the last 10 years: No If all of the above answers are "NO", then may proceed with Cephalosporin use.    Reglan [Metoclopramide]     Jerking, pains, CP   Xanax [Alprazolam]     "drunk" feeling       PHYSICAL EXAMINATION: Vital signs: BP 100/82   Pulse (!) 59   Ht 6' (1.829 m)   Wt 233 lb 4 oz (105.8 kg)   BMI 31.63 kg/m   Constitutional: generally well-appearing, no acute distress Psychiatric: alert and oriented x3, cooperative Eyes: extraocular movements intact, anicteric, conjunctiva pink Mouth: oral pharynx moist, no lesions Neck: supple no lymphadenopathy Cardiovascular: heart regular rate and rhythm, 3/6 systolic  ejection murmur Lungs: clear to auscultation bilaterally Abdomen: soft, nontender, nondistended, no obvious ascites, no peritoneal signs, normal bowel sounds, no organomegaly Rectal: Deferred to colonoscopy Extremities: no lower extremity edema bilaterally Skin: no lesions on visible extremities Neuro: No  focal deficits.  Cranial nerves intact  ASSESSMENT:  1.  History of multiple and advanced adenomatous colon polyps.  Due for surveillance. 2.  Chronic GERD requiring twice daily PPI to control symptoms. 3.  Dysphagia likely due to esophageal edema or stricture. 3.  Multiple significant medical problems.  Stable   PLAN:  1.  Reflux precautions 2.  Continue pantoprazole.  Medication risks reviewed 3.  Schedule upper endoscopy with possible esophageal dilation.  The patient is higher than baseline risk due to his comorbidities.The nature of the procedure, as well as the risks, benefits, and alternatives were carefully and thoroughly reviewed with the patient. Ample time for discussion and questions allowed. The patient understood, was satisfied, and agreed to proceed. 4.  Schedule surveillance colonoscopy.The nature of the procedure, as well as the risks, benefits, and alternatives were carefully and thoroughly reviewed with the patient. Ample time for discussion and questions allowed. The patient understood, was satisfied, and agreed to proceed. A total time of 40 minutes was spent preparing to see the patient, reviewing outside data, obtaining comprehensive history, performing medically appropriate physical examination, reviewing multiple endoscopic procedures, counseling and educating patient regarding the above listed issues, and documenting clinical information in the health record.

## 2022-10-24 ENCOUNTER — Other Ambulatory Visit: Payer: Self-pay | Admitting: Internal Medicine

## 2022-10-26 ENCOUNTER — Ambulatory Visit (INDEPENDENT_AMBULATORY_CARE_PROVIDER_SITE_OTHER): Payer: Medicare HMO | Admitting: Internal Medicine

## 2022-10-26 ENCOUNTER — Encounter: Payer: Self-pay | Admitting: Internal Medicine

## 2022-10-26 ENCOUNTER — Telehealth: Payer: Self-pay | Admitting: Internal Medicine

## 2022-10-26 VITALS — BP 132/70 | HR 75 | Temp 98.1°F | Ht 72.0 in | Wt 232.6 lb

## 2022-10-26 DIAGNOSIS — M25562 Pain in left knee: Secondary | ICD-10-CM

## 2022-10-26 DIAGNOSIS — J4521 Mild intermittent asthma with (acute) exacerbation: Secondary | ICD-10-CM | POA: Diagnosis not present

## 2022-10-26 DIAGNOSIS — E669 Obesity, unspecified: Secondary | ICD-10-CM

## 2022-10-26 DIAGNOSIS — E66811 Obesity, class 1: Secondary | ICD-10-CM

## 2022-10-26 DIAGNOSIS — M25561 Pain in right knee: Secondary | ICD-10-CM | POA: Diagnosis not present

## 2022-10-26 DIAGNOSIS — J45909 Unspecified asthma, uncomplicated: Secondary | ICD-10-CM | POA: Insufficient documentation

## 2022-10-26 HISTORY — DX: Unspecified asthma, uncomplicated: J45.909

## 2022-10-26 MED ORDER — FLUTICASONE-SALMETEROL 100-50 MCG/ACT IN AEPB: 1.0000 | INHALATION_SPRAY | Freq: Two times a day (BID) | RESPIRATORY_TRACT | 3 refills | Status: DC

## 2022-10-26 MED ORDER — METHYLPREDNISOLONE 4 MG PO TBPK: ORAL_TABLET | ORAL | 0 refills | Status: DC

## 2022-10-26 NOTE — Telephone Encounter (Signed)
Called pharmacy to double check price of Suprep.  Per pharmacist, price is $4.78.  She stated patient was coming to pick it up later.

## 2022-10-26 NOTE — Assessment & Plan Note (Signed)
Wt Readings from Last 3 Encounters:  10/26/22 232 lb 9.6 oz (105.5 kg)  10/21/22 233 lb 4 oz (105.8 kg)  07/26/22 232 lb 8 oz (105.5 kg)

## 2022-10-26 NOTE — Assessment & Plan Note (Signed)
Not taking Tramadol for 1 month

## 2022-10-26 NOTE — Progress Notes (Signed)
Subjective:  Patient ID: [name], male    DOB: 1951/02/02  Age: 71 y.o. MRN: [id number]  CC: Follow-up (3 month f/u)   HPI [name] presents for OA, LBP, CAD Not taking Tramadol for 1 month C/o chest congestion, cough: was using a jack hammer on quarts - breathing dust...  Outpatient Medications Prior to Visit  Medication Sig Dispense Refill   acetaminophen (TYLENOL) 500 MG tablet Take 500 mg by mouth every 6 (six) hours as needed for headache.     ALPRAZolam (XANAX) 0.5 MG tablet Take 1 tablet by mouth twice daily as needed for anxiety 60 tablet 1   aspirin 81 MG tablet Take 81 mg by mouth at bedtime.     Cyanocobalamin (VITAMIN B-12) 2500 MCG SUBL Place 5,000 mcg under the tongue daily.     escitalopram (LEXAPRO) 10 MG tablet Take 1 tablet by mouth once daily 90 tablet 2   Evolocumab (REPATHA SURECLICK) 263 MG/ML SOAJ Inject 1 mL into the skin every 14 (fourteen) days. 2 mL 11   fluticasone (FLONASE) 50 MCG/ACT nasal spray Use 1 spray(s) in each nostril once daily 16 g 5   loratadine (EQ LORATADINE) 10 MG tablet TAKE 1 TABLET BY MOUTH ONCE DAILY AS NEEDED FOR ALLERGIES 90 tablet 3   meloxicam (MOBIC) 7.5 MG tablet Take 2 tablets by mouth once daily 180 tablet 0   Multiple Vitamin (MULTIVITAMIN WITH MINERALS) TABS tablet Take 1 tablet by mouth daily.     nitroGLYCERIN (NITROSTAT) 0.4 MG SL tablet Place 1 tablet (0.4 mg total) under the tongue every 5 (five) minutes as needed for chest pain. 25 tablet 9   OVER THE COUNTER MEDICATION Take 3 capsules by mouth every morning. Flexnow     pantoprazole (PROTONIX) 40 MG tablet Take 1 tablet by mouth twice daily 180 tablet 1   traMADol (ULTRAM) 50 MG tablet TAKE 1 TO 2 TABLETS BY MOUTH THREE TIMES DAILY AS NEEDED 180 tablet 1   valACYclovir (VALTREX) 500 MG tablet Take 1 tablet by mouth once daily 90 tablet 1   cyclobenzaprine (FLEXERIL) 5 MG tablet Take 1-2 tablets (5-10 mg total) by mouth at bedtime as needed. (Patient not  taking: Reported on 10/26/2022) 60 tablet 3   No facility-administered medications prior to visit.    ROS: Review of Systems  Constitutional:  Negative for appetite change, fatigue and unexpected weight change.  HENT:  Negative for congestion, nosebleeds, sneezing, sore throat and trouble swallowing.   Eyes:  Negative for itching and visual disturbance.  Respiratory:  Negative for cough.   Cardiovascular:  Negative for chest pain, palpitations and leg swelling.  Gastrointestinal:  Negative for abdominal distention, blood in stool, diarrhea and nausea.  Genitourinary:  Negative for frequency and hematuria.  Musculoskeletal:  Positive for arthralgias, back pain and gait problem. Negative for joint swelling and neck pain.  Skin:  Negative for rash.  Neurological:  Negative for dizziness, tremors, speech difficulty and weakness.  Psychiatric/Behavioral:  Negative for agitation, dysphoric mood, sleep disturbance and suicidal ideas. The patient is nervous/anxious.     Objective:  BP 132/70 (BP Location: Left Arm)   Pulse 75   Temp 98.1 F (36.7 C) (Oral)   Ht 6' (1.829 m)   Wt 232 lb 9.6 oz (105.5 kg)   SpO2 97%   BMI 31.55 kg/m   BP Readings from Last 3 Encounters:  10/26/22 132/70  10/21/22 100/82  07/26/22 130/78    Wt Readings from Last 3  Encounters:  10/26/22 232 lb 9.6 oz (105.5 kg)  10/21/22 233 lb 4 oz (105.8 kg)  07/26/22 232 lb 8 oz (105.5 kg)    Physical Exam Constitutional:      General: He is not in acute distress.    Appearance: He is well-developed. He is obese.     Comments: NAD  Eyes:     Conjunctiva/sclera: Conjunctivae normal.     Pupils: Pupils are equal, round, and reactive to light.  Neck:     Thyroid: No thyromegaly.     Vascular: No JVD.  Cardiovascular:     Rate and Rhythm: Normal rate and regular rhythm.     Heart sounds: Normal heart sounds. No murmur heard.    No friction rub. No gallop.  Pulmonary:     Effort: Pulmonary effort is  normal. No respiratory distress.     Breath sounds: Normal breath sounds. No wheezing or rales.  Chest:     Chest wall: No tenderness.  Abdominal:     General: Bowel sounds are normal. There is no distension.     Palpations: Abdomen is soft. There is no mass.     Tenderness: There is no abdominal tenderness. There is no guarding or rebound.  Musculoskeletal:        General: No tenderness. Normal range of motion.     Cervical back: Normal range of motion.  Lymphadenopathy:     Cervical: No cervical adenopathy.  Skin:    General: Skin is warm and dry.     Findings: No rash.  Neurological:     Mental Status: He is alert and oriented to person, place, and time.     Cranial Nerves: No cranial nerve deficit.     Motor: No abnormal muscle tone.     Coordination: Coordination normal.     Gait: Gait normal.     Deep Tendon Reflexes: Reflexes are normal and symmetric.  Psychiatric:        Behavior: Behavior normal.        Thought Content: Thought content normal.        Judgment: Judgment normal.     Lab Results  Component Value Date   WBC 6.5 07/13/2022   HGB 15.0 07/13/2022   HCT 43.3 07/13/2022   PLT 269 07/13/2022   GLUCOSE 112 (H) 07/13/2022   CHOL 191 07/13/2022   TRIG 231 (H) 07/13/2022   HDL 44 07/13/2022   LDLDIRECT 110 (H) 10/07/2019   LDLCALC 107 (H) 07/13/2022   ALT 24 07/13/2022   AST 23 07/13/2022   NA 140 07/13/2022   K 4.9 07/13/2022   CL 101 07/13/2022   CREATININE 0.83 07/13/2022   BUN 17 07/13/2022   CO2 24 07/13/2022   TSH 3.650 07/13/2022   PSA 1.18 07/28/2020   INR 1.0 08/29/2017   HGBA1C 6.0 (H) 07/13/2022    CT CHEST WO CONTRAST  Result Date: 08/04/2022 CLINICAL DATA:  Follow-up of aortic aneurysm identified on echo. EXAM: CT CHEST WITHOUT CONTRAST TECHNIQUE: Multidetector CT imaging of the chest was performed following the standard protocol without IV contrast. RADIATION DOSE REDUCTION: This exam was performed according to the departmental  dose-optimization program which includes automated exposure control, adjustment of the mA and/or kV according to patient size and/or use of iterative reconstruction technique. COMPARISON:  11/12/2015 FINDINGS: Cardiovascular: Normal caliber of the great vessels. Aortic atherosclerosis. Ascending aorta measures 4.1 cm just above the sino-tubular junction on transverse image 73/2 and 4.2 cm in the mid ascending segment  on transverse image 63/2. Compare maximally 4.1 cm on the prior. Based on coronal reformats, the aorta measures 4.2 cm in the mid ascending segment on image 88/5. 3.1 cm at the sinuses of Valsalva and 3.2 cm at the sinotubular junction. Normal transverse and descending aortic caliber. Mild cardiomegaly with LAD and right coronary artery calcification. Aortic valve calcification suggest valvular disease. Pulmonary artery enlargement, outflow tract 3.4 cm. Mediastinum/Nodes: No mediastinal or definite hilar adenopathy, given limitations of unenhanced CT. Lungs/Pleura: No pleural fluid. Left hemidiaphragm elevation. Mild centrilobular and paraseptal emphysema. 2-3 mm right middle lobe pulmonary nodule on 59/4 was present on 11/12/2015 and is presumed benign. Upper Abdomen: Normal imaged portions of the liver, spleen, stomach, pancreas, adrenal glands, kidneys. Musculoskeletal: No acute osseous abnormality. IMPRESSION: 1. 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 Slightly increased compared to 2016. Recommend annual imaging followup by CTA or MRA. This recommendation follows 2010 ACCF/AHA/AATS/ACR/ASA/SCA/SCAI/SIR/STS/SVM Guidelines for the Diagnosis and Management of Patients with Thoracic Aortic Disease. Circulation. 2010; 121: V253-G644. Aortic aneurysm NOS (ICD10-I71.9) 2.  No acute process in the chest. 3. Aortic atherosclerosis (ICD10-I70.0), coronary artery atherosclerosis and emphysema (ICD10-J43.9). 4. Pulmonary artery enlargement suggests pulmonary arterial hypertension. Electronically Signed    By: Abigail Miyamoto M.D.   On: 08/04/2022 12:17    Assessment & Plan:   Problem List Items Addressed This Visit     Obesity (BMI 30.0-34.9) - Primary    Wt Readings from Last 3 Encounters:  10/26/22 232 lb 9.6 oz (105.5 kg)  10/21/22 233 lb 4 oz (105.8 kg)  07/26/22 232 lb 8 oz (105.5 kg)        KNEE PAIN    Not taking Tramadol for 1 month      Asthmatic bronchitis    C/o chest congestion, cough: was using a jack hammer on quarts - breathing dust... Use a face mask, filters, face shield  Medrol pac Advair bid       Relevant Medications   methylPREDNISolone (MEDROL DOSEPAK) 4 MG TBPK tablet   fluticasone-salmeterol (ADVAIR) 100-50 MCG/ACT AEPB      Meds ordered this encounter  Medications   methylPREDNISolone (MEDROL DOSEPAK) 4 MG TBPK tablet    Sig: As directed    Dispense:  21 tablet    Refill:  0   fluticasone-salmeterol (ADVAIR) 100-50 MCG/ACT AEPB    Sig: Inhale 1 puff into the lungs 2 (two) times daily.    Dispense:  1 each    Refill:  3      Follow-up: Return in about 3 months (around 01/25/2023) for a follow-up visit.  Walker Kehr, MD

## 2022-10-26 NOTE — Assessment & Plan Note (Addendum)
C/o chest congestion, cough: was using a jack hammer on quarts - breathing dust... Use a face mask, filters, face shield  Medrol pac Advair bid

## 2022-10-26 NOTE — Telephone Encounter (Signed)
[facility] called  to advise insurance does not cover Suprep can substitute for Clenpiq.

## 2022-11-01 ENCOUNTER — Ambulatory Visit (INDEPENDENT_AMBULATORY_CARE_PROVIDER_SITE_OTHER): Payer: Medicare HMO

## 2022-11-01 VITALS — Ht 72.0 in | Wt 233.0 lb

## 2022-11-01 DIAGNOSIS — Z0000 Encounter for general adult medical examination without abnormal findings: Secondary | ICD-10-CM

## 2022-11-01 NOTE — Patient Instructions (Signed)
[name] , Thank you for taking time to come for your Medicare Wellness Visit. I appreciate your ongoing commitment to your health goals. Please review the following plan we discussed and let me know if I can assist you in the future.   These are the goals we discussed:  Goals      Client understands the importance of follow-up with providers by attending scheduled visits        This is a list of the screening recommended for you and due dates:  Health Maintenance  Topic Date Due   Colon Cancer Screening  12/05/2021   COVID-19 Vaccine (4 - 2023-24 season) 07/15/2022   Zoster (Shingles) Vaccine (2 of 2) 08/05/2022   Medicare Annual Wellness Visit  11/02/2023   DTaP/Tdap/Td vaccine (3 - Td or Tdap) 08/11/2030   Pneumonia Vaccine  Completed   Flu Shot  Completed   Hepatitis C Screening: USPSTF Recommendation to screen - Ages 18-79 yo.  Completed   HPV Vaccine  Aged Out    Advanced directives: No; Advance directive discussed with you today. Even though you declined this today please call our office should you change your mind and we can give you the proper paperwork for you to fill out.  Conditions/risks identified: Yes  Next appointment: Follow up in one year for your annual wellness visit.   Preventive Care 8 Years and Older, Male  Preventive care refers to lifestyle choices and visits with your health care provider that can promote health and wellness. What does preventive care include? A yearly physical exam. This is also called an annual well check. Dental exams once or twice a year. Routine eye exams. Ask your health care provider how often you should have your eyes checked. Personal lifestyle choices, including: Daily care of your teeth and gums. Regular physical activity. Eating a healthy diet. Avoiding tobacco and drug use. Limiting alcohol use. Practicing safe sex. Taking low doses of aspirin every day. Taking vitamin and mineral supplements as recommended by your  health care provider. What happens during an annual well check? The services and screenings done by your health care provider during your annual well check will depend on your age, overall health, lifestyle risk factors, and family history of disease. Counseling  Your health care provider may ask you questions about your: Alcohol use. Tobacco use. Drug use. Emotional well-being. Home and relationship well-being. Sexual activity. Eating habits. History of falls. Memory and ability to understand (cognition). Work and work Statistician. Screening  You may have the following tests or measurements: Height, weight, and BMI. Blood pressure. Lipid and cholesterol levels. These may be checked every 5 years, or more frequently if you are over 4 years old. Skin check. Lung cancer screening. You may have this screening every year starting at age 56 if you have a 30-pack-year history of smoking and currently smoke or have quit within the past 15 years. Fecal occult blood test (FOBT) of the stool. You may have this test every year starting at age 33. Flexible sigmoidoscopy or colonoscopy. You may have a sigmoidoscopy every 5 years or a colonoscopy every 10 years starting at age 59. Prostate cancer screening. Recommendations will vary depending on your family history and other risks. Hepatitis C blood test. Hepatitis B blood test. Sexually transmitted disease (STD) testing. Diabetes screening. This is done by checking your blood sugar (glucose) after you have not eaten for a while (fasting). You may have this done every 1-3 years. Abdominal aortic aneurysm (AAA) screening.  You may need this if you are a current or former smoker. Osteoporosis. You may be screened starting at age 39 if you are at high risk. Talk with your health care provider about your test results, treatment options, and if necessary, the need for more tests. Vaccines  Your health care provider may recommend certain vaccines, such  as: Influenza vaccine. This is recommended every year. Tetanus, diphtheria, and acellular pertussis (Tdap, Td) vaccine. You may need a Td booster every 10 years. Zoster vaccine. You may need this after age 53. Pneumococcal 13-valent conjugate (PCV13) vaccine. One dose is recommended after age 70. Pneumococcal polysaccharide (PPSV23) vaccine. One dose is recommended after age 50. Talk to your health care provider about which screenings and vaccines you need and how often you need them. This information is not intended to replace advice given to you by your health care provider. Make sure you discuss any questions you have with your health care provider. Document Released: 11/27/2015 Document Revised: 07/20/2016 Document Reviewed: 09/01/2015 Elsevier Interactive Patient Education  2017 [place] Prevention in the Home Falls can cause injuries. They can happen to people of all ages. There are many things you can do to make your home safe and to help prevent falls. What can I do on the outside of my home? Regularly fix the edges of walkways and driveways and fix any cracks. Remove anything that might make you trip as you walk through a door, such as a raised step or threshold. Trim any bushes or trees on the path to your home. Use bright outdoor lighting. Clear any walking paths of anything that might make someone trip, such as rocks or tools. Regularly check to see if handrails are loose or broken. Make sure that both sides of any steps have handrails. Any raised decks and porches should have guardrails on the edges. Have any leaves, snow, or ice cleared regularly. Use sand or salt on walking paths during winter. Clean up any spills in your garage right away. This includes oil or grease spills. What can I do in the bathroom? Use night lights. Install grab bars by the toilet and in the tub and shower. Do not use towel bars as grab bars. Use non-skid mats or decals in the tub or  shower. If you need to sit down in the shower, use a plastic, non-slip stool. Keep the floor dry. Clean up any water that spills on the floor as soon as it happens. Remove soap buildup in the tub or shower regularly. Attach bath mats securely with double-sided non-slip rug tape. Do not have throw rugs and other things on the floor that can make you trip. What can I do in the bedroom? Use night lights. Make sure that you have a light by your bed that is easy to reach. Do not use any sheets or blankets that are too big for your bed. They should not hang down onto the floor. Have a firm chair that has side arms. You can use this for support while you get dressed. Do not have throw rugs and other things on the floor that can make you trip. What can I do in the kitchen? Clean up any spills right away. Avoid walking on wet floors. Keep items that you use a lot in easy-to-reach places. If you need to reach something above you, use a strong step stool that has a grab bar. Keep electrical cords out of the way. Do not use floor polish or wax  that makes floors slippery. If you must use wax, use non-skid floor wax. Do not have throw rugs and other things on the floor that can make you trip. What can I do with my stairs? Do not leave any items on the stairs. Make sure that there are handrails on both sides of the stairs and use them. Fix handrails that are broken or loose. Make sure that handrails are as long as the stairways. Check any carpeting to make sure that it is firmly attached to the stairs. Fix any carpet that is loose or worn. Avoid having throw rugs at the top or bottom of the stairs. If you do have throw rugs, attach them to the floor with carpet tape. Make sure that you have a light switch at the top of the stairs and the bottom of the stairs. If you do not have them, ask someone to add them for you. What else can I do to help prevent falls? Wear shoes that: Do not have high heels. Have  rubber bottoms. Are comfortable and fit you well. Are closed at the toe. Do not wear sandals. If you use a stepladder: Make sure that it is fully opened. Do not climb a closed stepladder. Make sure that both sides of the stepladder are locked into place. Ask someone to hold it for you, if possible. Clearly mark and make sure that you can see: Any grab bars or handrails. First and last steps. Where the edge of each step is. Use tools that help you move around (mobility aids) if they are needed. These include: Canes. Walkers. Scooters. Crutches. Turn on the lights when you go into a dark area. Replace any light bulbs as soon as they burn out. Set up your furniture so you have a clear path. Avoid moving your furniture around. If any of your floors are uneven, fix them. If there are any pets around you, be aware of where they are. Review your medicines with your doctor. Some medicines can make you feel dizzy. This can increase your chance of falling. Ask your doctor what other things that you can do to help prevent falls. This information is not intended to replace advice given to you by your health care provider. Make sure you discuss any questions you have with your health care provider. Document Released: 08/27/2009 Document Revised: 04/07/2016 Document Reviewed: 12/05/2014 Elsevier Interactive Patient Education  2017 [employer].

## 2022-11-01 NOTE — Progress Notes (Addendum)
Virtual Visit via Telephone Note  I connected with  [name] on 11/01/22 at  8:45 AM EST by telephone and verified that I am speaking with the correct person using two identifiers.  Location: Patient: Home Provider: [facility] [place] Persons participating in the virtual visit: [place]   I discussed the limitations, risks, security and privacy concerns of performing an evaluation and management service by telephone and the availability of in person appointments. The patient expressed understanding and agreed to proceed.  Interactive audio and video telecommunications were attempted between this nurse and patient, however failed, due to patient having technical difficulties OR patient did not have access to video capability.  We continued and completed visit with audio only.  Some vital signs may be absent or patient reported.   Sheral Flow, LPN  Subjective:   [name] is a 71 y.o. male who presents for Medicare Annual/Subsequent preventive examination.  Review of Systems     Cardiac Risk Factors include: advanced age (>14mn, >>26women);hypertension;dyslipidemia;family history of premature cardiovascular disease;male gender;obesity (BMI >30kg/m2)     Objective:    Today's Vitals   11/01/22 0847  Weight: 233 lb (105.7 kg)  Height: 6' (1.829 m)  PainSc: 0-No pain   Body mass index is 31.6 kg/m.     11/01/2022    8:52 AM 10/29/2021   11:26 AM 08/25/2020   10:14 AM 10/04/2019    9:45 AM 04/15/2019    4:18 PM 12/25/2017   11:30 AM 08/31/2017    8:54 AM  Advanced Directives  Does Patient Have a Medical Advance Directive? No No No No No No No  Would patient like information on creating a medical advance directive? No - Patient declined No - Patient declined No - Patient declined Yes (ED - Information included in AVS) Yes (ED - Information included in AVS) Yes (ED - Information included in AVS) No - Patient declined    Current  Medications (verified) Outpatient Encounter Medications as of 11/01/2022  Medication Sig   acetaminophen (TYLENOL) 500 MG tablet Take 500 mg by mouth every 6 (six) hours as needed for headache.   ALPRAZolam (XANAX) 0.5 MG tablet Take 1 tablet by mouth twice daily as needed for anxiety   aspirin 81 MG tablet Take 81 mg by mouth at bedtime.   Cyanocobalamin (VITAMIN B-12) 2500 MCG SUBL Place 5,000 mcg under the tongue daily.   escitalopram (LEXAPRO) 10 MG tablet Take 1 tablet by mouth once daily   Evolocumab (REPATHA SURECLICK) 1119MG/ML SOAJ Inject 1 mL into the skin every 14 (fourteen) days.   fluticasone (FLONASE) 50 MCG/ACT nasal spray Use 1 spray(s) in each nostril once daily   fluticasone-salmeterol (ADVAIR) 100-50 MCG/ACT AEPB Inhale 1 puff into the lungs 2 (two) times daily.   loratadine (EQ LORATADINE) 10 MG tablet TAKE 1 TABLET BY MOUTH ONCE DAILY AS NEEDED FOR ALLERGIES   meloxicam (MOBIC) 7.5 MG tablet Take 2 tablets by mouth once daily   methylPREDNISolone (MEDROL DOSEPAK) 4 MG TBPK tablet As directed   Multiple Vitamin (MULTIVITAMIN WITH MINERALS) TABS tablet Take 1 tablet by mouth daily.   nitroGLYCERIN (NITROSTAT) 0.4 MG SL tablet Place 1 tablet (0.4 mg total) under the tongue every 5 (five) minutes as needed for chest pain.   OVER THE COUNTER MEDICATION Take 3 capsules by mouth every morning. Flexnow   pantoprazole (PROTONIX) 40 MG tablet Take 1 tablet by mouth twice daily   traMADol (ULTRAM) 50 MG tablet TAKE 1  TO 2 TABLETS BY MOUTH THREE TIMES DAILY AS NEEDED   valACYclovir (VALTREX) 500 MG tablet Take 1 tablet by mouth once daily   No facility-administered encounter medications on file as of 11/01/2022.    Allergies (verified) Hytrin [terazosin], Icosapent ethyl, Levaquin [levofloxacin in d5w], Lipitor [atorvastatin], Nexletol [bempedoic acid], Penicillins, Reglan [metoclopramide], and Xanax [alprazolam]   History: Past Medical History:  Diagnosis Date   Actinic  keratosis    Acute sinusitis 06/21/2010   9/14, 10/19    Allergy    Anxiety 08/24/2017   Chronic  Worse in 10/18 Xanax prn - d/c  Potential benefits of a long term benzodiazepines  use as well as potential risks  and complications were explained to the patient and were aknowledged. 11/18 Lexapro     Arthralgia 04/14/2017   2018 2019 Rheum consult; Gluten free trial Dr Estanislado Pandy 12[facility] Depo-medrol IM Increase Tramadol w/caution  Potential benefits of a long term opioids use as well as potential risks (i.e. addiction risk, apnea etc) and complications (i.e. Somnolence, constipation and others) were explained to the patient and were aknowledged.   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 Pravastatin - pt stopped due to side effects 1/21 Zetia - causing pain too Krill oil d/c.  Repatha option discussed.  9/21 Vascepa, Zetia   Chest pain 12/07/2020   Chondrocalcinosis 07/06/2018   Chronic sinusitis 08/12/2013    Sinus congestion, SOB - better after surgery - nov 2014 9/14 worse CT IMPRESSION:  Minimal sinus mucosal thickening, primarily in the frontal recesses.  Small right maxillary mucous retention cyst. No CT evidence of acute  sinusitis.  Electronically Signed  By: Lars Pinks M.D.  On: 08/15/2013 09:19  Doxy x 4 wks   Colonic polyp    COLONIC POLYPS, HX OF    CONTACT DERMATITIS DUE TO SOLVENTS    DDD (degenerative disc disease), cervical 07/06/2018   DDD (degenerative disc disease),  lumbar 07/06/2018   and facet joint arthropathy   Dizzinesses 07/18/2017   9/18 I suggested a brain MRI to r/o acustic neuroma etc: he will think about it and check on cost ENT ref suggested   DOE (dyspnea on exertion) 07/25/2012   related to eating shrimp   Dyslipidemia 06/21/2013   Chronic  Elev TG, low HDL, nl LDL Pravastatin - pt stopped due to side effects 1/21 Repatha option discussed. On Zetia - c/o side effeects 9/21 Vascepa, Zetia   Dyspnea 07/25/2012   related to eating shrimp   Essential hypertension 07/29/2021   Fatigue 06/09/2015   2016 no OSA  2018 much better (60%) off Reglan.    Folliculitis 36/46/8032   GERD 05/09/2008   Chronic  -- Protonix 5/18 We discussed his GERD/Barrett's treated at [facility] (Dr Ferdinand Lango). Reglan was d/c'd due to CP. He was dx'd w/pancreatitis and started on Zenpep He had an abd CT, [place], EGD and colonoscopy. He would like to have a 2nd opinion  here - will refer   GERD (gastroesophageal reflux disease)    Hand eczema 07/07/2015   8/16 dyshidrotic eczema B    HAND PAIN    Heart murmur    HERPES, GENITAL NOS    Hyperglycemia 06/09/2015   Mild     Hypogonadism in male 04/25/2018   Declined testosterone Rx   Impacted cerumen of left ear 12/24/2020   Ingrowing toenail of left foot 07/25/2012   is resolved now 07-25-12   Insomnia 04/18/2022   6/23 Worse due to pain. Try Flexeril at hs.   KNEE PAIN    Laryngopharyngeal reflux (LPR) 10/02/2018   Localized swelling, mass or lump of neck 09/05/2018   Low back pain 06/09/2015   Per Dr Alvan Dame 3/17 - planning to see Dr Annette Stable Body pains are much better (60%) off Reglan. Off Tramadol 2019 Rheum consult; Gluten free trial Tramadol  Potential benefits of a long term opioids use as well as potential risks (i.e. addiction risk, apnea etc) and complications (i.e. Somnolence, constipation and others) were explained to the patient and were aknowledged.   Nausea 07/18/2017   Chronic w/dizziness I suggested a brain  MRI to r/o acustic neuroma etc: he will think about it and check on cost 9/18   Neoplasm of uncertain behavior of skin    OA (osteoarthritis) of knee    Obesity (BMI 30.0-34.9) 01/07/2021   ONYCHOMYCOSIS    OSA (obstructive sleep apnea) 08/21/2013   2014 no need for CPAP    Preop exam for internal medicine    Primary osteoarthritis of both hands 11/21/2008   Chronic and severe Tramadol  Potential benefits of a long term opioids use as well as potential risks (i.e. addiction risk, apnea etc) and complications (i.e. Somnolence, constipation and others) were explained to the patient and were aknowledged.   10/18 Body pains are much better (60%) off Reglan. 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 Xanax prn - d/c  Potential benefits of a long term benzodiazepines  use as well as potential risks  and complications were explained to the patient and were aknowledged. Neurol ref - Dr Tat:   "Hi   Umbilical hernia 07/15/5175   Relapsed Surgical ref   Upper respiratory infection 07/27/2018   9/19, 12/21   Well adult exam    Past Surgical History:  Procedure Laterality Date   COLONOSCOPY  04/24/2019   JOINT REPLACEMENT  9/13   L TKR   KNEE ARTHROSCOPY     Left   LEFT HEART CATH AND CORONARY ANGIOGRAPHY N/A 08/31/2017   Procedure: LEFT HEART CATH AND CORONARY ANGIOGRAPHY;  Surgeon: Nelva Bush, MD;  Location: [facility];   Service: Cardiovascular;  Laterality: N/A;   TOTAL KNEE ARTHROPLASTY  07/31/2012   Procedure: TOTAL KNEE ARTHROPLASTY;  Surgeon: Sydnee Cabal, MD;  Location: [facility];  Service: Orthopedics;  Laterality: Left;   UMBILICAL HERNIA REPAIR  07-25-12   10 yrs ago   UPPER GASTROINTESTINAL ENDOSCOPY  04/24/2019   Family History  Problem Relation Age of Onset   Diabetes Mother 69   Heart disease Father 51       CAD, MI, CHF   Lung cancer Brother        smoker   Coronary artery disease Other    Colon polyps Brother    Heart disease Brother 57       CHF   Colon cancer Brother    Healthy Daughter    Stomach cancer Neg Hx    Rectal cancer Neg Hx    Esophageal cancer Neg Hx    Social History   Socioeconomic History   Marital status: Married    Spouse name: Not on file   Number of children: 2   Years of education: Not on file   Highest education level: Not on file  Occupational History   Occupation: retired  Tobacco Use   Smoking status: Former    Types: Cigars    Quit date: 08/30/2007    Years since quitting: 15.1   Smokeless tobacco: Never   Tobacco comments:    occ. cigar  Vaping Use   Vaping Use: Never used  Substance and Sexual Activity   Alcohol use: Yes    Comment: occ.    Drug use: Yes    Types: Marijuana    Comment: last marijuana this am, 2 "hits" per patient   Sexual activity: Yes    Partners: Female    Birth control/protection: None  Other Topics Concern   Not on file  Social History Narrative   Not on file   Social Determinants of Health   Financial Resource Strain: Low Risk  (11/01/2022)   Overall Financial Resource Strain (CARDIA)    Difficulty of Paying Living Expenses: Not hard at all  Food Insecurity: No Food Insecurity (11/01/2022)   Hunger Vital Sign    Worried About Running Out of Food in the Last Year: Never true    [place] in the Last Year: Never true  Transportation Needs: No Transportation Needs (11/01/2022)   PRAPARE -  Hydrologist (Medical): No    Lack of Transportation (Non-Medical): No  Physical Activity: Sufficiently Active (11/01/2022)   Exercise Vital Sign    Days of Exercise per Week: 5 days    Minutes of Exercise per Session: 30 min  Stress: No Stress Concern Present (11/01/2022)   [place]    Feeling of Stress : Not at all  Social Connections: Moderately Integrated (11/01/2022)   Social Connection and Isolation Panel [NHANES]    Frequency of Communication with Friends and Family: Twice a week    Frequency of Social Gatherings with Friends and Family: Twice a week    Attends Religious Services: 1 to 4 times per year    Active Member of [employer] or Organizations: No    Attends Music therapist: Never    Marital Status: Married    Tobacco Counseling Counseling given: Not Answered Tobacco comments: occ. cigar   Clinical Intake:  Pre-visit preparation completed: Yes  Pain : No/denies pain Pain Score: 0-No pain     BMI - recorded: 31.6 Nutritional Risks: None Diabetes: No  How often do you need to have someone help you when you read instructions, pamphlets, or other written materials from your doctor  or pharmacy?: 1 - Never What is the last grade level you completed in school?: HSG  Diabetic? No  Interpreter Needed?: No  Information entered by :: Lisette Abu, LPN.   Activities of Daily Living    11/01/2022    8:57 AM  In your present state of health, do you have any difficulty performing the following activities:  Hearing? 0  Vision? 0  Difficulty concentrating or making decisions? 0  Walking or climbing stairs? 0  Dressing or bathing? 0  Doing errands, shopping? 0  Preparing Food and eating ? N  Using the Toilet? N  In the past six months, have you accidently leaked urine? N  Do you have problems with loss of bowel control? N  Managing your Medications? N   Managing your Finances? N  Housekeeping or managing your Housekeeping? N    Patient Care Team: Plotnikov, Evie Lacks, MD as PCP - General Revankar, Reita Cliche, MD as PCP - Cardiology (Cardiology) Josue Hector, MD (Cardiology) Sydnee Cabal, MD (Orthopedic Surgery) Irene Shipper, MD as Consulting Physician (Gastroenterology) Revankar, Reita Cliche, MD as Consulting Physician (Cardiology) Tat, Eustace Quail, DO as Consulting Physician (Neurology)  Indicate any recent Medical Services you may have received from other than [facility] providers in the past year (date may be approximate).     Assessment:   This is a routine wellness examination for [name].  Hearing/Vision screen Hearing Screening - Comments:: Denies hearing difficulties   Vision Screening - Comments:: Wears rx glasses - up to date with routine eye exams with Cleon Gustin, OD.   Dietary issues and exercise activities discussed: Current Exercise Habits: Home exercise routine, Type of exercise: walking, Time (Minutes): 30, Frequency (Times/Week): 5, Weekly Exercise (Minutes/Week): 150, Intensity: Moderate, Exercise limited by: orthopedic condition(s)   Goals Addressed             This Visit's Progress    Client understands the importance of follow-up with providers by attending scheduled visits        Depression Screen    11/01/2022    8:53 AM 10/26/2022    8:06 AM 07/26/2022    8:03 AM 10/29/2021   11:26 AM 10/29/2021   11:24 AM 08/25/2020   10:13 AM 05/06/2020   11:22 AM  PHQ 2/9 Scores  PHQ - 2 Score 0 0 2 0 0 0 0  PHQ- 9 Score '5 5 6        '$ Fall Risk    11/01/2022    8:53 AM 10/26/2022    8:05 AM 07/26/2022    8:03 AM 10/29/2021   11:26 AM 08/25/2020   10:14 AM  Fall Risk   Falls in the past year? 0 0 0 0 1  Number falls in past yr: 0 0 0 0 0  Injury with Fall? 0 0 0 0 0  Risk for fall due to : No Fall Risks No Fall Risks No Fall Risks  No Fall Risks  Follow up Falls prevention discussed   Falls evaluation  completed Falls evaluation completed    FALL RISK PREVENTION PERTAINING TO THE HOME:  Any stairs in or around the home? Yes  If so, are there any without handrails? No  Home free of loose throw rugs in walkways, pet beds, electrical cords, etc? Yes  Adequate lighting in your home to reduce risk of falls? Yes   ASSISTIVE DEVICES UTILIZED TO PREVENT FALLS:  Life alert? No  Use of a cane, walker or w/c? No  Grab  bars in the bathroom? No  Shower chair or bench in shower? No  Elevated toilet seat or a handicapped toilet? No   TIMED UP AND GO:  Was the test performed? No . Phone Visit   Cognitive Function:        11/01/2022    8:53 AM  6CIT Screen  What Year? 0 points  What month? 0 points  What time? 0 points  Count back from 20 0 points  Months in reverse 0 points  Repeat phrase 0 points  Total Score 0 points    Immunizations Immunization History  Administered Date(s) Administered   Fluad Quad(high Dose 65+) 07/25/2019, 08/11/2020, 08/13/2021, 07/26/2022   Influenza Split 07/20/2012   Influenza Whole 08/15/2011   Influenza, High Dose Seasonal PF 07/27/2016, 07/27/2018   Influenza,inj,Quad PF,6+ Mos 08/12/2013, 07/18/2017   Influenza-Unspecified 07/15/2014, 10/15/2015, 08/02/2016   PFIZER(Purple Top)SARS-COV-2 Vaccination 01/16/2020, 02/12/2020, 10/05/2020   Pneumococcal Conjugate-13 11/01/2018   Pneumococcal Polysaccharide-23 08/11/2020   Td 01/28/2010   Tdap 08/11/2020   Zoster Recombinat (Shingrix) 06/10/2022   Zoster, Live 12/23/2013    TDAP status: Up to date  Flu Vaccine status: Up to date  Pneumococcal vaccine status: Up to date  Covid-19 vaccine status: Completed vaccines  Qualifies for Shingles Vaccine? Yes   Zostavax completed Yes   Shingrix Completed?: No.    Education has been provided regarding the importance of this vaccine. Patient has been advised to call insurance company to determine out of pocket expense if they have not yet received  this vaccine. Advised may also receive vaccine at local pharmacy or Health Dept. Verbalized acceptance and understanding.  Screening Tests Health Maintenance  Topic Date Due   COLONOSCOPY (Pts 45-47yr Insurance coverage will need to be confirmed)  12/05/2021   COVID-19 Vaccine (4 - 2023-24 season) 07/15/2022   Zoster Vaccines- Shingrix (2 of 2) 08/05/2022   Medicare Annual Wellness (AWV)  11/02/2023   DTaP/Tdap/Td (3 - Td or Tdap) 08/11/2030   Pneumonia Vaccine 69 Years old  Completed   INFLUENZA VACCINE  Completed   Hepatitis C Screening  Completed   HPV VACCINES  Aged Out    Health Maintenance  Health Maintenance Due  Topic Date Due   COLONOSCOPY (Pts 45-426yrInsurance coverage will need to be confirmed)  12/05/2021   COVID-19 Vaccine (4 - 2023-24 season) 07/15/2022   Zoster Vaccines- Shingrix (2 of 2) 08/05/2022    Colorectal cancer screening: Scheduled for 11/30/2022 with Dr. JoScarlette ShortsLung Cancer Screening: (Low Dose CT Chest recommended if Age 69-80ears, 30 pack-year currently smoking OR have quit w/in 15years.) does not qualify.   Lung Cancer Screening Referral: no  Additional Screening:  Hepatitis C Screening: does qualify; Completed 06/16/2016  Vision Screening: Recommended annual ophthalmology exams for early detection of glaucoma and other disorders of the eye. Is the patient up to date with their annual eye exam?  Yes  Who is the provider or what is the name of the office in which the patient attends annual eye exams? [facility]. If pt is not established with a provider, would they like to be referred to a provider to establish care? No .   Dental Screening: Recommended annual dental exams for proper oral hygiene  Community Resource Referral / Chronic Care Management: CRR required this visit?  No   CCM required this visit?  No      Plan:     I have personally reviewed and noted the following in the patient's chart:  Medical and social  history Use of alcohol, tobacco or illicit drugs  Current medications and supplements including opioid prescriptions. Patient is not currently taking opioid prescriptions. Functional ability and status Nutritional status Physical activity Advanced directives List of other physicians Hospitalizations, surgeries, and ER visits in previous 12 months Vitals Screenings to include cognitive, depression, and falls Referrals and appointments  In addition, I have reviewed and discussed with patient certain preventive protocols, quality metrics, and best practice recommendations. A written personalized care plan for preventive services as well as general preventive health recommendations were provided to patient.     Sheral Flow, LPN   27/01/5008   Nurse Notes: N/A   Medical screening examination/treatment/procedure(s) were performed by non-physician practitioner and as supervising physician I was immediately available for consultation/collaboration.  I agree with above. Lew Dawes, MD

## 2022-11-23 ENCOUNTER — Encounter: Payer: Self-pay | Admitting: Internal Medicine

## 2022-11-23 ENCOUNTER — Telehealth: Payer: Self-pay | Admitting: Internal Medicine

## 2022-11-23 ENCOUNTER — Other Ambulatory Visit: Payer: Self-pay | Admitting: Internal Medicine

## 2022-11-23 NOTE — Telephone Encounter (Signed)
Relayed information to patient.  He was appreciative

## 2022-11-23 NOTE — Telephone Encounter (Signed)
Inbound call from patient stating that he is scheduled to have a colonoscopy and endoscopy on 1/17 at 3:00. Patient stated that he has been having congestion and a cough for the last month and went to see Dr. Alain Marion for it and was given medication but it seems to not be going away. Patient is requesting a call back to discuss if he needs to reschedule or if he is okay to still have procedures. Please advise.

## 2022-11-23 NOTE — Telephone Encounter (Signed)
Spoke with patient - he is not running a fever and is not contagious - has ongoing sinus issues.  He is concerned because of significant coughing spells he is having - worries he might have a coughing spell during the procedure.  He wants to have the procedure but just wanted to express his concerns - he states cough medicine is not significantly helping.  Please advise.

## 2022-11-23 NOTE — Telephone Encounter (Signed)
I appreciate the information that he is provided. Let him know that I think it is okay to proceed based on the information given Thanks

## 2022-11-25 ENCOUNTER — Telehealth: Payer: Self-pay | Admitting: Internal Medicine

## 2022-11-25 NOTE — Telephone Encounter (Signed)
Patient called, stated he went to go pick up his medication for his procedure. Patient stated they gave him a big jug and he is wondering if the prep instructions would be the same. Please advise.

## 2022-11-25 NOTE — Telephone Encounter (Signed)
I spoke with [facility] - there was a misunderstanding about the prep and they gave patient Golytely.  I spoke with patient who did not want to drink such a large volume.  I offered Sutab, because I had a sample in the office.  I redid instructions and patient agreed to come up to the office Monday to get sample and instructions.

## 2022-11-29 ENCOUNTER — Other Ambulatory Visit: Payer: Self-pay | Admitting: Internal Medicine

## 2022-11-30 ENCOUNTER — Ambulatory Visit (AMBULATORY_SURGERY_CENTER): Payer: Medicare HMO | Admitting: Internal Medicine

## 2022-11-30 ENCOUNTER — Encounter: Payer: Self-pay | Admitting: Internal Medicine

## 2022-11-30 VITALS — BP 135/81 | HR 60 | Temp 98.6°F | Resp 21 | Ht 72.0 in | Wt 233.0 lb

## 2022-11-30 DIAGNOSIS — D122 Benign neoplasm of ascending colon: Secondary | ICD-10-CM | POA: Diagnosis not present

## 2022-11-30 DIAGNOSIS — Z86010 Personal history of colonic polyps: Secondary | ICD-10-CM | POA: Diagnosis not present

## 2022-11-30 DIAGNOSIS — K222 Esophageal obstruction: Secondary | ICD-10-CM

## 2022-11-30 DIAGNOSIS — K219 Gastro-esophageal reflux disease without esophagitis: Secondary | ICD-10-CM

## 2022-11-30 DIAGNOSIS — D125 Benign neoplasm of sigmoid colon: Secondary | ICD-10-CM

## 2022-11-30 DIAGNOSIS — K639 Disease of intestine, unspecified: Secondary | ICD-10-CM | POA: Diagnosis not present

## 2022-11-30 DIAGNOSIS — Z09 Encounter for follow-up examination after completed treatment for conditions other than malignant neoplasm: Secondary | ICD-10-CM | POA: Diagnosis not present

## 2022-11-30 DIAGNOSIS — R1310 Dysphagia, unspecified: Secondary | ICD-10-CM | POA: Diagnosis not present

## 2022-11-30 MED ORDER — SODIUM CHLORIDE 0.9 % IV SOLN: 500.0000 mL | Freq: Once | INTRAVENOUS | Status: DC

## 2022-11-30 NOTE — Progress Notes (Signed)
HISTORY OF PRESENT ILLNESS:   [name] is a 72 y.o. male with multiple significant medical problems as listed below who presents today regarding surveillance colonoscopy and ongoing management of chronic GERD.  He was last evaluated in the office August 2018.  See that dictation for details.  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  Follow-up colonoscopy January 2021 revealed a diminutive adenoma which was removed.  Biopsies from the polypectomy scar were negative for residual adenoma.  Follow-up in 2 years recommended.  Patient denies lower abdominal complaints.  He will have occasional diarrhea and irritated hemorrhoids.  Terms of his reflux, he takes pantoprazole 40 mg twice daily.  Lower dosages resulted in breakthrough symptoms.  He does report some mild dysphagia for which she is interested in endoscopic assessment.  His last upper endoscopy was performed June 2020.  Mild esophagitis.  No endoscopic evidence of Barrett's.  Review of outside blood work from August 2023 shows unremarkable comprehensive metabolic panel with normal liver test.  Normal CBC with hemoglobin 15.0.  Abdominal ultrasound November 2020 revealed fatty liver.  He does have mild to moderate aortic stenosis for which she has recently been seen by cardiology including echo with normal EF and chest CT.   REVIEW OF SYSTEMS:   All non-GI ROS negative as otherwise stated in the HPI except for sinus and allergy, arthritis, back pain, cough, fatigue, hearing problems, heart murmur, sleeping problems, excessive urination, shortness of breath       Past Medical History:  Diagnosis Date   Actinic keratosis     Acute sinusitis 06/21/2010    9/14, 10/19    Allergy     Anxiety 08/24/2017    Chronic  Worse in 10/18 Xanax prn - d/c  Potential benefits of a long term benzodiazepines  use as well as potential risks  and complications were explained  to the patient and were aknowledged. 11/18 Lexapro     Arthralgia 04/14/2017    2018 2019 Rheum consult; Gluten free trial Dr Estanislado Pandy 12/[facility] Depo-medrol IM Increase Tramadol w/caution  Potential benefits of a long term opioids use as well as potential risks (i.e. addiction risk, apnea etc) and complications (i.e. Somnolence, constipation and others) were explained to the patient and were aknowledged.   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 Pravastatin - pt stopped due to side effects 1/21 Zetia - causing pain too Krill oil d/c.  Repatha option discussed.  9/21 Vascepa, Zetia   Chest pain 12/07/2020   Chondrocalcinosis 07/06/2018   Chronic sinusitis 08/12/2013     Sinus congestion, SOB - better after surgery - nov 2014 9/14 worse CT IMPRESSION:  Minimal sinus mucosal thickening, primarily in the frontal recesses.  Small right maxillary mucous retention cyst. No CT evidence of acute  sinusitis.  Electronically Signed  By:  Lars Pinks M.D.  On: 08/15/2013 09:19  Doxy x 4 wks   Colonic polyp     COLONIC POLYPS, HX OF     CONTACT DERMATITIS DUE TO SOLVENTS     DDD (degenerative disc disease), cervical 07/06/2018   DDD (degenerative disc disease), lumbar 07/06/2018    and facet joint arthropathy   Dizzinesses 07/18/2017    9/18 I suggested a brain MRI to r/o acustic neuroma etc: he will think about it and check on cost ENT ref suggested   DOE (dyspnea on exertion) 07/25/2012    related  to eating shrimp   Dyslipidemia 06/21/2013    Chronic  Elev TG, low HDL, nl LDL Pravastatin - pt stopped due to side effects 1/21 Repatha option discussed. On Zetia - c/o side effeects 9/21 Vascepa, Zetia   Dyspnea 07/25/2012    related to eating shrimp   Essential hypertension 07/29/2021   Fatigue 06/09/2015    2016 no OSA  2018 much better (60%) off Reglan.    Folliculitis 37/62/8315   GERD 05/09/2008    Chronic  -- Protonix 5/18 We discussed his GERD/Barrett's treated at [facility] (Dr Ferdinand Lango). Reglan was d/c'd due to CP. He was dx'd w/pancreatitis and started on Zenpep He had an abd CT, [place], EGD and colonoscopy. He would like to have a 2nd opinion here - will refer   GERD (gastroesophageal reflux disease)     Hand eczema 07/07/2015    8/16 dyshidrotic eczema B    HAND PAIN     Heart murmur     HERPES, GENITAL NOS     Hyperglycemia 06/09/2015    Mild     Hypogonadism in male 04/25/2018    Declined testosterone Rx   Impacted cerumen of left ear 12/24/2020   Ingrowing toenail of left foot 07/25/2012    is resolved now 07-25-12   Insomnia 04/18/2022    6/23 Worse due to pain. Try Flexeril at hs.   KNEE PAIN     Laryngopharyngeal reflux (LPR) 10/02/2018   Localized swelling, mass or lump of neck 09/05/2018   Low back pain 06/09/2015    Per Dr Alvan Dame 3/17 - planning to see Dr Annette Stable Body pains are much better (60%) off Reglan. Off Tramadol 2019 Rheum consult; Gluten free trial Tramadol  Potential benefits of a long term opioids use as well as potential risks (i.e. addiction risk, apnea etc) and complications (i.e. Somnolence, constipation and others) were explained to the patient and were aknowledged.   Nausea 07/18/2017    Chronic w/dizziness I suggested a brain MRI to r/o acustic neuroma etc: he will think about it and check on cost 9/18   Neoplasm of uncertain behavior of skin     OA (osteoarthritis) of knee     Obesity (BMI 30.0-34.9) 01/07/2021   ONYCHOMYCOSIS     OSA  (obstructive sleep apnea) 08/21/2013    2014 no need for CPAP    Preop exam for internal medicine     Primary osteoarthritis of both hands 11/21/2008    Chronic and severe Tramadol  Potential benefits of a long term opioids use as well as potential risks (i.e. addiction risk, apnea etc) and complications (i.e. Somnolence, constipation and others) were explained to the patient and were aknowledged.   10/18 Body pains are much better (60%) off Reglan. Off Tramadol   Rash and nonspecific skin eruption 07/27/2018    2019 x1 year  Gluten free trial   Rosacea 11/19/2018   S/P TKR (  total knee replacement) 12/21/2012    L 9/14 - post-op swelling and pain long term - not better Applying for SS disability    Scrotal mass 06/09/2015    L side ?varicocele 2016    SINUSITIS, ACUTE     Tinnitus of both ears 12/13/2018    2020 S/p ENT eval x2   Tremor 09/15/2017    10/18 sx's the pt is attributing to Reglan withdrawal (he had his last Reglan dose in early September 2018): tremor, twitching, anxiety, depression, fatigue, pounding heart, blinking, lip smacking etc... Xanax prn - d/c  Potential benefits of a long term benzodiazepines  use as well as potential risks  and complications were explained to the patient and were aknowledged. Neurol ref - Dr Tat:   "Hi   Umbilical hernia 6/76/1948    Relapsed Surgical ref   Upper respiratory infection 07/27/2018    9/19, 12/21   Well adult exam             Past Surgical History:  Procedure Laterality Date   COLONOSCOPY   04/24/2019   JOINT REPLACEMENT   9/13    L TKR   KNEE ARTHROSCOPY        Left   LEFT HEART CATH AND CORONARY ANGIOGRAPHY N/A 08/31/2017    Procedure: LEFT HEART CATH AND CORONARY ANGIOGRAPHY;  Surgeon: Nelva Bush, MD;  Location: [facility];  Service: Cardiovascular;  Laterality: N/A;   TOTAL KNEE ARTHROPLASTY   07/31/2012    Procedure: TOTAL KNEE ARTHROPLASTY;  Surgeon: Sydnee Cabal, MD;  Location: [facility];  Service:  Orthopedics;  Laterality: Left;   UMBILICAL HERNIA REPAIR   07-25-12    10 yrs ago   UPPER GASTROINTESTINAL ENDOSCOPY   04/24/2019      Social History LAMON ROTUNDO  reports that he quit smoking about 15 years ago. His smoking use included cigars. He has never used smokeless tobacco. He reports current alcohol use. He reports current drug use. Drug: Marijuana.   family history includes Colon cancer in his brother; Colon polyps in his brother; Coronary artery disease in an other family member; Diabetes (age of onset: 28) in his mother; Healthy in his daughter; Heart disease (age of onset: 98) in his father; Heart disease (age of onset: 69) in his brother; Lung cancer in his brother.        Allergies  Allergen Reactions   Hytrin [Terazosin] Other (See Comments)      "Makes me jittery"   Icosapent Ethyl        Can't take Vascepa - palpitations   Levaquin [Levofloxacin In D5w]        Cramps     Lipitor [Atorvastatin]        myalgias   Nexletol [Bempedoic Acid] Nausea And Vomiting   Penicillins Other (See Comments)      Convulsions Has patient had a PCN reaction causing immediate rash, facial/tongue/throat swelling, SOB or lightheadedness with hypotension: No Has patient had a PCN reaction causing severe rash involving mucus membranes or skin necrosis: No Has patient had a PCN reaction that required hospitalization: Yes Has patient had a PCN reaction occurring within the last 10 years: No If all of the above answers are "NO", then may proceed with Cephalosporin use.     Reglan [Metoclopramide]        Jerking, pains, CP   Xanax [Alprazolam]        "drunk" feeling          PHYSICAL EXAMINATION:  Vital signs: BP 100/82   Pulse (!) 59   Ht 6' (1.829 m)   Wt 233 lb 4 oz (105.8 kg)   BMI 31.63 kg/m   Constitutional: generally well-appearing, no acute distress Psychiatric: alert and oriented x3, cooperative Eyes: extraocular movements intact, anicteric, conjunctiva pink Mouth:  oral pharynx moist, no lesions Neck: supple no lymphadenopathy Cardiovascular: heart regular rate and rhythm, 3/6 systolic ejection murmur Lungs: clear to auscultation bilaterally Abdomen: soft, nontender, nondistended, no obvious ascites, no peritoneal signs, normal bowel sounds, no organomegaly Rectal: Deferred to colonoscopy Extremities: no lower extremity edema bilaterally Skin: no lesions on visible extremities Neuro: No focal deficits.  Cranial nerves intact   ASSESSMENT:   1.  History of multiple and advanced adenomatous colon polyps.  Due for surveillance. 2.  Chronic GERD requiring twice daily PPI to control symptoms. 3.  Dysphagia likely due to esophageal edema or stricture. 3.  Multiple significant medical problems.  Stable     PLAN:   1.  Reflux precautions 2.  Continue pantoprazole.  Medication risks reviewed 3.  Schedule upper endoscopy with possible esophageal dilation.  The patient is higher than baseline risk due to his comorbidities.The nature of the procedure, as well as the risks, benefits, and alternatives were carefully and thoroughly reviewed with the patient. Ample time for discussion and questions allowed. The patient understood, was satisfied, and agreed to proceed. 4.  Schedule surveillance colonoscopy.The nature of the procedure, as well as the risks, benefits, and alternatives were carefully and thoroughly reviewed with the patient. Ample time for discussion and questions allowed. The patient understood, was satisfied, and agreed to proceed.

## 2022-11-30 NOTE — Patient Instructions (Signed)
Await pathology results.  Please follow a post esophageal dilation diet (handout provided with instructions).    Handouts on polyps and post dilation diet provided.  YOU HAD AN ENDOSCOPIC PROCEDURE TODAY AT [facility]:   Refer to the procedure report that was given to you for any specific questions about what was found during the examination.  If the procedure report does not answer your questions, please call your gastroenterologist to clarify.  If you requested that your care partner not be given the details of your procedure findings, then the procedure report has been included in a sealed envelope for you to review at your convenience later.  YOU SHOULD EXPECT: Some feelings of bloating in the abdomen. Passage of more gas than usual.  Walking can help get rid of the air that was put into your GI tract during the procedure and reduce the bloating. If you had a lower endoscopy (such as a colonoscopy or flexible sigmoidoscopy) you may notice spotting of blood in your stool or on the toilet paper. If you underwent a bowel prep for your procedure, you may not have a normal bowel movement for a few days.  Please Note:  You might notice some irritation and congestion in your nose or some drainage.  This is from the oxygen used during your procedure.  There is no need for concern and it should clear up in a day or so.  SYMPTOMS TO REPORT IMMEDIATELY:  Following lower endoscopy (colonoscopy or flexible sigmoidoscopy):  Excessive amounts of blood in the stool  Significant tenderness or worsening of abdominal pains  Swelling of the abdomen that is new, acute  Fever of 100F or higher  Following upper endoscopy (EGD)  Vomiting of blood or coffee ground material  New chest pain or pain under the shoulder blades  Painful or persistently difficult swallowing  New shortness of breath  Fever of 100F or higher  Black, tarry-looking stools  For urgent or emergent issues, a  gastroenterologist can be reached at any hour by calling [phone]. Do not use MyChart messaging for urgent concerns.    DIET:  Nothing by mouth until 4:25 p.m.[name]  Clear liquids from 4:25 to 5:25pm.  Soft diet after 5:25pm and for the rest of the day.  You may proceed to your regular diet tomorrow morning.  Drink plenty of fluids but you should avoid alcoholic beverages for 24 hours.  ACTIVITY:  You should plan to take it easy for the rest of today and you should NOT DRIVE or use heavy machinery until tomorrow (because of the sedation medicines used during the test).    FOLLOW UP: Our staff will call the number listed on your records the next business day following your procedure.  We will call around 7:15- 8:00 am to check on you and address any questions or concerns that you may have regarding the information given to you following your procedure. If we do not reach you, we will leave a message.     If any biopsies were taken you will be contacted by phone or by letter within the next 1-3 weeks.  Please call us at [phone] if you have not heard about the biopsies in 3 weeks.    SIGNATURES/CONFIDENTIALITY: You and/or your care partner have signed paperwork which will be entered into your electronic medical record.  These signatures attest to the fact that that the information above on your After Visit Summary has been reviewed and is understood.  Full  responsibility of the confidentiality of this discharge information lies with you and/or your care-partner.  

## 2022-11-30 NOTE — Op Note (Signed)
[name] Patient Name: [name] Procedure Date: 11/30/2022 2:31 PM MRN: [id number] Endoscopist: Docia Chuck. Henrene Pastor , MD, [id number] Age: 72 Referring MD:  Date of Birth: 29-Dec-1950 Gender: Male Account #: [id number] Procedure:                Upper GI endoscopy with balloon dilation of the                            esophagus. 18-57m Indications:              Dysphagia, Esophageal reflux Medicines:                Monitored Anesthesia Care Procedure:                Pre-Anesthesia Assessment:                           - Prior to the procedure, a History and Physical                            was performed, and patient medications and                            allergies were reviewed. The patient's tolerance of                            previous anesthesia was also reviewed. The risks                            and benefits of the procedure and the sedation                            options and risks were discussed with the patient.                            All questions were answered, and informed consent                            was obtained. Prior Anticoagulants: The patient has                            taken no anticoagulant or antiplatelet agents. ASA                            Grade Assessment: II - A patient with mild systemic                            disease. After reviewing the risks and benefits,                            the patient was deemed in satisfactory condition to                            undergo the procedure.  After obtaining informed consent, the endoscope was                            passed under direct vision. Throughout the                            procedure, the patient's blood pressure, pulse, and                            oxygen saturations were monitored continuously. The                            GIF [id number] #[id number] was introduced through the                            mouth, and advanced to the second  part of duodenum.                            The upper GI endoscopy was accomplished without                            difficulty. The patient tolerated the procedure                            well. Scope In: Scope Out: Findings:                 One benign-appearing, intrinsic moderate stenosis                            was found 40 cm from the incisors. This stenosis                            measured 1.5 cm (inner diameter). A TTS dilator was                            passed through the scope. Dilation with an 18-19-20                            mm balloon dilator was performed to 20 mm.                           The exam of the esophagus was otherwise normal.                           The stomach was normal, save small hiatal hernia.                           The examined duodenum was normal.                           The cardia and gastric fundus were normal on  retroflexion. Complications:            No immediate complications. Estimated Blood Loss:     Estimated blood loss: none. Impression:               - Benign-appearing esophageal stenosis. Dilated.                           - Normal stomach.                           - Normal examined duodenum.                           - No specimens collected.                           - Office follow up in one year Recommendation:           - Patient has a contact number available for                            emergencies. The signs and symptoms of potential                            delayed complications were discussed with the                            patient. Return to normal activities tomorrow.                            Written discharge instructions were provided to the                            patient.                           - Post dilation diet.                           - Continue present medications. Docia Chuck. Henrene Pastor, MD 11/30/2022 3:21:38 PM This report has been signed electronically.

## 2022-11-30 NOTE — Progress Notes (Signed)
Pt in recovery with monitors in place, VSS. Report given to receiving RN. Bite guard was placed with pt awake to ensure comfort. No dental or soft tissue damage noted. 

## 2022-11-30 NOTE — Op Note (Signed)
[facility] Patient Name: [name] Procedure Date: 11/30/2022 2:31 PM MRN: [id number] Endoscopist: Docia Chuck. Henrene Pastor , MD, [id number] Age: 72 Referring MD:  Date of Birth: [birth year] Gender: Male Account #: [id number] Procedure:                Colonoscopy with cold snare polypectomy x 3,                            biopsies Indications:              High risk colon cancer surveillance: Personal                            history of adenoma (10 mm or greater in size), High                            risk colon cancer surveillance: Personal history of                            multiple (3 or more) adenomas, LST. Prior 2020, 2021 Medicines:                Monitored Anesthesia Care Procedure:                Pre-Anesthesia Assessment:                           - Prior to the procedure, a History and Physical                            was performed, and patient medications and                            allergies were reviewed. The patient's tolerance of                            previous anesthesia was also reviewed. The risks                            and benefits of the procedure and the sedation                            options and risks were discussed with the patient.                            All questions were answered, and informed consent                            was obtained. Prior Anticoagulants: The patient has                            taken no anticoagulant or antiplatelet agents. ASA                            Grade Assessment: II - A patient with mild systemic  disease. After reviewing the risks and benefits,                            the patient was deemed in satisfactory condition to                            undergo the procedure.                           After obtaining informed consent, the colonoscope                            was passed under direct vision. Throughout the                            procedure, the  patient's blood pressure, pulse, and                            oxygen saturations were monitored continuously. The                            Olympus CF-[id number] (Serial# [id number]) Colonoscope was                            introduced through the anus and advanced to the the                            cecum, identified by appendiceal orifice and                            ileocecal valve. The ileocecal valve, appendiceal                            orifice, and rectum were photographed. The quality                            of the bowel preparation was excellent. The                            colonoscopy was performed without difficulty. The                            patient tolerated the procedure well. The bowel                            preparation used was SUTAB via split dose                            instruction. Scope In: 2:39:40 PM Scope Out: 2:57:15 PM Scope Withdrawal Time: 0 hours 15 minutes 18 seconds  Total Procedure Duration: 0 hours 17 minutes 35 seconds  Findings:                 Three polyps were found in the sigmoid colon and  ascending colon. The polyps were 2 to 5 mm in size.                            These polyps were removed with a cold snare.                            Resection and retrieval were complete.                           The prior polypectomy scar, in tattoo, was                            biopsied. The entire examined colon appeared                            otherwise normal on direct and retroflexion views. Complications:            No immediate complications. Estimated blood loss:                            None. Estimated Blood Loss:     Estimated blood loss: none. Impression:               - Three 2 to 5 mm polyps in the sigmoid colon and                            in the ascending colon, removed with a cold snare.                            Resected and retrieved.                           - The entire examined colon is  normal on direct and                            retroflexion views. Prior polypectomy scar biopsied. Recommendation:           - Repeat colonoscopy in 3 years for surveillance.                           - Patient has a contact number available for                            emergencies. The signs and symptoms of potential                            delayed complications were discussed with the                            patient. Return to normal activities tomorrow.                            Written discharge instructions were provided to the  patient.                           - Resume previous diet.                           - Continue present medications.                           - Await pathology results. Docia Chuck. Henrene Pastor, MD 11/30/2022 3:07:36 PM This report has been signed electronically.

## 2022-11-30 NOTE — Progress Notes (Signed)
VS by DT  Pt's states no medical or surgical changes since previsit or office visit.  

## 2022-11-30 NOTE — Progress Notes (Signed)
Called to room to assist during endoscopic procedure.  Patient ID and intended procedure confirmed with present staff. Received instructions for my participation in the procedure from the performing physician.

## 2022-12-01 ENCOUNTER — Telehealth: Payer: Self-pay

## 2022-12-01 NOTE — Telephone Encounter (Signed)
  Follow up Call-     11/30/2022    2:09 PM  Call back number  Post procedure Call Back phone  # [phone]  Permission to leave phone message Yes     Patient questions:  Do you have a fever, pain , or abdominal swelling? No. Pain Score  0 *  Have you tolerated food without any problems? Yes.    Have you been able to return to your normal activities? Yes.    Do you have any questions about your discharge instructions: Diet   No. Medications  No. Follow up visit  No.  Do you have questions or concerns about your Care? No.  Actions: * If pain score is 4 or above: No action needed, pain <4.

## 2022-12-07 ENCOUNTER — Encounter: Payer: Self-pay | Admitting: Internal Medicine

## 2022-12-10 ENCOUNTER — Encounter: Payer: Self-pay | Admitting: Internal Medicine

## 2022-12-13 ENCOUNTER — Encounter: Payer: Self-pay | Admitting: Internal Medicine

## 2022-12-13 ENCOUNTER — Ambulatory Visit (INDEPENDENT_AMBULATORY_CARE_PROVIDER_SITE_OTHER): Payer: Medicare HMO | Admitting: Internal Medicine

## 2022-12-13 VITALS — BP 138/80 | HR 82 | Temp 98.6°F | Ht 72.0 in | Wt 231.0 lb

## 2022-12-13 DIAGNOSIS — R0609 Other forms of dyspnea: Secondary | ICD-10-CM

## 2022-12-13 DIAGNOSIS — J4521 Mild intermittent asthma with (acute) exacerbation: Secondary | ICD-10-CM

## 2022-12-13 DIAGNOSIS — J0101 Acute recurrent maxillary sinusitis: Secondary | ICD-10-CM

## 2022-12-13 DIAGNOSIS — R053 Chronic cough: Secondary | ICD-10-CM | POA: Diagnosis not present

## 2022-12-13 DIAGNOSIS — J449 Chronic obstructive pulmonary disease, unspecified: Secondary | ICD-10-CM | POA: Diagnosis not present

## 2022-12-13 DIAGNOSIS — R059 Cough, unspecified: Secondary | ICD-10-CM | POA: Insufficient documentation

## 2022-12-13 DIAGNOSIS — I350 Nonrheumatic aortic (valve) stenosis: Secondary | ICD-10-CM

## 2022-12-13 HISTORY — DX: Cough, unspecified: R05.9

## 2022-12-13 MED ORDER — METHYLPREDNISOLONE ACETATE 80 MG/ML IJ SUSP
80.0000 mg | Freq: Once | INTRAMUSCULAR | Status: AC
  Administered 2022-12-13: 80 mg via INTRAMUSCULAR

## 2022-12-13 MED ORDER — METHYLPREDNISOLONE 4 MG PO TBPK: ORAL_TABLET | ORAL | 0 refills | Status: DC

## 2022-12-13 MED ORDER — CEFDINIR 300 MG PO CAPS: 300.0000 mg | ORAL_CAPSULE | Freq: Two times a day (BID) | ORAL | 0 refills | Status: DC

## 2022-12-13 MED ORDER — PROMETHAZINE-DM 6.25-15 MG/5ML PO SYRP
5.0000 mL | ORAL_SOLUTION | Freq: Four times a day (QID) | ORAL | 0 refills | Status: DC | PRN
Start: 2022-12-13 — End: 2023-01-04

## 2022-12-13 NOTE — Assessment & Plan Note (Signed)
Refractory Pulmonary ref - r/o pulm HTN, ILD etc

## 2022-12-13 NOTE — Progress Notes (Signed)
Subjective:  Patient ID: [name], male    DOB: 06-09-1951  Age: 72 y.o. MRN: [id number]  CC: Nasal Congestion (Still having congestion from uri , took covid test this morning and negative)   HPI [name] presents for URI since Fri last week. C/o chronic cough spells x several months.Marland KitchenMarland KitchenChest CT in 9/21 was OK.  Outpatient Medications Prior to Visit  Medication Sig Dispense Refill   acetaminophen (TYLENOL) 500 MG tablet Take 500 mg by mouth every 6 (six) hours as needed for headache.     ALPRAZolam (XANAX) 0.5 MG tablet Take 1 tablet by mouth twice daily as needed for anxiety 60 tablet 1   aspirin 81 MG tablet Take 81 mg by mouth at bedtime.     Cyanocobalamin (VITAMIN B-12) 2500 MCG SUBL Place 5,000 mcg under the tongue daily.     escitalopram (LEXAPRO) 10 MG tablet Take 1 tablet by mouth once daily 90 tablet 1   Evolocumab (REPATHA SURECLICK) 409 MG/ML SOAJ Inject 1 mL into the skin every 14 (fourteen) days. 2 mL 11   fluticasone (FLONASE) 50 MCG/ACT nasal spray Use 1 spray(s) in each nostril once daily 16 g 5   fluticasone-salmeterol (ADVAIR) 100-50 MCG/ACT AEPB Inhale 1 puff into the lungs 2 (two) times daily. 1 each 3   loratadine (EQ LORATADINE) 10 MG tablet TAKE 1 TABLET BY MOUTH ONCE DAILY AS NEEDED FOR ALLERGIES 90 tablet 3   meloxicam (MOBIC) 7.5 MG tablet Take 2 tablets by mouth once daily 180 tablet 0   Multiple Vitamin (MULTIVITAMIN WITH MINERALS) TABS tablet Take 1 tablet by mouth daily.     nitroGLYCERIN (NITROSTAT) 0.4 MG SL tablet Place 1 tablet (0.4 mg total) under the tongue every 5 (five) minutes as needed for chest pain. 25 tablet 9   pantoprazole (PROTONIX) 40 MG tablet Take 1 tablet by mouth twice daily 180 tablet 3   traMADol (ULTRAM) 50 MG tablet TAKE 1 TO 2 TABLETS BY MOUTH THREE TIMES DAILY AS NEEDED 180 tablet 1   valACYclovir (VALTREX) 500 MG tablet Take 1 tablet by mouth once daily 90 tablet 1   No facility-administered medications prior to visit.     ROS: Review of Systems  Constitutional:  Negative for appetite change, fatigue and unexpected weight change.  HENT:  Positive for congestion, postnasal drip, rhinorrhea, sinus pressure and sinus pain. Negative for nosebleeds, sneezing, sore throat and trouble swallowing.   Eyes:  Negative for itching and visual disturbance.  Respiratory:  Positive for cough, shortness of breath and wheezing.   Cardiovascular:  Negative for chest pain, palpitations and leg swelling.  Gastrointestinal:  Negative for abdominal distention, blood in stool, diarrhea and nausea.  Genitourinary:  Negative for frequency and hematuria.  Musculoskeletal:  Negative for back pain, gait problem, joint swelling and neck pain.  Skin:  Negative for rash.  Neurological:  Negative for dizziness, tremors, speech difficulty and weakness.  Psychiatric/Behavioral:  Negative for agitation, dysphoric mood and sleep disturbance. The patient is not nervous/anxious.     Objective:  BP 138/80 (BP Location: Right Arm, Patient Position: Sitting, Cuff Size: Normal)   Pulse 82   Temp 98.6 F (37 C) (Oral)   Ht 6' (1.829 m)   Wt 231 lb (104.8 kg)   SpO2 98%   BMI 31.33 kg/m   BP Readings from Last 3 Encounters:  12/13/22 138/80  11/30/22 135/81  10/26/22 132/70    Wt Readings from Last 3 Encounters:  12/13/22 231 lb (  104.8 kg)  11/30/22 233 lb (105.7 kg)  11/01/22 233 lb (105.7 kg)    Physical Exam Constitutional:      General: He is not in acute distress.    Appearance: He is well-developed. He is obese.     Comments: NAD  Eyes:     Conjunctiva/sclera: Conjunctivae normal.     Pupils: Pupils are equal, round, and reactive to light.  Neck:     Thyroid: No thyromegaly.     Vascular: No JVD.  Cardiovascular:     Rate and Rhythm: Normal rate and regular rhythm.     Heart sounds: Murmur heard.     No friction rub. No gallop.  Pulmonary:     Effort: Pulmonary effort is normal. No respiratory distress.      Breath sounds: Normal breath sounds. No wheezing or rales.  Chest:     Chest wall: No tenderness.  Abdominal:     General: Bowel sounds are normal. There is no distension.     Palpations: Abdomen is soft. There is no mass.     Tenderness: There is no abdominal tenderness. There is no guarding or rebound.  Musculoskeletal:        General: No tenderness. Normal range of motion.     Cervical back: Normal range of motion.     Right lower leg: No edema.     Left lower leg: No edema.  Lymphadenopathy:     Cervical: No cervical adenopathy.  Skin:    General: Skin is warm and dry.     Findings: No rash.  Neurological:     Mental Status: He is alert and oriented to person, place, and time.     Cranial Nerves: No cranial nerve deficit.     Motor: No abnormal muscle tone.     Coordination: Coordination normal.     Gait: Gait normal.     Deep Tendon Reflexes: Reflexes are normal and symmetric.  Psychiatric:        Behavior: Behavior normal.        Thought Content: Thought content normal.        Judgment: Judgment normal.     Lab Results  Component Value Date   WBC 6.5 07/13/2022   HGB 15.0 07/13/2022   HCT 43.3 07/13/2022   PLT 269 07/13/2022   GLUCOSE 112 (H) 07/13/2022   CHOL 191 07/13/2022   TRIG 231 (H) 07/13/2022   HDL 44 07/13/2022   LDLDIRECT 110 (H) 10/07/2019   LDLCALC 107 (H) 07/13/2022   ALT 24 07/13/2022   AST 23 07/13/2022   NA 140 07/13/2022   K 4.9 07/13/2022   CL 101 07/13/2022   CREATININE 0.83 07/13/2022   BUN 17 07/13/2022   CO2 24 07/13/2022   TSH 3.650 07/13/2022   PSA 1.18 07/28/2020   INR 1.0 08/29/2017   HGBA1C 6.0 (H) 07/13/2022    CT CHEST WO CONTRAST  Result Date: 08/04/2022 CLINICAL DATA:  Follow-up of aortic aneurysm identified on echo. EXAM: CT CHEST WITHOUT CONTRAST TECHNIQUE: Multidetector CT imaging of the chest was performed following the standard protocol without IV contrast. RADIATION DOSE REDUCTION: This exam was performed according  to the departmental dose-optimization program which includes automated exposure control, adjustment of the mA and/or kV according to patient size and/or use of iterative reconstruction technique. COMPARISON:  11/12/2015 FINDINGS: Cardiovascular: Normal caliber of the great vessels. Aortic atherosclerosis. Ascending aorta measures 4.1 cm just above the sino-tubular junction on transverse image 73/2 and 4.2 cm in  the mid ascending segment on transverse image 63/2. Compare maximally 4.1 cm on the prior. Based on coronal reformats, the aorta measures 4.2 cm in the mid ascending segment on image 88/5. 3.1 cm at the sinuses of Valsalva and 3.2 cm at the sinotubular junction. Normal transverse and descending aortic caliber. Mild cardiomegaly with LAD and right coronary artery calcification. Aortic valve calcification suggest valvular disease. Pulmonary artery enlargement, outflow tract 3.4 cm. Mediastinum/Nodes: No mediastinal or definite hilar adenopathy, given limitations of unenhanced CT. Lungs/Pleura: No pleural fluid. Left hemidiaphragm elevation. Mild centrilobular and paraseptal emphysema. 2-3 mm right middle lobe pulmonary nodule on 59/4 was present on 11/12/2015 and is presumed benign. Upper Abdomen: Normal imaged portions of the liver, spleen, stomach, pancreas, adrenal glands, kidneys. Musculoskeletal: No acute osseous abnormality. IMPRESSION: 1. 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 Slightly increased compared to 2016. Recommend annual imaging followup by CTA or MRA. This recommendation follows 2010 ACCF/AHA/AATS/ACR/ASA/SCA/SCAI/SIR/STS/SVM Guidelines for the Diagnosis and Management of Patients with Thoracic Aortic Disease. Circulation. 2010; 121: V409-W119. Aortic aneurysm NOS (ICD10-I71.9) 2.  No acute process in the chest. 3. Aortic atherosclerosis (ICD10-I70.0), coronary artery atherosclerosis and emphysema (ICD10-J43.9). 4. Pulmonary artery enlargement suggests pulmonary arterial hypertension.  Electronically Signed   By: Abigail Miyamoto M.D.   On: 08/04/2022 12:17    Assessment & Plan:   Problem List Items Addressed This Visit       Cardiovascular and Mediastinum   Aortic stenosis, moderate    F/u w/cardiology        Respiratory   Acute sinusitis    Start Cefdinir Medrol pack      Relevant Medications   promethazine-dextromethorphan (PROMETHAZINE-DM) 6.25-15 MG/5ML syrup   cefdinir (OMNICEF) 300 MG capsule   methylPREDNISolone (MEDROL DOSEPAK) 4 MG TBPK tablet   Asthmatic bronchitis - Primary    Use a face mask, filters, face shield when exposed to rock dust Chest CT in 9/21 was OK. Medrol pac Advair bid      Relevant Medications   methylPREDNISolone (MEDROL DOSEPAK) 4 MG TBPK tablet   Other Relevant Orders   Ambulatory referral to Pulmonology     Other   Cough    Worse Pulm ref R/o ILD, pulm HTN etc. 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    Dispense:  240 mL    Refill:  0   cefdinir (OMNICEF) 300 MG capsule    Sig: Take 1 capsule (300 mg total) by mouth 2 (two) times daily.    Dispense:  20 capsule    Refill:  0   methylPREDNISolone (MEDROL DOSEPAK) 4 MG TBPK tablet    Sig: As directed    Dispense:  21 tablet    Refill:  0      Follow-up: Return in about 3 months (around 03/14/2023) for a follow-up visit.  Walker Kehr, MD

## 2022-12-13 NOTE — Assessment & Plan Note (Signed)
Start Cefdinir Medrol pack

## 2022-12-13 NOTE — Addendum Note (Signed)
Addended by: Basil Dess on: 12/13/2022 04:07 PM   Modules accepted: Orders

## 2022-12-13 NOTE — Assessment & Plan Note (Signed)
F/u w/cardiology

## 2022-12-13 NOTE — Assessment & Plan Note (Signed)
Use a face mask, filters, face shield when exposed to rock dust Chest CT in 9/21 was OK. Medrol pac Advair bid

## 2022-12-13 NOTE — Assessment & Plan Note (Signed)
Worse Pulm ref R/o ILD, pulm HTN etc. H/o recent rock dust exposure Prom cough syr

## 2022-12-26 ENCOUNTER — Other Ambulatory Visit: Payer: Self-pay | Admitting: Internal Medicine

## 2023-01-03 NOTE — Progress Notes (Unsigned)
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      History of Present Illness  01/04/2023  Pulmonary/ 1st office eval/[facility] Pickelsimer  Chief Complaint  Patient presents with   Pulmonary Consult    Referred by Dr. Alain Marion.  Pt c/o cough and chest congestion over the past several months. He has some PND. Coughing up 6-8 ounces per day of white sputum. He has occ SOB walking up stairs.   Dyspnea:  MMRC1 = can walk nl pace, flat grade, can't hurry or go uphills or steps s sob   Cough: fits of coughing usually minimal < 1 tsp white s obvious trigger, some better since wearing protective measures when grinding rock dust  Sleep: wakes him up at least once night  SABA use: not helping  Has gerd on PPI bid per Henrene Pastor  Some better on pred, no better on advair   No obvious day to day or daytime pattern/variability or assoc   purulent sputum or mucus plugs or hemoptysis or cp or chest tightness, subjective wheeze or overt sinus or hb symptoms.     Also denies any obvious fluctuation of symptoms with weather or environmental changes or other aggravating or alleviating factors except as outlined above   No unusual exposure hx or h/o childhood pna/ asthma or knowledge of premature birth.  Current Allergies, Complete Past Medical History, Past Surgical History, Family History, and Social History were reviewed in [employer] record.  ROS  The following are not active complaints unless bolded Hoarseness, sore throat, dysphagia, dental problems, itching, sneezing,  nasal congestion or discharge of excess mucus or purulent secretions, ear ache,   fever, chills, sweats, unintended wt loss or wt gain, classically pleuritic or exertional cp,   orthopnea pnd or arm/hand swelling  or leg swelling, presyncope, palpitations, abdominal pain, anorexia, nausea, vomiting, diarrhea  or change in bowel habits or change in bladder habits, change in stools or change in urine, dysuria, hematuria,  rash, arthralgias, visual complaints, headache, numbness, weakness or ataxia or problems with walking or coordination,  change in mood or  memory.            Past Medical History:  Diagnosis Date   Actinic keratosis    Acute sinusitis 06/21/2010   9/14, 10/19    Allergy    Anxiety 08/24/2017   Chronic  Worse in 10/18 Xanax prn - d/c  Potential benefits of a long term benzodiazepines  use as well as potential risks  and complications were explained to the patient and were aknowledged. 11/18 Lexapro     Arthralgia 04/14/2017   2018 2019 Rheum consult; Gluten free trial Dr Estanislado Pandy 12[facility] Depo-medrol IM Increase Tramadol w/caution  Potential benefits of a long term opioids use as well as potential risks (i.e. addiction risk, apnea etc) and complications (i.e. Somnolence, constipation and others) were explained to the patient and were aknowledged.   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 Pravastatin - pt stopped due to side effects 1/21 Zetia - causing pain too Krill oil d/c.  Repatha option discussed.  9/21 Vascepa, Zetia   Chest pain 12/07/2020   Chondrocalcinosis 07/06/2018   Chronic sinusitis 08/12/2013    Sinus congestion, SOB - better  after surgery - nov 2014 9/14 worse CT IMPRESSION:  Minimal sinus mucosal thickening, primarily in the frontal recesses.  Small right maxillary mucous retention cyst. No CT evidence of acute  sinusitis.  Electronically Signed  By: Lars Pinks M.D.  On: 08/15/2013 09:19  Doxy x 4 wks   Colonic polyp    COLONIC POLYPS, HX OF    CONTACT DERMATITIS DUE TO SOLVENTS    DDD (degenerative disc disease), cervical 07/06/2018   DDD (degenerative disc disease), lumbar 07/06/2018   and facet joint arthropathy   Dizzinesses 07/18/2017   9/18 I suggested a brain MRI to r/o acustic neuroma etc: he will think about it and check on cost ENT ref suggested   DOE (dyspnea on exertion) 07/25/2012   related to eating shrimp   Dyslipidemia 06/21/2013   Chronic  Elev TG, low HDL, nl LDL Pravastatin - pt stopped due to side effects 1/21 Repatha option discussed. On Zetia - c/o side effeects 9/21 Vascepa, Zetia   Dyspnea 07/25/2012   related to eating shrimp   Essential hypertension 07/29/2021   Fatigue 06/09/2015   2016 no OSA  2018 much better (60%) off Reglan.    Folliculitis [id number]   GERD 05/09/2008   Chronic  -- Protonix 5/18 We discussed his GERD/Barrett's treated at [facility] (Dr Ferdinand Lango). Reglan was d/c'd due to CP. He was dx'd w/pancreatitis and started on Zenpep He had an abd CT, [place], EGD and colonoscopy. 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 Try Flexeril at hs.   KNEE PAIN    Laryngopharyngeal reflux (LPR) 10/02/2018   Localized swelling, mass or lump of neck 09/05/2018   Low  back pain 06/09/2015   Per Dr Alvan Dame 3/17 - planning to see Dr Annette Stable Body pains are much better (60%) off Reglan. Off Tramadol 2019 Rheum consult; Gluten free trial Tramadol  Potential benefits of a long term opioids use as well as potential risks (i.e. addiction risk, apnea etc) and complications (i.e. Somnolence, constipation and others) were explained to the patient and were aknowledged.   Nausea 07/18/2017   Chronic w/dizziness I suggested a brain MRI to r/o acustic neuroma etc: he will think about it and check on cost 9/18   Neoplasm of uncertain behavior of skin    OA (osteoarthritis) of knee    Obesity (BMI 30.0-34.9) 01/07/2021   ONYCHOMYCOSIS  OSA (obstructive sleep apnea) 08/21/2013   2014 no need for CPAP    Preop exam for internal medicine    Primary osteoarthritis of both hands 11/21/2008   Chronic and severe Tramadol  Potential benefits of a long term opioids use as well as potential risks (i.e. addiction risk, apnea etc) and complications (i.e. Somnolence, constipation and others) were explained to the patient and were aknowledged.   10/18 Body pains are much better (60%) off Reglan. 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 Xanax prn - d/c  Potential benefits of a long term benzodiazepines  use as well as potential risks  and complications were explained to the patient and were aknowledged. Neurol ref - Dr Tat:   "Hi    Umbilical hernia [id number]   Relapsed Surgical ref   Upper respiratory infection 07/27/2018   9/19, 12/21   Well adult exam     Outpatient Medications Prior to Visit  Medication Sig Dispense Refill   acetaminophen (TYLENOL) 500 MG tablet Take 500 mg by mouth every 6 (six) hours as needed for headache.     ALPRAZolam (XANAX) 0.5 MG tablet Take 1 tablet by mouth twice daily as needed for anxiety 60 tablet 1   aspirin 81 MG tablet Take 81 mg by mouth at bedtime.     Cyanocobalamin (VITAMIN B-12) 2500 MCG SUBL Place 5,000 mcg under the tongue daily.     escitalopram (LEXAPRO) 10 MG tablet Take 1 tablet by mouth once daily 90 tablet 1   Evolocumab (REPATHA SURECLICK) [id number] MG/ML SOAJ Inject 1 mL into the skin every 14 (fourteen) days. 2 mL 11   fluticasone (FLONASE) 50 MCG/ACT nasal spray Use 1 spray(s) in each nostril once daily 16 g 5   loratadine (EQ LORATADINE) 10 MG tablet TAKE 1 TABLET BY MOUTH ONCE DAILY AS NEEDED FOR ALLERGIES 90 tablet 3   meloxicam (MOBIC) 7.5 MG tablet Take 2 tablets by mouth once daily (Patient taking differently: Take 7.5 mg by mouth daily as needed.) 180 tablet 0   Multiple Vitamin (MULTIVITAMIN WITH MINERALS) TABS tablet Take 1 tablet by mouth daily.     nitroGLYCERIN (NITROSTAT) 0.4 MG SL tablet Place 1 tablet (0.4 mg total) under the tongue every 5 (five) minutes as needed for chest pain. 25 tablet 9   pantoprazole (PROTONIX) 40 MG tablet Take 1 tablet by mouth twice daily 180 tablet 3   valACYclovir (VALTREX) 500 MG tablet Take 1 tablet by mouth once daily 90 tablet 0   fluticasone-salmeterol (ADVAIR) 100-50 MCG/ACT AEPB Inhale 1 puff into the lungs 2 (two) times daily. 1 each 3   cefdinir (OMNICEF) 300 MG capsule Take 1 capsule (300 mg total) by mouth 2 (two) times daily. 20 capsule 0   methylPREDNISolone (MEDROL DOSEPAK) 4 MG TBPK tablet As directed 21 tablet 0   promethazine-dextromethorphan (PROMETHAZINE-DM) 6.25-15 MG/5ML syrup  Take 5 mLs by mouth 4 (four)  times daily as needed for cough. 240 mL 0   traMADol (ULTRAM) 50 MG tablet TAKE 1 TO 2 TABLETS BY MOUTH THREE TIMES DAILY AS NEEDED (Patient not taking: Reported on 01/04/2023) 180 tablet 1   No facility-administered medications prior to visit.     Objective:     BP 114/80 (BP Location: Left Arm, Cuff Size: Normal)   Pulse 75   Temp (!) 97.5 F (36.4 C) (Oral)   Ht 6' (1.829 m)   Wt 225 lb (102.1 kg)   SpO2 95% Comment: on RA  BMI 30.52 kg/m   SpO2: 95 % (on RA)  Pleasant wm nad    HEENT : Oropharynx  clear     Nasal turbinates nl    NECK :  without  apparent JVD/ palpable Nodes/TM    LUNGS: no acc muscle use,  Nl contour chest which is clear to A and P bilaterally without cough on insp or exp maneuvers   CV:  RRR  no s3 or murmur or increase in P2, and no edema   ABD:  soft and nontender with nl inspiratory excursion in the supine position. No bruits or organomegaly appreciated   MS:  Nl gait/ ext warm without deformities Or obvious joint restrictions  calf tenderness, cyanosis or clubbing    SKIN: warm and dry without lesions    NEURO:  alert, approp, nl sensorium with  no motor or cerebellar deficits apparent.   CXR PA and Lateral:   01/04/2023 :    I personally reviewed images and impression is as follows:     No def ild/    Assessment   No problem-specific Assessment & Plan notes found for this encounter.     Christinia Gully, MD 01/04/2023

## 2023-01-04 ENCOUNTER — Ambulatory Visit: Payer: Medicare HMO | Admitting: Internal Medicine

## 2023-01-04 ENCOUNTER — Ambulatory Visit (INDEPENDENT_AMBULATORY_CARE_PROVIDER_SITE_OTHER): Payer: Medicare HMO

## 2023-01-04 ENCOUNTER — Encounter: Payer: Self-pay | Admitting: Internal Medicine

## 2023-01-04 DIAGNOSIS — J4521 Mild intermittent asthma with (acute) exacerbation: Secondary | ICD-10-CM

## 2023-01-04 DIAGNOSIS — R059 Cough, unspecified: Secondary | ICD-10-CM | POA: Diagnosis not present

## 2023-01-04 MED ORDER — BREZTRI AEROSPHERE 160-9-4.8 MCG/ACT IN AERO: 2.0000 | INHALATION_SPRAY | Freq: Two times a day (BID) | RESPIRATORY_TRACT | 0 refills | Status: DC

## 2023-01-04 NOTE — Patient Instructions (Addendum)
Breztri (Symbicort 80) one every 12 hours until return   Work on inhaler technique:  relax and gently blow all the way out then take a nice smooth full deep breath back in, triggering the inhaler at same time you start breathing in.  Hold breath in for at least  5 seconds if you can. Blow out [employer] /breztri thru nose. Rinse and gargle with water when done.  If mouth or throat bother you at all,  try brushing teeth/gums/tongue with arm and hammer toothpaste/ make a slurry and gargle and spit out.     Continue pantoprazole 40 mg Take 30- 60 min before your first and last meals of the day   GERD (REFLUX)  is an extremely common cause of respiratory symptoms just like yours , many times with no obvious heartburn at all.    It can be treated with medication, but also with lifestyle changes including elevation of the head of your bed (ideally with 6 -8inch blocks under the headboard of your bed),  Smoking cessation, avoidance of late meals, excessive alcohol, and avoid fatty foods, chocolate, peppermint, colas, red wine, and acidic juices such as orange juice.  NO MINT OR MENTHOL PRODUCTS SO NO COUGH DROPS  USE SUGARLESS CANDY INSTEAD (Jolley ranchers or Stover's or Life Savers) or even ice chips will also do - the key is to swallow to prevent all throat clearing. NO OIL BASED VITAMINS - use powdered substitutes.  Avoid fish oil when coughing.    Please remember to go to the  x-ray department  for your tests - we will call you with the results when they are available    Please schedule a follow up office visit in 6 weeks, call sooner if needed with all medications /inhalers/ solutions in hand so we can verify exactly what you are taking. This includes all medications from all doctors and over the counters

## 2023-01-04 NOTE — Assessment & Plan Note (Addendum)
Onset fall 2023 much worse since 10/2022 chest congestion, cough: was using a jack hammer on quarts   - lung portion of Coronary Chest CT in 08/03/22  Mild centrilobular and paraseptal emphysema s evidence silicosis or other ILD  - 01/04/2023   Walked on RA  x  3  lap(s) =  approx 750  ft  @ fast pace, stopped due to end of study with lowest 02 sats 94% and no cough / min sob    Advair bid> d/c 01/04/2023 and changed to symbicort 80 one bid x 6 week trial (sample of breztri to get him started on the technique    - The proper method of use, as well as anticipated side effects, of a metered-dose inhaler were discussed and demonstrated to the patient using teach back method. Improved effectiveness after extensive coaching during this visit to a level of approximately 75 % from a baseline of 50% with hfa   In addition rec max rx for gerd with ppi bid ac and approp diet   Avoid further dust inhalation if at all possible  Regroup in 6 weeks, call sooner if needed   Note: coughing on inspiration is typical of ILD/ at end of exp more suggestive of AB - he really had neither patter on my evaluatio today so not clear what the source of the cough is at this point          Each maintenance medication was reviewed in detail including emphasizing most importantly the difference between maintenance and prns and under what circumstances the prns are to be triggered using an action plan format where appropriate.  Total time for H and P, chart review, counseling, reviewing hfa device(s) , directly observing portions of ambulatory 02 saturation study/ and generating customized AVS unique to this office visit / same day charting > 45 min new pt eval with resp symptoms of unclear etiology

## 2023-01-05 DIAGNOSIS — D225 Melanocytic nevi of trunk: Secondary | ICD-10-CM | POA: Diagnosis not present

## 2023-01-05 DIAGNOSIS — C44612 Basal cell carcinoma of skin of right upper limb, including shoulder: Secondary | ICD-10-CM | POA: Diagnosis not present

## 2023-01-05 DIAGNOSIS — D2271 Melanocytic nevi of right lower limb, including hip: Secondary | ICD-10-CM | POA: Diagnosis not present

## 2023-01-05 DIAGNOSIS — L814 Other melanin hyperpigmentation: Secondary | ICD-10-CM | POA: Diagnosis not present

## 2023-01-05 DIAGNOSIS — L821 Other seborrheic keratosis: Secondary | ICD-10-CM | POA: Diagnosis not present

## 2023-01-10 ENCOUNTER — Telehealth: Payer: Self-pay

## 2023-01-10 NOTE — Telephone Encounter (Signed)
Called patient and gave cxr results.  Patient wants to know if Dr. Melvyn Novas was going to call in a daily inhaler.  He states nothing was called in but he was given a Breztri sample at his OV.  Patient states the [facility] sample stopped spraying after two uses.  Please advise.  Pharmacy is [facility] in [place], [place]

## 2023-01-10 NOTE — Telephone Encounter (Signed)
By AVS he was supposed to use the breztri to get used to the technique and if doing better then symbciort 80 2bid generic so ok to call this in or give him another breztri but either way be sure bring all inhalers back (including the one he says isn't working ) at f/u in 6 weeks

## 2023-01-11 MED ORDER — BUDESONIDE-FORMOTEROL FUMARATE 80-4.5 MCG/ACT IN AERO: 2.0000 | INHALATION_SPRAY | Freq: Two times a day (BID) | RESPIRATORY_TRACT | 6 refills | Status: DC

## 2023-01-11 NOTE — Telephone Encounter (Signed)
Called patient.  He prefers to have generic Symbicort 80 mcg called into [place], [place], [place].  He will save the [facility] that is not spraying well and bring all meds/inhalers in with him to next OV. Sent in generic Symbicort 80 mcg.

## 2023-01-12 ENCOUNTER — Encounter: Payer: Self-pay | Admitting: Internal Medicine

## 2023-01-25 ENCOUNTER — Ambulatory Visit: Payer: Medicare HMO | Admitting: Internal Medicine

## 2023-01-28 DIAGNOSIS — Z0100 Encounter for examination of eyes and vision without abnormal findings: Secondary | ICD-10-CM | POA: Diagnosis not present

## 2023-01-28 DIAGNOSIS — H2513 Age-related nuclear cataract, bilateral: Secondary | ICD-10-CM | POA: Diagnosis not present

## 2023-02-28 ENCOUNTER — Ambulatory Visit (INDEPENDENT_AMBULATORY_CARE_PROVIDER_SITE_OTHER): Payer: Medicare HMO | Admitting: Internal Medicine

## 2023-02-28 ENCOUNTER — Encounter: Payer: Self-pay | Admitting: Internal Medicine

## 2023-02-28 VITALS — BP 126/80 | HR 67 | Temp 97.6°F | Ht 72.0 in | Wt 233.0 lb

## 2023-02-28 DIAGNOSIS — R5382 Chronic fatigue, unspecified: Secondary | ICD-10-CM

## 2023-02-28 DIAGNOSIS — R739 Hyperglycemia, unspecified: Secondary | ICD-10-CM

## 2023-02-28 DIAGNOSIS — I700 Atherosclerosis of aorta: Secondary | ICD-10-CM | POA: Diagnosis not present

## 2023-02-28 DIAGNOSIS — N320 Bladder-neck obstruction: Secondary | ICD-10-CM | POA: Diagnosis not present

## 2023-02-28 DIAGNOSIS — M25561 Pain in right knee: Secondary | ICD-10-CM

## 2023-02-28 DIAGNOSIS — I10 Essential (primary) hypertension: Secondary | ICD-10-CM | POA: Diagnosis not present

## 2023-02-28 DIAGNOSIS — E669 Obesity, unspecified: Secondary | ICD-10-CM | POA: Diagnosis not present

## 2023-02-28 DIAGNOSIS — I2510 Atherosclerotic heart disease of native coronary artery without angina pectoris: Secondary | ICD-10-CM

## 2023-02-28 DIAGNOSIS — M25562 Pain in left knee: Secondary | ICD-10-CM

## 2023-02-28 DIAGNOSIS — E785 Hyperlipidemia, unspecified: Secondary | ICD-10-CM | POA: Diagnosis not present

## 2023-02-28 NOTE — Assessment & Plan Note (Signed)
Check HgbA1c. Loose wt

## 2023-02-28 NOTE — Assessment & Plan Note (Signed)
Cont on Repatha injections, ASA

## 2023-02-28 NOTE — Assessment & Plan Note (Signed)
BP Readings from Last 3 Encounters:  02/28/23 126/80  01/04/23 114/80  12/13/22 138/80

## 2023-02-28 NOTE — Progress Notes (Signed)
Subjective:  Patient ID: [name], male    DOB: 1950-12-23  Age: 72 y.o. MRN: [id number]  CC: Medical Management of Chronic Issues (3 month f/u)   HPI [name] presents for OA, CAD, HTN Using Castor oil on the knees  Outpatient Medications Prior to Visit  Medication Sig Dispense Refill   acetaminophen (TYLENOL) 500 MG tablet Take 500 mg by mouth every 6 (six) hours as needed for headache.     ALPRAZolam (XANAX) 0.5 MG tablet Take 1 tablet by mouth twice daily as needed for anxiety 60 tablet 1   aspirin 81 MG tablet Take 81 mg by mouth at bedtime.     budesonide-formoterol (SYMBICORT) 80-4.5 MCG/ACT inhaler Inhale 2 puffs into the lungs every 12 (twelve) hours. 10.2 g 6   Cyanocobalamin (VITAMIN B-12) 2500 MCG SUBL Place 5,000 mcg under the tongue daily.     escitalopram (LEXAPRO) 10 MG tablet Take 1 tablet by mouth once daily 90 tablet 1   Evolocumab (REPATHA SURECLICK) 140 MG/ML SOAJ Inject 1 mL into the skin every 14 (fourteen) days. 2 mL 11   fluticasone (FLONASE) 50 MCG/ACT nasal spray Use 1 spray(s) in each nostril once daily 16 g 5   loratadine (EQ LORATADINE) 10 MG tablet TAKE 1 TABLET BY MOUTH ONCE DAILY AS NEEDED FOR ALLERGIES 90 tablet 3   meloxicam (MOBIC) 7.5 MG tablet Take 2 tablets by mouth once daily (Patient taking differently: Take 7.5 mg by mouth daily as needed.) 180 tablet 0   Multiple Vitamin (MULTIVITAMIN WITH MINERALS) TABS tablet Take 1 tablet by mouth daily.     nitroGLYCERIN (NITROSTAT) 0.4 MG SL tablet Place 1 tablet (0.4 mg total) under the tongue every 5 (five) minutes as needed for chest pain. 25 tablet 9   pantoprazole (PROTONIX) 40 MG tablet Take 1 tablet by mouth twice daily 180 tablet 3   valACYclovir (VALTREX) 500 MG tablet Take 1 tablet by mouth once daily 90 tablet 0   No facility-administered medications prior to visit.    ROS: Review of Systems  Constitutional:  Positive for unexpected weight change. Negative for appetite change and  fatigue.  HENT:  Negative for congestion, nosebleeds, sneezing, sore throat and trouble swallowing.   Eyes:  Negative for itching and visual disturbance.  Respiratory:  Negative for cough.   Cardiovascular:  Negative for chest pain, palpitations and leg swelling.  Gastrointestinal:  Negative for abdominal distention, blood in stool, diarrhea and nausea.  Genitourinary:  Negative for frequency and hematuria.  Musculoskeletal:  Positive for arthralgias, back pain and gait problem. Negative for joint swelling and neck pain.  Skin:  Negative for rash.  Neurological:  Negative for dizziness, tremors, speech difficulty and weakness.  Psychiatric/Behavioral:  Negative for agitation, dysphoric mood, sleep disturbance and suicidal ideas. The patient is nervous/anxious.     Objective:  BP 126/80 (BP Location: Left Arm, Patient Position: Sitting, Cuff Size: Large)   Pulse 67   Temp 97.6 F (36.4 C) (Temporal)   Ht 6' (1.829 m)   Wt 233 lb (105.7 kg)   SpO2 96%   BMI 31.60 kg/m   BP Readings from Last 3 Encounters:  02/28/23 126/80  01/04/23 114/80  12/13/22 138/80    Wt Readings from Last 3 Encounters:  02/28/23 233 lb (105.7 kg)  01/04/23 225 lb (102.1 kg)  12/13/22 231 lb (104.8 kg)    Physical Exam Constitutional:      General: He is not in acute distress.  Appearance: He is well-developed. He is obese.     Comments: NAD  Eyes:     Conjunctiva/sclera: Conjunctivae normal.     Pupils: Pupils are equal, round, and reactive to light.  Neck:     Thyroid: No thyromegaly.     Vascular: No JVD.  Cardiovascular:     Rate and Rhythm: Normal rate and regular rhythm.     Heart sounds: Normal heart sounds. No murmur heard.    No friction rub. No gallop.  Pulmonary:     Effort: Pulmonary effort is normal. No respiratory distress.     Breath sounds: Normal breath sounds. No wheezing or rales.  Chest:     Chest wall: No tenderness.  Abdominal:     General: Bowel sounds are normal.  There is no distension.     Palpations: Abdomen is soft. There is no mass.     Tenderness: There is no abdominal tenderness. There is no guarding or rebound.  Musculoskeletal:        General: Tenderness present. Normal range of motion.     Cervical back: Normal range of motion.  Lymphadenopathy:     Cervical: No cervical adenopathy.  Skin:    General: Skin is warm and dry.     Findings: No rash.  Neurological:     Mental Status: He is alert and oriented to person, place, and time.     Cranial Nerves: No cranial nerve deficit.     Motor: No abnormal muscle tone.     Coordination: Coordination normal.     Gait: Gait normal.     Deep Tendon Reflexes: Reflexes are normal and symmetric.  Psychiatric:        Behavior: Behavior normal.        Thought Content: Thought content normal.        Judgment: Judgment normal.   B knees w/pain 1 cm lipoma -lower abdomen   Lab Results  Component Value Date   WBC 6.5 07/13/2022   HGB 15.0 07/13/2022   HCT 43.3 07/13/2022   PLT 269 07/13/2022   GLUCOSE 112 (H) 07/13/2022   CHOL 191 07/13/2022   TRIG 231 (H) 07/13/2022   HDL 44 07/13/2022   LDLDIRECT 110 (H) 10/07/2019   LDLCALC 107 (H) 07/13/2022   ALT 24 07/13/2022   AST 23 07/13/2022   NA 140 07/13/2022   K 4.9 07/13/2022   CL 101 07/13/2022   CREATININE 0.83 07/13/2022   BUN 17 07/13/2022   CO2 24 07/13/2022   TSH 3.650 07/13/2022   PSA 1.18 07/28/2020   INR 1.0 08/29/2017   HGBA1C 6.0 (H) 07/13/2022    CT CHEST WO CONTRAST  Result Date: 08/04/2022 CLINICAL DATA:  Follow-up of aortic aneurysm identified on echo. EXAM: CT CHEST WITHOUT CONTRAST TECHNIQUE: Multidetector CT imaging of the chest was performed following the standard protocol without IV contrast. RADIATION DOSE REDUCTION: This exam was performed according to the departmental dose-optimization program which includes automated exposure control, adjustment of the mA and/or kV according to patient size and/or use of  iterative reconstruction technique. COMPARISON:  11/12/2015 FINDINGS: Cardiovascular: Normal caliber of the great vessels. Aortic atherosclerosis. Ascending aorta measures 4.1 cm just above the sino-tubular junction on transverse image 73/2 and 4.2 cm in the mid ascending segment on transverse image 63/2. Compare maximally 4.1 cm on the prior. Based on coronal reformats, the aorta measures 4.2 cm in the mid ascending segment on image 88/5. 3.1 cm at the sinuses of Valsalva and 3.2 cm  at the sinotubular junction. Normal transverse and descending aortic caliber. Mild cardiomegaly with LAD and right coronary artery calcification. Aortic valve calcification suggest valvular disease. Pulmonary artery enlargement, outflow tract 3.4 cm. Mediastinum/Nodes: No mediastinal or definite hilar adenopathy, given limitations of unenhanced CT. Lungs/Pleura: No pleural fluid. Left hemidiaphragm elevation. Mild centrilobular and paraseptal emphysema. 2-3 mm right middle lobe pulmonary nodule on 59/4 was present on 11/12/2015 and is presumed benign. Upper Abdomen: Normal imaged portions of the liver, spleen, stomach, pancreas, adrenal glands, kidneys. Musculoskeletal: No acute osseous abnormality. IMPRESSION: 1. 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 Slightly increased compared to 2016. Recommend annual imaging followup by CTA or MRA. This recommendation follows 2010 ACCF/AHA/AATS/ACR/ASA/SCA/SCAI/SIR/STS/SVM Guidelines for the Diagnosis and Management of Patients with Thoracic Aortic Disease. Circulation. 2010; 121: B147-W295. Aortic aneurysm NOS (ICD10-I71.9) 2.  No acute process in the chest. 3. Aortic atherosclerosis (ICD10-I70.0), coronary artery atherosclerosis and emphysema (ICD10-J43.9). 4. Pulmonary artery enlargement suggests pulmonary arterial hypertension. Electronically Signed   By: Jeronimo Greaves M.D.   On: 08/04/2022 12:17    Assessment & Plan:   Problem List Items Addressed This Visit       Cardiovascular  and Mediastinum   Aortic atherosclerosis    Cont on Repatha injections, ASA      CAD (coronary artery disease) - Primary    Cont on Repatha injections, ASA      Relevant Orders   TSH   Urinalysis   CBC with Differential/Platelet   Lipid panel   Comprehensive metabolic panel   Hemoglobin A1c   Essential hypertension    BP Readings from Last 3 Encounters:  02/28/23 126/80  01/04/23 114/80  12/13/22 138/80          Genitourinary   Bladder neck obstruction   Relevant Orders   PSA     Other   Dyslipidemia   Relevant Orders   TSH   Urinalysis   CBC with Differential/Platelet   Lipid panel   Comprehensive metabolic panel   Hemoglobin A1c   Fatigue   Relevant Orders   TSH   Urinalysis   CBC with Differential/Platelet   Lipid panel   PSA   Comprehensive metabolic panel   Hemoglobin A1c   Hyperglycemia    Check HgbA1c. Loose wt       Relevant Orders   Hemoglobin A1c   KNEE PAIN    Continue on Tramadol prn - very rare use. Using Castor oil on the knees now.  Potential benefits of a long term tramadol use as well as potential risks (i.e. addiction risk, apnea etc) and complications (i.e. Somnolence, constipation and others) were explained to the patient and were aknowledged.      Obesity (BMI 30.0-34.9)    Worse On diet         No orders of the defined types were placed in this encounter.     Follow-up: Return in about 4 months (around 06/30/2023) for a follow-up visit.  Sonda Primes, MD

## 2023-02-28 NOTE — Assessment & Plan Note (Signed)
Continue on Tramadol prn - very rare use. Using Castor oil on the knees now.  Potential benefits of a long term tramadol use as well as potential risks (i.e. addiction risk, apnea etc) and complications (i.e. Somnolence, constipation and others) were explained to the patient and were aknowledged.

## 2023-02-28 NOTE — Assessment & Plan Note (Signed)
Cont on Repatha injections, ASA 

## 2023-02-28 NOTE — Assessment & Plan Note (Signed)
Worse On diet

## 2023-03-01 ENCOUNTER — Ambulatory Visit: Payer: Medicare HMO | Admitting: Internal Medicine

## 2023-03-13 NOTE — Progress Notes (Unsigned)
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      History of Present Illness  01/04/2023  Pulmonary/ 1st office eval/Sunni Richardson  Chief Complaint  Patient presents with   Pulmonary Consult    Referred by Dr. Posey Rea.  Pt c/o cough and chest congestion over the past several months. He has some PND. Coughing up 6-8 ounces per day of white sputum. He has occ SOB walking up stairs.   Dyspnea:  MMRC1 = can walk nl pace, flat grade, can't hurry or go uphills or steps s sob   Cough: fits of coughing usually minimal < 1 tsp white s obvious trigger, some better since wearing protective measures when grinding rock dust  Sleep: wakes him up at least once night  SABA use: not helping  Has gerd on PPI bid per Marina Goodell  Some better on pred, no better on advair  Rec Breztri (Symbicort 80) one every 12 hours until return  Work on inhaler technique:  Continue pantoprazole 40 mg Take 30- 60 min before your first and last meals of the day  GERD diet reviewed, bed blocks rec   Please schedule a follow up office visit in 6 weeks, call sooner if needed with all medications /inhalers/ solutions in hand   03/14/2023  f/u ov/Niajah Sipos re: ***   maint on *** did *** bring meds No chief complaint on file.   Dyspnea:  *** Cough: *** Sleeping: *** SABA use: *** 02: *** Covid status:   *** Lung cancer screening :  ***    No obvious day to day or daytime variability or assoc excess/ purulent sputum or mucus plugs or hemoptysis or cp or chest tightness, subjective wheeze or overt sinus or hb symptoms.   *** without nocturnal  or early am exacerbation  of respiratory  c/o's or need for noct saba. Also denies any obvious fluctuation of symptoms with weather or environmental  changes or other aggravating or alleviating factors except as outlined above   No unusual exposure hx or h/o childhood pna/ asthma or knowledge of premature birth.  Current Allergies, Complete Past Medical History, Past Surgical History, Family History, and Social History were reviewed in [employer] record.  ROS  The following are not active complaints unless bolded Hoarseness, sore throat, dysphagia, dental problems, itching, sneezing,  nasal congestion or discharge of excess mucus or purulent secretions, ear ache,   fever, chills, sweats, unintended wt loss or wt gain, classically pleuritic or exertional cp,  orthopnea pnd or arm/hand swelling  or leg swelling, presyncope, palpitations, abdominal pain, anorexia, nausea, vomiting, diarrhea  or change in bowel habits or change in bladder habits, change in stools or change in urine, dysuria, hematuria,  rash, arthralgias, visual complaints, headache, numbness, weakness or ataxia or problems with walking or coordination,  change in mood or  memory.        No outpatient medications have been marked as taking for the 03/14/23 encounter (Appointment) with Nyoka Cowden, MD.          Past Medical History:  Diagnosis Date   Actinic keratosis    Acute sinusitis 06/21/2010   9/14, 10/19    Allergy    Anxiety  08/24/2017   Chronic  Worse in 10/18 Xanax prn - d/c  Potential benefits of a long term benzodiazepines  use as well as potential risks  and complications were explained to the patient and were aknowledged. 11/18 Lexapro     Arthralgia 04/14/2017   2018 2019 Rheum consult; Gluten free trial Dr Corliss Skains 12[facility] Depo-medrol IM Increase Tramadol w/caution  Potential benefits of a long term opioids use as well as potential risks (i.e. addiction risk, apnea etc) and complications (i.e. Somnolence, constipation and others) were explained to the patient and were aknowledged.    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 Pravastatin - pt stopped due to side effects 1/21 Zetia - causing pain too Krill oil d/c.  Repatha option discussed.  9/21 Vascepa, Zetia   Chest pain 12/07/2020   Chondrocalcinosis 07/06/2018   Chronic sinusitis 08/12/2013    Sinus congestion, SOB - better after surgery - nov 2014 9/14 worse CT IMPRESSION:  Minimal sinus mucosal thickening, primarily in the frontal recesses.  Small right maxillary mucous retention cyst. No CT evidence of acute  sinusitis.  Electronically Signed  By: Augusto Gamble M.D.  On: 08/15/2013 09:19  Doxy x 4 wks   Colonic polyp    COLONIC POLYPS, HX OF    CONTACT DERMATITIS DUE TO SOLVENTS    DDD (degenerative disc disease), cervical 07/06/2018   DDD (degenerative disc disease), lumbar 07/06/2018   and facet joint arthropathy   Dizzinesses 07/18/2017   9/18 I suggested a brain MRI to r/o acustic neuroma etc: he will think about it and check on cost ENT ref suggested   DOE (dyspnea on exertion) 07/25/2012   related to eating shrimp   Dyslipidemia 06/21/2013   Chronic  Elev TG, low HDL, nl LDL Pravastatin - pt stopped due to side effects 1/21 Repatha option discussed. On Zetia - c/o side effeects 9/21 Vascepa, Zetia   Dyspnea 07/25/2012   related to eating shrimp   Essential hypertension 07/29/2021   Fatigue 06/09/2015   2016 no OSA  2018 much better (60%) off Reglan.    Folliculitis 11/19/2018   GERD 05/09/2008   Chronic  -- Protonix 5/18 We discussed his  GERD/Barrett's treated at [facility] (Dr Noe Gens). Reglan was d/c'd due to CP. He was dx'd w/pancreatitis and started on Zenpep He had an abd CT, [place], EGD and colonoscopy. 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 Try Flexeril at hs.   KNEE PAIN    Laryngopharyngeal reflux (LPR) 10/02/2018   Localized swelling, mass or lump of neck 09/05/2018   Low back pain 06/09/2015   Per Dr Charlann Boxer 3/17 - planning to see Dr Jordan Likes Body pains are much better (60%) off Reglan. Off Tramadol 2019 Rheum consult; Gluten free trial Tramadol  Potential benefits of a long term opioids use as well as potential risks (i.e. addiction risk, apnea etc) and complications (i.e. Somnolence, constipation and others) were explained to the patient and were aknowledged.   Nausea 07/18/2017   Chronic w/dizziness I suggested a brain MRI to r/o acustic neuroma etc: he will think about it and check on cost 9/18   Neoplasm of uncertain behavior of skin    OA (osteoarthritis) of knee    Obesity (BMI 30.0-34.9) 01/07/2021   ONYCHOMYCOSIS    OSA (obstructive sleep apnea) 08/21/2013   2014 no need for CPAP    Preop exam for internal medicine    Primary osteoarthritis of both hands 11/21/2008   Chronic and severe Tramadol  Potential benefits of a long term opioids use as well as potential risks (i.e. addiction risk, apnea etc) and complications (i.e. Somnolence, constipation and others) were explained to the patient and were aknowledged.   10/18 Body pains are much better (60%) off Reglan. 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 Xanax prn - d/c  Potential benefits of a long term benzodiazepines  use as well as potential risks  and complications were explained to the patient and were aknowledged. Neurol ref - Dr Tat:   "Hi   Umbilical hernia 05/24/2022   Relapsed Surgical ref   Upper respiratory infection 07/27/2018   9/19, 12/21   Well adult exam        Objective:     Wt Readings from Last 3 Encounters:  02/28/23 233 lb (105.7 kg)  01/04/23 225 lb (102.1 kg)  12/13/22 231 lb (104.8 kg)      Vital signs reviewed  03/14/2023  - Note at rest 02 sats  ***% on ***   General appearance:    ***         Assessment

## 2023-03-14 ENCOUNTER — Encounter: Payer: Self-pay | Admitting: Internal Medicine

## 2023-03-14 ENCOUNTER — Ambulatory Visit: Payer: Medicare HMO | Admitting: Internal Medicine

## 2023-03-14 VITALS — BP 122/68 | HR 74 | Temp 98.5°F | Ht 72.0 in | Wt 230.8 lb

## 2023-03-14 DIAGNOSIS — J4521 Mild intermittent asthma with (acute) exacerbation: Secondary | ICD-10-CM | POA: Diagnosis not present

## 2023-03-14 DIAGNOSIS — J0101 Acute recurrent maxillary sinusitis: Secondary | ICD-10-CM

## 2023-03-14 NOTE — Patient Instructions (Signed)
I will call you with the results of the bloodwork you have at [facility] lab  My office will be contacting you by phone for referral to CT sinus   - if you don't hear back from my office within one week please call us back or notify us thru MyChart and we'll address it right away.    If you are satisfied with your treatment plan,  let your doctor know and he/she can either refill your medications or you can return here when your prescription runs out.     If in any way you are not 100% satisfied,  please tell us.  If 100% better, tell your friends!  Pulmonary follow up is as needed

## 2023-03-15 ENCOUNTER — Encounter: Payer: Self-pay | Admitting: Internal Medicine

## 2023-03-15 NOTE — Assessment & Plan Note (Signed)
Onset fall 2023 much worse since 10/2022 chest congestion, cough: was using a jack hammer on quartz - breathing dust... Use a face mask, filters, face shield Chest CT in 08/03/22  was OK. Medrol pac Advair bid> d/c 01/04/2023 and changed to symbicort 80 one bid x 6 week trial (sample of breztri to get him started on the technique > improved 03/14/2023 > continue symb 80 2bid and pulmonary f/u prn   I suspect his residual symptoms are related to rhinosinusitis so rec allergy screen and Sinus with f/u by allergy or ENT respectively if indicated by labs or symptoms remain refractory.          Each maintenance medication was reviewed in detail including emphasizing most importantly the difference between maintenance and prns and under what circumstances the prns are to be triggered using an action plan format where appropriate.  Total time for H and P, chart review, counseling, reviewing hfa  device(s) and generating customized AVS unique to this office visit / same day charting = 32 min final summary f/u ov

## 2023-03-15 NOTE — Assessment & Plan Note (Signed)
Sinus ct ordered 03/14/2023

## 2023-03-21 NOTE — Addendum Note (Signed)
Addended by: Lajoyce Lauber A on: 03/21/2023 11:10 AM   Modules accepted: Orders

## 2023-03-23 ENCOUNTER — Other Ambulatory Visit: Payer: Self-pay | Admitting: Internal Medicine

## 2023-03-27 ENCOUNTER — Other Ambulatory Visit (INDEPENDENT_AMBULATORY_CARE_PROVIDER_SITE_OTHER): Payer: Medicare HMO

## 2023-03-27 DIAGNOSIS — R739 Hyperglycemia, unspecified: Secondary | ICD-10-CM | POA: Diagnosis not present

## 2023-03-27 DIAGNOSIS — E785 Hyperlipidemia, unspecified: Secondary | ICD-10-CM

## 2023-03-27 DIAGNOSIS — R5382 Chronic fatigue, unspecified: Secondary | ICD-10-CM

## 2023-03-27 DIAGNOSIS — I2510 Atherosclerotic heart disease of native coronary artery without angina pectoris: Secondary | ICD-10-CM

## 2023-03-27 DIAGNOSIS — N320 Bladder-neck obstruction: Secondary | ICD-10-CM

## 2023-03-27 LAB — URINALYSIS
Bilirubin Urine: NEGATIVE
Ketones, ur: NEGATIVE
Leukocytes,Ua: NEGATIVE
Nitrite: NEGATIVE
Specific Gravity, Urine: 1.01 (ref 1.000–1.030)
Total Protein, Urine: NEGATIVE
Urine Glucose: NEGATIVE
Urobilinogen, UA: 0.2 (ref 0.0–1.0)
pH: 7 (ref 5.0–8.0)

## 2023-03-27 LAB — CBC WITH DIFFERENTIAL/PLATELET
Basophils Absolute: 0 10*3/uL (ref 0.0–0.1)
Basophils Relative: 0.6 % (ref 0.0–3.0)
Eosinophils Absolute: 0.2 10*3/uL (ref 0.0–0.7)
Eosinophils Relative: 3.3 % (ref 0.0–5.0)
HCT: 44.4 % (ref 39.0–52.0)
Hemoglobin: 15 g/dL (ref 13.0–17.0)
Lymphocytes Relative: 38.8 % (ref 12.0–46.0)
Lymphs Abs: 2.5 10*3/uL (ref 0.7–4.0)
MCHC: 33.7 g/dL (ref 30.0–36.0)
MCV: 96.2 fl (ref 78.0–100.0)
Monocytes Absolute: 0.6 10*3/uL (ref 0.1–1.0)
Monocytes Relative: 9 % (ref 3.0–12.0)
Neutro Abs: 3.2 10*3/uL (ref 1.4–7.7)
Neutrophils Relative %: 48.3 % (ref 43.0–77.0)
Platelets: 255 10*3/uL (ref 150.0–400.0)
RBC: 4.62 Mil/uL (ref 4.22–5.81)
RDW: 13.5 % (ref 11.5–15.5)
WBC: 6.5 10*3/uL (ref 4.0–10.5)

## 2023-03-27 LAB — LIPID PANEL
Cholesterol: 122 mg/dL (ref 0–200)
HDL: 42.3 mg/dL (ref >39.00)
LDL Cholesterol: 57 mg/dL (ref 0–99)
NonHDL: 80.09
Total CHOL/HDL Ratio: 3
Triglycerides: 113 mg/dL (ref 0.0–149.0)
VLDL: 22.6 mg/dL (ref 0.0–40.0)

## 2023-03-27 LAB — COMPREHENSIVE METABOLIC PANEL
ALT: 24 U/L (ref 0–53)
AST: 20 U/L (ref 0–37)
Albumin: 4.1 g/dL (ref 3.5–5.2)
Alkaline Phosphatase: 65 U/L (ref 39–117)
BUN: 14 mg/dL (ref 6–23)
CO2: 24 mEq/L (ref 19–32)
Calcium: 9.2 mg/dL (ref 8.4–10.5)
Chloride: 104 mEq/L (ref 96–112)
Creatinine, Ser: 0.76 mg/dL (ref 0.40–1.50)
GFR: 91.57 mL/min (ref >60.00)
Glucose, Bld: 116 mg/dL — ABNORMAL HIGH (ref 70–99)
Potassium: 4.3 mEq/L (ref 3.5–5.1)
Sodium: 139 mEq/L (ref 135–145)
Total Bilirubin: 0.8 mg/dL (ref 0.2–1.2)
Total Protein: 7.1 g/dL (ref 6.0–8.3)

## 2023-03-27 LAB — PSA: PSA: 0.83 ng/mL (ref 0.10–4.00)

## 2023-03-27 LAB — HEMOGLOBIN A1C: Hgb A1c MFr Bld: 5.8 % (ref 4.6–6.5)

## 2023-03-27 LAB — TSH: TSH: 3.03 u[IU]/mL (ref 0.35–5.50)

## 2023-03-30 ENCOUNTER — Ambulatory Visit (HOSPITAL_BASED_OUTPATIENT_CLINIC_OR_DEPARTMENT_OTHER)
Admission: RE | Admit: 2023-03-30 | Discharge: 2023-03-30 | Disposition: A | Discharge status: Home / Self Care | Payer: Medicare HMO | Source: Ambulatory Visit | Attending: Internal Medicine | Admitting: Internal Medicine

## 2023-03-30 DIAGNOSIS — J342 Deviated nasal septum: Secondary | ICD-10-CM | POA: Diagnosis not present

## 2023-03-30 DIAGNOSIS — J0101 Acute recurrent maxillary sinusitis: Secondary | ICD-10-CM | POA: Insufficient documentation

## 2023-05-16 ENCOUNTER — Other Ambulatory Visit: Payer: Self-pay | Admitting: Internal Medicine

## 2023-06-14 ENCOUNTER — Encounter (INDEPENDENT_AMBULATORY_CARE_PROVIDER_SITE_OTHER): Payer: Self-pay

## 2023-06-20 ENCOUNTER — Other Ambulatory Visit: Payer: Self-pay | Admitting: Internal Medicine

## 2023-06-28 ENCOUNTER — Other Ambulatory Visit: Payer: Self-pay | Admitting: Cardiology

## 2023-06-28 DIAGNOSIS — E785 Hyperlipidemia, unspecified: Secondary | ICD-10-CM

## 2023-06-28 DIAGNOSIS — I25119 Atherosclerotic heart disease of native coronary artery with unspecified angina pectoris: Secondary | ICD-10-CM

## 2023-06-28 DIAGNOSIS — I700 Atherosclerosis of aorta: Secondary | ICD-10-CM

## 2023-07-07 ENCOUNTER — Encounter: Payer: Self-pay | Admitting: Pharmacist

## 2023-07-07 DIAGNOSIS — T466X5A Adverse effect of antihyperlipidemic and antiarteriosclerotic drugs, initial encounter: Secondary | ICD-10-CM

## 2023-07-07 NOTE — Progress Notes (Signed)
[facility])     [facility] Quality Pharmacy Team Statin Quality Measure Assessment  07/07/2023  [name] May 09, 1951 [id number]  Per review of chart and payor information, Mr. Glasier has a diagnosis of cardiovascular disease but is not currently filling a statin prescription. This places patient into the [facility] (Statin Use In Patients with Cardiovascular Disease) measure for CMS.    Patient has documented trials of statins with reported myalgias, but no corresponding CPT codes that would exclude patient from [facility] measure.  Last year, Mr. Giannini had the diagnosis code M79.10 myalgias added to an office to remove him from the measure.  If this is still clinically applicable, please consider adding this to [facility] office visit to remove him from the measure for 2024.  Coding for statin intolerance is required annually.  Code for past statin intolerance  (required annually)  Provider Requirements: Must associate code during an office visit or telehealth encounter   Drug Induced Myopathy G72.0   Myalgia (SPC ONLY) M79.1   Myositis, unspecified M60.9   Myopathy, unspecified G72.9   Rhabdomyolysis M62.82   Thank you for allowing [facility] to be a part of this patient's care.  Dellie Burns, PharmD Clinical Pharmacist [place]  Direct Dial: [phone]

## 2023-07-10 ENCOUNTER — Ambulatory Visit: Payer: Medicare HMO | Admitting: Internal Medicine

## 2023-07-10 ENCOUNTER — Encounter: Payer: Self-pay | Admitting: Internal Medicine

## 2023-07-10 VITALS — BP 138/86 | HR 73 | Temp 98.6°F | Ht 72.0 in

## 2023-07-10 DIAGNOSIS — I10 Essential (primary) hypertension: Secondary | ICD-10-CM

## 2023-07-10 DIAGNOSIS — M25561 Pain in right knee: Secondary | ICD-10-CM

## 2023-07-10 DIAGNOSIS — R739 Hyperglycemia, unspecified: Secondary | ICD-10-CM | POA: Diagnosis not present

## 2023-07-10 DIAGNOSIS — M5440 Lumbago with sciatica, unspecified side: Secondary | ICD-10-CM

## 2023-07-10 DIAGNOSIS — M25562 Pain in left knee: Secondary | ICD-10-CM | POA: Diagnosis not present

## 2023-07-10 DIAGNOSIS — M170 Bilateral primary osteoarthritis of knee: Secondary | ICD-10-CM

## 2023-07-10 MED ORDER — MELOXICAM 7.5 MG PO TABS: 7.5000 mg | ORAL_TABLET | Freq: Every day | ORAL | 0 refills | Status: DC

## 2023-07-10 NOTE — Assessment & Plan Note (Signed)
Using castor oil w/cayenne pepper Cont on Tramadol  Potential benefits of a long term opioids use as well as potential risks (i.e. addiction risk, apnea etc) and complications (i.e. Somnolence, constipation and others) were explained to the patient and were aknowledged.

## 2023-07-10 NOTE — Assessment & Plan Note (Signed)
Using castor oil, inversion table, cayenne pepper Cont on Tramadol  Potential benefits of a long term opioids use as well as potential risks (i.e. addiction risk, apnea etc) and complications (i.e. Somnolence, constipation and others) were explained to the patient and were aknowledged.

## 2023-07-10 NOTE — Assessment & Plan Note (Signed)
BP Readings from Last 3 Encounters:  07/10/23 138/86  03/14/23 122/68  02/28/23 126/80

## 2023-07-10 NOTE — Progress Notes (Signed)
Subjective:  Patient ID: [name], male    DOB: March 08, 1951  Age: 72 y.o. MRN: [id number]  CC: Follow-up (4 MNTH F/U)   HPI [name] presents for OA, anxiety Using castor oil, inversion table, cayenne pepper  Outpatient Medications Prior to Visit  Medication Sig Dispense Refill   acetaminophen (TYLENOL) 500 MG tablet Take 500 mg by mouth every 6 (six) hours as needed for headache.     ALPRAZolam (XANAX) 0.5 MG tablet Take 1 tablet by mouth twice daily as needed for anxiety 60 tablet 0   aspirin 81 MG tablet Take 81 mg by mouth at bedtime.     budesonide-formoterol (SYMBICORT) 80-4.5 MCG/ACT inhaler Inhale 2 puffs into the lungs every 12 (twelve) hours. 10.2 g 6   Cyanocobalamin (VITAMIN B-12) 2500 MCG SUBL Place 5,000 mcg under the tongue daily.     EQ ALL DAY ALLERGY RELIEF 10 MG tablet TAKE 1 TABLET BY MOUTH ONCE DAILY AS NEEDED FOR ALLERGIES 90 tablet 0   escitalopram (LEXAPRO) 10 MG tablet Take 1 tablet by mouth once daily 90 tablet 1   fluticasone (FLONASE) 50 MCG/ACT nasal spray Use 1 spray(s) in each nostril once daily 16 g 5   Multiple Vitamin (MULTIVITAMIN WITH MINERALS) TABS tablet Take 1 tablet by mouth daily.     nitroGLYCERIN (NITROSTAT) 0.4 MG SL tablet Place 1 tablet (0.4 mg total) under the tongue every 5 (five) minutes as needed for chest pain. 25 tablet 9   pantoprazole (PROTONIX) 40 MG tablet Take 1 tablet by mouth twice daily 180 tablet 3   REPATHA SURECLICK 140 MG/ML SOAJ INJECT INTO THE SKIN EVERY 14 DAYS 2 mL 0   valACYclovir (VALTREX) 500 MG tablet Take 1 tablet by mouth once daily 90 tablet 1   meloxicam (MOBIC) 7.5 MG tablet Take 2 tablets by mouth once daily (Patient taking differently: Take 7.5 mg by mouth daily as needed.) 180 tablet 0   No facility-administered medications prior to visit.    ROS: Review of Systems  Constitutional:  Negative for appetite change, fatigue and unexpected weight change.  HENT:  Negative for congestion,  nosebleeds, sneezing, sore throat and trouble swallowing.   Eyes:  Negative for itching and visual disturbance.  Respiratory:  Positive for cough and shortness of breath.   Cardiovascular:  Negative for chest pain, palpitations and leg swelling.  Gastrointestinal:  Negative for abdominal distention, blood in stool, diarrhea and nausea.  Genitourinary:  Negative for frequency and hematuria.  Musculoskeletal:  Positive for arthralgias, back pain and gait problem. Negative for joint swelling and neck pain.  Skin:  Negative for rash.  Neurological:  Negative for dizziness, tremors, speech difficulty and weakness.  Psychiatric/Behavioral:  Negative for agitation, dysphoric mood, sleep disturbance and suicidal ideas. The patient is not nervous/anxious.     Objective:  BP 138/86 (BP Location: Right Arm, Patient Position: Sitting, Cuff Size: Normal)   Pulse 73   Temp 98.6 F (37 C) (Oral)   Ht 6' (1.829 m)   SpO2 94%   BMI 31.30 kg/m   BP Readings from Last 3 Encounters:  07/10/23 138/86  03/14/23 122/68  02/28/23 126/80    Wt Readings from Last 3 Encounters:  03/14/23 230 lb 12.8 oz (104.7 kg)  02/28/23 233 lb (105.7 kg)  01/04/23 225 lb (102.1 kg)    Physical Exam Constitutional:      General: He is not in acute distress.    Appearance: He is well-developed.  Comments: NAD  Eyes:     Conjunctiva/sclera: Conjunctivae normal.     Pupils: Pupils are equal, round, and reactive to light.  Neck:     Thyroid: No thyromegaly.     Vascular: No JVD.  Cardiovascular:     Rate and Rhythm: Normal rate and regular rhythm.     Heart sounds: Murmur heard.     No friction rub. No gallop.  Pulmonary:     Effort: Pulmonary effort is normal. No respiratory distress.     Breath sounds: Normal breath sounds. No wheezing or rales.  Chest:     Chest wall: No tenderness.  Abdominal:     General: Bowel sounds are normal. There is no distension.     Palpations: Abdomen is soft. There is no  mass.     Tenderness: There is no abdominal tenderness. There is no guarding or rebound.  Musculoskeletal:        General: No tenderness. Normal range of motion.     Cervical back: Normal range of motion.  Lymphadenopathy:     Cervical: No cervical adenopathy.  Skin:    General: Skin is warm and dry.     Findings: No rash.  Neurological:     Mental Status: He is alert and oriented to person, place, and time.     Cranial Nerves: No cranial nerve deficit.     Motor: No abnormal muscle tone.     Coordination: Coordination normal.     Gait: Gait normal.     Deep Tendon Reflexes: Reflexes are normal and symmetric.  Psychiatric:        Behavior: Behavior normal.        Thought Content: Thought content normal.        Judgment: Judgment normal.     Lab Results  Component Value Date   WBC 6.5 03/27/2023   HGB 15.0 03/27/2023   HCT 44.4 03/27/2023   PLT 255.0 03/27/2023   GLUCOSE 116 (H) 03/27/2023   CHOL 122 03/27/2023   TRIG 113.0 03/27/2023   HDL 42.30 03/27/2023   LDLDIRECT 110 (H) 10/07/2019   LDLCALC 57 03/27/2023   ALT 24 03/27/2023   AST 20 03/27/2023   NA 139 03/27/2023   K 4.3 03/27/2023   CL 104 03/27/2023   CREATININE 0.76 03/27/2023   BUN 14 03/27/2023   CO2 24 03/27/2023   TSH 3.03 03/27/2023   PSA 0.83 03/27/2023   INR 1.0 08/29/2017   HGBA1C 5.8 03/27/2023    CT MAXILLOFACIAL WO CONTRAST  Result Date: 03/30/2023 CLINICAL DATA:  Acute recurrent maxillary sinusitis EXAM: CT MAXILLOFACIAL WITHOUT CONTRAST TECHNIQUE: Multidetector CT imaging of the maxillofacial structures was performed. Multiplanar CT image reconstructions were also generated. RADIATION DOSE REDUCTION: This exam was performed according to the departmental dose-optimization program which includes automated exposure control, adjustment of the mA and/or kV according to patient size and/or use of iterative reconstruction technique. COMPARISON:  CT sinuses 12/28/2020 FINDINGS: Paranasal sinuses:  Frontal: Normally aerated. Patent frontal sinus drainage pathways. Ethmoid: Normally aerated. Maxillary: Normally aerated. Sphenoid: Normally aerated. Patent sphenoethmoidal recesses. Right ostiomeatal unit: Patent. Left ostiomeatal unit: Patent. Nasal passages: Patent. Intact nasal septum. There is 4 mm of nasal septal deviation to the right. Other: Orbits and intracranial compartment are unremarkable. Visible mastoid air cells are normally aerated. IMPRESSION: 1. Normally aerated paranasal sinuses. Patent sinus drainage pathways. 2. 4 mm of nasal septal deviation to the right. Electronically Signed   By: Darliss Cheney M.D.   On: 03/30/2023  17:07    Assessment & Plan:   Problem List Items Addressed This Visit     Essential hypertension    BP Readings from Last 3 Encounters:  07/10/23 138/86  03/14/23 122/68  02/28/23 126/80         Hyperglycemia   Relevant Orders   Comprehensive metabolic panel   Hemoglobin A1c   KNEE PAIN    Using castor oil w/cayenne pepper Cont on Tramadol  Potential benefits of a long term opioids use as well as potential risks (i.e. addiction risk, apnea etc) and complications (i.e. Somnolence, constipation and others) were explained to the patient and were aknowledged.      Low back pain - Primary    Using castor oil, inversion table, cayenne pepper Cont on Tramadol  Potential benefits of a long term opioids use as well as potential risks (i.e. addiction risk, apnea etc) and complications (i.e. Somnolence, constipation and others) were explained to the patient and were aknowledged.      Relevant Medications   meloxicam (MOBIC) 7.5 MG tablet   OA (osteoarthritis) of knee    Using castor oil w/cayenne pepper Cont on Tramadol  Potential benefits of a long term opioids use as well as potential risks (i.e. addiction risk, apnea etc) and complications (i.e. Somnolence, constipation and others) were explained to the patient and were aknowledged.      Relevant  Medications   meloxicam (MOBIC) 7.5 MG tablet      Meds ordered this encounter  Medications   meloxicam (MOBIC) 7.5 MG tablet    Sig: Take 1-2 tablets (7.5-15 mg total) by mouth daily.    Dispense:  180 tablet    Refill:  0      Follow-up: Return in about 6 months (around 01/10/2024) for a follow-up visit.  Sonda Primes, MD

## 2023-08-01 ENCOUNTER — Other Ambulatory Visit: Payer: Self-pay | Admitting: Cardiology

## 2023-08-01 DIAGNOSIS — E785 Hyperlipidemia, unspecified: Secondary | ICD-10-CM

## 2023-08-01 DIAGNOSIS — I700 Atherosclerosis of aorta: Secondary | ICD-10-CM

## 2023-08-01 DIAGNOSIS — I25119 Atherosclerotic heart disease of native coronary artery with unspecified angina pectoris: Secondary | ICD-10-CM

## 2023-09-13 ENCOUNTER — Telehealth: Payer: Self-pay | Admitting: Internal Medicine

## 2023-09-13 NOTE — Telephone Encounter (Signed)
Inbound call from patient stating he has been having abdominal pain for awhile. States it is now worse and he is having nausea. Patient requesting a call to discuss further. Please advise, thank you.

## 2023-09-13 NOTE — Telephone Encounter (Signed)
Pt states he has been having lower abd pain and his stomach swells some. He states he saw PCP about this but it continues. He also states it hurts when he coughs in his lower abdomen. Reports he is also having nausea. Pt scheduled to see Boone Master PA tomorrow at 9am. Pt aware of appt.

## 2023-09-14 ENCOUNTER — Ambulatory Visit: Payer: Medicare HMO | Admitting: Gastroenterology

## 2023-09-14 ENCOUNTER — Encounter: Payer: Self-pay | Admitting: Gastroenterology

## 2023-09-14 ENCOUNTER — Other Ambulatory Visit: Payer: Self-pay | Admitting: Internal Medicine

## 2023-09-14 VITALS — BP 122/70 | HR 41 | Ht 72.0 in | Wt 228.0 lb

## 2023-09-14 DIAGNOSIS — Z8719 Personal history of other diseases of the digestive system: Secondary | ICD-10-CM | POA: Diagnosis not present

## 2023-09-14 DIAGNOSIS — Z9889 Other specified postprocedural states: Secondary | ICD-10-CM

## 2023-09-14 DIAGNOSIS — R1030 Lower abdominal pain, unspecified: Secondary | ICD-10-CM

## 2023-09-14 NOTE — Progress Notes (Signed)
Chief Complaint: Lower abdominal pain Primary GI MD: Dr. Marina Goodell  HPI: 72 year old Gill history of CAD, hypertension, AAA, and others as listed below presents for evaluation of lower abdominal pain.  Discussed the use of AI scribe software for clinical note transcription with the patient, who gave verbal consent to proceed.  History of Present Illness   The patient, with a history of hernia repair, presents with chronic lower abdominal pain.   The pain, described as a constant nagging sensation, is sometimes localized to the area of the previous hernia repair and at other times encompasses the entire lower abdomen. The pain is not associated with eating or exertion, but was noted to be worse with coughing during a previous episode of sinusitis.   Patient notes he has regular bowel movements without melena or hematochezia  The patient also reports an incident of trauma to the abdomen a couple of months ago when a tree limb under pressure snapped and hit him in the belly, causing a bruise the size of a softball. The patient was already experiencing abdominal pain prior to this incident, but is unsure if the trauma has exacerbated the pain.   The patient also reports a lump in the lower abdomen that has been present for a couple of years and has been noted by previous doctors, but has not been further evaluated.     Patient was seen by PCP and general surgeons who did his hernia repair for this abdominal pain.  He was told by both of them that he needs a repeat hernia repair.  He is reluctant to do so and would like some type of imaging.  He is very active and is currently working on digging an old [place] found in his backyard and this involves 40 pound jackhammers and heavy lifting.  PREVIOUS GI WORKUP   EGD 11/30/2022 for dysphagia/GERD - One benign- appearing, intrinsic moderate stenosis was found 40 cm from the incisors. This stenosis measured 1. 5 cm ( inner diameter) . A TTS dilator  was passed through the scope. Dilation with an 18- 19- Andre mm balloon dilator was performed to Andre mm.  - The exam of the esophagus was otherwise normal.  - The stomach was normal, save small hiatal hernia.  - The examined duodenum was normal.  - The cardia and gastric fundus were normal on retroflexion.  Colonoscopy 11/30/2022 for history of adenomas - Three 2 to 5 mm polyps (tubular adenomas) in the sigmoid colon and in the ascending colon, removed with a cold snare. Resected and retrieved.  - The entire examined colon is normal on direct and retroflexion views. Prior polypectomy scar biopsied. - Repeat 3 years  Colonoscopy 12/06/2019 history of adenomas - One 4 mm polyp (tubular adenoma) in the ascending colon, removed with a cold snare. Resected and retrieved.  - No obvious residual tumor at prior resection site. Scar biopsied (colonic mucosa) - The entire examined colon is otherwise normal on direct and retroflexion views.  Colonoscopy 04/2019 - One Andre mm polyp (tubular adenoma - high grade dysplasia not identified) in the proximal ascending colon, removed using injection- lift and a hot snare. Resected and retrieved. Tattooed.  - One 5 mm polyp (tubular adenoma - high grade dysplasia not identified) in the transverse colon, removed with a cold snare. Resected and retrieved.  - One 1 mm polyp (tubular adenoma - high grade dysplasia not identified) in the descending colon, removed with a jumbo cold forceps. Resected and retrieved.  - The  examination was otherwise normal on direct and retroflexion views.  EGD 04/2019 for barretts (mentioned in 2017/ esophageal reflux) 1. Mild esophagitis without Barrett' s  2. Mild gastritis. Status post CLO biopsy.  3. Otherwise normal EGD.  Past Medical History:  Diagnosis Date   Actinic keratosis    Acute sinusitis 06/21/2010   9/14, 10/19    Allergy    Anxiety 08/24/2017   Chronic  Worse in 10/18 Xanax prn - d/c  Potential benefits of a long term  benzodiazepines  use as well as potential risks  and complications were explained to the patient and were aknowledged. 11/18 Lexapro     Arthralgia 04/14/2017   2018 2019 Rheum consult; Gluten free trial Dr Corliss Skains 12[facility] Depo-medrol IM Increase Tramadol w/caution  Potential benefits of a long term opioids use as well as potential risks (i.e. addiction risk, apnea etc) and complications (i.e. Somnolence, constipation and others) were explained to the patient and were aknowledged.   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 Pravastatin - pt stopped due to side effects 1/21 Zetia - causing pain too Krill oil d/c.  Repatha option discussed.  9/21 Vascepa, Zetia   Chest pain 12/07/2020   Chondrocalcinosis 07/06/2018   Chronic sinusitis 08/12/2013    Sinus congestion, SOB - better after surgery - nov 2014 9/14 worse CT IMPRESSION:  Minimal sinus mucosal thickening, primarily in the frontal recesses.  Small right maxillary mucous retention cyst. No CT evidence of acute  sinusitis.  Electronically Signed  By: Augusto Gamble M.D.  On: 08/15/2013 09:19  Doxy x 4 wks   Colonic polyp    COLONIC POLYPS, HX OF    CONTACT DERMATITIS DUE TO SOLVENTS    DDD (degenerative disc disease), cervical 07/06/2018   DDD (degenerative disc disease), lumbar 07/06/2018   and facet joint arthropathy   Dizzinesses 07/18/2017   9/18 I suggested a brain MRI to r/o acustic neuroma etc: he will think about it and check on  cost ENT ref suggested   DOE (dyspnea on exertion) 07/25/2012   related to eating shrimp   Dyslipidemia 06/21/2013   Chronic  Elev TG, low HDL, nl LDL Pravastatin - pt stopped due to side effects 1/21 Repatha option discussed. On Zetia - c/o side effeects 9/21 Vascepa, Zetia   Dyspnea 07/25/2012   related to eating shrimp   Essential hypertension 07/29/2021   Fatigue 06/09/2015   2016 no OSA  2018 much better (60%) off Reglan.    Folliculitis 11/19/2018   GERD 05/09/2008   Chronic  -- Protonix 5/18 We discussed his GERD/Barrett's treated at [facility] (Dr Noe Gens). Reglan was d/c'd due to CP. He was dx'd w/pancreatitis and started on Zenpep He had an abd CT, [place], EGD and colonoscopy. He would like to have a 2nd opinion here - will refer   GERD (gastroesophageal reflux disease)    Hand eczema 07/07/2015   8/16 dyshidrotic eczema B  HAND PAIN    Heart murmur    HERPES, GENITAL NOS    Hyperglycemia 06/09/2015   Mild     Hypogonadism in male 04/25/2018   Declined testosterone Rx   Impacted cerumen of left ear 12/24/2020   Ingrowing toenail of left foot 07/25/2012   is resolved now 07-25-12   Insomnia 04/18/2022   6/23 Worse due to pain. Try Flexeril at hs.   KNEE PAIN    Laryngopharyngeal reflux (LPR) 10/02/2018   Localized swelling, mass or lump of neck 09/05/2018   Low back pain 06/09/2015   Per Dr Charlann Boxer 3/17 - planning to see Dr Jordan Likes Body pains are much better (60%) off Reglan. Off Tramadol 2019 Rheum consult; Gluten free trial Tramadol  Potential benefits of a long term opioids use as well as potential risks (i.e. addiction risk, apnea etc) and complications (i.e. Somnolence, constipation and others) were explained to the patient and were aknowledged.   Nausea 07/18/2017   Chronic w/dizziness I suggested a brain MRI to r/o acustic neuroma etc: he will think about it and check on cost 9/18   Neoplasm of uncertain behavior of skin    OA (osteoarthritis) of knee    Obesity (BMI  30.0-34.9) 01/07/2021   ONYCHOMYCOSIS    OSA (obstructive sleep apnea) 08/21/2013   2014 no need for CPAP    Preop exam for internal medicine    Primary osteoarthritis of both hands 11/21/2008   Chronic and severe Tramadol  Potential benefits of a long term opioids use as well as potential risks (i.e. addiction risk, apnea etc) and complications (i.e. Somnolence, constipation and others) were explained to the patient and were aknowledged.   10/18 Body pains are much better (60%) off Reglan. 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 Xanax prn - d/c  Potential benefits of a long term benzodiazepines  use as well as potential risks  and complications were explained to the patient and were aknowledged. Neurol ref - Dr Tat:   "Hi   Umbilical hernia 05/24/2022   Relapsed Surgical ref   Upper respiratory infection 07/27/2018   9/19, 12/21   Well adult exam     Past Surgical History:  Procedure Laterality Date   COLONOSCOPY  04/24/2019   JOINT REPLACEMENT  9/13   L TKR   KNEE ARTHROSCOPY     Left   LEFT HEART CATH AND CORONARY ANGIOGRAPHY N/A 08/31/2017   Procedure: LEFT HEART CATH AND CORONARY ANGIOGRAPHY;  Surgeon: Yvonne Kendall, MD;  Location: [facility];  Service: Cardiovascular;  Laterality: N/A;   TOTAL KNEE ARTHROPLASTY  07/31/2012   Procedure: TOTAL KNEE ARTHROPLASTY;  Surgeon: Eugenia Mcalpine, MD;  Location: [facility];   Service: Orthopedics;  Laterality: Left;   UMBILICAL HERNIA REPAIR  07-25-12   10 yrs ago   UPPER GASTROINTESTINAL ENDOSCOPY  04/24/2019  Current Outpatient Medications  Medication Sig Dispense Refill   acetaminophen (TYLENOL) 500 MG tablet Take 500 mg by mouth every 6 (six) hours as needed for headache.     ALPRAZolam (XANAX) 0.5 MG tablet Take 1 tablet by mouth twice daily as needed for anxiety 60 tablet 0   aspirin 81 MG tablet Take 81 mg by mouth at bedtime.     budesonide-formoterol (SYMBICORT) 80-4.5 MCG/ACT inhaler Inhale 2 puffs into the lungs every 12 (twelve) hours. (Patient not taking: Reported on 09/14/2023) 10.2 g 6   Cyanocobalamin (VITAMIN B-12) 2500 MCG SUBL Place 5,000 mcg under the tongue daily.     EQ ALL DAY ALLERGY RELIEF 10 MG tablet TAKE 1 TABLET BY MOUTH ONCE DAILY AS NEEDED FOR ALLERGIES 90 tablet 0   escitalopram (LEXAPRO) 10 MG tablet Take 1 tablet by mouth once daily 90 tablet 1   Evolocumab (REPATHA SURECLICK) 140 MG/ML SOAJ INJECT 1 ML INTO THE SKIN EVERY 14 DAYS 2 mL 11   fluticasone (FLONASE) 50 MCG/ACT nasal spray Use 1 spray(s) in each nostril once daily 16 g 5   meloxicam (MOBIC) 7.5 MG tablet Take 1-2 tablets (7.5-15 mg total) by mouth daily. 180 tablet 0   Multiple Vitamin (MULTIVITAMIN WITH MINERALS) TABS tablet Take 1 tablet by mouth daily.     nitroGLYCERIN (NITROSTAT) 0.4 MG SL tablet Place 1 tablet (0.4 mg total) under the tongue every 5 (five) minutes as needed for chest pain. 25 tablet 9   pantoprazole (PROTONIX) 40 MG tablet Take 1 tablet by mouth twice daily 180 tablet 3   valACYclovir (VALTREX) 500 MG tablet Take 1 tablet by mouth once daily 90 tablet 1   No current facility-administered medications for this visit.    Allergies as of 09/14/2023 - Review Complete 09/14/2023  Allergen Reaction Noted   Hytrin [terazosin] Other (See Comments) 06/09/2015   Icosapent ethyl  08/13/2021   Levaquin [levofloxacin in d5w]  08/24/2018   Lipitor  [atorvastatin]  11/25/2014   Nexletol [bempedoic acid] Nausea And Vomiting 08/24/2021   Penicillins Other (See Comments) 08/02/2011   Reglan [metoclopramide]  04/14/2017   Xanax [alprazolam]  09/29/2017    Family History  Problem Relation Age of Onset   Diabetes Mother 40   Heart disease Father 72       CAD, MI, CHF   Lung cancer Brother        smoker   Coronary artery disease Other    Colon polyps Brother    Heart disease Brother 39       CHF   Colon cancer Brother    Healthy Daughter    Stomach cancer Neg Hx    Rectal cancer Neg Hx    Esophageal cancer Neg Hx     Social History   Socioeconomic History   Marital status: Married    Spouse name: Not on file   Number of children: 2   Years of education: Not on file   Highest education level: Not on file  Occupational History   Occupation: retired  Tobacco Use   Smoking status: Former    Types: Cigars    Quit date: 08/30/2007    Years since quitting: 16.0    Passive exposure: Past   Smokeless tobacco: Never   Tobacco comments:    Marijuana use some days.  Updated 03/14/2023 amy marsh, cma  Vaping Use   Vaping status: Never Used  Substance and Sexual Activity   Alcohol use: Yes    Comment: occ.  Drug use: Yes    Types: Marijuana    Comment: 2-3 days ago   Sexual activity: Yes    Partners: Female    Birth control/protection: None  Other Topics Concern   Not on file  Social History Narrative   Not on file   Social Determinants of Health   Financial Resource Strain: Low Risk  (11/01/2022)   Overall Financial Resource Strain (CARDIA)    Difficulty of Paying Living Expenses: Not hard at all  Food Insecurity: No Food Insecurity (11/01/2022)   Hunger Vital Sign    Worried About Running Out of Food in the Last Year: Never true    Ran Out of Food in the Last Year: Never true  Transportation Needs: No Transportation Needs (11/01/2022)   PRAPARE - Administrator, Civil Service (Medical): No    Lack  of Transportation (Non-Medical): No  Physical Activity: Sufficiently Active (11/01/2022)   Exercise Vital Sign    Days of Exercise per Week: 5 days    Minutes of Exercise per Session: 30 min  Stress: No Stress Concern Present (11/01/2022)   [employer] of Occupational Health - Occupational Stress Questionnaire    Feeling of Stress : Not at all  Social Connections: Moderately Integrated (11/01/2022)   Social Connection and Isolation Panel [NHANES]    Frequency of Communication with Friends and Family: Twice a week    Frequency of Social Gatherings with Friends and Family: Twice a week    Attends Religious Services: 1 to 4 times per year    Active Member of [employer] or Organizations: No    Attends Banker Meetings: Never    Marital Status: Married  Catering manager Violence: Not At Risk (11/01/2022)   Humiliation, Afraid, Rape, and Kick questionnaire    Fear of Current or Ex-Partner: No    Emotionally Abused: No    Physically Abused: No    Sexually Abused: No    Review of Systems:    Constitutional: No weight loss, fever, chills, weakness or fatigue HEENT: Eyes: No change in vision               Ears, Nose, Throat:  No change in hearing or congestion Skin: No rash or itching Cardiovascular: No chest pain, chest pressure or palpitations   Respiratory: No SOB or cough Gastrointestinal: See HPI and otherwise negative Genitourinary: No dysuria or change in urinary frequency Neurological: No headache, dizziness or syncope Musculoskeletal: No new muscle or joint pain Hematologic: No bleeding or bruising Psychiatric: No history of depression or anxiety    Physical Exam:  Vital signs: BP 122/70   Pulse (!) 41   Ht 6' (1.829 m)   Wt 103.4 kg   BMI 30.92 kg/m   Constitutional: NAD, Well developed, Well nourished, alert and cooperative Head:  Normocephalic and atraumatic. Eyes:   PEERL, EOMI. No icterus. Conjunctiva pink. Respiratory: Respirations even and  unlabored. Lungs clear to auscultation bilaterally.   No wheezes, crackles, or rhonchi.  Cardiovascular:  Regular rate and rhythm. No peripheral edema, cyanosis or pallor.  Gastrointestinal: Soft, nondistended, normoactive bowel sounds.  Tenderness to palpation around the area of previous hernia surgery.  Palpable knot in the same area.  Some LLQ tenderness. Rectal:  Not performed.  Msk:  Symmetrical without gross deformities. Without edema, no deformity or joint abnormality.  Neurologic:  Alert and  oriented x4;  grossly normal neurologically.  Skin:   Dry and intact without significant lesions or rashes. Psychiatric: Oriented to  person, place and time. Demonstrates good judgement and reason without abnormal affect or behaviors.   RELEVANT LABS AND IMAGING: CBC    Component Value Date/Time   WBC 6.5 03/27/2023 0805   RBC 4.62 03/27/2023 0805   HGB 15.0 03/27/2023 0805   HGB 15.0 07/13/2022 0943   HCT 44.4 03/27/2023 0805   HCT 43.3 07/13/2022 0943   PLT 255.0 03/27/2023 0805   PLT 269 07/13/2022 0943   MCV 96.2 03/27/2023 0805   MCV 93 07/13/2022 0943   MCH 32.1 07/13/2022 0943   MCH 32.3 11/Andre/2020 1039   MCHC 33.7 03/27/2023 0805   RDW 13.5 03/27/2023 0805   RDW 12.5 07/13/2022 0943   LYMPHSABS 2.5 03/27/2023 0805   LYMPHSABS 3.3 (H) 12/09/2021 0821   MONOABS 0.6 03/27/2023 0805   EOSABS 0.2 03/27/2023 0805   EOSABS 0.3 12/09/2021 0821   BASOSABS 0.0 03/27/2023 0805   BASOSABS 0.1 12/09/2021 0821    CMP     Component Value Date/Time   NA 139 03/27/2023 0805   NA 140 07/13/2022 0943   K 4.3 03/27/2023 0805   CL 104 03/27/2023 0805   CO2 24 03/27/2023 0805   GLUCOSE 116 (H) 03/27/2023 0805   BUN 14 03/27/2023 0805   BUN 17 07/13/2022 0943   CREATININE 0.76 03/27/2023 0805   CALCIUM 9.2 03/27/2023 0805   PROT 7.1 03/27/2023 0805   PROT 7.8 07/13/2022 0943   ALBUMIN 4.1 03/27/2023 0805   ALBUMIN 4.9 07/13/2022 0943   AST Andre 03/27/2023 0805   ALT 24 03/27/2023  0805   ALKPHOS 65 03/27/2023 0805   BILITOT 0.8 03/27/2023 0805   BILITOT 0.6 07/13/2022 0943   GFRNONAA 94 01/08/2021 0801   GFRAA 109 01/08/2021 0801     Assessment/Plan:      Abdominal Pain Chronic, constant, dull pain in the lower abdomen, below the site of a previous hernia repair. Pain is not associated with eating, bowel movements, or exertion. No red flag symptoms such as weight loss or bleeding. -Order CT scan of the abdomen to evaluate the source of pain, with a focus on the previous hernia repair site. - With previous surgeon and PCP recommending repeat surgery, suspect CT will not show much and will need to be referred back to surgeons for repeat hernia repair - Reassuringly he has had EGD/colonoscopy earlier this year  Hernia Previous hernia repair in the umbilical region, with a possible recurrence suggested by the patient and previous physicians. -If CT scan confirms hernia recurrence, refer to surgery for possible repair.  History of tubular adenomas Due for repeat colonoscopy 11/2025  General Health Maintenance Up to date on colonoscopy and endoscopy. Labs from May 2024 were normal. -No further action needed at this time.       This visit required 38 minutes of patient care (this includes precharting, chart review, review of results, face-to-face time used for counseling as well as treatment plan and follow-up. The patient was provided an opportunity to ask questions and all were answered. The patient agreed with the plan and demonstrated an understanding of the instructions.   Lara Mulch [facility] 09/14/2023, 10:44 AM  Cc: Tresa Garter, MD

## 2023-09-14 NOTE — Patient Instructions (Signed)
You will be contacted by [facility] Scheduling in the next 2 days to arrange a CT Abdomen/Pelvis.  The number on your caller ID will be [phone], please answer when they call.  If you have not heard from them in 2 days please call [phone] to schedule.    Due to recent changes in healthcare laws, you may see the results of your imaging and laboratory studies on MyChart before your provider has had a chance to review them.  We understand that in some cases there may be results that are confusing or concerning to you. Not all laboratory results come back in the same time frame and the provider may be waiting for multiple results in order to interpret others.  Please give [place] 48 hours in order for your provider to thoroughly review all the results before contacting the office for clarification of your results.   _______________________________________________________  If your blood pressure at your visit was 140/90 or greater, please contact your primary care physician to follow up on this.  _______________________________________________________  If you are age 70 or older, your body mass index should be between 23-30. Your Body mass index is 30.92 kg/m. If this is out of the aforementioned range listed, please consider follow up with your Primary Care Provider.  If you are age 65 or younger, your body mass index should be between 19-25. Your Body mass index is 30.92 kg/m. If this is out of the aformentioned range listed, please consider follow up with your Primary Care Provider.   ________________________________________________________  The [facility] providers would like to encourage you to use [facility] to communicate with providers for non-urgent requests or questions.  Due to long hold times on the telephone, sending your provider a message by [facility] may be a faster and more efficient way to get a response.  Please allow 48 business hours for a response.  Please remember that  this is for non-urgent requests.  _______________________________________________________    I appreciate the  opportunity to care for you  Thank You   [facility]

## 2023-09-14 NOTE — Progress Notes (Signed)
Reviewed. Agree with CT. If CT unrevealing (other than hernia), he should return to his general surgeon.

## 2023-09-15 ENCOUNTER — Other Ambulatory Visit: Payer: Self-pay | Admitting: Internal Medicine

## 2023-09-20 ENCOUNTER — Ambulatory Visit (HOSPITAL_COMMUNITY)
Admission: RE | Admit: 2023-09-20 | Discharge: 2023-09-20 | Disposition: A | Discharge status: Home / Self Care | Payer: Medicare HMO | Source: Ambulatory Visit | Attending: Gastroenterology | Admitting: Gastroenterology

## 2023-09-20 DIAGNOSIS — Z8719 Personal history of other diseases of the digestive system: Secondary | ICD-10-CM | POA: Diagnosis not present

## 2023-09-20 DIAGNOSIS — Z9889 Other specified postprocedural states: Secondary | ICD-10-CM | POA: Diagnosis not present

## 2023-09-20 DIAGNOSIS — K5730 Diverticulosis of large intestine without perforation or abscess without bleeding: Secondary | ICD-10-CM | POA: Diagnosis not present

## 2023-09-20 DIAGNOSIS — R1030 Lower abdominal pain, unspecified: Secondary | ICD-10-CM | POA: Diagnosis not present

## 2023-09-20 DIAGNOSIS — N281 Cyst of kidney, acquired: Secondary | ICD-10-CM | POA: Diagnosis not present

## 2023-09-20 MED ORDER — IOHEXOL 300 MG/ML  SOLN
100.0000 mL | Freq: Once | INTRAMUSCULAR | Status: AC | PRN
  Administered 2023-09-20: 100 mL via INTRAVENOUS

## 2023-09-26 ENCOUNTER — Telehealth: Payer: Self-pay | Admitting: Gastroenterology

## 2023-09-26 NOTE — Telephone Encounter (Signed)
Inbound call from patient stating that he had a CT scan done on 11/6 and has not heard the results from the scan. Patient stated that he is in a good amount of pain and is wanting to discuss with nurse. Please advise.

## 2023-09-26 NOTE — Telephone Encounter (Signed)
Advised patient that we have not yet received results from CT; we have been seeing around 10 day turn around time for radiology readings. Advised as soon as we get result, we will review and make recommendations as needed. Patient states he was just concerned that he had not yet heard anything and didn't know whether he needed to cancel upcoming plans.

## 2023-10-16 ENCOUNTER — Other Ambulatory Visit: Payer: Self-pay | Admitting: Internal Medicine

## 2023-10-17 NOTE — Telephone Encounter (Signed)
I have spoken to patient regarding CT scan results. Clarified that he should take IB Gard OTC to see if this helps pain. He verbalizes understanding.

## 2023-10-17 NOTE — Telephone Encounter (Signed)
Inbound call from patient, wanting to follow up on CT results, he states he read the message but has additional questions. Please advise.

## 2023-10-28 ENCOUNTER — Other Ambulatory Visit: Payer: Self-pay | Admitting: Internal Medicine

## 2023-11-09 ENCOUNTER — Other Ambulatory Visit: Payer: Self-pay | Admitting: Internal Medicine

## 2023-11-17 ENCOUNTER — Other Ambulatory Visit: Payer: Self-pay | Admitting: Internal Medicine

## 2023-12-15 ENCOUNTER — Other Ambulatory Visit: Payer: Self-pay | Admitting: Internal Medicine

## 2023-12-29 ENCOUNTER — Other Ambulatory Visit: Payer: Self-pay | Admitting: Internal Medicine

## 2024-01-09 DIAGNOSIS — D225 Melanocytic nevi of trunk: Secondary | ICD-10-CM | POA: Diagnosis not present

## 2024-01-09 DIAGNOSIS — D2262 Melanocytic nevi of left upper limb, including shoulder: Secondary | ICD-10-CM | POA: Diagnosis not present

## 2024-01-09 DIAGNOSIS — L821 Other seborrheic keratosis: Secondary | ICD-10-CM | POA: Diagnosis not present

## 2024-01-09 DIAGNOSIS — L570 Actinic keratosis: Secondary | ICD-10-CM | POA: Diagnosis not present

## 2024-01-09 DIAGNOSIS — Z85828 Personal history of other malignant neoplasm of skin: Secondary | ICD-10-CM | POA: Diagnosis not present

## 2024-01-09 DIAGNOSIS — L814 Other melanin hyperpigmentation: Secondary | ICD-10-CM | POA: Diagnosis not present

## 2024-01-10 ENCOUNTER — Ambulatory Visit (INDEPENDENT_AMBULATORY_CARE_PROVIDER_SITE_OTHER): Payer: Medicare HMO | Admitting: Internal Medicine

## 2024-01-10 ENCOUNTER — Encounter: Payer: Self-pay | Admitting: Internal Medicine

## 2024-01-10 VITALS — BP 122/80 | HR 74 | Temp 98.6°F | Ht 72.0 in | Wt 232.0 lb

## 2024-01-10 DIAGNOSIS — R1030 Lower abdominal pain, unspecified: Secondary | ICD-10-CM

## 2024-01-10 DIAGNOSIS — K429 Umbilical hernia without obstruction or gangrene: Secondary | ICD-10-CM | POA: Diagnosis not present

## 2024-01-10 DIAGNOSIS — E66811 Obesity, class 1: Secondary | ICD-10-CM

## 2024-01-10 DIAGNOSIS — M5440 Lumbago with sciatica, unspecified side: Secondary | ICD-10-CM | POA: Diagnosis not present

## 2024-01-10 DIAGNOSIS — Z6830 Body mass index (BMI) 30.0-30.9, adult: Secondary | ICD-10-CM

## 2024-01-10 DIAGNOSIS — M25562 Pain in left knee: Secondary | ICD-10-CM | POA: Diagnosis not present

## 2024-01-10 DIAGNOSIS — R109 Unspecified abdominal pain: Secondary | ICD-10-CM

## 2024-01-10 DIAGNOSIS — Z23 Encounter for immunization: Secondary | ICD-10-CM | POA: Diagnosis not present

## 2024-01-10 DIAGNOSIS — M25561 Pain in right knee: Secondary | ICD-10-CM

## 2024-01-10 DIAGNOSIS — K2100 Gastro-esophageal reflux disease with esophagitis, without bleeding: Secondary | ICD-10-CM | POA: Diagnosis not present

## 2024-01-10 DIAGNOSIS — E785 Hyperlipidemia, unspecified: Secondary | ICD-10-CM

## 2024-01-10 DIAGNOSIS — I25119 Atherosclerotic heart disease of native coronary artery with unspecified angina pectoris: Secondary | ICD-10-CM | POA: Diagnosis not present

## 2024-01-10 HISTORY — DX: Unspecified abdominal pain: R10.9

## 2024-01-10 NOTE — Assessment & Plan Note (Signed)
 Using castor oil, inversion table, cayenne pepper Off Tramadol x 1 year On Meloxicam, Castor oil rub  Potential benefits of a long term opioids use as well as potential risks (i.e. addiction risk, apnea etc) and complications (i.e. Somnolence, constipation and others) were explained to the patient and were aknowledged.

## 2024-01-10 NOTE — Assessment & Plan Note (Signed)
Cont Protonix.

## 2024-01-10 NOTE — Assessment & Plan Note (Signed)
 Wt Readings from Last 3 Encounters:  01/10/24 232 lb (105.2 kg)  09/14/23 228 lb (103.4 kg)  03/14/23 230 lb 12.8 oz (104.7 kg)

## 2024-01-10 NOTE — Assessment & Plan Note (Signed)
 Ms Bontempo, [name] and Dr Marina Goodell (GI)ordered CT scan of the abdomen to evaluate the source of abd  pain, with a focus on the previous hernia repair site.  ?IBS - given IB Guard

## 2024-01-10 NOTE — Progress Notes (Signed)
 Subjective:  Patient ID: [name], male    DOB: 1951-04-30  Age: 73 y.o. MRN: [id number]  CC: Medical Management of Chronic Issues (6 MNTH F/U)   HPI [name] presents for OA, CAD, HTN, anxiety  GI ordered CT scan of the abdomen to evaluate the source of abd  pain, with a focus on the previous hernia repair site.    Outpatient Medications Prior to Visit  Medication Sig Dispense Refill   acetaminophen (TYLENOL) 500 MG tablet Take 500 mg by mouth every 6 (six) hours as needed for headache.     ALPRAZolam (XANAX) 0.5 MG tablet Take 1 tablet by mouth twice daily as needed for anxiety 60 tablet 1   aspirin 81 MG tablet Take 81 mg by mouth at bedtime.     Cyanocobalamin (VITAMIN B-12) 2500 MCG SUBL Place 5,000 mcg under the tongue daily.     escitalopram (LEXAPRO) 10 MG tablet Take 1 tablet by mouth once daily 90 tablet 1   Evolocumab (REPATHA SURECLICK) 140 MG/ML SOAJ INJECT 1 ML INTO THE SKIN EVERY 14 DAYS 2 mL 11   fluticasone (FLONASE) 50 MCG/ACT nasal spray Use 1 spray(s) in each nostril once daily 16 g 5   loratadine (CLARITIN) 10 MG tablet TAKE 1 TABLET BY MOUTH ONCE DAILY AS NEEDED FOR ALLERGIES 90 tablet 0   meloxicam (MOBIC) 7.5 MG tablet Take 1-2 tablets (7.5-15 mg total) by mouth daily. 180 tablet 0   Multiple Vitamin (MULTIVITAMIN WITH MINERALS) TABS tablet Take 1 tablet by mouth daily.     nitroGLYCERIN (NITROSTAT) 0.4 MG SL tablet Place 1 tablet (0.4 mg total) under the tongue every 5 (five) minutes as needed for chest pain. 25 tablet 9   pantoprazole (PROTONIX) 40 MG tablet Take 1 tablet by mouth twice daily 180 tablet 0   valACYclovir (VALTREX) 500 MG tablet Take 1 tablet by mouth once daily 90 tablet 0   budesonide-formoterol (SYMBICORT) 80-4.5 MCG/ACT inhaler Inhale 2 puffs into the lungs every 12 (twelve) hours. (Patient not taking: Reported on 01/10/2024) 10.2 g 6   No facility-administered medications prior to visit.    ROS: Review of Systems   Constitutional:  Negative for appetite change, fatigue and unexpected weight change.  HENT:  Negative for congestion, nosebleeds, sneezing, sore throat and trouble swallowing.   Eyes:  Negative for itching and visual disturbance.  Respiratory:  Negative for cough.   Cardiovascular:  Negative for chest pain, palpitations and leg swelling.  Gastrointestinal:  Positive for abdominal pain. Negative for abdominal distention, blood in stool, diarrhea, nausea and vomiting.  Genitourinary:  Negative for frequency and hematuria.  Musculoskeletal:  Positive for arthralgias and gait problem. Negative for back pain, joint swelling and neck pain.  Skin:  Negative for rash.  Neurological:  Negative for dizziness, tremors, speech difficulty and weakness.  Psychiatric/Behavioral:  Negative for agitation, dysphoric mood, sleep disturbance and suicidal ideas. The patient is not nervous/anxious.     Objective:  BP 122/80   Pulse 74   Temp 98.6 F (37 C) (Oral)   Ht 6' (1.829 m)   Wt 232 lb (105.2 kg)   SpO2 94%   BMI 31.46 kg/m   BP Readings from Last 3 Encounters:  01/10/24 122/80  09/14/23 122/70  07/10/23 138/86    Wt Readings from Last 3 Encounters:  01/10/24 232 lb (105.2 kg)  09/14/23 228 lb (103.4 kg)  03/14/23 230 lb 12.8 oz (104.7 kg)    Physical Exam Constitutional:  General: He is not in acute distress.    Appearance: He is well-developed. He is obese.     Comments: NAD  Eyes:     Conjunctiva/sclera: Conjunctivae normal.     Pupils: Pupils are equal, round, and reactive to light.  Neck:     Thyroid: No thyromegaly.     Vascular: No JVD.  Cardiovascular:     Rate and Rhythm: Normal rate and regular rhythm.     Heart sounds: Normal heart sounds. No murmur heard.    No friction rub. No gallop.  Pulmonary:     Effort: Pulmonary effort is normal. No respiratory distress.     Breath sounds: Normal breath sounds. No wheezing, rhonchi or rales.  Chest:     Chest wall: No  tenderness.  Abdominal:     General: Bowel sounds are normal. There is no distension.     Palpations: Abdomen is soft. There is no mass.     Tenderness: There is no abdominal tenderness. There is no guarding or rebound.  Musculoskeletal:        General: No tenderness. Normal range of motion.     Cervical back: Normal range of motion.  Lymphadenopathy:     Cervical: No cervical adenopathy.  Skin:    General: Skin is warm and dry.     Findings: No rash.  Neurological:     Mental Status: He is alert and oriented to person, place, and time.     Cranial Nerves: No cranial nerve deficit.     Motor: No abnormal muscle tone.     Coordination: Coordination normal.     Gait: Gait normal.     Deep Tendon Reflexes: Reflexes are normal and symmetric.  Psychiatric:        Behavior: Behavior normal.        Thought Content: Thought content normal.        Judgment: Judgment normal.   Umbilical hernia  Lab Results  Component Value Date   WBC 6.5 03/27/2023   HGB 15.0 03/27/2023   HCT 44.4 03/27/2023   PLT 255.0 03/27/2023   GLUCOSE 116 (H) 03/27/2023   CHOL 122 03/27/2023   TRIG 113.0 03/27/2023   HDL 42.30 03/27/2023   LDLDIRECT 110 (H) 10/07/2019   LDLCALC 57 03/27/2023   ALT 24 03/27/2023   AST 20 03/27/2023   NA 139 03/27/2023   K 4.3 03/27/2023   CL 104 03/27/2023   CREATININE 0.76 03/27/2023   BUN 14 03/27/2023   CO2 24 03/27/2023   TSH 3.03 03/27/2023   PSA 0.83 03/27/2023   INR 1.0 08/29/2017   HGBA1C 5.8 03/27/2023    CT ABDOMEN PELVIS W CONTRAST Result Date: 10/09/2023 CLINICAL DATA:  Lower abdominal pain EXAM: CT ABDOMEN AND PELVIS WITH CONTRAST TECHNIQUE: Multidetector CT imaging of the abdomen and pelvis was performed using the standard protocol following bolus administration of intravenous contrast. RADIATION DOSE REDUCTION: This exam was performed according to the departmental dose-optimization program which includes automated exposure control, adjustment of the mA  and/or kV according to patient size and/or use of iterative reconstruction technique. CONTRAST:  OMNIPAQUE IOHEXOL 300 MG/ML  SOLN COMPARISON:  09/25/2008. FINDINGS: Lower chest: Minimal dependent basilar subsegmental atelectasis. No pericardial or pleural effusion. Hepatobiliary: No focal liver abnormality is seen. No gallstones, gallbladder wall thickening, or biliary dilatation. Pancreas: Unremarkable. No pancreatic ductal dilatation or surrounding inflammatory changes. Spleen: Normal in size without focal abnormality. Adrenals/Urinary Tract: Adrenal glands are unremarkable. Right kidney parapelvic 3.5 cm cyst.  No follow up recommended for this finding. Kidneys are otherwise normal, without renal calculi, focal lesion, or hydronephrosis. Bladder is unremarkable. Stomach/Bowel: Stomach is within normal limits. Appendix appears normal. No evidence of bowel wall thickening, distention, or inflammatory changes. A few sigmoid diverticula noted. Vascular/Lymphatic: Aortic atherosclerosis. No enlarged abdominal or pelvic lymph nodes. Reproductive: Prostate is unremarkable. Other: No abdominal wall hernia or abnormality. No abdominopelvic ascites. Musculoskeletal: Thoracolumbosacral degenerative changes. IMPRESSION: 1. No acute abdominal or pelvic pathology identified. 2. Right kidney parapelvic cyst. 3. Aortic atherosclerosis (ICD10-I70.0). Electronically Signed   By: Layla Maw M.D.   On: 10/09/2023 13:31    Assessment & Plan:   Problem List Items Addressed This Visit     GERD   Cont Protonix      KNEE PAIN   Using castor oil, inversion table, cayenne pepper Off Tramadol x 1 year On Meloxicam, Castor oil rub  Potential benefits of a long term opioids use as well as potential risks (i.e. addiction risk, apnea etc) and complications (i.e. Somnolence, constipation and others) were explained to the patient and were aknowledged.      CAD (coronary artery disease) - Primary   Cont on Repatha  injections, ASA      Dyslipidemia   Cont on Zetia      Low back pain   Using castor oil, inversion table, cayenne pepper Off Tramadol x 1 year On Meloxicam, Castor oil rub  Potential benefits of a long term opioids use as well as potential risks (i.e. addiction risk, apnea etc) and complications (i.e. Somnolence, constipation and others) were explained to the patient and were aknowledged.      Obesity (BMI 30.0-34.9)   Wt Readings from Last 3 Encounters:  01/10/24 232 lb (105.2 kg)  09/14/23 228 lb (103.4 kg)  03/14/23 230 lb 12.8 oz (104.7 kg)         Umbilical hernia   Ms McRae, [name] and Dr Marina Goodell (GI)ordered CT scan of the abdomen to evaluate the source of abd  pain, with a focus on the previous hernia repair site.  ?IBS - given IB Guard      Abdominal pain   Ms Ermis, [name] and Dr Marina Goodell (GI)ordered CT scan of the abdomen to evaluate the source of abd  pain, with a focus on the previous hernia repair site.  ?IBS - given IB Guard         No orders of the defined types were placed in this encounter.     Follow-up: Return in about 3 months (around 04/08/2024) for a follow-up visit.  Sonda Primes, MD

## 2024-01-10 NOTE — Assessment & Plan Note (Signed)
Cont on Repatha injections, ASA 

## 2024-01-10 NOTE — Assessment & Plan Note (Signed)
 Cont on Zetia

## 2024-01-12 NOTE — Addendum Note (Signed)
 Addended by: Delsa Grana R on: 01/12/2024 03:57 PM   Modules accepted: Orders

## 2024-02-12 ENCOUNTER — Other Ambulatory Visit: Payer: Self-pay | Admitting: Internal Medicine

## 2024-02-12 ENCOUNTER — Ambulatory Visit: Payer: Medicare HMO

## 2024-02-12 VITALS — Ht 72.0 in | Wt 232.0 lb

## 2024-02-12 DIAGNOSIS — Z0000 Encounter for general adult medical examination without abnormal findings: Secondary | ICD-10-CM | POA: Diagnosis not present

## 2024-02-12 NOTE — Patient Instructions (Signed)
 [name] , Thank you for taking time to come for your Medicare Wellness Visit. I appreciate your ongoing commitment to your health goals. Please review the following plan we discussed and let me know if I can assist you in the future.   Referrals/Orders/Follow-Ups/Clinician Recommendations: It was nice talking with you today.  Keep up the good work.  This is a list of the screening recommended for you and due dates:  Health Maintenance  Topic Date Due   Medicare Annual Wellness Visit  11/02/2023   Colon Cancer Screening  11/30/2025   DTaP/Tdap/Td vaccine (3 - Td or Tdap) 08/11/2030   Pneumonia Vaccine  Completed   Flu Shot  Completed   Hepatitis C Screening  Completed   HPV Vaccine  Aged Out   COVID-19 Vaccine  Discontinued   Zoster (Shingles) Vaccine  Discontinued    Advanced directives: (Declined) Advance directive discussed with you today. Even though you declined this today, please call our office should you change your mind, and we can give you the proper paperwork for you to fill out.  Next Medicare Annual Wellness Visit scheduled for next year: Yes

## 2024-02-12 NOTE — Progress Notes (Cosign Needed Addendum)
 Subjective:   [name] is a 73 y.o. who presents for a Medicare Wellness preventive visit.  Visit Complete: Virtual I connected with  Daryl Eastern on 02/14/24 by a audio enabled telemedicine application and verified that I am speaking with the correct person using two identifiers.  Patient Location: Home  Provider Location: Home Office  I discussed the limitations of evaluation and management by telemedicine. The patient expressed understanding and agreed to proceed.  Vital Signs: Because this visit was a virtual/telehealth visit, some criteria may be missing or patient reported. Any vitals not documented were not able to be obtained and vitals that have been documented are patient reported.  VideoDeclined- This patient declined Librarian, academic. Therefore the visit was completed with audio only.  Persons Participating in Visit: Patient.  AWV Questionnaire: No: Patient Medicare AWV questionnaire was not completed prior to this visit.  Cardiac Risk Factors include: hypertension;male gender;advanced age (>26men, >28 women);Other (see comment), Risk factor comments: CAD, OSA, Aortic atherosclerosis     Objective:    Today's Vitals   02/12/24 0804  Weight: 232 lb (105.2 kg)  Height: 6' (1.829 m)   Body mass index is 31.46 kg/m.     02/12/2024    8:27 AM 11/01/2022    8:52 AM 10/29/2021   11:26 AM 08/25/2020   10:14 AM 10/04/2019    9:45 AM 04/15/2019    4:18 PM 12/25/2017   11:30 AM  Advanced Directives  Does Patient Have a Medical Advance Directive? No No No No No No No  Would patient like information on creating a medical advance directive?  No - Patient declined No - Patient declined No - Patient declined Yes (ED - Information included in AVS) Yes (ED - Information included in AVS) Yes (ED - Information included in AVS)    Current Medications (verified) Outpatient Encounter Medications as of 02/12/2024  Medication Sig    acetaminophen (TYLENOL) 500 MG tablet Take 500 mg by mouth every 6 (six) hours as needed for headache.   ALPRAZolam (XANAX) 0.5 MG tablet Take 1 tablet by mouth twice daily as needed for anxiety   aspirin 81 MG tablet Take 81 mg by mouth at bedtime.   budesonide-formoterol (SYMBICORT) 80-4.5 MCG/ACT inhaler Inhale 2 puffs into the lungs every 12 (twelve) hours.   Cyanocobalamin (VITAMIN B-12) 2500 MCG SUBL Place 5,000 mcg under the tongue daily.   escitalopram (LEXAPRO) 10 MG tablet Take 1 tablet by mouth once daily   Evolocumab (REPATHA SURECLICK) 140 MG/ML SOAJ INJECT 1 ML INTO THE SKIN EVERY 14 DAYS   fluticasone (FLONASE) 50 MCG/ACT nasal spray Use 1 spray(s) in each nostril once daily   loratadine (CLARITIN) 10 MG tablet TAKE 1 TABLET BY MOUTH ONCE DAILY AS NEEDED FOR ALLERGIES   meloxicam (MOBIC) 7.5 MG tablet Take 1-2 tablets (7.5-15 mg total) by mouth daily.   Multiple Vitamin (MULTIVITAMIN WITH MINERALS) TABS tablet Take 1 tablet by mouth daily.   nitroGLYCERIN (NITROSTAT) 0.4 MG SL tablet Place 1 tablet (0.4 mg total) under the tongue every 5 (five) minutes as needed for chest pain.   pantoprazole (PROTONIX) 40 MG tablet Take 1 tablet by mouth twice daily   valACYclovir (VALTREX) 500 MG tablet Take 1 tablet by mouth once daily   No facility-administered encounter medications on file as of 02/12/2024.    Allergies (verified) Hytrin [terazosin], Icosapent ethyl (epa ethyl ester) (fish), Levaquin [levofloxacin in d5w], Lipitor [atorvastatin], Nexletol [bempedoic acid], Penicillins, Reglan [metoclopramide], and  Xanax [alprazolam]   History: Past Medical History:  Diagnosis Date   Actinic keratosis    Acute sinusitis 06/21/2010   9/14, 10/19    Allergy    Anxiety 08/24/2017   Chronic  Worse in 10/18 Xanax prn - d/c  Potential benefits of a long term benzodiazepines  use as well as potential risks  and complications were explained to the patient and were aknowledged. 11/18 Lexapro      Arthralgia 04/14/2017   2018 2019 Rheum consult; Gluten free trial Dr Corliss Skains 12[facility] Depo-medrol IM Increase Tramadol w/caution  Potential benefits of a long term opioids use as well as potential risks (i.e. addiction risk, apnea etc) and complications (i.e. Somnolence, constipation and others) were explained to the patient and were aknowledged.   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 Pravastatin - pt stopped due to side effects 1/21 Zetia - causing pain too Krill oil d/c.  Repatha option discussed.  9/21 Vascepa, Zetia   Chest pain 12/07/2020   Chondrocalcinosis 07/06/2018   Chronic sinusitis 08/12/2013    Sinus congestion, SOB - better after surgery - nov 2014 9/14 worse CT IMPRESSION:  Minimal sinus mucosal thickening, primarily in the frontal recesses.  Small right maxillary mucous retention cyst. No CT evidence of acute  sinusitis.  Electronically Signed  By: Augusto Gamble M.D.  On: 08/15/2013 09:19  Doxy x 4 wks   Colonic polyp    COLONIC POLYPS, HX OF    CONTACT DERMATITIS DUE TO SOLVENTS    DDD (degenerative disc disease), cervical 07/06/2018   DDD (degenerative disc disease), lumbar 07/06/2018   and facet joint arthropathy   Dizzinesses 07/18/2017   9/18 I suggested a brain MRI to r/o acustic neuroma etc: he will think about it and check on cost ENT ref suggested   DOE (dyspnea on exertion) 07/25/2012   related to eating shrimp   Dyslipidemia 06/21/2013   Chronic  Elev  TG, low HDL, nl LDL Pravastatin - pt stopped due to side effects 1/21 Repatha option discussed. On Zetia - c/o side effeects 9/21 Vascepa, Zetia   Dyspnea 07/25/2012   related to eating shrimp   Essential hypertension 07/29/2021   Fatigue 06/09/2015   2016 no OSA  2018 much better (60%) off Reglan.    Folliculitis 11/19/2018   GERD 05/09/2008   Chronic  -- Protonix 5/18 We discussed his GERD/Barrett's treated at [facility] (Dr Noe Gens). Reglan was d/c'd due to CP. He was dx'd w/pancreatitis and started on Zenpep He had an abd CT, [place], EGD and colonoscopy. He would like to have a 2nd opinion here - will refer   GERD (gastroesophageal reflux disease)    Hand eczema 07/07/2015   8/16 dyshidrotic eczema B    HAND PAIN    Heart murmur    HERPES, GENITAL NOS    Hyperglycemia 06/09/2015   Mild     Hypogonadism in male 04/25/2018   Declined  testosterone Rx   Impacted cerumen of left ear 12/24/2020   Ingrowing toenail of left foot 07/25/2012   is resolved now 07-25-12   Insomnia 04/18/2022   6/23 Worse due to pain. Try Flexeril at hs.   KNEE PAIN    Laryngopharyngeal reflux (LPR) 10/02/2018   Localized swelling, mass or lump of neck 09/05/2018   Low back pain 06/09/2015   Per Dr Charlann Boxer 3/17 - planning to see Dr Jordan Likes Body pains are much better (60%) off Reglan. Off Tramadol 2019 Rheum consult; Gluten free trial Tramadol  Potential benefits of a long term opioids use as well as potential risks (i.e. addiction risk, apnea etc) and complications (i.e. Somnolence, constipation and others) were explained to the patient and were aknowledged.   Nausea 07/18/2017   Chronic w/dizziness I suggested a brain MRI to r/o acustic neuroma etc: he will think about it and check on cost 9/18   Neoplasm of uncertain behavior of skin    OA (osteoarthritis) of knee    Obesity (BMI 30.0-34.9) 01/07/2021   ONYCHOMYCOSIS    OSA (obstructive sleep apnea) 08/21/2013   2014 no need for CPAP    Preop exam for internal  medicine    Primary osteoarthritis of both hands 11/21/2008   Chronic and severe Tramadol  Potential benefits of a long term opioids use as well as potential risks (i.e. addiction risk, apnea etc) and complications (i.e. Somnolence, constipation and others) were explained to the patient and were aknowledged.   10/18 Body pains are much better (60%) off Reglan. 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 Xanax prn - d/c  Potential benefits of a long term benzodiazepines  use as well as potential risks  and complications were explained to the patient and were aknowledged. Neurol ref - Dr Tat:   "Hi   Umbilical hernia 05/24/2022   Relapsed Surgical ref   Upper respiratory infection 07/27/2018   9/19, 12/21   Well adult exam    Past Surgical History:  Procedure Laterality Date   COLONOSCOPY  04/24/2019   JOINT REPLACEMENT  9/13   L TKR   KNEE ARTHROSCOPY     Left   LEFT HEART CATH AND CORONARY ANGIOGRAPHY N/A 08/31/2017   Procedure: LEFT HEART CATH AND CORONARY ANGIOGRAPHY;  Surgeon: Yvonne Kendall, MD;  Location: [facility];  Service: Cardiovascular;  Laterality: N/A;   TOTAL KNEE ARTHROPLASTY  07/31/2012   Procedure: TOTAL KNEE ARTHROPLASTY;  Surgeon: Eugenia Mcalpine, MD;  Location: [facility];  Service: Orthopedics;  Laterality: Left;   UMBILICAL HERNIA REPAIR  07-25-12   10 yrs ago   UPPER GASTROINTESTINAL ENDOSCOPY  04/24/2019    Family History  Problem Relation Age of Onset   Diabetes Mother 37   Heart disease Father 52       CAD, MI, CHF   Lung cancer Brother  smoker   Coronary artery disease Other    Colon polyps Brother    Heart disease Brother 70       CHF   Colon cancer Brother    Healthy Daughter    Stomach cancer Neg Hx    Rectal cancer Neg Hx    Esophageal cancer Neg Hx    Social History   Socioeconomic History   Marital status: Married    Spouse name: Johnny Bridge   Number of children: 2   Years of education: Not on file   Highest education level: Not on file  Occupational History   Occupation: retired  Tobacco Use   Smoking status: Former    Types: Cigars    Quit date: 08/30/2007    Years since quitting: 16.4    Passive exposure: Past   Smokeless tobacco: Never   Tobacco comments:    Marijuana use some days.  Updated 03/14/2023 amy marsh, cma  Vaping Use   Vaping status: Never Used  Substance and Sexual Activity   Alcohol use: Yes    Comment: occ.    Drug use: Yes    Types: Marijuana    Comment: 2-3 days ago   Sexual activity: Yes    Partners: Female    Birth control/protection: None  Other Topics Concern   Not on file  Social History Narrative   Lives at home with wife and 2 dogs   Social Drivers of Health   Financial Resource Strain: High Risk (02/12/2024)   Overall Financial Resource Strain (CARDIA)    Difficulty of Paying Living Expenses: Hard  Food Insecurity: No Food Insecurity (02/12/2024)   Hunger Vital Sign    Worried About Running Out of Food in the Last Year: Never true    Ran Out of Food in the Last Year: Never true  Transportation Needs: No Transportation Needs (02/12/2024)   PRAPARE - Administrator, Civil Service (Medical): No    Lack of Transportation (Non-Medical): No  Physical Activity: Sufficiently Active (02/12/2024)   Exercise Vital Sign    Days of Exercise per Week: 5 days    Minutes of Exercise per Session: 60 min  Stress: No  Stress Concern Present (02/12/2024)   [employer] of Occupational Health - Occupational Stress Questionnaire    Feeling of Stress : Not at all  Social Connections: Moderately Isolated (02/12/2024)   Social Connection and Isolation Panel [NHANES]    Frequency of Communication with Friends and Family: Three times a week    Frequency of Social Gatherings with Friends and Family: Once a week    Attends Religious Services: Never    Database administrator or Organizations: No    Attends Banker Meetings: Never    Marital Status: Married    Tobacco Counseling Counseling given: Not Answered Tobacco comments: Marijuana use some days.  Updated 03/14/2023 amy marsh, cma    Clinical Intake:  Pre-visit preparation completed: Yes  Pain : No/denies pain     BMI - recorded: 31.46 Nutritional Status: BMI > 30  Obese Nutritional Risks: None Diabetes: No  Lab Results  Component Value Date   HGBA1C 5.8 03/27/2023   HGBA1C 6.0 (H) 07/13/2022   HGBA1C 5.7 (H) 01/08/2021     How often do you need to have someone help you when you read instructions, pamphlets, or other written materials from your doctor or pharmacy?: 1 - Never  Interpreter Needed?: No  Information entered by :: Bendell Norah Devin, RMA   Activities  of Daily Living     02/12/2024    8:04 AM  In your present state of health, do you have any difficulty performing the following activities:  Hearing? 0  Vision? 0  Difficulty concentrating or making decisions? 0  Walking or climbing stairs? 1  Comment knee replacement  Dressing or bathing? 0  Doing errands, shopping? 0  Preparing Food and eating ? N  Using the Toilet? N  In the past six months, have you accidently leaked urine? N  Do you have problems with loss of bowel control? N  Managing your Medications? N  Managing your Finances? N  Housekeeping or managing your Housekeeping? N    Patient Care Team: Plotnikov, Georgina Quint, MD as PCP -  General Revankar, Aundra Dubin, MD as PCP - Cardiology (Cardiology) Wendall Stade, MD (Cardiology) Eugenia Mcalpine, MD (Inactive) (Orthopedic Surgery) Hilarie Fredrickson, MD as Consulting Physician (Gastroenterology) Revankar, Aundra Dubin, MD as Consulting Physician (Cardiology) Tat, Octaviano Batty, DO as Consulting Physician (Neurology) Jearl Klinefelter (Gastroenterology) [facility] (Ophthalmology)  Indicate any recent Medical Services you may have received from other than [facility] providers in the past year (date may be approximate).     Assessment:   This is a routine wellness examination for Mancel.  Hearing/Vision screen Hearing Screening - Comments:: Ringing in ears Vision Screening - Comments:: Wears eyeglasses   Goals Addressed               This Visit's Progress     Patient Stated (pt-stated)        To stay healthy and active.       Depression Screen     02/12/2024    8:33 AM 01/10/2024    8:01 AM 02/28/2023    7:46 AM 12/13/2022    2:56 PM 11/01/2022    8:53 AM 10/26/2022    8:06 AM 07/26/2022    8:03 AM  PHQ 2/9 Scores  PHQ - 2 Score 0 0 0 0 0 0 2  PHQ- 9 Score 3  5 0 5 5 6     Fall Risk     02/12/2024    8:28 AM 01/10/2024    8:01 AM 02/28/2023    7:46 AM 12/13/2022    2:56 PM 11/01/2022    8:53 AM  Fall Risk   Falls in the past year? 1 0 0 0 0  Number falls in past yr: 0 0 0 0 0  Injury with Fall? 0 0 0 0 0  Risk for fall due to : No Fall Risks No Fall Risks No Fall Risks No Fall Risks No Fall Risks  Follow up Falls prevention discussed;Falls evaluation completed Falls evaluation completed Falls evaluation completed Falls evaluation completed Falls prevention discussed    MEDICARE RISK AT HOME:  Medicare Risk at Home Any stairs in or around the home?: Yes If so, are there any without handrails?: Yes Home free of loose throw rugs in walkways, pet beds, electrical cords, etc?: Yes Adequate lighting in your home to reduce risk of falls?: Yes Life  alert?: No Use of a cane, walker or w/c?: Yes (cane) Grab bars in the bathroom?: No Shower chair or bench in shower?: No Elevated toilet seat or a handicapped toilet?: No  TIMED UP AND GO:  Was the test performed?  No  Cognitive Function: Normal: Normal cognitive status assessed by direct observation by this Clinical Health Advisor. No abnormalities found. Patient is able to answer questions in an accurate  and timely manner.        11/01/2022    8:53 AM  6CIT Screen  What Year? 0 points  What month? 0 points  What time? 0 points  Count back from 20 0 points  Months in reverse 0 points  Repeat phrase 0 points  Total Score 0 points    Immunizations Immunization History  Administered Date(s) Administered   Fluad Quad(high Dose 65+) 07/25/2019, 08/11/2020, 08/13/2021, 07/26/2022   Fluad Trivalent(High Dose 65+) 01/10/2024   Influenza Split 07/20/2012   Influenza Whole 08/15/2011   Influenza, High Dose Seasonal PF 07/27/2016, 07/27/2018   Influenza,inj,Quad PF,6+ Mos 08/12/2013, 07/18/2017   Influenza-Unspecified 07/15/2014, 10/15/2015, 08/02/2016   PFIZER(Purple Top)SARS-COV-2 Vaccination 01/16/2020, 02/12/2020, 10/05/2020   Pneumococcal Conjugate-13 11/01/2018   Pneumococcal Polysaccharide-23 08/11/2020   Td 01/28/2010   Tdap 08/11/2020   Zoster Recombinant(Shingrix) 06/10/2022   Zoster, Live 12/23/2013    Screening Tests Health Maintenance  Topic Date Due   INFLUENZA VACCINE  06/14/2024   Medicare Annual Wellness (AWV)  02/11/2025   Colonoscopy  11/30/2025   DTaP/Tdap/Td (3 - Td or Tdap) 08/11/2030   Pneumonia Vaccine 10+ Years old  Completed   Hepatitis C Screening  Completed   HPV VACCINES  Aged Out   COVID-19 Vaccine  Discontinued   Zoster Vaccines- Shingrix  Discontinued    Health Maintenance  There are no preventive care reminders to display for this patient.  Health Maintenance Items Addressed: See Nurse Notes  Additional Screening:  Vision  Screening: Recommended annual ophthalmology exams for early detection of glaucoma and other disorders of the eye.  Dental Screening: Recommended annual dental exams for proper oral hygiene  Community Resource Referral / Chronic Care Management: CRR required this visit?  No   CCM required this visit?  No     Plan:     I have personally reviewed and noted the following in the patient's chart:   Medical and social history Use of alcohol, tobacco or illicit drugs  Current medications and supplements including opioid prescriptions. Patient is not currently taking opioid prescriptions. Functional ability and status Nutritional status Physical activity Advanced directives List of other physicians Hospitalizations, surgeries, and ER visits in previous 12 months Vitals Screenings to include cognitive, depression, and falls Referrals and appointments  In addition, I have reviewed and discussed with patient certain preventive protocols, quality metrics, and best practice recommendations. A written personalized care plan for preventive services as well as general preventive health recommendations were provided to patient.     Siraj Dermody L Roberta Angell, CMA   02/14/2024   After Visit Summary: (MyChart) Due to this being a telephonic visit, the after visit summary with patients personalized plan was offered to patient via MyChart   Notes: Nothing significant to report at this time.  Medical screening examination/treatment/procedure(s) were performed by non-physician practitioner and as supervising physician I was immediately available for consultation/collaboration.  I agree with above. Jacinta Shoe, MD

## 2024-03-12 ENCOUNTER — Other Ambulatory Visit: Payer: Self-pay | Admitting: Internal Medicine

## 2024-03-20 ENCOUNTER — Other Ambulatory Visit (INDEPENDENT_AMBULATORY_CARE_PROVIDER_SITE_OTHER)

## 2024-03-20 DIAGNOSIS — R739 Hyperglycemia, unspecified: Secondary | ICD-10-CM | POA: Diagnosis not present

## 2024-03-20 LAB — COMPREHENSIVE METABOLIC PANEL WITH GFR
ALT: 21 U/L (ref 0–53)
AST: 18 U/L (ref 0–37)
Albumin: 4.5 g/dL (ref 3.5–5.2)
Alkaline Phosphatase: 63 U/L (ref 39–117)
BUN: 16 mg/dL (ref 6–23)
CO2: 28 meq/L (ref 19–32)
Calcium: 9.5 mg/dL (ref 8.4–10.5)
Chloride: 100 meq/L (ref 96–112)
Creatinine, Ser: 0.75 mg/dL (ref 0.40–1.50)
GFR: 91.3 mL/min (ref >60.00)
Glucose, Bld: 117 mg/dL — ABNORMAL HIGH (ref 70–99)
Potassium: 4.2 meq/L (ref 3.5–5.1)
Sodium: 136 meq/L (ref 135–145)
Total Bilirubin: 0.6 mg/dL (ref 0.2–1.2)
Total Protein: 7.4 g/dL (ref 6.0–8.3)

## 2024-03-20 LAB — HEMOGLOBIN A1C: Hgb A1c MFr Bld: 5.8 % (ref 4.6–6.5)

## 2024-04-09 ENCOUNTER — Encounter: Payer: Self-pay | Admitting: Internal Medicine

## 2024-04-09 ENCOUNTER — Ambulatory Visit (INDEPENDENT_AMBULATORY_CARE_PROVIDER_SITE_OTHER): Payer: Medicare HMO | Admitting: Internal Medicine

## 2024-04-09 VITALS — BP 106/68 | HR 78 | Temp 97.8°F | Ht 72.0 in | Wt 216.0 lb

## 2024-04-09 DIAGNOSIS — T7840XS Allergy, unspecified, sequela: Secondary | ICD-10-CM

## 2024-04-09 DIAGNOSIS — J320 Chronic maxillary sinusitis: Secondary | ICD-10-CM | POA: Diagnosis not present

## 2024-04-09 DIAGNOSIS — E66811 Obesity, class 1: Secondary | ICD-10-CM

## 2024-04-09 DIAGNOSIS — R739 Hyperglycemia, unspecified: Secondary | ICD-10-CM

## 2024-04-09 DIAGNOSIS — J4521 Mild intermittent asthma with (acute) exacerbation: Secondary | ICD-10-CM

## 2024-04-09 DIAGNOSIS — R42 Dizziness and giddiness: Secondary | ICD-10-CM

## 2024-04-09 DIAGNOSIS — M170 Bilateral primary osteoarthritis of knee: Secondary | ICD-10-CM | POA: Diagnosis not present

## 2024-04-09 DIAGNOSIS — E785 Hyperlipidemia, unspecified: Secondary | ICD-10-CM

## 2024-04-09 MED ORDER — LORATADINE 10 MG PO TABS
10.0000 mg | ORAL_TABLET | Freq: Every day | ORAL | 3 refills | Status: AC
Start: 2024-04-09 — End: ?

## 2024-04-09 NOTE — Assessment & Plan Note (Signed)
On Repatha Check lipids

## 2024-04-09 NOTE — Assessment & Plan Note (Signed)
 Doing well

## 2024-04-09 NOTE — Assessment & Plan Note (Signed)
 Resume Loratadine  for allergies

## 2024-04-09 NOTE — Progress Notes (Signed)
 Subjective:  Patient ID: [name], male    DOB: 1951-05-25  Age: 73 y.o. MRN: [id number]  CC: Medical Management of Chronic Issues (3 Month follow up. Review labs. Concerns with repatha  and increase of blood glucose (notes completely cancelling out added sugars and wants to make sure the repatha  isn't what's causing the increase). Cyst located on back, mid-spine. Most recent labs didn't include cholesterol and wants to make sure that is oka )   HPI [name] presents for OA, HTN, anxiety, dyslipidemia Not taking Tramadol  x 1 year  Pt lost wt on diet   Outpatient Medications Prior to Visit  Medication Sig Dispense Refill   acetaminophen  (TYLENOL ) 500 MG tablet Take 500 mg by mouth every 6 (six) hours as needed for headache.     ALPRAZolam  (XANAX ) 0.5 MG tablet Take 1 tablet by mouth twice daily as needed for anxiety 60 tablet 1   aspirin  81 MG tablet Take 81 mg by mouth at bedtime.     budesonide -formoterol  (SYMBICORT ) 80-4.5 MCG/ACT inhaler Inhale 2 puffs into the lungs every 12 (twelve) hours. 10.2 g 6   Cyanocobalamin  (VITAMIN B-12) 2500 MCG SUBL Place 5,000 mcg under the tongue daily.     escitalopram  (LEXAPRO ) 10 MG tablet Take 1 tablet by mouth once daily 90 tablet 1   Evolocumab  (REPATHA  SURECLICK) 140 MG/ML SOAJ INJECT 1 ML INTO THE SKIN EVERY 14 DAYS 2 mL 11   fluticasone  (FLONASE ) 50 MCG/ACT nasal spray Use 1 spray(s) in each nostril once daily 16 g 5   meloxicam  (MOBIC ) 7.5 MG tablet Take 1-2 tablets (7.5-15 mg total) by mouth daily. 180 tablet 0   Multiple Vitamin (MULTIVITAMIN WITH MINERALS) TABS tablet Take 1 tablet by mouth daily.     nitroGLYCERIN  (NITROSTAT ) 0.4 MG SL tablet Place 1 tablet (0.4 mg total) under the tongue every 5 (five) minutes as needed for chest pain. 25 tablet 9   pantoprazole  (PROTONIX ) 40 MG tablet Take 1 tablet by mouth twice daily 180 tablet 3   valACYclovir  (VALTREX ) 500 MG tablet Take 1 tablet by mouth once daily 90 tablet 0    loratadine  (CLARITIN ) 10 MG tablet TAKE 1 TABLET BY MOUTH ONCE DAILY AS NEEDED FOR ALLERGIES 90 tablet 0   No facility-administered medications prior to visit.    ROS: Review of Systems  Constitutional:  Negative for appetite change, fatigue and unexpected weight change.  HENT:  Positive for congestion. Negative for nosebleeds, sneezing, sore throat and trouble swallowing.   Eyes:  Negative for itching and visual disturbance.  Respiratory:  Negative for cough.   Cardiovascular:  Negative for chest pain, palpitations and leg swelling.  Gastrointestinal:  Negative for abdominal distention, blood in stool, diarrhea and nausea.  Genitourinary:  Negative for frequency and hematuria.  Musculoskeletal:  Positive for arthralgias, back pain and gait problem. Negative for joint swelling and neck pain.  Skin:  Negative for rash.  Neurological:  Negative for dizziness, tremors, speech difficulty and weakness.  Psychiatric/Behavioral:  Negative for agitation, dysphoric mood, sleep disturbance and suicidal ideas. The patient is not nervous/anxious.     Objective:  BP 106/68   Pulse 78   Temp 97.8 F (36.6 C)   Ht 6' (1.829 m)   Wt 216 lb (98 kg)   SpO2 97%   BMI 29.29 kg/m   BP Readings from Last 3 Encounters:  04/09/24 106/68  01/10/24 122/80  09/14/23 122/70    Wt Readings from Last 3 Encounters:  04/09/24  216 lb (98 kg)  02/12/24 232 lb (105.2 kg)  01/10/24 232 lb (105.2 kg)    Physical Exam Constitutional:      General: He is not in acute distress.    Appearance: He is well-developed. He is obese.     Comments: NAD  Eyes:     Conjunctiva/sclera: Conjunctivae normal.     Pupils: Pupils are equal, round, and reactive to light.  Neck:     Thyroid : No thyromegaly.     Vascular: No JVD.  Cardiovascular:     Rate and Rhythm: Normal rate and regular rhythm.     Heart sounds: Normal heart sounds. No murmur heard.    No friction rub. No gallop.  Pulmonary:     Effort:  Pulmonary effort is normal. No respiratory distress.     Breath sounds: Normal breath sounds. No wheezing or rales.  Chest:     Chest wall: No tenderness.  Abdominal:     General: Bowel sounds are normal. There is no distension.     Palpations: Abdomen is soft. There is no mass.     Tenderness: There is no abdominal tenderness. There is no guarding or rebound.  Musculoskeletal:        General: No tenderness. Normal range of motion.     Cervical back: Normal range of motion.  Lymphadenopathy:     Cervical: No cervical adenopathy.  Skin:    General: Skin is warm and dry.     Findings: No rash.  Neurological:     Mental Status: He is alert and oriented to person, place, and time.     Cranial Nerves: No cranial nerve deficit.     Motor: No abnormal muscle tone.     Coordination: Coordination normal.     Gait: Gait normal.     Deep Tendon Reflexes: Reflexes are normal and symmetric.  Psychiatric:        Behavior: Behavior normal.        Thought Content: Thought content normal.        Judgment: Judgment normal.   Seb cyst on back  Lab Results  Component Value Date   WBC 6.5 03/27/2023   HGB 15.0 03/27/2023   HCT 44.4 03/27/2023   PLT 255.0 03/27/2023   GLUCOSE 117 (H) 03/20/2024   CHOL 122 03/27/2023   TRIG 113.0 03/27/2023   HDL 42.30 03/27/2023   LDLDIRECT 110 (H) 10/07/2019   LDLCALC 57 03/27/2023   ALT 21 03/20/2024   AST 18 03/20/2024   NA 136 03/20/2024   K 4.2 03/20/2024   CL 100 03/20/2024   CREATININE 0.75 03/20/2024   BUN 16 03/20/2024   CO2 28 03/20/2024   TSH 3.03 03/27/2023   PSA 0.83 03/27/2023   INR 1.0 08/29/2017   HGBA1C 5.8 03/20/2024    CT ABDOMEN PELVIS W CONTRAST Result Date: 10/09/2023 CLINICAL DATA:  Lower abdominal pain EXAM: CT ABDOMEN AND PELVIS WITH CONTRAST TECHNIQUE: Multidetector CT imaging of the abdomen and pelvis was performed using the standard protocol following bolus administration of intravenous contrast. RADIATION DOSE  REDUCTION: This exam was performed according to the departmental dose-optimization program which includes automated exposure control, adjustment of the mA and/or kV according to patient size and/or use of iterative reconstruction technique. CONTRAST:  OMNIPAQUE  IOHEXOL  300 MG/ML  SOLN COMPARISON:  09/25/2008. FINDINGS: Lower chest: Minimal dependent basilar subsegmental atelectasis. No pericardial or pleural effusion. Hepatobiliary: No focal liver abnormality is seen. No gallstones, gallbladder wall thickening, or biliary dilatation. Pancreas:  Unremarkable. No pancreatic ductal dilatation or surrounding inflammatory changes. Spleen: Normal in size without focal abnormality. Adrenals/Urinary Tract: Adrenal glands are unremarkable. Right kidney parapelvic 3.5 cm cyst. No follow up recommended for this finding. Kidneys are otherwise normal, without renal calculi, focal lesion, or hydronephrosis. Bladder is unremarkable. Stomach/Bowel: Stomach is within normal limits. Appendix appears normal. No evidence of bowel wall thickening, distention, or inflammatory changes. A few sigmoid diverticula noted. Vascular/Lymphatic: Aortic atherosclerosis. No enlarged abdominal or pelvic lymph nodes. Reproductive: Prostate is unremarkable. Other: No abdominal wall hernia or abnormality. No abdominopelvic ascites. Musculoskeletal: Thoracolumbosacral degenerative changes. IMPRESSION: 1. No acute abdominal or pelvic pathology identified. 2. Right kidney parapelvic cyst. 3. Aortic atherosclerosis (ICD10-I70.0). Electronically Signed   By: Sydell Eva M.D.   On: 10/09/2023 13:31    Assessment & Plan:   Problem List Items Addressed This Visit     Dyslipidemia   On Repatha  Check lipids      Chronic sinusitis - Primary   Resume Loratadine  for allergies      Relevant Medications   loratadine  (CLARITIN ) 10 MG tablet   Hyperglycemia   Mild - A1c is nl Check HgbA1c      Relevant Orders   Comprehensive metabolic  panel with GFR   Lipid panel   Hemoglobin A1c   Dizzinesses   Resume Loratadine  for allergies      Allergy   Resume Loratadine  for allergies      OA (osteoarthritis) of knee   Not taking Tramadol  x 1 year      Obesity (BMI 30.0-34.9)   Pt lost 16 lbs on diet!      Asthmatic bronchitis   Doing well         Meds ordered this encounter  Medications   loratadine  (CLARITIN ) 10 MG tablet    Sig: Take 1 tablet (10 mg total) by mouth daily.    Dispense:  90 tablet    Refill:  3      Follow-up: Return in about 3 months (around 07/10/2024) for a follow-up visit.  Anitra Barn, MD

## 2024-04-09 NOTE — Assessment & Plan Note (Signed)
 Mild - A1c is nl Check HgbA1c

## 2024-04-09 NOTE — Assessment & Plan Note (Signed)
 Pt lost 16 lbs on diet!

## 2024-04-09 NOTE — Assessment & Plan Note (Signed)
 Not taking Tramadol  x 1 year

## 2024-04-22 ENCOUNTER — Other Ambulatory Visit: Payer: Self-pay | Admitting: Internal Medicine

## 2024-05-13 ENCOUNTER — Other Ambulatory Visit: Payer: Self-pay | Admitting: Internal Medicine

## 2024-06-05 ENCOUNTER — Other Ambulatory Visit: Payer: Self-pay | Admitting: Cardiology

## 2024-06-05 DIAGNOSIS — I2510 Atherosclerotic heart disease of native coronary artery without angina pectoris: Secondary | ICD-10-CM

## 2024-06-11 ENCOUNTER — Other Ambulatory Visit: Payer: Self-pay | Admitting: Internal Medicine

## 2024-06-20 ENCOUNTER — Other Ambulatory Visit: Payer: Self-pay | Admitting: Cardiology

## 2024-06-20 DIAGNOSIS — I700 Atherosclerosis of aorta: Secondary | ICD-10-CM

## 2024-06-20 DIAGNOSIS — E785 Hyperlipidemia, unspecified: Secondary | ICD-10-CM

## 2024-06-20 DIAGNOSIS — I25119 Atherosclerotic heart disease of native coronary artery with unspecified angina pectoris: Secondary | ICD-10-CM

## 2024-07-01 ENCOUNTER — Other Ambulatory Visit: Payer: Self-pay | Admitting: Internal Medicine

## 2024-07-10 ENCOUNTER — Other Ambulatory Visit (INDEPENDENT_AMBULATORY_CARE_PROVIDER_SITE_OTHER)

## 2024-07-10 DIAGNOSIS — R739 Hyperglycemia, unspecified: Secondary | ICD-10-CM

## 2024-07-10 LAB — COMPREHENSIVE METABOLIC PANEL WITH GFR
ALT: 20 U/L (ref 0–53)
AST: 16 U/L (ref 0–37)
Albumin: 4.2 g/dL (ref 3.5–5.2)
Alkaline Phosphatase: 55 U/L (ref 39–117)
BUN: 14 mg/dL (ref 6–23)
CO2: 26 meq/L (ref 19–32)
Calcium: 9.2 mg/dL (ref 8.4–10.5)
Chloride: 100 meq/L (ref 96–112)
Creatinine, Ser: 0.66 mg/dL (ref 0.40–1.50)
GFR: 94.69 mL/min (ref >60.00)
Glucose, Bld: 107 mg/dL — ABNORMAL HIGH (ref 70–99)
Potassium: 4.2 meq/L (ref 3.5–5.1)
Sodium: 135 meq/L (ref 135–145)
Total Bilirubin: 0.8 mg/dL (ref 0.2–1.2)
Total Protein: 7.4 g/dL (ref 6.0–8.3)

## 2024-07-10 LAB — LIPID PANEL
Cholesterol: 134 mg/dL (ref 0–200)
HDL: 43.3 mg/dL (ref >39.00)
LDL Cholesterol: 64 mg/dL (ref 0–99)
NonHDL: 91
Total CHOL/HDL Ratio: 3
Triglycerides: 133 mg/dL (ref 0.0–149.0)
VLDL: 26.6 mg/dL (ref 0.0–40.0)

## 2024-07-10 LAB — HEMOGLOBIN A1C: Hgb A1c MFr Bld: 5.9 % (ref 4.6–6.5)

## 2024-07-14 ENCOUNTER — Ambulatory Visit: Payer: Self-pay | Admitting: Internal Medicine

## 2024-07-17 ENCOUNTER — Encounter: Payer: Self-pay | Admitting: Internal Medicine

## 2024-07-17 ENCOUNTER — Ambulatory Visit: Admitting: Internal Medicine

## 2024-07-17 VITALS — BP 120/60 | HR 78 | Temp 97.9°F | Ht 72.0 in | Wt 211.0 lb

## 2024-07-17 DIAGNOSIS — M2550 Pain in unspecified joint: Secondary | ICD-10-CM

## 2024-07-17 DIAGNOSIS — R739 Hyperglycemia, unspecified: Secondary | ICD-10-CM | POA: Diagnosis not present

## 2024-07-17 DIAGNOSIS — Z23 Encounter for immunization: Secondary | ICD-10-CM | POA: Diagnosis not present

## 2024-07-17 DIAGNOSIS — M25561 Pain in right knee: Secondary | ICD-10-CM | POA: Diagnosis not present

## 2024-07-17 DIAGNOSIS — N320 Bladder-neck obstruction: Secondary | ICD-10-CM

## 2024-07-17 DIAGNOSIS — M25562 Pain in left knee: Secondary | ICD-10-CM | POA: Diagnosis not present

## 2024-07-17 DIAGNOSIS — E785 Hyperlipidemia, unspecified: Secondary | ICD-10-CM

## 2024-07-17 DIAGNOSIS — I25119 Atherosclerotic heart disease of native coronary artery with unspecified angina pectoris: Secondary | ICD-10-CM | POA: Diagnosis not present

## 2024-07-17 DIAGNOSIS — Z0000 Encounter for general adult medical examination without abnormal findings: Secondary | ICD-10-CM

## 2024-07-17 DIAGNOSIS — M51362 Other intervertebral disc degeneration, lumbar region with discogenic back pain and lower extremity pain: Secondary | ICD-10-CM

## 2024-07-17 NOTE — Assessment & Plan Note (Signed)
Cont on Repatha injections, ASA 

## 2024-07-17 NOTE — Assessment & Plan Note (Signed)
 Better w/wt loss, good diet; castor oil and cayenne pepper rub

## 2024-07-17 NOTE — Progress Notes (Signed)
 Subjective:  Patient ID: [name], male    DOB: [birth year]  Age: 73 y.o. MRN: [id number]  CC: Follow-up (28mo; only concerns is ongoing sinus problems)   HPI JARQUEZ MESTRE presents for chronic LBP, allergies - worse, CAD On a good diet Off Tramadol  since 2023  Outpatient Medications Prior to Visit  Medication Sig Dispense Refill   acetaminophen  (TYLENOL ) 500 MG tablet Take 500 mg by mouth every 6 (six) hours as needed for headache.     ALPRAZolam  (XANAX ) 0.5 MG tablet Take 1 tablet by mouth twice daily as needed for anxiety 60 tablet 0   aspirin  81 MG tablet Take 81 mg by mouth at bedtime.     budesonide -formoterol  (SYMBICORT ) 80-4.5 MCG/ACT inhaler Inhale 2 puffs into the lungs every 12 (twelve) hours. 10.2 g 6   clotrimazole (LOTRIMIN) 1 % cream Apply 1 Application topically as needed.     Cyanocobalamin  (VITAMIN B-12) 2500 MCG SUBL Place 5,000 mcg under the tongue daily.     escitalopram  (LEXAPRO ) 10 MG tablet Take 1 tablet by mouth once daily 90 tablet 1   fluticasone  (FLONASE ) 50 MCG/ACT nasal spray Use 1 spray(s) in each nostril once daily 16 g 5   loratadine  (CLARITIN ) 10 MG tablet Take 1 tablet (10 mg total) by mouth daily. 90 tablet 3   meloxicam  (MOBIC ) 7.5 MG tablet TAKE 1 TO 2 TABLETS BY MOUTH ONCE DAILY 180 tablet 0   Multiple Vitamin (MULTIVITAMIN WITH MINERALS) TABS tablet Take 1 tablet by mouth daily.     nitroGLYCERIN  (NITROSTAT ) 0.4 MG SL tablet DISSOLVE ONE TABLET UNDER THE TONGUE EVERY 5 MINUTES AS NEEDED FOR CHEST PAIN. 25 tablet 0   pantoprazole  (PROTONIX ) 40 MG tablet Take 1 tablet by mouth twice daily 180 tablet 3   REPATHA  SURECLICK 140 MG/ML SOAJ INJECT 1 ML SUBCUTANEOUSLY  EVERY TWO WEEKS 2 mL 0   valACYclovir  (VALTREX ) 500 MG tablet Take 1 tablet by mouth once daily 90 tablet 0   No facility-administered medications prior to visit.    ROS: Review of Systems  Constitutional:  Negative for appetite change, fatigue and unexpected weight change.   HENT:  Positive for postnasal drip and rhinorrhea. Negative for congestion, nosebleeds, sneezing, sore throat and trouble swallowing.   Eyes:  Negative for itching and visual disturbance.  Respiratory:  Negative for cough.   Cardiovascular:  Negative for chest pain, palpitations and leg swelling.  Gastrointestinal:  Negative for abdominal distention, blood in stool, diarrhea and nausea.  Genitourinary:  Negative for frequency and hematuria.  Musculoskeletal:  Positive for arthralgias, back pain and gait problem. Negative for joint swelling and neck pain.  Skin:  Negative for rash.  Neurological:  Negative for dizziness, tremors, speech difficulty and weakness.  Psychiatric/Behavioral:  Negative for agitation, dysphoric mood and sleep disturbance. The patient is not nervous/anxious.     Objective:  BP 120/60   Pulse 78   Temp 97.9 F (36.6 C)   Ht 6' (1.829 m)   Wt 211 lb (95.7 kg)   SpO2 98%   BMI 28.62 kg/m   BP Readings from Last 3 Encounters:  07/17/24 120/60  04/09/24 106/68  01/10/24 122/80    Wt Readings from Last 3 Encounters:  07/17/24 211 lb (95.7 kg)  04/09/24 216 lb (98 kg)  02/12/24 232 lb (105.2 kg)    Physical Exam Constitutional:      General: He is not in acute distress.    Appearance: Normal appearance. He is well-developed.  Comments: NAD  Eyes:     Conjunctiva/sclera: Conjunctivae normal.     Pupils: Pupils are equal, round, and reactive to light.  Neck:     Thyroid : No thyromegaly.     Vascular: No JVD.  Cardiovascular:     Rate and Rhythm: Normal rate and regular rhythm.     Heart sounds: Normal heart sounds. No murmur heard.    No friction rub. No gallop.  Pulmonary:     Effort: Pulmonary effort is normal. No respiratory distress.     Breath sounds: Normal breath sounds. No wheezing or rales.  Chest:     Chest wall: No tenderness.  Abdominal:     General: Bowel sounds are normal. There is no distension.     Palpations: Abdomen is  soft. There is no mass.     Tenderness: There is no abdominal tenderness. There is no guarding or rebound.  Musculoskeletal:        General: Tenderness present. Normal range of motion.     Cervical back: Normal range of motion.     Right lower leg: No edema.     Left lower leg: Edema present.  Lymphadenopathy:     Cervical: No cervical adenopathy.  Skin:    General: Skin is warm and dry.     Findings: No rash.  Neurological:     Mental Status: He is alert and oriented to person, place, and time.     Cranial Nerves: No cranial nerve deficit.     Motor: No abnormal muscle tone.     Coordination: Coordination normal.     Gait: Gait normal.     Deep Tendon Reflexes: Reflexes are normal and symmetric.  Psychiatric:        Behavior: Behavior normal.        Thought Content: Thought content normal.        Judgment: Judgment normal.   LS spine and knees w/pain  Lab Results  Component Value Date   WBC 6.5 03/27/2023   HGB 15.0 03/27/2023   HCT 44.4 03/27/2023   PLT 255.0 03/27/2023   GLUCOSE 107 (H) 07/10/2024   CHOL 134 07/10/2024   TRIG 133.0 07/10/2024   HDL 43.30 07/10/2024   LDLDIRECT 110 (H) 10/07/2019   LDLCALC 64 07/10/2024   ALT 20 07/10/2024   AST 16 07/10/2024   NA 135 07/10/2024   K 4.2 07/10/2024   CL 100 07/10/2024   CREATININE 0.66 07/10/2024   BUN 14 07/10/2024   CO2 26 07/10/2024   TSH 3.03 03/27/2023   PSA 0.83 03/27/2023   INR 1.0 08/29/2017   HGBA1C 5.9 07/10/2024    CT ABDOMEN PELVIS W CONTRAST Result Date: 10/09/2023 CLINICAL DATA:  Lower abdominal pain EXAM: CT ABDOMEN AND PELVIS WITH CONTRAST TECHNIQUE: Multidetector CT imaging of the abdomen and pelvis was performed using the standard protocol following bolus administration of intravenous contrast. RADIATION DOSE REDUCTION: This exam was performed according to the departmental dose-optimization program which includes automated exposure control, adjustment of the mA and/or kV according to patient  size and/or use of iterative reconstruction technique. CONTRAST:  OMNIPAQUE  IOHEXOL  300 MG/ML  SOLN COMPARISON:  09/25/2008. FINDINGS: Lower chest: Minimal dependent basilar subsegmental atelectasis. No pericardial or pleural effusion. Hepatobiliary: No focal liver abnormality is seen. No gallstones, gallbladder wall thickening, or biliary dilatation. Pancreas: Unremarkable. No pancreatic ductal dilatation or surrounding inflammatory changes. Spleen: Normal in size without focal abnormality. Adrenals/Urinary Tract: Adrenal glands are unremarkable. Right kidney parapelvic 3.5 cm cyst. No  follow up recommended for this finding. Kidneys are otherwise normal, without renal calculi, focal lesion, or hydronephrosis. Bladder is unremarkable. Stomach/Bowel: Stomach is within normal limits. Appendix appears normal. No evidence of bowel wall thickening, distention, or inflammatory changes. A few sigmoid diverticula noted. Vascular/Lymphatic: Aortic atherosclerosis. No enlarged abdominal or pelvic lymph nodes. Reproductive: Prostate is unremarkable. Other: No abdominal wall hernia or abnormality. No abdominopelvic ascites. Musculoskeletal: Thoracolumbosacral degenerative changes. IMPRESSION: 1. No acute abdominal or pelvic pathology identified. 2. Right kidney parapelvic cyst. 3. Aortic atherosclerosis (ICD10-I70.0). Electronically Signed   By: Fonda Field M.D.   On: 10/09/2023 13:31    Assessment & Plan:   Problem List Items Addressed This Visit     Arthralgia - Primary   Better w/wt loss, good diet; castor oil and cayenne pepper rub       Bladder neck obstruction   Relevant Orders   PSA   CAD (coronary artery disease)   Cont on Repatha  injections, ASA      Relevant Orders   Lipid panel   DDD (degenerative disc disease), lumbar   Better w/wt loss, good diet; castor oil and cayenne pepper rub      Dyslipidemia   Cont on Repatha  injections, ASA      Relevant Orders   TSH   Hyperglycemia    Relevant Orders   Comprehensive metabolic panel with GFR   Microalbumin / creatinine urine ratio   Hemoglobin A1c   KNEE PAIN   Off Tramadol  since 2023 On Meloxicam , Castor oil rub      Well adult exam   Relevant Orders   TSH   Urinalysis   CBC with Differential/Platelet   Lipid panel   PSA   Comprehensive metabolic panel with GFR   Microalbumin / creatinine urine ratio   Hemoglobin A1c      No orders of the defined types were placed in this encounter.     Follow-up: Return in about 6 months (around 01/14/2025) for Wellness Exam.  Marolyn Noel, MD

## 2024-07-17 NOTE — Assessment & Plan Note (Signed)
 Off Tramadol  since 2023 On Meloxicam , Castor oil rub

## 2024-07-23 ENCOUNTER — Other Ambulatory Visit: Payer: Self-pay | Admitting: Cardiology

## 2024-07-23 DIAGNOSIS — E785 Hyperlipidemia, unspecified: Secondary | ICD-10-CM

## 2024-07-23 DIAGNOSIS — I700 Atherosclerosis of aorta: Secondary | ICD-10-CM

## 2024-07-23 DIAGNOSIS — I25119 Atherosclerotic heart disease of native coronary artery with unspecified angina pectoris: Secondary | ICD-10-CM

## 2024-07-23 NOTE — Telephone Encounter (Signed)
 Rx refused. Patient needs appt w/ Dr Edwyna

## 2024-07-24 ENCOUNTER — Telehealth: Payer: Self-pay | Admitting: Cardiology

## 2024-07-24 DIAGNOSIS — I25119 Atherosclerotic heart disease of native coronary artery with unspecified angina pectoris: Secondary | ICD-10-CM

## 2024-07-24 DIAGNOSIS — E785 Hyperlipidemia, unspecified: Secondary | ICD-10-CM

## 2024-07-24 DIAGNOSIS — I700 Atherosclerosis of aorta: Secondary | ICD-10-CM

## 2024-07-24 MED ORDER — REPATHA SURECLICK 140 MG/ML ~~LOC~~ SOAJ: 140.0000 mg | SUBCUTANEOUS | 0 refills | Status: DC

## 2024-07-24 NOTE — Telephone Encounter (Signed)
*  STAT* If patient is at the pharmacy, call can be transferred to refill team.   1. Which medications need to be refilled? (please list name of each medication and dose if known)   REPATHA  SURECLICK 140 MG/ML SOAJ   2. Would you like to learn more about the convenience, safety, & potential cost savings by using the [facility]?   3. Are you open to using the [facility] (Type [facility]. ).  4. Which pharmacy/location (including street and city if local pharmacy) is medication to be sent to?  [id number] - [place], [place] - [address]   5. Do they need a 30 day or 90 day supply?   Patient stated he will take his last shot on 9/18.  Patient has appointment scheduled with Dr. Edwyna on 11/4.

## 2024-08-26 ENCOUNTER — Ambulatory Visit: Payer: Self-pay | Admitting: *Deleted

## 2024-08-26 ENCOUNTER — Telehealth: Payer: Self-pay | Admitting: Gastroenterology

## 2024-08-26 NOTE — Telephone Encounter (Signed)
 Patient calls stating that for several weeks now, he has had a bulge in his lower abdomen/ groin area that has gotten larger and has become painful at times. Becomes worse with lifting objects or if he has to strain with bowel movements. Also complains of constipation at times but says he just started back on IBGard a couple days ago for this. No melena or hematochezia. No nausea or vomiting associated.  Patient was noted to have hernia at 08/2023 GI appointment that was said to have needed repeat surgery. However, patient describes this bulging as being in a different location from the previously known hernia.  Patient is advised to reach out to his primary care physician for an updated evaluation of the area and possible referral to back to general surgery should his exam warrant this. Patient verbalizes understanding.

## 2024-08-26 NOTE — Telephone Encounter (Signed)
 He needs to see a Development worker, international aid

## 2024-08-26 NOTE — Telephone Encounter (Signed)
 FYI Only or Action Required?: FYI only for provider.  Patient was last seen in primary care on 07/17/2024 by Plotnikov, Karlynn GAILS, MD.  Called Nurse Triage reporting Cyst.  Symptoms began several weeks ago.  Interventions attempted: Nothing.  Symptoms are: gradually worsening.  Triage Disposition: See PCP When Office is Open (Within 3 Days)  Patient/caregiver understands and will follow disposition?: Yes   Reason for Disposition  [1] Small swelling or lump AND [2] unexplained AND [3] present > 1 week  Answer Assessment - Initial Assessment Questions 1. APPEARANCE of SWELLING: What does it look like?     Recent enlargement of lump 2. SIZE: How large is the swelling? (e.g., inches, cm; or compare to size of pinhead, tip of pen, eraser, coin, pea, grape, ping pong ball)      Golf ball size 3. LOCATION: Where is the swelling located?     Right groin area/pelvic 4. ONSET: When did the swelling start?     3 weeks ago- more with activity 5. COLOR: What color is it? Is there more than one color?     Normal color 6. PAIN: Is there any pain? If Yes, ask: How bad is the pain? (Scale 1-10; or mild, moderate, severe)       Pain present with activity and immediately after  8. CAUSE: What do you think caused the swelling?     Possible cyst- kidney  Protocols used: Skin Lump or Localized Swelling-A-AH   Copied from CRM [id number]. Topic: Clinical - Red Word Triage >> Aug 26, 2024 12:05 PM Thersia BROCKS wrote: Red Word that prompted transfer to Nurse Triage: Patient had a marble size knot under his belly button by his groin, stated provider stated he was okay, patient stated he is getting large, stated it is painful and uncomfortable from time to time

## 2024-08-26 NOTE — Telephone Encounter (Signed)
 Patient requesting to speak with a nurse in regards to spot on his lower abdomen. Please advise.

## 2024-08-28 ENCOUNTER — Encounter: Payer: Self-pay | Admitting: Internal Medicine

## 2024-08-28 ENCOUNTER — Ambulatory Visit: Admitting: Internal Medicine

## 2024-08-28 VITALS — BP 144/86 | HR 79 | Temp 97.6°F | Ht 72.0 in | Wt 210.5 lb

## 2024-08-28 DIAGNOSIS — K4090 Unilateral inguinal hernia, without obstruction or gangrene, not specified as recurrent: Secondary | ICD-10-CM | POA: Diagnosis not present

## 2024-08-28 DIAGNOSIS — K2100 Gastro-esophageal reflux disease with esophagitis, without bleeding: Secondary | ICD-10-CM | POA: Diagnosis not present

## 2024-08-28 DIAGNOSIS — M51362 Other intervertebral disc degeneration, lumbar region with discogenic back pain and lower extremity pain: Secondary | ICD-10-CM

## 2024-08-28 DIAGNOSIS — R1030 Lower abdominal pain, unspecified: Secondary | ICD-10-CM | POA: Diagnosis not present

## 2024-08-28 HISTORY — DX: Unilateral inguinal hernia, without obstruction or gangrene, not specified as recurrent: K40.90

## 2024-08-28 NOTE — Assessment & Plan Note (Signed)
 Due to R inguinal hernia Discussed

## 2024-08-28 NOTE — Progress Notes (Signed)
 Subjective:  Patient ID: [name], male    DOB: 1951/02/27  Age: 73 y.o. MRN: [id number]  CC: No chief complaint on file.   HPI BRYNE LINDON presents for painful swelling in the R groin  x 2 weeks off and on F/u on OA, GERD  Outpatient Medications Prior to Visit  Medication Sig Dispense Refill   acetaminophen  (TYLENOL ) 500 MG tablet Take 500 mg by mouth every 6 (six) hours as needed for headache.     ALPRAZolam  (XANAX ) 0.5 MG tablet Take 1 tablet by mouth twice daily as needed for anxiety 60 tablet 0   aspirin  81 MG tablet Take 81 mg by mouth at bedtime.     budesonide -formoterol  (SYMBICORT ) 80-4.5 MCG/ACT inhaler Inhale 2 puffs into the lungs every 12 (twelve) hours. 10.2 g 6   clotrimazole (LOTRIMIN) 1 % cream Apply 1 Application topically as needed.     Cyanocobalamin  (VITAMIN B-12) 2500 MCG SUBL Place 5,000 mcg under the tongue daily.     escitalopram  (LEXAPRO ) 10 MG tablet Take 1 tablet by mouth once daily 90 tablet 1   Evolocumab  (REPATHA  SURECLICK) 140 MG/ML SOAJ Inject 140 mg into the skin every 14 (fourteen) days. 4 mL 0   fluticasone  (FLONASE ) 50 MCG/ACT nasal spray Use 1 spray(s) in each nostril once daily 16 g 5   loratadine  (CLARITIN ) 10 MG tablet Take 1 tablet (10 mg total) by mouth daily. 90 tablet 3   meloxicam  (MOBIC ) 7.5 MG tablet TAKE 1 TO 2 TABLETS BY MOUTH ONCE DAILY 180 tablet 0   Multiple Vitamin (MULTIVITAMIN WITH MINERALS) TABS tablet Take 1 tablet by mouth daily.     nitroGLYCERIN  (NITROSTAT ) 0.4 MG SL tablet DISSOLVE ONE TABLET UNDER THE TONGUE EVERY 5 MINUTES AS NEEDED FOR CHEST PAIN. 25 tablet 0   pantoprazole  (PROTONIX ) 40 MG tablet Take 1 tablet by mouth twice daily 180 tablet 3   valACYclovir  (VALTREX ) 500 MG tablet Take 1 tablet by mouth once daily 90 tablet 0   No facility-administered medications prior to visit.    ROS: Review of Systems  Constitutional:  Negative for appetite change, fatigue and unexpected weight change.  HENT:  Negative  for congestion, nosebleeds, sneezing, sore throat and trouble swallowing.   Eyes:  Negative for itching and visual disturbance.  Respiratory:  Negative for cough.   Cardiovascular:  Negative for chest pain, palpitations and leg swelling.  Gastrointestinal:  Positive for abdominal pain. Negative for abdominal distention, blood in stool, diarrhea, nausea and vomiting.  Genitourinary:  Negative for frequency and hematuria.  Musculoskeletal:  Positive for arthralgias. Negative for back pain, gait problem, joint swelling and neck pain.  Skin:  Negative for color change and rash.  Neurological:  Negative for dizziness, tremors, speech difficulty and weakness.  Psychiatric/Behavioral:  Negative for agitation, dysphoric mood and sleep disturbance. The patient is not nervous/anxious.     Objective:  BP (!) 144/86   Pulse 79   Temp 97.6 F (36.4 C) (Temporal)   Ht 6' (1.829 m)   Wt 210 lb 8 oz (95.5 kg)   SpO2 97%   BMI 28.55 kg/m   BP Readings from Last 3 Encounters:  08/28/24 (!) 144/86  07/17/24 120/60  04/09/24 106/68    Wt Readings from Last 3 Encounters:  08/28/24 210 lb 8 oz (95.5 kg)  07/17/24 211 lb (95.7 kg)  04/09/24 216 lb (98 kg)    Physical Exam Constitutional:      General: He is not in  acute distress.    Appearance: He is well-developed.     Comments: NAD  Eyes:     Conjunctiva/sclera: Conjunctivae normal.     Pupils: Pupils are equal, round, and reactive to light.  Neck:     Thyroid : No thyromegaly.     Vascular: No JVD.  Cardiovascular:     Rate and Rhythm: Normal rate and regular rhythm.     Heart sounds: Normal heart sounds. No murmur heard.    No friction rub. No gallop.  Pulmonary:     Effort: Pulmonary effort is normal. No respiratory distress.     Breath sounds: Normal breath sounds. No wheezing or rales.  Chest:     Chest wall: No tenderness.  Abdominal:     General: Bowel sounds are normal. There is no distension.     Palpations: Abdomen is  soft. There is no mass.     Tenderness: There is no abdominal tenderness. There is no guarding or rebound.     Hernia: A hernia is present.  Musculoskeletal:        General: No tenderness. Normal range of motion.     Cervical back: Normal range of motion.  Lymphadenopathy:     Cervical: No cervical adenopathy.  Skin:    General: Skin is warm and dry.     Findings: No rash.  Neurological:     Mental Status: He is alert and oriented to person, place, and time.     Cranial Nerves: No cranial nerve deficit.     Motor: No abnormal muscle tone.     Coordination: Coordination normal.     Gait: Gait normal.     Deep Tendon Reflexes: Reflexes are normal and symmetric.  Psychiatric:        Behavior: Behavior normal.        Thought Content: Thought content normal.        Judgment: Judgment normal.   R inguinal hernia, small 4x3 cm, soft, sensitive  Lab Results  Component Value Date   WBC 6.5 03/27/2023   HGB 15.0 03/27/2023   HCT 44.4 03/27/2023   PLT 255.0 03/27/2023   GLUCOSE 107 (H) 07/10/2024   CHOL 134 07/10/2024   TRIG 133.0 07/10/2024   HDL 43.30 07/10/2024   LDLDIRECT 110 (H) 10/07/2019   LDLCALC 64 07/10/2024   ALT 20 07/10/2024   AST 16 07/10/2024   NA 135 07/10/2024   K 4.2 07/10/2024   CL 100 07/10/2024   CREATININE 0.66 07/10/2024   BUN 14 07/10/2024   CO2 26 07/10/2024   TSH 3.03 03/27/2023   PSA 0.83 03/27/2023   INR 1.0 08/29/2017   HGBA1C 5.9 07/10/2024    CT ABDOMEN PELVIS W CONTRAST Result Date: 10/09/2023 CLINICAL DATA:  Lower abdominal pain EXAM: CT ABDOMEN AND PELVIS WITH CONTRAST TECHNIQUE: Multidetector CT imaging of the abdomen and pelvis was performed using the standard protocol following bolus administration of intravenous contrast. RADIATION DOSE REDUCTION: This exam was performed according to the departmental dose-optimization program which includes automated exposure control, adjustment of the mA and/or kV according to patient size and/or use of  iterative reconstruction technique. CONTRAST:  OMNIPAQUE  IOHEXOL  300 MG/ML  SOLN COMPARISON:  09/25/2008. FINDINGS: Lower chest: Minimal dependent basilar subsegmental atelectasis. No pericardial or pleural effusion. Hepatobiliary: No focal liver abnormality is seen. No gallstones, gallbladder wall thickening, or biliary dilatation. Pancreas: Unremarkable. No pancreatic ductal dilatation or surrounding inflammatory changes. Spleen: Normal in size without focal abnormality. Adrenals/Urinary Tract: Adrenal glands are unremarkable.  Right kidney parapelvic 3.5 cm cyst. No follow up recommended for this finding. Kidneys are otherwise normal, without renal calculi, focal lesion, or hydronephrosis. Bladder is unremarkable. Stomach/Bowel: Stomach is within normal limits. Appendix appears normal. No evidence of bowel wall thickening, distention, or inflammatory changes. A few sigmoid diverticula noted. Vascular/Lymphatic: Aortic atherosclerosis. No enlarged abdominal or pelvic lymph nodes. Reproductive: Prostate is unremarkable. Other: No abdominal wall hernia or abnormality. No abdominopelvic ascites. Musculoskeletal: Thoracolumbosacral degenerative changes. IMPRESSION: 1. No acute abdominal or pelvic pathology identified. 2. Right kidney parapelvic cyst. 3. Aortic atherosclerosis (ICD10-I70.0). Electronically Signed   By: Fonda Field M.D.   On: 10/09/2023 13:31    Assessment & Plan:   Problem List Items Addressed This Visit     Abdominal pain   Due to R inguinal hernia Discussed      DDD (degenerative disc disease), lumbar   Better w/wt loss, good diet; castor oil and cayenne pepper rub      GERD   Cont on Protonix       Non-recurrent unilateral inguinal hernia without obstruction or gangrene - Primary   Surgery referral for R inguinal hernia - Dr Tanda Discussed Info provided      Relevant Orders   Ambulatory referral to General Surgery      No orders of the defined types were  placed in this encounter.     Follow-up: Return for a follow-up visit.  Marolyn Noel, MD

## 2024-08-28 NOTE — Assessment & Plan Note (Signed)
 Surgery referral for R inguinal hernia - Dr Tanda Discussed Info provided

## 2024-08-28 NOTE — Assessment & Plan Note (Signed)
 Better w/wt loss, good diet; castor oil and cayenne pepper rub

## 2024-08-28 NOTE — Assessment & Plan Note (Signed)
Cont on Protonix ?

## 2024-09-09 ENCOUNTER — Other Ambulatory Visit: Payer: Self-pay | Admitting: Internal Medicine

## 2024-09-12 DIAGNOSIS — I2510 Atherosclerotic heart disease of native coronary artery without angina pectoris: Secondary | ICD-10-CM | POA: Diagnosis not present

## 2024-09-12 DIAGNOSIS — M1710 Unilateral primary osteoarthritis, unspecified knee: Secondary | ICD-10-CM | POA: Diagnosis not present

## 2024-09-12 DIAGNOSIS — K429 Umbilical hernia without obstruction or gangrene: Secondary | ICD-10-CM | POA: Diagnosis not present

## 2024-09-12 DIAGNOSIS — K4090 Unilateral inguinal hernia, without obstruction or gangrene, not specified as recurrent: Secondary | ICD-10-CM | POA: Diagnosis not present

## 2024-09-17 ENCOUNTER — Encounter: Payer: Self-pay | Admitting: Cardiology

## 2024-09-17 ENCOUNTER — Ambulatory Visit: Attending: Cardiology | Admitting: Cardiology

## 2024-09-17 ENCOUNTER — Ambulatory Visit: Payer: Self-pay | Admitting: General Surgery

## 2024-09-17 VITALS — BP 134/74 | HR 69 | Ht 72.0 in | Wt 212.0 lb

## 2024-09-17 DIAGNOSIS — I10 Essential (primary) hypertension: Secondary | ICD-10-CM | POA: Diagnosis not present

## 2024-09-17 DIAGNOSIS — Z01810 Encounter for preprocedural cardiovascular examination: Secondary | ICD-10-CM

## 2024-09-17 DIAGNOSIS — I25119 Atherosclerotic heart disease of native coronary artery with unspecified angina pectoris: Secondary | ICD-10-CM

## 2024-09-17 DIAGNOSIS — I350 Nonrheumatic aortic (valve) stenosis: Secondary | ICD-10-CM | POA: Diagnosis not present

## 2024-09-17 DIAGNOSIS — E785 Hyperlipidemia, unspecified: Secondary | ICD-10-CM | POA: Diagnosis not present

## 2024-09-17 DIAGNOSIS — R0609 Other forms of dyspnea: Secondary | ICD-10-CM | POA: Diagnosis not present

## 2024-09-17 DIAGNOSIS — I700 Atherosclerosis of aorta: Secondary | ICD-10-CM | POA: Diagnosis not present

## 2024-09-17 DIAGNOSIS — I7121 Aneurysm of the ascending aorta, without rupture: Secondary | ICD-10-CM

## 2024-09-17 MED ORDER — METOPROLOL TARTRATE 100 MG PO TABS: 100.0000 mg | ORAL_TABLET | Freq: Once | ORAL | 0 refills | Status: DC

## 2024-09-17 NOTE — Progress Notes (Signed)
 Cardiology Office Note:    Date:  09/17/2024   ID:  [name], DOB 08/09/1951, MRN [id number]  PCP:  Andre Karlynn GAILS, MD  Cardiologist:  Andre JONELLE Crape, MD   Referring MD: Andre Karlynn GAILS, MD    ASSESSMENT:    1. Dyslipidemia   2. Preop cardiovascular exam   3. Coronary artery disease involving native coronary artery of native heart with angina pectoris   4. Aortic atherosclerosis   5. Aortic stenosis, moderate   6. Aneurysm of ascending aorta without rupture   7. Essential hypertension   8. Dyspnea on exertion    PLAN:    In order of problems listed above:  Preop cardiovascular assessment: Atherosclerotic vascular disease: I discussed my findings with the patient at length.  He has some dyspnea on exertion.  We opted to do CT coronary angiography with FFR.  This will also help us  assess aortic root and ascending aortic size.  He has history of aortic aneurysm.  Education was given about the symptoms. Ascending aortic aneurysm: As mentioned above. Mild to moderate aortic stenosis: Echocardiogram will help define this. If the aforementioned tests are negative then he is not at high risk for coronary events during the aforementioned surgery.  Meticulous hemodynamic monitoring will further reduce the risk of coronary events. Mixed dyslipidemia: Not on lipid-lowering medications.  Risks explained and he understands.  His lipids were discussed with him.  He is not keen on any lipid-lowering.  He is trying to do his best with diet and excise.  Patient will be seen in follow-up appointment in 6 months or earlier if the patient has any concerns.    Medication Adjustments/Labs and Tests Ordered: Current medicines are reviewed at length with the patient today.  Concerns regarding medicines are outlined above.  Orders Placed This Encounter  Procedures   CT CORONARY MORPH W/CTA COR W/SCORE W/CA W/CM &/OR WO/CM   EKG 12-Lead   ECHOCARDIOGRAM COMPLETE   Meds ordered this  encounter  Medications   metoprolol  tartrate (LOPRESSOR ) 100 MG tablet    Sig: Take 1 tablet (100 mg total) by mouth once for 1 dose. Take 2 hours prior to your CT if your heart rate is greater than 55    Dispense:  1 tablet    Refill:  0     No chief complaint on file.    History of Present Illness:    [name] is a 73 y.o. male.  Patient has a past medical history of atherosclerotic vascular disease, essential hypertension, mixed dyslipidemia, moderate aortic stenosis and ascending aortic aneurysm.  He leads a sedentary lifestyle because of orthopedic issues affecting his knees.  He is planning to have hernia surgery and he is here for preop assessment.  He gives history of some dyspnea on exertion.  No chest pain orthopnea or PND.  No syncope.  At the time of my evaluation, the patient is alert awake oriented and in no distress.  Past Medical History:  Diagnosis Date   Abdominal pain 01/10/2024   12/2023  [name], [name] and [name] (GI)ordered CT scan of the abdomen to evaluate the source of abd  pain, with a focus on the previous hernia repair site.   ?IBS - given IB Guard     Actinic keratosis    Acute sinusitis 06/21/2010   9/14, 10/19    Allergy    Anxiety 08/24/2017   Chronic  Worse in 10/18 Xanax  prn - d/c  Potential benefits  of a long term benzodiazepines  use as well as potential risks  and complications were explained to the patient and were aknowledged. 11/18 Lexapro      Aortic atherosclerosis 08/16/2022   Cont on Repatha  injections, ASA     Aortic stenosis, moderate 07/26/2022   2023 on ECHO: Mild to moderate aortic stenosis.  Medical management.     Arthralgia 04/14/2017   2018 2019 Rheum consult; Gluten free trial Andre [name] [facility]o-[facility]  IM Increase Tramadol  w/caution  Potential benefits of a long term opioids use as well as potential risks (i.e. addiction risk, apnea etc) and complications (i.e. Somnolence,  constipation and others) were explained to the patient and were aknowledged.   Ascending aortic aneurysm 07/26/2022   2023 ECHO 4.1 cm     Asthmatic bronchitis 10/26/2022   Onset fall 2023 much worse since 10/2022 chest congestion, cough: was using a jack hammer on quartz - breathing dust...  Use a face mask, filters, face shield  Chest CT in 08/03/22  was OK.  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 Pravastatin  - pt stopped due to side effects 1/21 Zetia  - causing pain too Krill oil d/c.  Repatha  option discussed.  9/21 Vascepa , Zetia    Chest pain 12/07/2020   Chondrocalcinosis 07/06/2018   Chronic sinusitis 08/12/2013    Sinus congestion, SOB - better after surgery - nov 2014 9/14 worse CT IMPRESSION:  Minimal sinus mucosal thickening, primarily in the frontal recesses.  Small right maxillary mucous retention cyst. No CT evidence of acute  sinusitis.  Electronically Signed  By: Jama Hurst M.D.  On: 08/15/2013 09:19  Doxy x 4 wks   Colonic polyp    CONTACT DERMATITIS DUE TO SOLVENTS    Cough 12/13/2022   Chronic   Pulm ref  R/o ILD, pulm HTN etc. H/o recent rock dust exposure  Prom cough syr     DDD (degenerative disc disease), cervical 07/06/2018   DDD (degenerative disc disease), lumbar 07/06/2018   and facet joint  arthropathy   Dizzinesses 07/18/2017   9/18 I suggested a brain MRI to r/o acustic neuroma etc: he will think about it and check on cost ENT ref suggested   Dyslipidemia 06/21/2013   Chronic  Elev TG, low HDL, Gill LDL Pravastatin  - pt stopped due to side effects 1/21 Repatha  option discussed. On Zetia  - c/o side effeects 9/21 Vascepa , Zetia    Dyspnea 07/25/2012   related to eating shrimp   Essential hypertension 07/29/2021   Fatigue 06/09/2015   2016 no OSA  2018 much better (60%) off Reglan .    Folliculitis 11/19/2018   GERD 05/09/2008   Chronic  -- Protonix  5/18 We discussed his GERD/Barrett's treated at  [facility] ([name]). Reglan  was d/c'd due to CP. He was dx'd w/pancreatitis and started on Zenpep He had an abd CT, US , EGD and colonoscopy. He would like to have a 2nd opinion here - will refer   GERD (gastroesophageal reflux disease)    Hand eczema 07/07/2015   8/16 dyshidrotic eczema B    HAND PAIN    Heart murmur    HERPES, GENITAL NOS 05/09/2008   Qualifier: Diagnosis of   By: Plotnikov MD, Karlynn Gill     Replacing diagnoses that were inactivated after the 02/13/23 regulatory import     History of colonic polyps 05/09/2008   Qualifier: Diagnosis of   By: Garald MD, Karlynn Gill     IMO SNOMED Dx Update Oct 2024     Hyperglycemia 06/09/2015   Mild     Hypogonadism in male 04/25/2018   Declined testosterone  Rx   Impacted cerumen of left ear 12/24/2020   Ingrowing toenail of left foot 07/25/2012   is resolved now 07-25-12   Insomnia 04/18/2022   6/23 Worse due to pain. Try Flexeril  at hs.   KNEE PAIN    Laryngopharyngeal reflux (LPR) 10/02/2018   Localized swelling, mass or lump of neck 09/05/2018   Low back pain 06/09/2015   Per Andre Ernie 3/17 - planning to see Andre Louis Body pains are much better (60%) off Reglan . Off Tramadol  2019 Rheum consult; Gluten free trial Tramadol   Potential benefits of a long term opioids use as well as potential risks (i.e. addiction risk, apnea  etc) and complications (i.e. Somnolence, constipation and others) were explained to the patient and were aknowledged.   Myalgia due to statin 08/16/2022   Nausea 07/18/2017   Chronic w/dizziness I suggested a brain MRI to r/o acustic neuroma etc: he will think about it and check on cost 9/18   Neoplasm of uncertain behavior of skin    Non-recurrent unilateral inguinal hernia without obstruction or gangrene 08/28/2024   Surgery referral for R inguinal hernia - Andre Tanda  Discussed  Info provided     OA (osteoarthritis) of knee    Obesity (BMI 30.0-34.9) 01/07/2021   ONYCHOMYCOSIS    OSA (obstructive sleep apnea) 08/21/2013   2014 no need for CPAP    Preop exam for internal medicine    Primary osteoarthritis of both hands 11/21/2008   Chronic and severe Tramadol   Potential benefits of a long term opioids use as well as potential risks (i.e. addiction risk, apnea etc) and complications (i.e. Somnolence, constipation and others) were explained to the patient and were aknowledged.   10/18 Body pains are much better (60%) off Reglan . 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 Xanax  prn - d/c  Potential benefits of a long term benzodiazepines  use as well as potential risks  and complications were explained to the patient and were aknowledged. Neurol ref - Andre Tat:   Hi   Umbilical hernia  05/24/2022   Relapsed Surgical ref   Upper respiratory infection 07/27/2018   9/19, 12/21   Well adult exam     Past Surgical History:  Procedure Laterality Date   COLONOSCOPY  04/24/2019   JOINT REPLACEMENT  9/13   L TKR   KNEE ARTHROSCOPY     Left   LEFT HEART CATH AND CORONARY ANGIOGRAPHY N/A 08/31/2017   Procedure: LEFT HEART CATH AND CORONARY ANGIOGRAPHY;  Surgeon: Mady Bruckner, MD;  Location: [facility];  Service: Cardiovascular;  Laterality: N/A;   TOTAL KNEE ARTHROPLASTY  07/31/2012   Procedure: TOTAL KNEE ARTHROPLASTY;  Surgeon: Lamar Collet, MD;  Location: [facility];  Service: Orthopedics;  Laterality: Left;   UMBILICAL HERNIA REPAIR  07-25-12   10 yrs ago   UPPER GASTROINTESTINAL ENDOSCOPY  04/24/2019    Current Medications: Current Meds  Medication Sig   acetaminophen  (TYLENOL ) 500 MG tablet Take 1,500 mg by mouth at bedtime.   aspirin  81 MG tablet Take 81 mg by mouth at bedtime.   Cyanocobalamin  (VITAMIN B-12) 2500 MCG SUBL Place 5,000 mcg under the tongue daily.   escitalopram  (LEXAPRO ) 10 MG tablet Take 1 tablet by mouth once daily   Evolocumab  (REPATHA  SURECLICK) 140 MG/ML SOAJ Inject 140 mg into the skin every 14 (fourteen) days.   loratadine  (CLARITIN ) 10 MG tablet Take 1 tablet (10 mg total) by mouth daily. (Patient taking differently: Take 10 mg by mouth as needed for allergies or rhinitis.)   metoprolol  tartrate (LOPRESSOR ) 100 MG tablet Take 1 tablet (100 mg total) by mouth once for 1 dose. Take 2 hours prior to your CT if your heart rate is greater than 55   Multiple Vitamin (MULTIVITAMIN WITH MINERALS) TABS tablet Take 1 tablet by mouth daily.   nitroGLYCERIN  (NITROSTAT ) 0.4 MG SL tablet DISSOLVE ONE TABLET UNDER THE TONGUE EVERY 5 MINUTES AS NEEDED FOR CHEST PAIN.   pantoprazole  (PROTONIX ) 40 MG tablet Take 1 tablet by mouth twice daily   SALINE NASAL SPRAY NA Place 1 spray into both nostrils daily as needed.   TURMERIC PO Take 2 capsules by  mouth in the morning.   valACYclovir  (VALTREX ) 500 MG tablet Take 1 tablet by mouth once daily   VITAMIN D  PO Take 2 capsules by mouth daily.     Allergies:   Hytrin  [terazosin ], Icosapent  ethyl (epa ethyl ester) (fish), Levaquin  [levofloxacin  in d5w], Lipitor [atorvastatin ], Nexletol  [bempedoic acid ], Penicillins, Reglan  [metoclopramide ], and Xanax  [alprazolam ]   Social History   Socioeconomic History   Marital status: Married    Spouse name: [place]   Number of children: 2   Years of education: Not on file   Highest education level: Not on file  Occupational History   Occupation: retired  Tobacco Use   Smoking status: Former    Types: Cigars    Quit date: 08/30/2007    Years since quitting: 17.0    Passive exposure: Past   Smokeless tobacco: Never   Tobacco comments:    Marijuana use some days.  Updated 03/14/2023 amy marsh, cma  Vaping Use   Vaping status: Never Used  Substance and Sexual Activity   Alcohol use: Yes    Comment: occ.    Drug use: Yes    Types: Marijuana    Comment: 2-3 days ago   Sexual activity: Yes    Partners: Female  Birth control/protection: None  Other Topics Concern   Not on file  Social History Narrative   Lives at home with wife and 2 dogs   Social Drivers of Health   Financial Resource Strain: High Risk (02/12/2024)   Overall Financial Resource Strain (CARDIA)    Difficulty of Paying Living Expenses: Hard  Food Insecurity: No Food Insecurity (02/12/2024)   Hunger Vital Sign    Worried About Running Out of Food in the Last Year: Never true    Ran Out of Food in the Last Year: Never true  Transportation Needs: No Transportation Needs (02/12/2024)   PRAPARE - Administrator, Civil Service (Medical): No    Lack of Transportation (Non-Medical): No  Physical Activity: Sufficiently Active (02/12/2024)   Exercise Vital Sign    Days of Exercise per Week: 5 days    Minutes of Exercise per Session: 60 min  Stress: No Stress Concern  Present (02/12/2024)   [employer] of Occupational Health - Occupational Stress Questionnaire    Feeling of Stress : Not at all  Social Connections: Moderately Isolated (02/12/2024)   Social Connection and Isolation Panel    Frequency of Communication with Friends and Family: Three times a week    Frequency of Social Gatherings with Friends and Family: Once a week    Attends Religious Services: Never    Database Administrator or Organizations: No    Attends Engineer, Structural: Never    Marital Status: Married     Family History: The patient's family history includes Colon cancer in his brother; Colon polyps in his brother; Coronary artery disease in an other family member; Diabetes (age of onset: 31) in his mother; Healthy in his daughter; Heart disease (age of onset: 16) in his father; Heart disease (age of onset: 62) in his brother; Lung cancer in his brother. There is no history of Stomach cancer, Rectal cancer, or Esophageal cancer.  ROS:   Please see the history of present illness.    All other systems reviewed and are negative.  EKGs/Labs/Other Studies Reviewed:    The following studies were reviewed today: .SABRAEKG Interpretation Date/Time:  Tuesday September 17 2024 08:50:11 EST Ventricular Rate:  69 PR Interval:  154 QRS Duration:  92 QT Interval:  386 QTC Calculation: 413 R Axis:   3  Text Interpretation: Normal sinus rhythm Minimal voltage criteria for LVH, may be normal variant ( R in aVL ) Cannot rule out Anterior infarct , age undetermined When compared with ECG of 04-Oct-2019 09:43, PREVIOUS ECG IS PRESENT Confirmed by Gill Backers [phone]) on 09/17/2024 9:17:38 AM     Recent Labs: 07/10/2024: ALT 20; BUN 14; Creatinine, Ser 0.66; Potassium 4.2; Sodium 135  Recent Lipid Panel    Component Value Date/Time   CHOL 134 07/10/2024 0824   CHOL 191 07/13/2022 0943   TRIG 133.0 07/10/2024 0824   HDL 43.30 07/10/2024 0824   HDL 44 07/13/2022 0943    CHOLHDL 3 07/10/2024 0824   VLDL 26.6 07/10/2024 0824   LDLCALC 64 07/10/2024 0824   LDLCALC 107 (H) 07/13/2022 0943   LDLDIRECT 110 (H) 10/07/2019 0910   LDLDIRECT 151.0 04/25/2018 0942    Physical Exam:    VS:  BP 134/74   Pulse 69   Ht 6' (1.829 m)   Wt 212 lb (96.2 kg)   SpO2 95%   BMI 28.75 kg/m     Wt Readings from Last 3 Encounters:  09/17/24 212 lb (96.2 kg)  08/28/24 210 lb 8 oz (95.5 kg)  07/17/24 211 lb (95.7 kg)     GEN: Patient is in no acute distress HEENT: Normal NECK: No JVD; No carotid bruits LYMPHATICS: No lymphadenopathy CARDIAC: Hear sounds regular, 2/6 systolic murmur at the apex. RESPIRATORY:  Clear to auscultation without rales, wheezing or rhonchi  ABDOMEN: Soft, non-tender, non-distended MUSCULOSKELETAL:  No edema; No deformity  SKIN: Warm and dry NEUROLOGIC:  Alert and oriented x 3 PSYCHIATRIC:  Normal affect   Signed, Andre JONELLE Crape, MD  09/17/2024 9:34 AM    [facility]

## 2024-09-17 NOTE — Progress Notes (Signed)
 COVID Vaccine received:  []  No [x]  Yes Date of any COVID positive Test in last 90 days:  PCP - Karlynn Perkins, MD [phone] Cardiologist - Jennifer Crape, MD cardiac clearance pending ECHO- 09-18-24 and CTA scoring 09-20-24   Chest x-ray - 01-04-23  2v   EKG - 09-17-2024    Stress Test - 12-16-2020    ECHO - Ordered for 09-18-24  Cardiac Cath - 08-31-2017  LHC / Cors  for angina, by Dr. Mady CT Coronary Calcium  score: ordered for 09-20-24    Pacemaker / ICD device [x]  No []  Yes   Spinal Cord Stimulator:[x]  No []  Yes       History of Sleep Apnea? []  No [x]  Yes   CPAP used?- [x]  No []  Yes    Patient has: [x]  NO Hx DM   []  Pre-DM   []  DM1  []   DM2 Does the patient monitor blood sugar?   [x]  N/A   []  No []  Yes  Last A1c was: 5.9 normal on 07-07-24      Blood Thinner / Instructions: Aspirin  Instructions:  Comments:   Activity level: Able to walk up 2 flights of stairs without becoming significantly short of breath or having chest pain?  []  No   []    Yes  Patient can perform ADLs without assistance. []  No   []   Yes  Anesthesia review: nonobstructive CAD on LHC in 2018, HTN,  Moderate AS (ECHO 2023- repeat ordered 09-18-24), GERD, Anxiety, Heart Murmur OSA- no CPAP  Patient denies any S&S of respiratory illness or Covid - no shortness of breath, fever, cough or chest pain at PAT appointment.  Patient verbalized understanding and agreement to the Pre-Surgical Instructions that were given to them at this PAT appointment. Patient was also educated of the need to review these PAT instructions again prior to his surgery.I reviewed the appropriate phone numbers to call if they have any and questions or concerns.

## 2024-09-17 NOTE — Progress Notes (Signed)
 Sent message, via epic in basket, requesting orders in epic from Careers adviser.

## 2024-09-17 NOTE — Patient Instructions (Signed)
 Medication Instructions:  Your physician recommends that you continue on your current medications as directed. Please refer to the Current Medication list given to you today.  *If you need a refill on your cardiac medications before your next appointment, please call your pharmacy*   Lab Work: None ordered If you have labs (blood work) drawn today and your tests are completely normal, you will receive your results only by: MyChart Message (if you have MyChart) OR A paper copy in the mail If you have any lab test that is abnormal or we need to change your treatment, we will call you to review the results.  Testing/Procedures: Your physician has requested that you have an echocardiogram. Echocardiography is a painless test that uses sound waves to create images of your heart. It provides your doctor with information about the size and shape of your heart and how well your heart's chambers and valves are working. This procedure takes approximately one hour. There are no restrictions for this procedure. Please do NOT wear cologne, perfume, aftershave, or lotions (deodorant is allowed). Please arrive 15 minutes prior to your appointment time.  Please note: We ask at that you not bring children with you during ultrasound (echo/ vascular) testing. Due to room size and safety concerns, children are not allowed in the ultrasound rooms during exams. Our front office staff cannot provide observation of children in our lobby area while testing is being conducted. An adult accompanying a patient to their appointment will only be allowed in the ultrasound room at the discretion of the ultrasound technician under special circumstances. We apologize for any inconvenience.    Your cardiac CT will be scheduled at one of the below locations:   [facility] [address]  [place], [place] [address]  If scheduled at the [facility] at [address], please enter the  parking lot using the [address] entrance and use the FREE valet service at the patient drop-off area. Enter the building and check-in with registration on the main floor.  Please follow these instructions carefully (unless otherwise directed):  An IV will be required for this test and Nitroglycerin  will be given.  Hold all erectile dysfunction medications at least 3 days (72 hrs) prior to test. (Ie viagra, cialis, sildenafil, tadalafil, etc)   On the Night Before the Test: Be sure to Drink plenty of water. Do not consume any caffeinated/decaffeinated beverages or chocolate 12 hours prior to your test. Do not take any antihistamines 12 hours prior to your test.  On the Day of the Test: Drink plenty of water until 1 hour prior to the test. Do not eat any food 1 hour prior to test. You may take your regular medications prior to the test.  Take metoprolol  (Lopressor ) 100 mg two hours prior to test.      After the Test: Drink plenty of water. After receiving IV contrast, you may experience a mild flushed feeling. This is normal. On occasion, you may experience a mild rash up to 24 hours after the test. This is not dangerous. If this occurs, you can take Benadryl  25 mg, Zyrtec, Claritin , or Allegra and increase your fluid intake. (Patients taking Tikosyn should avoid Benadryl , and may take Zyrtec, Claritin , or Allegra) If you experience trouble breathing, this can be serious. If it is severe call 911 IMMEDIATELY. If it is mild, please call our office.  We will call to schedule your test 2-4 weeks out understanding that some insurance  companies will need an authorization prior to the service being performed.   For more information and frequently asked questions, please visit our website : [email or web address]  For non-scheduling related questions, please contact the cardiac imaging nurse navigator should you have any questions/concerns: Cardiac Imaging Nurse  Navigators Direct Office Dial: [phone]   For scheduling needs, including cancellations and rescheduling, please call Brittany, [phone].   Follow-Up: At [facility], you and your health needs are our priority.  As part of our continuing mission to provide you with exceptional heart care, we have created designated Provider Care Teams.  These Care Teams include your primary Cardiologist (physician) and Advanced Practice Providers (APPs -  Physician Assistants and Nurse Practitioners) who all work together to provide you with the care you need, when you need it.  We recommend signing up for the patient portal called MyChart.  Sign up information is provided on this After Visit Summary.  MyChart is used to connect with patients for Virtual Visits (Telemedicine).  Patients are able to view lab/test results, encounter notes, upcoming appointments, etc.  Non-urgent messages can be sent to your provider as well.   To learn more about what you can do with MyChart, go to [email or web address].    Your next appointment:   9 month(s)  The format for your next appointment:   In Person  Provider:   Jennifer Crape, MD   Other Instructions Echocardiogram An echocardiogram is a test that uses sound waves (ultrasound) to produce images of the heart. Images from an echocardiogram can provide important information about: Heart size and shape. The size and thickness and movement of your heart's walls. Heart muscle function and strength. Heart valve function or if you have stenosis. Stenosis is when the heart valves are too narrow. If blood is flowing backward through the heart valves (regurgitation). A tumor or infectious growth around the heart valves. Areas of heart muscle that are not working well because of poor blood flow or injury from a heart attack. Aneurysm detection. An aneurysm is a weak or damaged part of an artery wall. The wall bulges out from the normal force of blood  pumping through the body. Tell a health care provider about: Any allergies you have. All medicines you are taking, including vitamins, herbs, eye drops, creams, and over-the-counter medicines. Any blood disorders you have. Any surgeries you have had. Any medical conditions you have. Whether you are pregnant or may be pregnant. What are the risks? Generally, this is a safe test. However, problems may occur, including an allergic reaction to dye (contrast) that may be used during the test. What happens before the test? No specific preparation is needed. You may eat and drink normally. What happens during the test? You will take off your clothes from the waist up and put on a hospital gown. Electrodes or electrocardiogram (ECG)patches may be placed on your chest. The electrodes or patches are then connected to a device that monitors your heart rate and rhythm. You will lie down on a table for an ultrasound exam. A gel will be applied to your chest to help sound waves pass through your skin. A handheld device, called a transducer, will be pressed against your chest and moved over your heart. The transducer produces sound waves that travel to your heart and bounce back (or echo back) to the transducer. These sound waves will be captured in real-time and changed into images of your heart that can be viewed on a video monitor. The images will  be recorded on a computer and reviewed by your health care provider. You may be asked to change positions or hold your breath for a short time. This makes it easier to get different views or better views of your heart. In some cases, you may receive contrast through an IV in one of your veins. This can improve the quality of the pictures from your heart. The procedure may vary among health care providers and hospitals.   What can I expect after the test? You may return to your normal, everyday life, including diet, activities, and medicines, unless your health  care provider tells you not to do that. Follow these instructions at home: It is up to you to get the results of your test. Ask your health care provider, or the department that is doing the test, when your results will be ready. Keep all follow-up visits. This is important. Summary An echocardiogram is a test that uses sound waves (ultrasound) to produce images of the heart. Images from an echocardiogram can provide important information about the size and shape of your heart, heart muscle function, heart valve function, and other possible heart problems. You do not need to do anything to prepare before this test. You may eat and drink normally. After the echocardiogram is completed, you may return to your normal, everyday life, unless your health care provider tells you not to do that. This information is not intended to replace advice given to you by your health care provider. Make sure you discuss any questions you have with your health care provider. Document Revised: 06/23/2020 Document Reviewed: 06/23/2020 Elsevier Patient Education  2021 [employer] Inc.   Important Information About Sugar

## 2024-09-17 NOTE — Patient Instructions (Signed)
 SURGICAL WAITING ROOM VISITATION Patients having surgery or a procedure may have no more than 2 support people in the waiting area - these visitors may rotate in the visitor waiting room.   If the patient needs to stay at the hospital during part of their recovery, the visitor guidelines for inpatient rooms apply.  PRE-OP VISITATION  Pre-op nurse will coordinate an appropriate time for 1 support person to accompany the patient in pre-op.  This support person may not rotate.  This visitor will be contacted when the time is appropriate for the visitor to come back in the pre-op area.  Please refer to the [facility] website for the visitor guidelines for Inpatients (after your surgery is over and you are in a regular room).  You are not required to quarantine at this time prior to your surgery. However, you must do this: Hand Hygiene often Do NOT share personal items Notify your provider if you are in close contact with someone who has COVID or you develop fever 100.4 or greater, new onset of sneezing, cough, sore throat, shortness of breath or body aches.  If you test positive for Covid or have been in contact with anyone that has tested positive in the last 10 days please notify you surgeon.    Your procedure is scheduled on:  MONDAY  September 23, 2024  Report to [facility] Main Entrance: [place] entrance where the [employer] is available.   Report to admitting at:  06:00   AM  Call this number if you have any questions or problems the morning of surgery [phone]  FOLLOW ANY ADDITIONAL PRE OP INSTRUCTIONS YOU RECEIVED FROM YOUR SURGEON'S OFFICE!!!  Do not eat food after Midnight the night prior to your surgery/procedure.  After Midnight you may have the following liquids until  05:15  AM DAY OF SURGERY  Clear Liquid Diet Water Black Coffee (sugar ok, NO MILK/CREAM OR CREAMERS)  Tea (sugar ok, NO MILK/CREAM OR CREAMERS) regular and decaf                              Plain Jell-O  with no fruit (NO RED)                                           Fruit ices (not with fruit pulp, NO RED)                                     Popsicles (NO RED)                                                                  Juice: NO CITRUS JUICES: only apple, WHITE grape, WHITE cranberry Sports drinks like Gatorade or Powerade (NO RED)                  Oral Hygiene is also important to reduce your risk of infection.        Remember - BRUSH YOUR TEETH THE MORNING OF SURGERY WITH YOUR REGULAR TOOTHPASTE  Do NOT smoke after Midnight the night before surgery.  STOP TAKING all Vitamins, Herbs and supplements 1 week before your surgery.   Take ONLY these medicines the morning of surgery with A SIP OF WATER: Pantoprazole , metoprolol , escitalopram , and Tylenol  if needed for pain. You may use your nasal spray if needed.     You may not have any metal on your body including  jewelry, and body piercing  Do not wear  lotions, powders,  cologne, or deodorant  Men may shave face and neck.  Contacts, Hearing Aids, dentures or bridgework may not be worn into surgery. DENTURES WILL BE REMOVED PRIOR TO SURGERY PLEASE DO NOT APPLY Poly grip OR ADHESIVES!!!  Patients discharged on the day of surgery will not be allowed to drive home.  Someone NEEDS to stay with you for the first 24 hours after anesthesia.  Do not bring your home medications to the hospital. The Pharmacy will dispense medications listed on your medication list to you during your admission in the Hospital.  Please read over the following fact sheets you were given: IF YOU HAVE QUESTIONS ABOUT YOUR PRE-OP INSTRUCTIONS, PLEASE CALL [phone]   [facility] - Preparing for Surgery        Before surgery, you can play an important role.  Because skin is not sterile, your skin needs to be as free of germs as possible.  You can reduce the number of germs on your skin by washing with CHG (chlorahexidine gluconate) soap  before surgery.  CHG is an antiseptic cleaner which kills germs and bonds with the skin to continue killing germs even after washing. Please DO NOT use if you have an allergy to CHG or antibacterial soaps.  If your skin becomes reddened/irritated stop using the CHG and inform your nurse when you arrive at Short Stay. Do not shave (including legs and underarms) for at least 48 hours prior to the first CHG shower.  You may shave your face/neck.  Please follow these instructions carefully:  1.  Shower with CHG Soap the night before surgery ONLY (DO NOT USE THE CHG SOAP THE MORNING OF SURGERY).  2.  If you choose to wash your hair, wash your hair first as usual with your normal  shampoo.  3.  After you shampoo, rinse your hair and body thoroughly to remove the shampoo.                             4.  Use CHG as you would any other liquid soap.  You can apply chg directly to the skin and wash.  Gently with a scrungie or clean washcloth.  5.  Apply the CHG Soap to your body ONLY FROM THE NECK DOWN.   Do not use on face/ open                           Wound or open sores. Avoid contact with eyes, ears mouth and genitals (private parts).                       Wash face,  Genitals (private parts) with your normal soap.             6.  Wash thoroughly, paying special attention to the area where your  surgery  will be performed.  7.  Thoroughly rinse your body with warm water from the neck down.  8.  DO NOT shower/wash with your normal soap after using and rinsing off the CHG Soap.                9.  Pat yourself dry with a clean towel.            10.  Wear clean pajamas.            11.  Place clean sheets on your bed the night of your first shower and do not  sleep with pets.  Day of Surgery : Do not apply any CHG, lotions/deodorants the morning of surgery.  Please wear clean clothes to the hospital/surgery center.   FAILURE TO FOLLOW THESE INSTRUCTIONS MAY RESULT IN THE CANCELLATION OF YOUR  SURGERY  PATIENT SIGNATURE_________________________________  NURSE SIGNATURE__________________________________  ________________________________________________________________________

## 2024-09-18 ENCOUNTER — Ambulatory Visit: Attending: Cardiology

## 2024-09-18 ENCOUNTER — Ambulatory Visit: Payer: Self-pay | Admitting: Cardiology

## 2024-09-18 ENCOUNTER — Ambulatory Visit

## 2024-09-18 DIAGNOSIS — I25119 Atherosclerotic heart disease of native coronary artery with unspecified angina pectoris: Secondary | ICD-10-CM

## 2024-09-18 DIAGNOSIS — I700 Atherosclerosis of aorta: Secondary | ICD-10-CM | POA: Diagnosis not present

## 2024-09-18 DIAGNOSIS — E785 Hyperlipidemia, unspecified: Secondary | ICD-10-CM

## 2024-09-18 DIAGNOSIS — Z01810 Encounter for preprocedural cardiovascular examination: Secondary | ICD-10-CM | POA: Diagnosis not present

## 2024-09-18 LAB — ECHOCARDIOGRAM COMPLETE
AR max vel: 1.44 cm2
AV Area VTI: 1.41 cm2
AV Area mean vel: 1.34 cm2
AV Mean grad: 14 mmHg
AV Peak grad: 25.3 mmHg
Ao pk vel: 2.51 m/s
Area-P 1/2: 2.71 cm2
P 1/2 time: 761 ms
S' Lateral: 2.8 cm

## 2024-09-19 ENCOUNTER — Encounter (HOSPITAL_COMMUNITY)
Admission: RE | Admit: 2024-09-19 | Discharge: 2024-09-19 | Disposition: A | Discharge status: Home / Self Care | Source: Ambulatory Visit | Attending: General Surgery | Admitting: General Surgery

## 2024-09-19 ENCOUNTER — Encounter (HOSPITAL_COMMUNITY): Payer: Self-pay

## 2024-09-19 ENCOUNTER — Other Ambulatory Visit: Payer: Self-pay

## 2024-09-19 VITALS — BP 122/78 | HR 65 | Temp 98.5°F | Resp 20 | Ht 72.0 in | Wt 210.0 lb

## 2024-09-19 DIAGNOSIS — I2510 Atherosclerotic heart disease of native coronary artery without angina pectoris: Secondary | ICD-10-CM | POA: Diagnosis not present

## 2024-09-19 DIAGNOSIS — Z01818 Encounter for other preprocedural examination: Secondary | ICD-10-CM

## 2024-09-19 DIAGNOSIS — Z01812 Encounter for preprocedural laboratory examination: Secondary | ICD-10-CM | POA: Diagnosis not present

## 2024-09-19 DIAGNOSIS — Z79899 Other long term (current) drug therapy: Secondary | ICD-10-CM | POA: Diagnosis not present

## 2024-09-19 DIAGNOSIS — I10 Essential (primary) hypertension: Secondary | ICD-10-CM | POA: Insufficient documentation

## 2024-09-19 HISTORY — DX: Myoneural disorder, unspecified: G70.9

## 2024-09-19 LAB — COMPREHENSIVE METABOLIC PANEL WITH GFR
ALT: 22 U/L (ref 0–44)
AST: 22 U/L (ref 15–41)
Albumin: 4.1 g/dL (ref 3.5–5.0)
Alkaline Phosphatase: 61 U/L (ref 38–126)
Anion gap: 9 (ref 5–15)
BUN: 16 mg/dL (ref 8–23)
CO2: 26 mmol/L (ref 22–32)
Calcium: 9.4 mg/dL (ref 8.9–10.3)
Chloride: 102 mmol/L (ref 98–111)
Creatinine, Ser: 0.71 mg/dL (ref 0.61–1.24)
GFR, Estimated: >60 mL/min (ref >60)
Glucose, Bld: 106 mg/dL — ABNORMAL HIGH (ref 70–99)
Potassium: 4.4 mmol/L (ref 3.5–5.1)
Sodium: 137 mmol/L (ref 135–145)
Total Bilirubin: 0.5 mg/dL (ref 0.0–1.2)
Total Protein: 7.4 g/dL (ref 6.5–8.1)

## 2024-09-19 LAB — CBC
HCT: 42.4 % (ref 39.0–52.0)
Hemoglobin: 14.1 g/dL (ref 13.0–17.0)
MCH: 32.3 pg (ref 26.0–34.0)
MCHC: 33.3 g/dL (ref 30.0–36.0)
MCV: 97 fL (ref 80.0–100.0)
Platelets: 237 K/uL (ref 150–400)
RBC: 4.37 MIL/uL (ref 4.22–5.81)
RDW: 12.7 % (ref 11.5–15.5)
WBC: 7.4 K/uL (ref 4.0–10.5)
nRBC: 0 % (ref 0.0–0.2)

## 2024-09-20 ENCOUNTER — Telehealth: Payer: Self-pay | Admitting: Cardiology

## 2024-09-20 ENCOUNTER — Ambulatory Visit (HOSPITAL_COMMUNITY)
Admission: RE | Admit: 2024-09-20 | Discharge: 2024-09-20 | Disposition: A | Discharge status: Home / Self Care | Source: Ambulatory Visit | Attending: Internal Medicine | Admitting: Internal Medicine

## 2024-09-20 ENCOUNTER — Other Ambulatory Visit (HOSPITAL_COMMUNITY)

## 2024-09-20 DIAGNOSIS — Z01810 Encounter for preprocedural cardiovascular examination: Secondary | ICD-10-CM | POA: Diagnosis not present

## 2024-09-20 DIAGNOSIS — R079 Chest pain, unspecified: Secondary | ICD-10-CM | POA: Diagnosis not present

## 2024-09-20 DIAGNOSIS — I25119 Atherosclerotic heart disease of native coronary artery with unspecified angina pectoris: Secondary | ICD-10-CM | POA: Diagnosis not present

## 2024-09-20 MED ORDER — NITROGLYCERIN 0.4 MG SL SUBL: 0.8000 mg | SUBLINGUAL_TABLET | Freq: Once | SUBLINGUAL | Status: DC

## 2024-09-20 MED ORDER — IOHEXOL 350 MG/ML SOLN
95.0000 mL | Freq: Once | INTRAVENOUS | Status: AC | PRN
  Administered 2024-09-20: 95 mL via INTRAVENOUS

## 2024-09-20 NOTE — Telephone Encounter (Signed)
 Pt is requesting a callback regarding him stating he seen MD on 09/17/24 and was advised to have a CT done and an ECHO before his procedure on Monday. Pt stated he hasn't heard anything and is getting concerned since his procedure is Monday and he needs to know what to do and if he's okay to move forward with having it done. Please advise

## 2024-09-20 NOTE — Progress Notes (Signed)
 Anesthesia Chart Review   Case: [id number] Date/Time: 09/23/24 0800   Procedure: REPAIR, HERNIA, INGUINAL, ROBOT-ASSISTED, LAPAROSCOPIC, USING MESH (Right) - ROBOTIC RIGHT INGUINAL HERNIA REPAIR WITH MESH   Anesthesia type: General   Pre-op diagnosis: RIGHT INGUINAL HERNIA   Location: [facility] ROOM 05 / [facility]   Surgeons: Andre Locus, MD       DISCUSSION:73 y.o. former smoker with h/o HTN, OSA, CAD, mild AS, right inguinal hernia scheduled for above procedure 09/23/2024 with Andre. Locus Andre.   Pt seen by cardiology 09/17/2024 for preoperative evaluation. Per OV note,  1. Preop cardiovascular assessment: Atherosclerotic vascular disease: I discussed my findings with the patient at length.  He has some dyspnea on exertion.  We opted to do CT coronary angiography with FFR.  This will also help us  assess aortic root and ascending aortic size.  He has history of aortic aneurysm.  Education was given about the symptoms. 2. Ascending aortic aneurysm: As mentioned above. 3. Mild to moderate aortic stenosis: Echocardiogram will help define this. 4. If the aforementioned tests are negative then he is not at high risk for coronary events during the aforementioned surgery.  Meticulous hemodynamic monitoring will further reduce the risk of coronary events.  Coronary CT 09/20/2024, pending results.  VS: BP 122/78 Comment: right arm sitting  Pulse 65   Temp 36.9 C (Oral)   Resp 20   Ht 6' (1.829 m)   Wt 95.3 kg   BMI 28.48 kg/m   PROVIDERS: [name], Andre GAILS, MD is PCP   Cardiologist - Andre Crape, MD  LABS: Labs reviewed: Acceptable for surgery. (all labs ordered are listed, but only abnormal results are displayed)  Labs Reviewed  COMPREHENSIVE METABOLIC PANEL WITH GFR - Abnormal; Notable for the following components:      Result Value   Glucose, Bld 106 (*)    All other components within normal limits  CBC     IMAGES:   EKG:   CV: Echo 09/18/2024 1. Left ventricular ejection  fraction, by estimation, is 60 to 65%. The  left ventricle has normal function. The left ventricle has no regional  wall motion abnormalities. There is moderate left ventricular hypertrophy.  Left ventricular diastolic  parameters are consistent with Grade I diastolic dysfunction (impaired  relaxation). The average left ventricular global longitudinal strain is  -18.7 %. The global longitudinal strain is normal.   2. Right ventricular systolic function is normal. The right ventricular  size is normal.   3. Left atrial size was mildly dilated.   4. The mitral valve is normal in structure. No evidence of mitral valve  regurgitation. No evidence of mitral stenosis.   5. The aortic valve was not well visualized. Aortic valve regurgitation  is mild. Mild aortic valve stenosis.   6. Aneurysm of the ascending aorta, measuring 41 mm.   7. The inferior vena cava is normal in size with greater than 50%  respiratory variability, suggesting right atrial pressure of 3 mmHg.   Myocardial Perfusion 12/16/20 Nuclear stress EF: 59%. There was no ST segment deviation noted during stress. No T wave inversion was noted during stress. The study is normal. This is a low risk study. The left ventricular ejection fraction is normal (55-65%).   1.  There are mildly reduced counts in the apex that improve on stress imaging with normal wall motion consistent with apical thinning artifact. 2.  Mildly reduced counts in the basal to mid inferior segments with normal wall motion consistent with diaphragm  attenuation. 3.  Overall, this is likely a normal study with no evidence of ischemia or infarction.  Artifacts mentioned above. 4.  Normal LV function, EF 59%. 5.  Average functional capacity (7:01 min:s; 8.5 METS). 6.  Normal heart rate and blood pressure response to exercise. 7.  This is a low risk study. Past Medical History:  Diagnosis Date   Abdominal pain 01/10/2024   12/2023  [name], [name]  (GI)ordered CT scan of the abdomen to evaluate the source of abd  pain, with a focus on the previous hernia repair site.   ?IBS - given IB Guard     Actinic keratosis    Acute sinusitis 06/21/2010   9/14, 10/19    Allergy    Anxiety 08/24/2017   Chronic  Worse in 10/18 Xanax  prn - d/c  Potential benefits of a long term benzodiazepines  use as well as potential risks  and complications were explained to the patient and were aknowledged. 11/18 Lexapro      Aortic atherosclerosis 08/16/2022   Cont on Repatha  injections, ASA     Aortic stenosis, moderate 07/26/2022   2023 on ECHO: Mild to moderate aortic stenosis.  Medical management.     Arthralgia 04/14/2017   2018 2019 Rheum consult; Gluten free trial [name] 12/[facility]o-medrol  IM Increase Tramadol  w/caution  Potential benefits of a long term opioids use as well as potential risks (i.e. addiction risk, apnea etc) and complications (i.e. Somnolence, constipation and others) were explained to the patient and were aknowledged.   Ascending aortic aneurysm 07/26/2022   2023 ECHO 4.1 cm     Asthmatic bronchitis 10/26/2022   Onset fall 2023 much worse since 10/2022 chest congestion, cough: was using a jack hammer on quartz - breathing dust...  Use a face mask, filters, face shield  Chest CT in 08/03/22  was OK.  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 Pravastatin  - pt stopped due to side effects 1/21 Zetia  - causing pain too Krill oil d/c.  Repatha  option discussed.  9/21 Vascepa , Zetia    Chest pain 12/07/2020   Chondrocalcinosis 07/06/2018   Chronic sinusitis 08/12/2013    Sinus congestion, SOB - better after surgery - nov 2014 9/14 worse CT IMPRESSION:  Minimal sinus mucosal thickening, primarily in the frontal recesses.  Small right maxillary mucous retention cyst. No CT evidence of acute  sinusitis.  Electronically Signed  By: Jama Hurst M.D.  On: 08/15/2013 09:19  Doxy x 4 wks   Colonic polyp    CONTACT DERMATITIS DUE TO SOLVENTS    Cough 12/13/2022   Chronic   Pulm ref  R/o ILD, pulm HTN etc. H/o recent rock dust exposure  Prom cough syr     DDD (  degenerative disc disease), cervical 07/06/2018   DDD (degenerative disc disease), lumbar 07/06/2018   and facet joint arthropathy   Dizzinesses 07/18/2017   9/18 I suggested a brain MRI to r/o acustic neuroma etc: he will think about it and check on cost ENT ref suggested   Dyslipidemia 06/21/2013   Chronic  Elev TG, low HDL, Gill LDL Pravastatin  - pt stopped due to side effects 1/21 Repatha  option discussed. On Zetia  - c/o side effeects 9/21 Vascepa , Zetia    Dyspnea 07/25/2012   related to eating shrimp   Essential hypertension 07/29/2021   Fatigue 06/09/2015   2016 no OSA  2018 much better (60%) off Reglan .    Folliculitis 11/19/2018   GERD 05/09/2008   Chronic  -- Protonix  5/18 We discussed his GERD/Barrett's treated at [facility] (Andre [name]). Reglan  was d/c'd due to CP. He was dx'd w/pancreatitis and started on Zenpep He had an abd CT, US , EGD and colonoscopy. He would like to have a 2nd opinion here - will refer   GERD (gastroesophageal reflux disease)    Hand eczema 07/07/2015   8/16 dyshidrotic eczema B    HAND PAIN    Heart murmur    HERPES, GENITAL NOS 05/09/2008   Qualifier: Diagnosis of   By:  Plotnikov MD, [name]     Replacing diagnoses that were inactivated after the 02/13/23 regulatory import     History of colonic polyps 05/09/2008   Qualifier: Diagnosis of   By: Garald MD, [name]     IMO SNOMED Dx Update Oct 2024     Hyperglycemia 06/09/2015   Mild     Hypogonadism in male 04/25/2018   Declined testosterone  Rx   Impacted cerumen of left ear 12/24/2020   Ingrowing toenail of left foot 07/25/2012   is resolved now 07-25-12   Insomnia 04/18/2022   6/23 Worse due to pain. Try Flexeril  at hs.   KNEE PAIN    Laryngopharyngeal reflux (LPR) 10/02/2018   Localized swelling, mass or lump of neck 09/05/2018   Low back pain 06/09/2015   Per Andre Ernie 3/17 - planning to see Andre Louis Body pains are much better (60%) off Reglan . Off Tramadol  2019 Rheum consult; Gluten free trial Tramadol   Potential benefits of a long term opioids use as well as potential risks (i.e. addiction risk, apnea etc) and complications (i.e. Somnolence, constipation and others) were explained to the patient and were aknowledged.   Myalgia due to statin 08/16/2022   Nausea 07/18/2017   Chronic w/dizziness I suggested a brain MRI to r/o acustic neuroma etc: he will think about it and check on cost 9/18   Neoplasm of uncertain behavior of skin    Neuromuscular disorder (HCC)    Neuropathy BLE   Non-recurrent unilateral inguinal hernia without obstruction or gangrene 08/28/2024   Surgery referral for R inguinal hernia - Andre Andre  Discussed  Info provided     OA (osteoarthritis) of knee    Obesity (BMI 30.0-34.9) 01/07/2021   ONYCHOMYCOSIS    OSA (obstructive sleep apnea) 08/21/2013   2014 no need for CPAP    Preop exam for internal medicine    Primary osteoarthritis of both hands 11/21/2008   Chronic and severe Tramadol   Potential benefits of a long term opioids use as well as potential risks (i.e. addiction risk, apnea etc) and complications (i.e. Somnolence, constipation and others) were explained to the  patient and were aknowledged.   10/18 Body pains are much better (60%) off  Reglan . 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 Xanax  prn - d/c  Potential benefits of a long term benzodiazepines  use as well as potential risks  and complications were explained to the patient and were aknowledged. Neurol ref - Andre Tat:   Hi   Umbilical hernia 05/24/2022   Relapsed Surgical ref   Upper respiratory infection 07/27/2018   9/19, 12/21   Well adult exam     Past Surgical History:  Procedure Laterality Date   COLONOSCOPY  04/24/2019   FUNCTIONAL ENDOSCOPIC SINUS SURGERY  2005   KNEE ARTHROSCOPY     Left   LEFT HEART CATH AND CORONARY ANGIOGRAPHY N/A 08/31/2017   Procedure: LEFT HEART CATH AND CORONARY ANGIOGRAPHY;  Surgeon: Mady Bruckner, MD;  Location: [facility];  Service: Cardiovascular;  Laterality: N/A;   TOTAL KNEE ARTHROPLASTY  07/31/2012   Procedure: TOTAL KNEE ARTHROPLASTY;  Surgeon: Lamar Collet, MD;  Location: [facility];  Service: Orthopedics;  Laterality: Left;   UMBILICAL HERNIA REPAIR  2011   UPPER GASTROINTESTINAL ENDOSCOPY  04/24/2019   WISDOM TOOTH EXTRACTION      MEDICATIONS:  acetaminophen  (TYLENOL ) 500 MG tablet   aspirin  81 MG tablet   Cyanocobalamin  (VITAMIN B-12) 2500 MCG SUBL   escitalopram  (LEXAPRO ) 10 MG tablet   Evolocumab  (REPATHA  SURECLICK) 140  MG/ML SOAJ   fluticasone  (FLONASE ) 50 MCG/ACT nasal spray   loratadine  (CLARITIN ) 10 MG tablet   metoprolol  tartrate (LOPRESSOR ) 100 MG tablet   Multiple Vitamin (MULTIVITAMIN WITH MINERALS) TABS tablet   nitroGLYCERIN  (NITROSTAT ) 0.4 MG SL tablet   pantoprazole  (PROTONIX ) 40 MG tablet   SALINE NASAL SPRAY NA   TURMERIC PO   valACYclovir  (VALTREX ) 500 MG tablet   VITAMIN D  PO   No current facility-administered medications for this encounter.    nitroGLYCERIN  (NITROSTAT ) SL tablet 0.8 mg      Harlene Hoots Ward, PA-C [facility] [phone]

## 2024-09-20 NOTE — Telephone Encounter (Signed)
 Pt calling to F/U  please advise

## 2024-09-20 NOTE — Telephone Encounter (Signed)
 Spoke with pt. He stated that his surgery is scheduled for Monday morning. Advised that his Echo and CT scans are in the computer for the surgeon to see and if they need any further information they can call the office early Monday morning.

## 2024-09-20 NOTE — Anesthesia Preprocedure Evaluation (Addendum)
 Anesthesia Evaluation  Patient identified by MRN, date of birth, ID band Patient awake    Reviewed: Allergy & Precautions, H&P , NPO status , Patient's Chart, lab work & pertinent test results  Airway Mallampati: III  TM Distance: >3 FB Neck ROM: Full    Dental no notable dental hx. (+) Teeth Intact, Dental Advisory Given   Pulmonary shortness of breath and with exertion, asthma , sleep apnea , former smoker   Pulmonary exam normal breath sounds clear to auscultation       Cardiovascular Exercise Tolerance: Good hypertension, Pt. on medications and Pt. on home beta blockers + CAD  + Valvular Problems/Murmurs AS  Rhythm:Regular Rate:Normal     Neuro/Psych   Anxiety     negative neurological ROS     GI/Hepatic Neg liver ROS,GERD  Medicated,,  Endo/Other  negative endocrine ROS    Renal/GU negative Renal ROS  negative genitourinary   Musculoskeletal  (+) Arthritis , Osteoarthritis,    Abdominal   Peds  Hematology negative hematology ROS (+)   Anesthesia Other Findings   Reproductive/Obstetrics negative OB ROS                              Anesthesia Physical Anesthesia Plan  ASA: 3  Anesthesia Plan: General   Post-op Pain Management: Tylenol  PO (pre-op)*   Induction: Intravenous  PONV Risk Score and Plan: 3 and Ondansetron  and Dexamethasone  Airway Management Planned: Oral ETT  Additional Equipment:   Intra-op Plan:   Post-operative Plan: Extubation in OR  Informed Consent: I have reviewed the patients History and Physical, chart, labs and discussed the procedure including the risks, benefits and alternatives for the proposed anesthesia with the patient or authorized representative who has indicated his/her understanding and acceptance.     Dental advisory given  Plan Discussed with: CRNA  Anesthesia Plan Comments: (See PAT note 09/19/2024)         Anesthesia  Quick Evaluation

## 2024-09-22 ENCOUNTER — Ambulatory Visit (HOSPITAL_COMMUNITY)
Admission: RE | Admit: 2024-09-22 | Discharge: 2024-09-22 | Disposition: A | Discharge status: Home / Self Care | Source: Ambulatory Visit | Attending: Cardiology | Admitting: Cardiology

## 2024-09-22 ENCOUNTER — Telehealth: Payer: Self-pay | Admitting: Cardiology

## 2024-09-22 ENCOUNTER — Other Ambulatory Visit: Payer: Self-pay | Admitting: Cardiology

## 2024-09-22 DIAGNOSIS — R931 Abnormal findings on diagnostic imaging of heart and coronary circulation: Secondary | ICD-10-CM | POA: Diagnosis not present

## 2024-09-22 NOTE — Telephone Encounter (Signed)
 Patient is scheduled for hernia surgery tomorrow, chart reviewed, echocardiogram showed only mild aortic insufficiency, preserved ejection fraction, coronary CT angio did not show any obstructive lesion, therefore, from cardiac standpoint of view she would be acceptable candidate for the surgery.

## 2024-09-23 ENCOUNTER — Encounter (HOSPITAL_COMMUNITY)
Admission: RE | Disposition: A | Discharge status: Home / Self Care | Payer: Self-pay | Source: Home / Self Care | Attending: General Surgery

## 2024-09-23 ENCOUNTER — Encounter (HOSPITAL_COMMUNITY): Payer: Self-pay | Admitting: General Surgery

## 2024-09-23 ENCOUNTER — Ambulatory Visit (HOSPITAL_COMMUNITY): Payer: Self-pay | Admitting: Anesthesiology

## 2024-09-23 ENCOUNTER — Other Ambulatory Visit: Payer: Self-pay

## 2024-09-23 ENCOUNTER — Ambulatory Visit (HOSPITAL_COMMUNITY): Payer: Self-pay | Admitting: Physician Assistant

## 2024-09-23 ENCOUNTER — Ambulatory Visit (HOSPITAL_COMMUNITY)
Admission: RE | Admit: 2024-09-23 | Discharge: 2024-09-23 | Disposition: A | Discharge status: Home / Self Care | Attending: General Surgery | Admitting: General Surgery

## 2024-09-23 DIAGNOSIS — K4090 Unilateral inguinal hernia, without obstruction or gangrene, not specified as recurrent: Secondary | ICD-10-CM | POA: Insufficient documentation

## 2024-09-23 DIAGNOSIS — I2510 Atherosclerotic heart disease of native coronary artery without angina pectoris: Secondary | ICD-10-CM | POA: Insufficient documentation

## 2024-09-23 DIAGNOSIS — K219 Gastro-esophageal reflux disease without esophagitis: Secondary | ICD-10-CM | POA: Diagnosis not present

## 2024-09-23 DIAGNOSIS — Z791 Long term (current) use of non-steroidal anti-inflammatories (NSAID): Secondary | ICD-10-CM | POA: Diagnosis not present

## 2024-09-23 DIAGNOSIS — M1990 Unspecified osteoarthritis, unspecified site: Secondary | ICD-10-CM | POA: Insufficient documentation

## 2024-09-23 DIAGNOSIS — G4730 Sleep apnea, unspecified: Secondary | ICD-10-CM | POA: Diagnosis not present

## 2024-09-23 DIAGNOSIS — D176 Benign lipomatous neoplasm of spermatic cord: Secondary | ICD-10-CM | POA: Insufficient documentation

## 2024-09-23 DIAGNOSIS — F419 Anxiety disorder, unspecified: Secondary | ICD-10-CM | POA: Insufficient documentation

## 2024-09-23 DIAGNOSIS — J45909 Unspecified asthma, uncomplicated: Secondary | ICD-10-CM | POA: Diagnosis not present

## 2024-09-23 DIAGNOSIS — K429 Umbilical hernia without obstruction or gangrene: Secondary | ICD-10-CM | POA: Diagnosis not present

## 2024-09-23 DIAGNOSIS — E785 Hyperlipidemia, unspecified: Secondary | ICD-10-CM | POA: Diagnosis not present

## 2024-09-23 DIAGNOSIS — Z79899 Other long term (current) drug therapy: Secondary | ICD-10-CM | POA: Insufficient documentation

## 2024-09-23 DIAGNOSIS — K439 Ventral hernia without obstruction or gangrene: Secondary | ICD-10-CM | POA: Diagnosis not present

## 2024-09-23 DIAGNOSIS — I10 Essential (primary) hypertension: Secondary | ICD-10-CM

## 2024-09-23 DIAGNOSIS — R0602 Shortness of breath: Secondary | ICD-10-CM | POA: Diagnosis not present

## 2024-09-23 DIAGNOSIS — Z87891 Personal history of nicotine dependence: Secondary | ICD-10-CM | POA: Insufficient documentation

## 2024-09-23 HISTORY — PX: XI ROBOTIC ASSISTED INGUINAL HERNIA REPAIR WITH MESH: SHX6706

## 2024-09-23 SURGERY — REPAIR, HERNIA, INGUINAL, ROBOT-ASSISTED, LAPAROSCOPIC, USING MESH
Anesthesia: General | Site: Abdomen | Laterality: Right

## 2024-09-23 MED ORDER — LIDOCAINE HCL (PF) 2 % IJ SOLN
INTRAMUSCULAR | Status: AC
  Filled 2024-09-23: qty 5

## 2024-09-23 MED ORDER — KETOROLAC TROMETHAMINE 15 MG/ML IJ SOLN: INTRAMUSCULAR | Status: DC | PRN

## 2024-09-23 MED ORDER — ROCURONIUM BROMIDE 10 MG/ML (PF) SYRINGE
PREFILLED_SYRINGE | INTRAVENOUS | Status: AC
  Filled 2024-09-23: qty 10

## 2024-09-23 MED ORDER — ROCURONIUM BROMIDE 100 MG/10ML IV SOLN
INTRAVENOUS | Status: DC | PRN
  Administered 2024-09-23 (×2): 10 mg via INTRAVENOUS
  Administered 2024-09-23: 60 mg via INTRAVENOUS
  Administered 2024-09-23 (×2): 20 mg via INTRAVENOUS

## 2024-09-23 MED ORDER — PROPOFOL 10 MG/ML IV BOLUS
INTRAVENOUS | Status: AC
  Filled 2024-09-23: qty 20

## 2024-09-23 MED ORDER — ACETAMINOPHEN 500 MG PO TABS
1000.0000 mg | ORAL_TABLET | ORAL | Status: AC
  Administered 2024-09-23: 1000 mg via ORAL
  Filled 2024-09-23: qty 2

## 2024-09-23 MED ORDER — LIDOCAINE HCL (CARDIAC) PF 100 MG/5ML IV SOSY
PREFILLED_SYRINGE | INTRAVENOUS | Status: DC | PRN
  Administered 2024-09-23: 60 mg via INTRATRACHEAL

## 2024-09-23 MED ORDER — FENTANYL CITRATE (PF) 100 MCG/2ML IJ SOLN
INTRAMUSCULAR | Status: AC
  Filled 2024-09-23: qty 2

## 2024-09-23 MED ORDER — SUGAMMADEX SODIUM 200 MG/2ML IV SOLN
INTRAVENOUS | Status: AC
  Filled 2024-09-23: qty 2

## 2024-09-23 MED ORDER — ORAL CARE MOUTH RINSE: 15.0000 mL | Freq: Once | OROMUCOSAL | Status: AC

## 2024-09-23 MED ORDER — PROPOFOL 10 MG/ML IV BOLUS
INTRAVENOUS | Status: DC | PRN
  Administered 2024-09-23: 130 mg via INTRAVENOUS

## 2024-09-23 MED ORDER — DEXMEDETOMIDINE HCL IN NACL 80 MCG/20ML IV SOLN
INTRAVENOUS | Status: AC
  Filled 2024-09-23: qty 20

## 2024-09-23 MED ORDER — DEXMEDETOMIDINE HCL IN NACL 80 MCG/20ML IV SOLN
INTRAVENOUS | Status: DC | PRN
  Administered 2024-09-23: 8 ug via INTRAVENOUS

## 2024-09-23 MED ORDER — LIDOCAINE HCL (PF) 2 % IJ SOLN
INTRAMUSCULAR | Status: AC
  Filled 2024-09-23: qty 10

## 2024-09-23 MED ORDER — ONDANSETRON HCL 4 MG/2ML IJ SOLN
INTRAMUSCULAR | Status: DC | PRN
  Administered 2024-09-23: 4 mg via INTRAVENOUS

## 2024-09-23 MED ORDER — CHLORHEXIDINE GLUCONATE 0.12 % MT SOLN
15.0000 mL | Freq: Once | OROMUCOSAL | Status: AC
  Administered 2024-09-23: 15 mL via OROMUCOSAL

## 2024-09-23 MED ORDER — ONDANSETRON HCL 4 MG/2ML IJ SOLN
INTRAMUSCULAR | Status: AC
  Filled 2024-09-23: qty 2

## 2024-09-23 MED ORDER — BUPIVACAINE LIPOSOME 1.3 % IJ SUSP: INTRAMUSCULAR | Status: DC | PRN

## 2024-09-23 MED ORDER — HYDROMORPHONE HCL 1 MG/ML IJ SOLN: 0.2500 mg | INTRAMUSCULAR | Status: DC | PRN

## 2024-09-23 MED ORDER — KETOROLAC TROMETHAMINE 30 MG/ML IJ SOLN
INTRAMUSCULAR | Status: AC
  Filled 2024-09-23: qty 1

## 2024-09-23 MED ORDER — CHLORHEXIDINE GLUCONATE CLOTH 2 % EX PADS: 6.0000 | MEDICATED_PAD | Freq: Once | CUTANEOUS | Status: DC

## 2024-09-23 MED ORDER — LACTATED RINGERS IV SOLN: INTRAVENOUS | Status: DC

## 2024-09-23 MED ORDER — KETOROLAC TROMETHAMINE 30 MG/ML IJ SOLN
INTRAMUSCULAR | Status: DC | PRN
  Administered 2024-09-23: 15 mg via INTRAVENOUS

## 2024-09-23 MED ORDER — FENTANYL CITRATE (PF) 100 MCG/2ML IJ SOLN
INTRAMUSCULAR | Status: DC | PRN
  Administered 2024-09-23 (×4): 50 ug via INTRAVENOUS

## 2024-09-23 MED ORDER — BUPIVACAINE-EPINEPHRINE (PF) 0.25% -1:200000 IJ SOLN
INTRAMUSCULAR | Status: DC | PRN
  Administered 2024-09-23: 30 mL

## 2024-09-23 MED ORDER — LIDOCAINE HCL (PF) 2 % IJ SOLN
INTRAMUSCULAR | Status: DC | PRN
  Administered 2024-09-23: 1.229 mg/kg/h via INTRADERMAL

## 2024-09-23 MED ORDER — ACETAMINOPHEN 500 MG PO TABS: 1000.0000 mg | ORAL_TABLET | Freq: Once | ORAL | Status: DC

## 2024-09-23 MED ORDER — BUPIVACAINE-EPINEPHRINE (PF) 0.25% -1:200000 IJ SOLN
INTRAMUSCULAR | Status: AC
  Filled 2024-09-23: qty 30

## 2024-09-23 MED ORDER — HYDRALAZINE HCL 20 MG/ML IJ SOLN
INTRAMUSCULAR | Status: DC | PRN
  Administered 2024-09-23: 4 mg via INTRAVENOUS

## 2024-09-23 MED ORDER — VANCOMYCIN HCL IN DEXTROSE 1-5 GM/200ML-% IV SOLN
1000.0000 mg | INTRAVENOUS | Status: AC
  Administered 2024-09-23: 1000 mg via INTRAVENOUS
  Filled 2024-09-23: qty 200

## 2024-09-23 MED ORDER — SUGAMMADEX SODIUM 200 MG/2ML IV SOLN
INTRAVENOUS | Status: DC | PRN
  Administered 2024-09-23: 200 mg via INTRAVENOUS

## 2024-09-23 MED ORDER — OXYCODONE HCL 5 MG PO TABS: 5.0000 mg | ORAL_TABLET | Freq: Four times a day (QID) | ORAL | 0 refills | Status: AC | PRN

## 2024-09-23 MED ORDER — HYDRALAZINE HCL 20 MG/ML IJ SOLN
INTRAMUSCULAR | Status: AC
  Filled 2024-09-23: qty 1

## 2024-09-23 MED ORDER — ACETAMINOPHEN 500 MG PO TABS: 1000.0000 mg | ORAL_TABLET | Freq: Three times a day (TID) | ORAL | Status: AC

## 2024-09-23 MED ORDER — DEXAMETHASONE SOD PHOSPHATE PF 10 MG/ML IJ SOLN
INTRAMUSCULAR | Status: DC | PRN
  Administered 2024-09-23: 8 mg via INTRAVENOUS

## 2024-09-23 SURGICAL SUPPLY — 52 items
BAG COUNTER SPONGE SURGICOUNT (BAG) IMPLANT
BLADE SURG SZ11 CARB STEEL (BLADE) ×1 IMPLANT
CHLORAPREP W/TINT 26 (MISCELLANEOUS) ×2 IMPLANT
COVER MAYO STAND STRL (DRAPES) IMPLANT
COVER SURGICAL LIGHT HANDLE (MISCELLANEOUS) ×2 IMPLANT
COVER TIP SHEARS 8 DVNC (MISCELLANEOUS) ×2 IMPLANT
DRAPE ARM DVNC X/XI (DISPOSABLE) ×3 IMPLANT
DRAPE COLUMN DVNC XI (DISPOSABLE) ×1 IMPLANT
DRAPE CV SPLIT W-CLR ANES SCRN (DRAPES) ×1 IMPLANT
DRAPE PERI GROIN 82X75IN TIB (DRAPES) ×1 IMPLANT
DRAPE UTILITY XL STRL (DRAPES) ×1 IMPLANT
DRIVER NDL MEGA SUTCUT DVNCXI (INSTRUMENTS) ×1 IMPLANT
DRIVER NDLE MEGA SUTCUT DVNCXI (INSTRUMENTS) ×1 IMPLANT
DRSG TEGADERM 2-3/8X2-3/4 SM (GAUZE/BANDAGES/DRESSINGS) ×3 IMPLANT
DRSG TEGADERM 6X8 (GAUZE/BANDAGES/DRESSINGS) IMPLANT
ELECT REM PT RETURN 15FT ADLT (MISCELLANEOUS) ×1 IMPLANT
FORCEPS BPLR FENES DVNC XI (FORCEP) ×1 IMPLANT
GAUZE 4X4 16PLY ~~LOC~~+RFID DBL (SPONGE) ×1 IMPLANT
GAUZE SPONGE 2X2 8PLY STRL LF (GAUZE/BANDAGES/DRESSINGS) ×1 IMPLANT
GAUZE SPONGE 4X4 12PLY STRL (GAUZE/BANDAGES/DRESSINGS) IMPLANT
GLOVE BIO SURGEON STRL SZ7.5 (GLOVE) ×2 IMPLANT
GLOVE INDICATOR 8.0 STRL GRN (GLOVE) ×1 IMPLANT
GOWN STRL REUS W/ TWL XL LVL3 (GOWN DISPOSABLE) ×4 IMPLANT
GRASPER SUT TROCAR 14GX15 (MISCELLANEOUS) IMPLANT
IRRIGATION SUCT STRKRFLW 2 WTP (MISCELLANEOUS) IMPLANT
KIT BASIN OR (CUSTOM PROCEDURE TRAY) ×1 IMPLANT
KIT TURNOVER KIT A (KITS) ×2 IMPLANT
MARKER SKIN DUAL TIP RULER LAB (MISCELLANEOUS) ×1 IMPLANT
MESH 3DMAX MID 4X6 RT LRG (Mesh General) IMPLANT
NDL HYPO 22X1.5 SAFETY MO (MISCELLANEOUS) ×2 IMPLANT
NEEDLE HYPO 22X1.5 SAFETY MO (MISCELLANEOUS) ×1 IMPLANT
OBTURATOR OPTICALSTD 8 DVNC (TROCAR) ×1 IMPLANT
PACK BASIC VI WITH GOWN DISP (CUSTOM PROCEDURE TRAY) ×1 IMPLANT
PAD POSITIONING PINK XL (MISCELLANEOUS) ×2 IMPLANT
SCISSORS LAP 5X35 DISP (ENDOMECHANICALS) IMPLANT
SCISSORS MNPLR CVD DVNC XI (INSTRUMENTS) ×1 IMPLANT
SEAL UNIV 5-12 XI (MISCELLANEOUS) ×3 IMPLANT
SOLUTION ANTFG W/FOAM PAD STRL (MISCELLANEOUS) ×2 IMPLANT
SOLUTION ELECTROSURG ANTI STCK (MISCELLANEOUS) ×1 IMPLANT
SPIKE FLUID TRANSFER (MISCELLANEOUS) ×1 IMPLANT
STRIP CLOSURE SKIN 1/2X4 (GAUZE/BANDAGES/DRESSINGS) ×1 IMPLANT
SUT MNCRL AB 4-0 PS2 18 (SUTURE) ×1 IMPLANT
SUT VIC AB 2-0 SH 27X BRD (SUTURE) IMPLANT
SUT VIC AB 3-0 SH 27XBRD (SUTURE) ×4 IMPLANT
SUT VICRYL 0 UR6 27IN ABS (SUTURE) IMPLANT
SUT VLOC 3-0 9IN GRN (SUTURE) ×1 IMPLANT
SYR 20ML LL LF (SYRINGE) ×2 IMPLANT
TAPE STRIPS DRAPE STRL (GAUZE/BANDAGES/DRESSINGS) IMPLANT
TOWEL GREEN STERILE FF (TOWEL DISPOSABLE) ×1 IMPLANT
TOWEL OR DSP ST BLU DLX 10/PK (DISPOSABLE) ×2 IMPLANT
TROCAR XCEL NON-BLD 5MMX100MML (ENDOMECHANICALS) ×1 IMPLANT
TUBING INSUFFLATION 10FT LAP (TUBING) ×1 IMPLANT

## 2024-09-23 NOTE — Op Note (Signed)
 PREOPERATIVE DIAGNOSIS: right inguinal hernia.    POSTOPERATIVE DIAGNOSIS: right  indirect  inguinal and right lower quadrant spigelian hernia   PROCEDURE: Robotic/XI repair of right indirect and right spigelian inguinal hernias with  mesh (rTAPP).  Laparoscopic bilateral TAP block   SURGEON: Camellia HERO. Tanda, MD    ASSISTANT SURGEON: None.    ANESTHESIA: General plus local consisting of 0.25% Marcaine  with epi    ESTIMATED BLOOD LOSS: Minimal.    FINDINGS: The patient had a right indirect inguinal hernia.  He also was found to have a defect superior lateral to the inguinal canal consistent with a spigelian hernia.  Both were repaired using the same using Bard 3d midweight right mesh    SPECIMEN: Cord lipoma   INDICATIONS FOR PROCEDURE: 73yo presented for repair of a symptomatic inguinal hernia.  He also complained of an intermittent bulge in his right lower quadrant.  On physical exam we definitely appreciated a right inguinal hernia but I could not appreciate a bulge with Valsalva in the right lower quadrant.  I recommended a minimally invasive approach so we could rule out some type of occult abdominal wall hernia. The risks and benefits including but not limited to bleeding, infection, chronic inguinal pain, nerve entrapment, hernia recurrence, mesh complications, hematoma formation, urinary retention, injury to the testicles or the ovaries, numbness in the groin, blood clots, injury to the surrounding structures, and anesthesia risk was discussed with the patient.   DESCRIPTION OF PROCEDURE: After obtaining verbal consent the patient was then taken back to the operating room, placed  supine on the operating room table. General endotracheal anesthesia was  established. The patient had emptied their bladder prior to going back to  the operating room.  Sequential compression devices were placed. The  abdomen and groin were prepped and draped in the usual standard surgical  fashion with  ChloraPrep. The patient received oral Tylenol  preoperatively as well as IV  antibiotics prior to the incision. A surgical time-out was performed.  Local was infiltrated at the base of the umbilicus.     Optical entry was made using the Optiview technique in the left midclavicular line about 8 cm lateral to the umbilicus a few centimeters below the left subcostal margin.  Using a 0 degree 5 mm laparoscope through a 5 mm trocar I was able to advance the laparoscope carefully through all layers of the abdominal wall and carefully entered the abdominal cavity.  Pneumoperitoneum was smoothly established up to a patient pressure of 15 mmHg without any change in patient vital signs.  There is no evidence of injury to surrounding structures.  A bilateral laparoscopic tap block was performed for postoperative pain relief.  The inguinal areas were inspected and there was no evidence of a contralateral hernia.  He had evidence of a right indirect inguinal hernia.  Unfortunately his bladder was rather full so the circulating nurse had to place a catheter to decompress the bladder.  Patient was placed in Trendelenburg position.  A robotic 8 mm trocar was placed in the supraumbilical position about 18 cm from the pubic bone.  An additional 8 mm robotic trocar was placed in the right lateral abdominal wall.  The optical entry trocar was exchanged for an 8 mm robotic trocar.  A large piece of right Bard 3D midweight  mesh were placed through the robotic trocar into the abdominal cavity.  I went ahead and placed two  3-0 Vicryl sutures  on SH into the abdomen off to the  side.  I then placed one 3-0 absorbable V-Loc sutures through the [facility] trocar into the abdomen off to the side.    We then deployed the robot for pelvic surgery. The robot was docked.  Robotic laparoscope was placed through the supraumbilical trocar and the anatomy was targeted.  The other arms were then connected to the trochars.  A pair of MCS  scissors was placed through the right trocar and a fenestrated bipolar through the left trocar all under direct visualization.  I then scrubbed out and went to the robotic console.    I then made incision along the peritoneum on the right, starting 2 inches above the anterior superior iliac spine and caring it medial toward the median umbilical ligament in a lazy S configuration using MCS scissors with electrocautery. The peritoneal flap was then gently dissected downward from the anterior abdominal wall taking care not to  injure the inferior epigastric vessels.  Taking down the peritoneal flap I identified a defects above the inguinal canal.  It was above and lateral to it.  It was probably about 1 cm above the orifice to the inguinal canal at the 1 o'clock position.  It was a small defect probably no bigger than 1-1/2 cm but it was consistent with a spigelian hernia.  Continued the dissection medially and the pubic bone was identified.  Dissection continued about 2 cm below the level of the pubic bone.  There is no evidence of obturator or femoral canal hernias.  The testicular vessels were identified.  Using traction and counter traction, I reduced the sac in  its entirety.  The medial umbilical ligament was quite pronounced.  the testicular vessels had been identified and preserved. The vas deferens was identified and preserved, and the hernia sac was stripped from those to  surrounding structures.  I did remove a small cord lipoma and placed it to the side.    I then went about creating a large pocket by lifting the peritoneum of the pelvic floor. I took great care not to injure the iliac vessels.      I then obtained the previously placed piece of Bard large 3D right max mesh for the right groin and placed it into the inguinal area.   half of it covered medial  to the inferior epigastric vessels and half of it lateral to the  inferior epigastric vessels. The defect was well  covered with the mesh.   The mesh also covered the defect superior to the inguinal canal.  I then secured the mesh to Cooper's ligament with an interrupted 3-0 Vicryl suture.  I placed an additional suture superior medially along the edge of the mesh medial to the inferior gastric vessel.  I placed a 3 Vicryl suture laterally along the superior lateral edge of the mesh lateral to the inferior epigastric vessels.  I then closed the peritoneal flap with a running 3-0 Vicryl V-Loc.  The mesh was well covered.  There was a defect in the peritoneal flap and it was reapproximated with an interrupted 3-0 Vicryl suture..  The mesh was flat.  It had not curled up.   The surgical robot was undocked and moved away from the OR table.  I scrubbed back in.    we then placed a laparoscopic needle driver and the three sutures that had been placed in the abdomen at the begin the case were removed with a cord lipoma.  There was no evidence of injury to surrounding structures. Pneumoperitoneum was  released, and the remaining trocars were removed. All skin incisions  were closed with a 4-0 Monocryl in a subcuticular fashion followed by application of Steri-Strips and sterile bandages. All needle, instrument, and sponge counts  were correct x2.   There are no immediate complications.  Foley catheter was removed. The patient tolerated the procedure well. The patient was extubated and taken to the  recovery room in stable condition.  Camellia HERO. Tanda, MD, FACS General, Bariatric, & Minimally Invasive Surgery [facility],  [facility]

## 2024-09-23 NOTE — Transfer of Care (Signed)
 Immediate Anesthesia Transfer of Care Note  Patient: [name]  Procedure(s) Performed: REPAIR, HERNIA, INGUINAL, ROBOT-ASSISTED, LAPAROSCOPIC, USING MESH (Right: Abdomen)  Patient Location: PACU  Anesthesia Type:General  Level of Consciousness: awake, alert , and oriented  Airway & Oxygen Therapy: Patient Spontanous Breathing  Post-op Assessment: Report given to RN and Post -op Vital signs reviewed and stable  Post vital signs: Reviewed and stable  Last Vitals:  Vitals Value Taken Time  BP    Temp    Pulse    Resp    SpO2      Last Pain:  Vitals:   09/23/24 0654  TempSrc: Oral  PainSc:       Patients Stated Pain Goal: 4 (09/23/24 0645)  Complications: No notable events documented.

## 2024-09-23 NOTE — Anesthesia Procedure Notes (Addendum)
 Procedure Name: Intubation Date/Time: 09/23/2024 8:09 AM  Performed by: Windle Rush, RNPre-anesthesia Checklist: Patient identified, Emergency Drugs available, Suction available and Patient being monitored Patient Re-evaluated:Patient Re-evaluated prior to induction Oxygen Delivery Method: Circle system utilized Preoxygenation: Pre-oxygenation with 100% oxygen Induction Type: IV induction Ventilation: Oral airway inserted - appropriate to patient size and Two handed mask ventilation required Laryngoscope Size: Mac and 4 Grade View: Grade II Tube type: Oral Tube size: 7.5 mm Number of attempts: 1 Airway Equipment and Method: Stylet Placement Confirmation: ETT inserted through vocal cords under direct vision, positive ETCO2 and breath sounds checked- equal and bilateral Secured at: 23 cm Tube secured with: Tape Dental Injury: Teeth and Oropharynx as per pre-operative assessment

## 2024-09-23 NOTE — Discharge Instructions (Signed)
LAPAROSCOPIC/ROBOTIC SURGERY: POST OP INSTRUCTIONS Always review your discharge instruction sheet given to you by the facility where your surgery was performed. IF YOU HAVE DISABILITY OR FAMILY LEAVE FORMS, YOU MUST BRING THEM TO THE OFFICE FOR PROCESSING.   DO NOT GIVE THEM TO YOUR DOCTOR.  PAIN CONTROL  First take acetaminophen (Tylenol) AND/or ibuprofen (Advil) to control your pain after surgery.  Follow directions on package.  Taking acetaminophen (Tylenol) and/or ibuprofen (Advil) regularly after surgery will help to control your pain and lower the amount of prescription pain medication you may need.  You should not take more than 3,000 mg (3 grams) of acetaminophen (Tylenol) in 24 hours.  You should not take ibuprofen (Advil), aleve, motrin, naprosyn or other NSAIDS if you have a history of stomach ulcers or chronic kidney disease.  A prescription for pain medication may be given to you upon discharge.  Take your pain medication as prescribed, if you still have uncontrolled pain after taking acetaminophen (Tylenol) or ibuprofen (Advil). Use ice packs to help control pain. If you need a refill on your pain medication, please contact your pharmacy.  They will contact our office to request authorization. Prescriptions will not be filled after 5pm or on week-ends.  HOME MEDICATIONS Take your usually prescribed medications unless otherwise directed.  DIET You should follow a light diet the first few days after arrival home.  Be sure to include lots of fluids daily. Avoid fatty, fried foods.   CONSTIPATION It is common to experience some constipation after surgery and if you are taking pain medication.  Increasing fluid intake and taking a stool softener (such as Colace) will usually help or prevent this problem from occurring.  A mild laxative (Milk of Magnesia or Miralax) should be taken according to package instructions if there are no bowel movements after 48 hours.  WOUND/INCISION  CARE Most patients will experience some swelling and bruising in the area of the incisions.  Ice packs will help.  Swelling and bruising can take several days to resolve.  Unless discharge instructions indicate otherwise, follow guidelines below  STERI-STRIPS - you may remove your outer bandages 48 hours after surgery, and you may shower at that time.  You have steri-strips (small skin tapes) in place directly over the incision.  These strips should be left on the skin for 7-10 days.   DERMABOND/SKIN GLUE - you may shower in 24 hours.  The glue will flake off over the next 2-3 weeks. Any sutures or staples will be removed at the office during your follow-up visit.  ACTIVITIES You may resume regular (light) daily activities beginning the next day--such as daily self-care, walking, climbing stairs--gradually increasing activities as tolerated.  You may have sexual intercourse when it is comfortable.  Refrain from any heavy lifting or straining until approved by your doctor. You may drive when you are no longer taking prescription pain medication, you can comfortably wear a seatbelt, and you can safely maneuver your car and apply brakes.  FOLLOW-UP You should see your doctor in the office for a follow-up appointment approximately 2-3 weeks after your surgery.  You should have been given your post-op/follow-up appointment when your surgery was scheduled.  If you did not receive a post-op/follow-up appointment, make sure that you call for this appointment within a day or two after you arrive home to insure a convenient appointment time.  OTHER INSTRUCTIONS   WHEN TO CALL YOUR DOCTOR: Fever over 101.0 Inability to urinate Continued bleeding from incision. Increased pain,  redness, or drainage from the incision. Increasing abdominal pain  The clinic staff is available to answer your questions during regular business hours.  Please don't hesitate to call and ask to speak to one of the nurses for  clinical concerns.  If you have a medical emergency, go to the nearest emergency room or call 911.  A surgeon from [facility] is always on call at the hospital. [address], [address], [place], [place]  [address] ? [address] [address], [place], [place]   [address] [phone] ? [phone] ? FAX [phone] Web site: [email or web address]

## 2024-09-23 NOTE — H&P (Signed)
 REFERRING PHYSICIAN: Plotnikov, Karlynn GAILS, MD  PROVIDER: Brande Uncapher DARALYN BLUSH, MD  MRN: [id number] DOB: 1951-10-27 DATE OF ENCOUNTER: 09/12/2024  Subjective  Chief Complaint: NEW PROBLEM ( NON-RECURRENT UNILATERAL INGUINAL HERNIA )   Reason for consult: [name] is a 73 y.o. male who is seen today as an office consultation at the request of Dr. Garald for evaluation of NEW PROBLEM ( NON-RECURRENT UNILATERAL INGUINAL HERNIA ) .  History of Present Illness [name] is a 73 year old male who presents with right groin discomfort and swelling.  He has experienced discomfort in his right groin area for several years, initially attributing it to potential hip problems or strain from mountain biking. A significant increase in swelling in the right groin area was noted a couple of months ago. He describes a 'marble knot' in the area, with swelling that worsens with activities such as coughing or sneezing. Approximately three weeks ago, the swelling suddenly increased to the size of a golf ball while he was eating and sitting in a recliner.  He has episodes of nausea and a history of stomach problems. Bowel movements occur primarily in the morning, about three to four times, but he finds it difficult to pass stool. He has been taking medication to aid with bowel movements, which has provided some relief. A CT scan performed by his gastroenterologist indicated irritable bowel syndrome, and he was prescribed medication that provided some relief but did not completely resolve his symptoms.  He has a history of umbilical hernia repair and reports a small knot near his belly button, present for about three years. He experiences frequent nighttime urination, approximately every hour and a half, and describes a 'weird' sensation during urination.  He has a history of taking various medications, including tramadol , meloxicam , Baypex, Flonase , and uretidine, but has stopped these medications after  changing his diet and losing about thirty pounds. He occasionally uses marijuana to manage withdrawal symptoms from previous medications.    Review of Systems: A complete review of systems was obtained from the patient. I have reviewed this information and discussed as appropriate with the patient. See HPI as well for other ROS.  ROS  Medical History: Past Medical History: Diagnosis Date GERD (gastroesophageal reflux disease) Hyperlipidemia  Patient Active Problem List Diagnosis CAD (coronary artery disease) Dyslipidemia Gastroesophageal reflux disease OA (osteoarthritis) of knee Non-recurrent unilateral inguinal hernia without obstruction or gangrene  Past Surgical History: Procedure Laterality Date HERNIA REPAIR left knee replacement   Allergies Allergen Reactions Penicillins Other (See Comments) Convulsions Has patient had a PCN reaction causing immediate rash, facial/tongue/throat swelling, SOB or lightheadedness with hypotension: No Has patient had a PCN reaction causing severe rash involving mucus membranes or skin necrosis: No Has patient had a PCN reaction that required hospitalization: Yes Has patient had a PCN reaction occurring within the last 10 years: No If all of the above answers are NO, then may proceed with Cephalosporin use.  Current Outpatient Medications on File Prior to Visit Medication Sig Dispense Refill escitalopram  oxalate (LEXAPRO ) 10 MG tablet Take 1 tablet by mouth once daily ezetimibe  (ZETIA ) 10 mg tablet Take by mouth meloxicam  (MOBIC ) 7.5 MG tablet Take 2 tablets by mouth once daily NEXLETOL  180 mg Tab Take by mouth nitroGLYcerin  (NITROSTAT ) 0.4 MG SL tablet Place 0.4 mg under the tongue every 5 (five) minutes as needed pantoprazole  (PROTONIX ) 40 MG DR tablet Take 1 tablet by mouth 2 (two) times daily traMADoL  (ULTRAM ) 50 mg tablet TAKE 1 TO 2 TABLETS BY  MOUTH THREE TIMES DAILY AS NEEDED valACYclovir  (VALTREX ) 500 MG tablet Take 1  tablet by mouth once daily  No current facility-administered medications on file prior to visit.  Family History Problem Relation Age of Onset Skin cancer Father Hyperlipidemia (Elevated cholesterol) Father Coronary Artery Disease (Blocked arteries around heart) Father Coronary Artery Disease (Blocked arteries around heart) Brother Hyperlipidemia (Elevated cholesterol) Brother   Social History  Tobacco Use Smoking Status Never Smokeless Tobacco Not on file   Social History  Socioeconomic History Marital status: Married Tobacco Use Smoking status: Never Vaping Use Vaping status: Unknown Substance and Sexual Activity Alcohol use: Yes Comment: occasionally Drug use: Never  Social Drivers of Catering Manager Strain: High Risk (02/12/2024) Received from [facility] Overall Financial Resource Strain (CARDIA) Difficulty of Paying Living Expenses: Hard Food Insecurity: No Food Insecurity (02/12/2024) Received from [facility] Hunger Vital Sign Within the past 12 months, you worried that your food would run out before you got the money to buy more.: Never true Within the past 12 months, the food you bought just didn't last and you didn't have money to get more.: Never true Transportation Needs: No Transportation Needs (02/12/2024) Received from [facility] - Transportation Lack of Transportation (Medical): No Lack of Transportation (Non-Medical): No Physical Activity: Sufficiently Active (02/12/2024) Received from [facility] Exercise Vital Sign On average, how many days per week do you engage in moderate to strenuous exercise (like a brisk walk)?: 5 days On average, how many minutes do you engage in exercise at this level?: 60 min Stress: No Stress Concern Present (02/12/2024) Received from [facility] - Occupational Stress Questionnaire Feeling of Stress : Not at all Social Connections: Moderately Isolated  (02/12/2024) Received from [facility] Social Connection and Isolation Panel In a typical week, how many times do you talk on the phone with family, friends, or neighbors?: Three times a week How often do you get together with friends or relatives?: Once a week How often do you attend church or religious services?: Never Do you belong to any clubs or organizations such as church groups, unions, fraternal or athletic groups, or school groups?: No How often do you attend meetings of the clubs or organizations you belong to?: Never Are you married, widowed, divorced, separated, never married, or living with a partner?: Married Housing Stability: Unknown (09/12/2024) Housing Stability Vital Sign Homeless in the Last Year: No  Objective:  Vitals: 09/12/24 0852 Pulse: 82 Resp: 16 Temp: 36.7 C (98 F) SpO2: 98% Weight: 97 kg (213 lb 12.8 oz) Height: 182.9 cm (6') PainSc: 5  Body mass index is 29 kg/m.  PE Chaperone note: A chaperone was included for sensitive portions of the exam- staff member name: laura  Constitutional: NAD; conversant; no deformities Eyes: Moist conjunctiva; no lid lag; anicteric; PERRL Neck: Trachea midline; no thyromegaly Lungs: Normal respiratory effort; no tactile fremitus CV: RRR; no palpable thrills; no pitting edema GI: Abd small old for umbilical incision. Upper midline diastases. small fascial defect at the umbilicus probably less than 1 cm; no palpable hepatosplenomegaly; palpable defect in the right lower quadrant supine or standing with and without Valsalva maneuvers GU: Palpable bulge in right groin, increases with Valsalva. No bulge with Valsalva in left groin MSK: Normal gait; no clubbing/cyanosis Psychiatric: Appropriate affect; alert and oriented x3 Lymphatic: No palpable cervical or axillary lymphadenopathy Skin: No rash, lesions or jaundice  Labs, Imaging and Diagnostic Testing: Pcp note 08/28/24  CT abdomen pelvis September 20, 2023  Comprehensive metabolic panel, lipid panel, hemoglobin A1c July 10, 2024  Cardiac stress test December 16, 2020 -normal  My office note 2023 Assessment and Plan:   Diagnoses and all orders for this visit:  Non-recurrent unilateral inguinal hernia without obstruction or gangrene  Coronary artery disease involving native coronary artery of native heart without angina pectoris  Primary osteoarthritis of knee, unspecified laterality  Recurrent umbilical hernia    Assessment & Plan Right inguinal hernia Confirmed with physical examination. Symptoms include swelling, discomfort, and a palpable lump in the right groin, exacerbated by coughing or sneezing. Risk of incarceration and strangulation discussed, though rare (1% risk). Surgical repair recommended due to discomfort and potential complications. Two surgical options discussed: open repair with mesh placement and robotic-assisted repair. Robotic approach preferred for comprehensive evaluation and repair of potential additional hernias. Risks of surgery include recurrence (5-8% lifetime risk), injury to nearby structures, infection, and nerve damage. Benefits include resolution of symptoms and prevention of complications. Robotic-assisted laparoscopic repair planned per pt request. - Scheduled robotic-assisted laparoscopic hernia repair. - Instructed on post-operative care: no heavy lifting for one month, use of Tylenol  and ibuprofen for pain management, and monitoring for urinary retention post-surgery. - Arranged for surgery scheduler to set date and time for procedure.  We discussed the etiology of inguinal hernias. We discussed the signs & symptoms of incarceration & strangulation. We discussed non-operative and operative management. We discussed open and minimally invasive approaches (laparoscopic/robotic)  The patient has elected robotic repair of right inguinal hernia with mesh  I described the procedure in detail. The  patient was given educational material. We discussed the risks and benefits including but not limited to bleeding, infection, chronic inguinal pain, nerve entrapment, hernia recurrence, mesh complications, hematoma formation, urinary retention, injury to the testicle, numbness in the groin, blood clots, injury to the surrounding structures, and anesthesia risk. We also discussed the typical post operative recovery course, including no heavy lifting for 4-6 weeks. I explained that the likelihood of improvement of their symptoms is good  Right now we are leaning toward leaving the fascial defect at the umbilicus alone since it is so small.  This patient encounter took 31 minutes today to perform the following: take history, perform exam, review outside records, interpret imaging, counsel the patient on their diagnosis and document encounter, findings & plan in the EHR  No follow-ups on file.  This note has been created using automated tools and reviewed for accuracy by Dashaun Onstott MCADAMS Smokey Melott.  Tametria Aho DARALYN BLUSH, MD General, Minimally Invasive, & Bariatric Surgery     Electronically signed by Blush Camellia Daralyn, MD at 09/12/2024 9:22 AM EDT

## 2024-09-23 NOTE — Anesthesia Postprocedure Evaluation (Signed)
 Anesthesia Post Note  Patient: [name]  Procedure(s) Performed: REPAIR, HERNIA, INGUINAL, ROBOT-ASSISTED, LAPAROSCOPIC, USING MESH (Right: Abdomen)     Patient location during evaluation: PACU Anesthesia Type: General Level of consciousness: awake and alert Pain management: pain level controlled Vital Signs Assessment: post-procedure vital signs reviewed and stable Respiratory status: spontaneous breathing, nonlabored ventilation and respiratory function stable Cardiovascular status: blood pressure returned to baseline and stable Postop Assessment: no apparent nausea or vomiting Anesthetic complications: no   No notable events documented.  Last Vitals:  Vitals:   09/23/24 0654 09/23/24 1034  BP: 133/75 138/76  Pulse: 65 70  Resp: 18 20  Temp: 37.1 C 36.6 C  SpO2: 96% 95%    Last Pain:  Vitals:   09/23/24 1034  TempSrc:   PainSc: 2                  Elfego Giammarino,W. EDMOND

## 2024-09-24 ENCOUNTER — Encounter (HOSPITAL_COMMUNITY): Payer: Self-pay | Admitting: General Surgery

## 2024-10-01 NOTE — Telephone Encounter (Signed)
 Orders placed.

## 2024-10-01 NOTE — Telephone Encounter (Signed)
-----   Message from Jennifer SAUNDERS Revankar sent at 09/25/2024 11:15 AM EST ----- Nonobstructive disease.  Diet and exercise.  Would like to get him in for a Chem-7 liver lipid check.  If he has any symptoms of chest pain he should use nitroglycerin  and if it does not help go to  the ER.  I will see him in follow-up in the next month or 2.  Copy primary Jennifer SAUNDERS Crape, MD 09/25/2024 11:15 AM  ----- Message ----- From: Interface, Rad Results In Sent: 09/22/2024  12:08 PM EST To: Jennifer SAUNDERS Crape, MD

## 2024-10-18 ENCOUNTER — Other Ambulatory Visit: Payer: Self-pay | Admitting: Cardiology

## 2024-10-18 DIAGNOSIS — I700 Atherosclerosis of aorta: Secondary | ICD-10-CM

## 2024-10-18 DIAGNOSIS — E785 Hyperlipidemia, unspecified: Secondary | ICD-10-CM

## 2024-10-18 DIAGNOSIS — I25119 Atherosclerotic heart disease of native coronary artery with unspecified angina pectoris: Secondary | ICD-10-CM

## 2024-10-30 ENCOUNTER — Other Ambulatory Visit: Payer: Self-pay | Admitting: Internal Medicine

## 2024-11-21 ENCOUNTER — Encounter: Payer: Self-pay | Admitting: Cardiology

## 2024-12-17 ENCOUNTER — Other Ambulatory Visit: Payer: Self-pay | Admitting: Internal Medicine

## 2025-01-14 ENCOUNTER — Ambulatory Visit: Admitting: Internal Medicine

## 2025-01-15 ENCOUNTER — Ambulatory Visit: Admitting: Internal Medicine

## 2025-02-12 ENCOUNTER — Ambulatory Visit

## 2025-02-25 ENCOUNTER — Ambulatory Visit
